# Patient Record
Sex: Male | Born: 1940 | Race: White | Hispanic: No | Marital: Married | Smoking: Former smoker
Health system: Southern US, Community
[De-identification: ages and names within clinical notes are randomized; demographics above are authoritative.]

## PROBLEM LIST (undated history)

## (undated) DIAGNOSIS — F329 Major depressive disorder, single episode, unspecified: Secondary | ICD-10-CM

## (undated) DIAGNOSIS — I6529 Occlusion and stenosis of unspecified carotid artery: Secondary | ICD-10-CM

## (undated) DIAGNOSIS — Z9289 Personal history of other medical treatment: Secondary | ICD-10-CM

## (undated) DIAGNOSIS — R011 Cardiac murmur, unspecified: Secondary | ICD-10-CM

## (undated) DIAGNOSIS — N200 Calculus of kidney: Secondary | ICD-10-CM

## (undated) DIAGNOSIS — S060XAA Concussion with loss of consciousness status unknown, initial encounter: Secondary | ICD-10-CM

## (undated) DIAGNOSIS — K439 Ventral hernia without obstruction or gangrene: Secondary | ICD-10-CM

## (undated) DIAGNOSIS — I480 Paroxysmal atrial fibrillation: Secondary | ICD-10-CM

## (undated) DIAGNOSIS — H9190 Unspecified hearing loss, unspecified ear: Secondary | ICD-10-CM

## (undated) DIAGNOSIS — I10 Essential (primary) hypertension: Secondary | ICD-10-CM

## (undated) DIAGNOSIS — D649 Anemia, unspecified: Secondary | ICD-10-CM

## (undated) DIAGNOSIS — E785 Hyperlipidemia, unspecified: Secondary | ICD-10-CM

## (undated) DIAGNOSIS — M1990 Unspecified osteoarthritis, unspecified site: Secondary | ICD-10-CM

## (undated) DIAGNOSIS — Z973 Presence of spectacles and contact lenses: Secondary | ICD-10-CM

## (undated) DIAGNOSIS — F32A Depression, unspecified: Secondary | ICD-10-CM

## (undated) DIAGNOSIS — K219 Gastro-esophageal reflux disease without esophagitis: Secondary | ICD-10-CM

## (undated) DIAGNOSIS — S060X9A Concussion with loss of consciousness of unspecified duration, initial encounter: Secondary | ICD-10-CM

## (undated) DIAGNOSIS — K649 Unspecified hemorrhoids: Secondary | ICD-10-CM

## (undated) DIAGNOSIS — Z972 Presence of dental prosthetic device (complete) (partial): Secondary | ICD-10-CM

## (undated) DIAGNOSIS — I219 Acute myocardial infarction, unspecified: Secondary | ICD-10-CM

## (undated) DIAGNOSIS — F0390 Unspecified dementia without behavioral disturbance: Secondary | ICD-10-CM

## (undated) DIAGNOSIS — I5032 Chronic diastolic (congestive) heart failure: Secondary | ICD-10-CM

## (undated) DIAGNOSIS — I639 Cerebral infarction, unspecified: Secondary | ICD-10-CM

## (undated) DIAGNOSIS — E039 Hypothyroidism, unspecified: Secondary | ICD-10-CM

## (undated) DIAGNOSIS — I2510 Atherosclerotic heart disease of native coronary artery without angina pectoris: Secondary | ICD-10-CM

## (undated) HISTORY — PX: CATARACT EXTRACTION W/ INTRAOCULAR LENS  IMPLANT, BILATERAL: SHX1307

## (undated) HISTORY — PX: EYE SURGERY: SHX253

## (undated) HISTORY — DX: Paroxysmal atrial fibrillation: I48.0

## (undated) HISTORY — PX: CYST EXCISION: SHX5701

## (undated) HISTORY — PX: COLONOSCOPY: SHX174

## (undated) HISTORY — PX: CYSTECTOMY: SUR359

## (undated) HISTORY — DX: Hyperlipidemia, unspecified: E78.5

## (undated) HISTORY — PX: OTHER SURGICAL HISTORY: SHX169

## (undated) HISTORY — DX: Essential (primary) hypertension: I10

## (undated) HISTORY — PX: VASECTOMY: SHX75

## (undated) HISTORY — PX: POLYPECTOMY: SHX149

## (undated) HISTORY — DX: Chronic diastolic (congestive) heart failure: I50.32

## (undated) HISTORY — DX: Occlusion and stenosis of unspecified carotid artery: I65.29

## (undated) HISTORY — DX: Cerebral infarction, unspecified: I63.9

## (undated) HISTORY — DX: Unspecified osteoarthritis, unspecified site: M19.90

---

## 2000-05-22 ENCOUNTER — Other Ambulatory Visit: Admission: RE | Admit: 2000-05-22 | Discharge: 2000-05-22 | Payer: Self-pay | Admitting: Urology

## 2005-02-06 ENCOUNTER — Emergency Department (HOSPITAL_COMMUNITY): Admission: EM | Admit: 2005-02-06 | Discharge: 2005-02-06 | Payer: Self-pay | Admitting: Emergency Medicine

## 2005-02-20 ENCOUNTER — Emergency Department (HOSPITAL_COMMUNITY): Admission: EM | Admit: 2005-02-20 | Discharge: 2005-02-20 | Payer: Self-pay | Admitting: Emergency Medicine

## 2005-03-08 ENCOUNTER — Ambulatory Visit: Payer: Self-pay | Admitting: Internal Medicine

## 2005-03-26 ENCOUNTER — Ambulatory Visit: Payer: Self-pay | Admitting: Internal Medicine

## 2005-05-03 ENCOUNTER — Ambulatory Visit (HOSPITAL_COMMUNITY): Admission: RE | Admit: 2005-05-03 | Discharge: 2005-05-03 | Payer: Self-pay | Admitting: Urology

## 2005-05-03 ENCOUNTER — Ambulatory Visit (HOSPITAL_BASED_OUTPATIENT_CLINIC_OR_DEPARTMENT_OTHER): Admission: RE | Admit: 2005-05-03 | Discharge: 2005-05-03 | Payer: Self-pay | Admitting: Urology

## 2005-05-03 ENCOUNTER — Encounter (INDEPENDENT_AMBULATORY_CARE_PROVIDER_SITE_OTHER): Payer: Self-pay | Admitting: *Deleted

## 2006-06-17 ENCOUNTER — Encounter: Admission: RE | Admit: 2006-06-17 | Discharge: 2006-06-17 | Payer: Self-pay | Admitting: Nephrology

## 2006-06-17 IMAGING — US US RENAL
1 series · 14 of 25 positions shown · non-contrast
Comparison: None.

CLINICAL DATA: Chronic renal disease.
 RENAL/URINARY TRACT ULTRASOUND:
TECHNIQUE: Complete ultrasound of the urinary tract was performed including evaluation of the kidney, renal collecting systems, and urinary bladder.

[Series 1: unknown · 0.24mm/px · 14 of 33 slices shown]
[im 1/33]
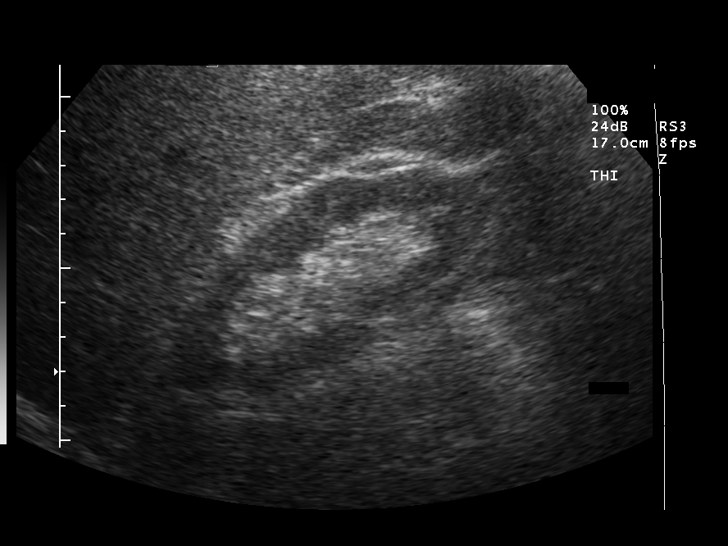
[im 3/33]
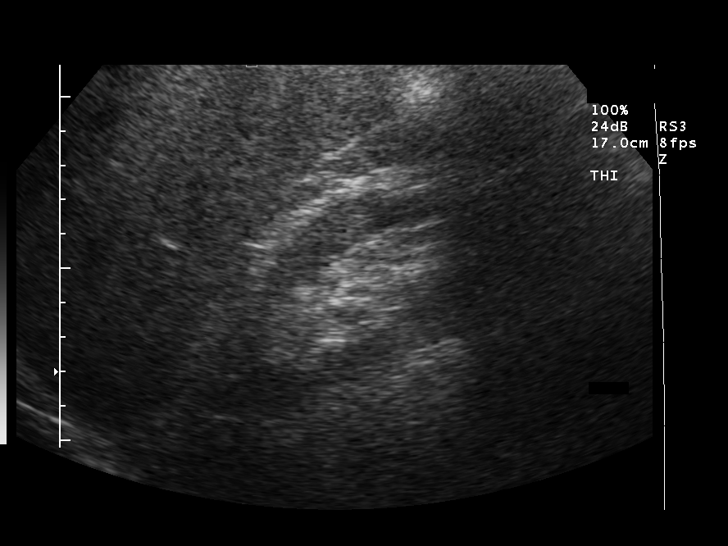
[im 6/33]
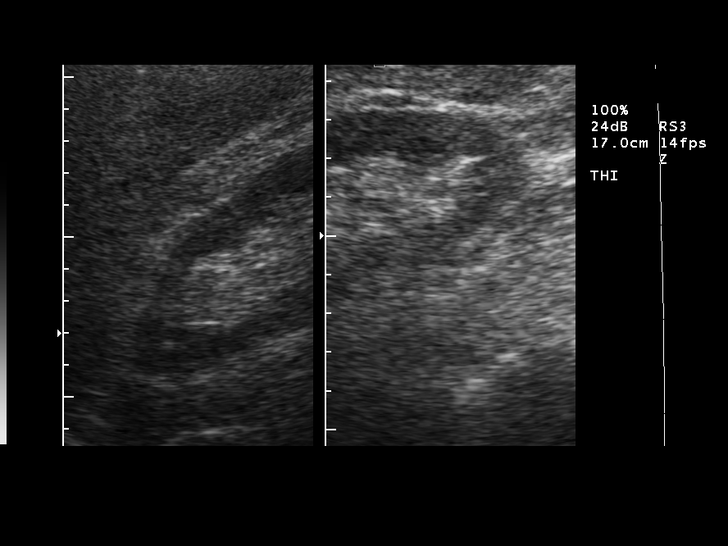
[im 9/33]
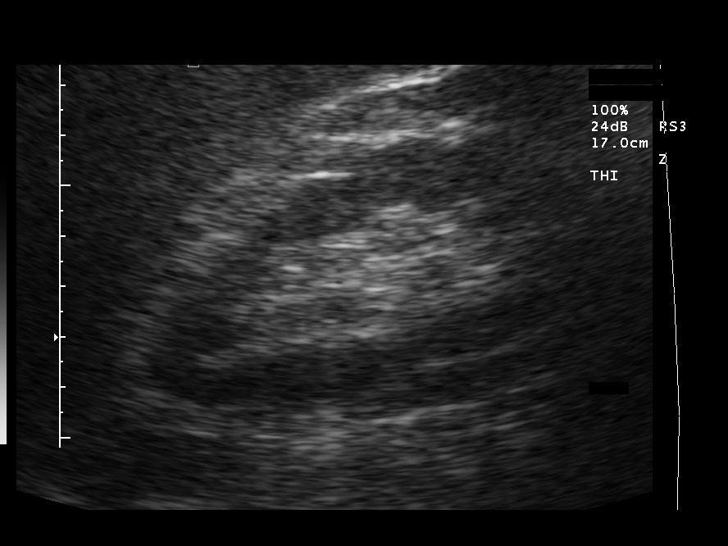
[im 11/33]
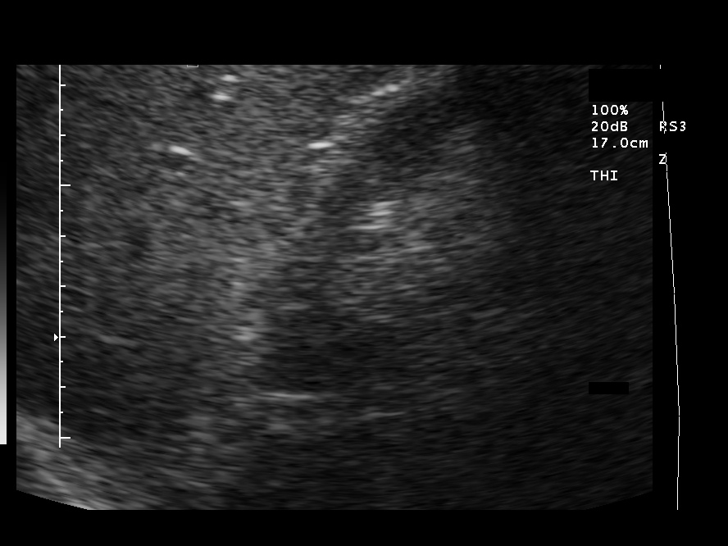
[im 13/33]
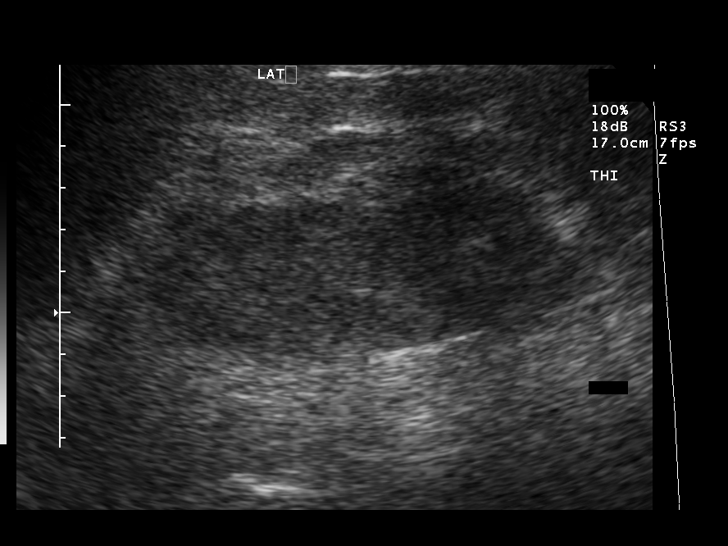
[im 15/33]
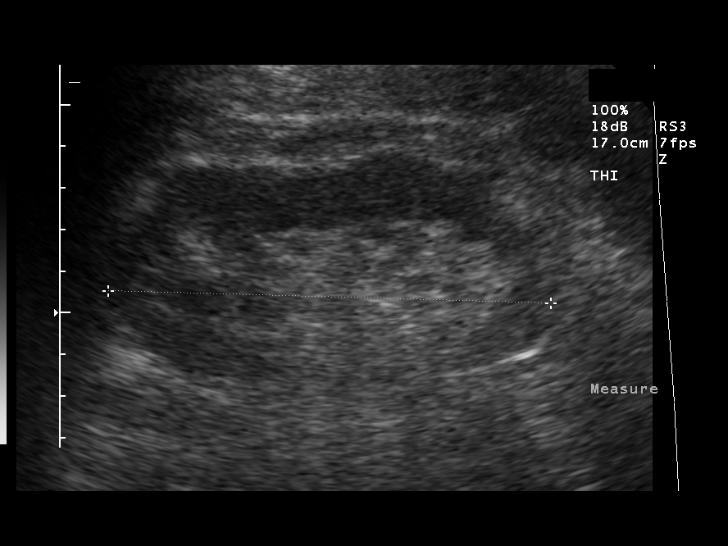
[im 18/33]
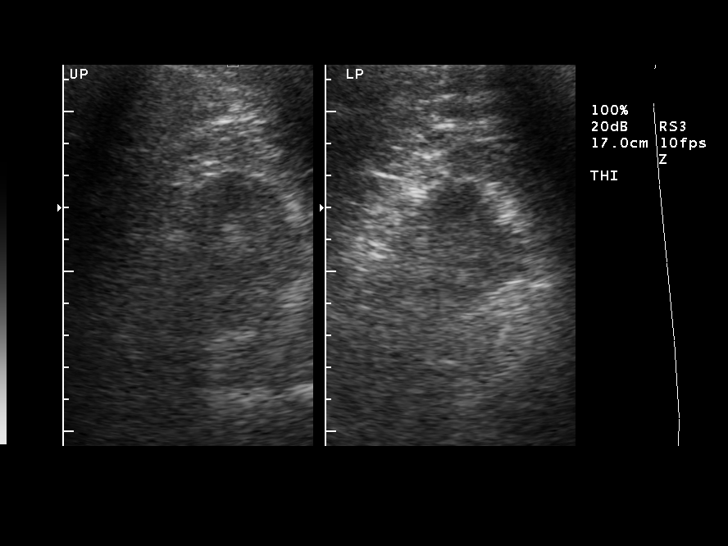
[im 21/33]
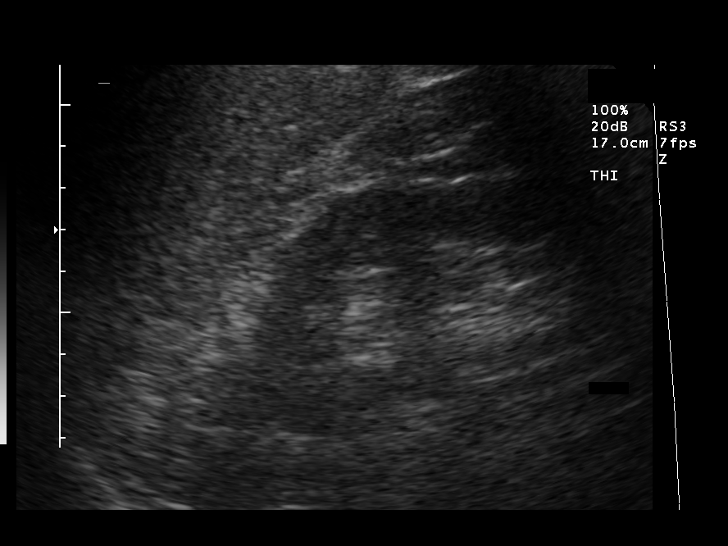
[im 22/33]
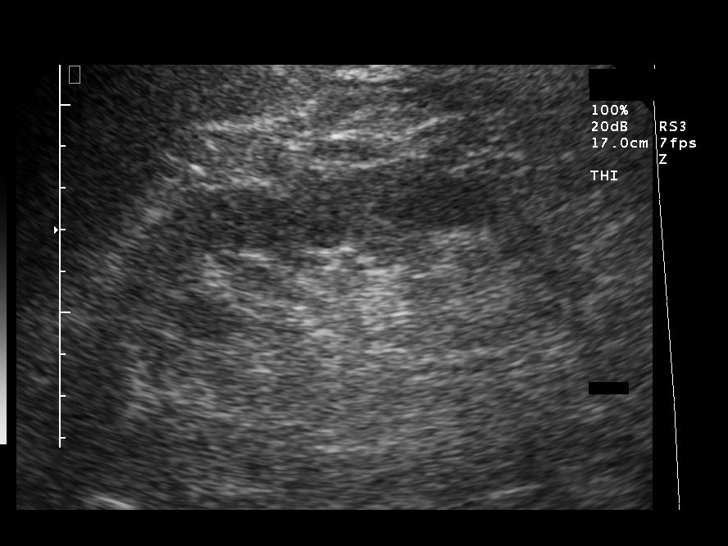
[im 25/33]
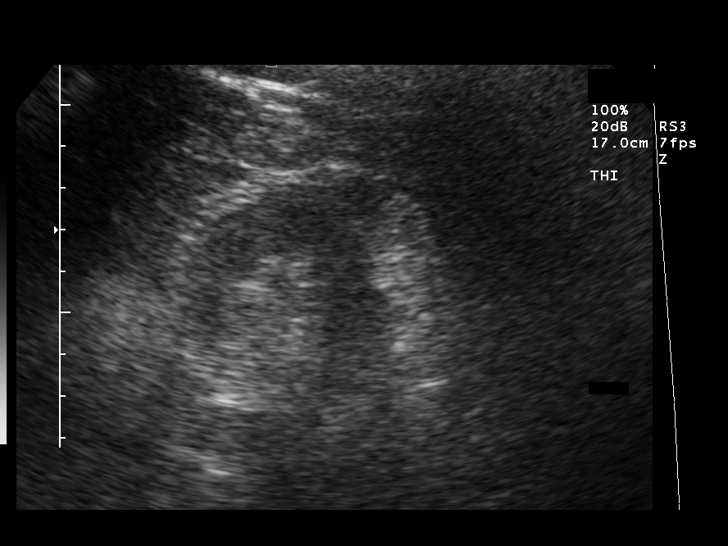
[im 27/33]
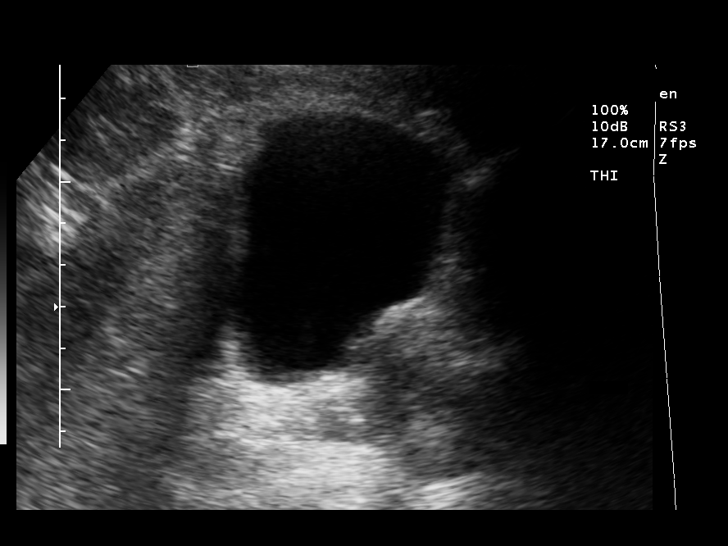
[im 30/33]
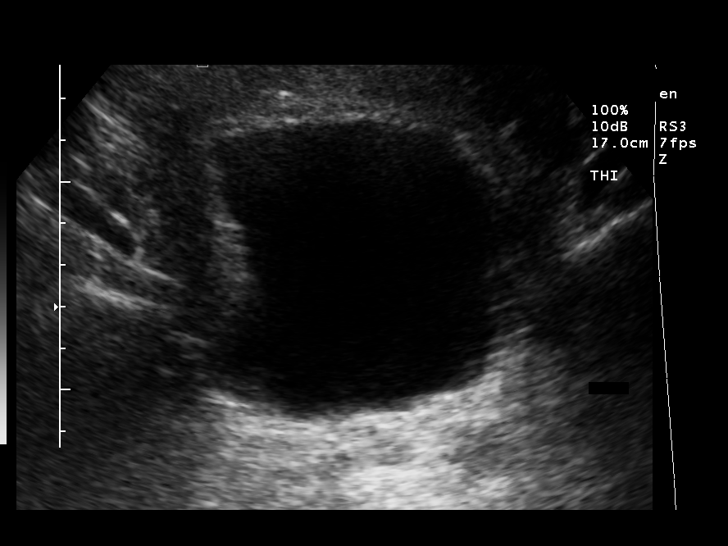
[im 33/33]
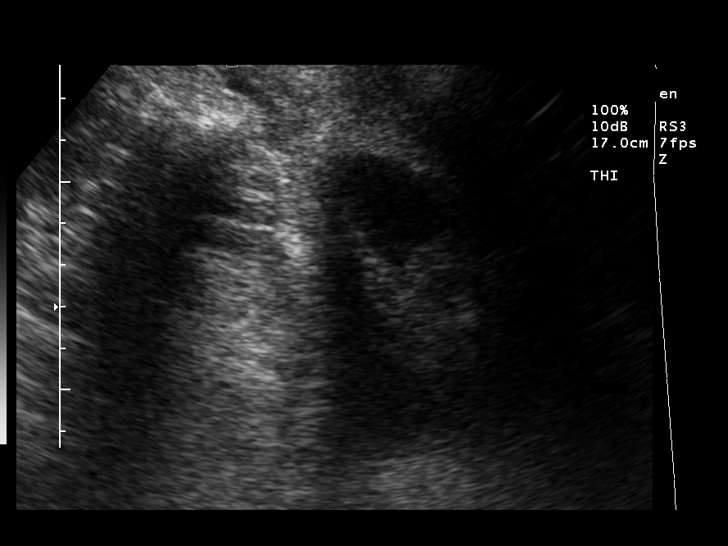

[14 of 25 positions shown; findings below may reference images not displayed]

The right kidney is 10.9 and the left kidney is 10.6 cm.  Normal cortical thickness and echogenicity.  No hydronephrosis.  
 The urinary bladder is normal.
IMPRESSION: Normal renal ultrasound for age.

## 2006-07-19 ENCOUNTER — Encounter: Admission: RE | Admit: 2006-07-19 | Discharge: 2006-07-19 | Payer: Self-pay | Admitting: Nephrology

## 2006-07-19 IMAGING — CR DG BONE SURVEY MET
8 of 10 series · 8 of 10 positions shown · non-contrast
Comparison: none

CLINICAL DATA: Back pain with monoclonal gammopathy.  
BONE SURVEY:

[view not recorded (1 of 8)]
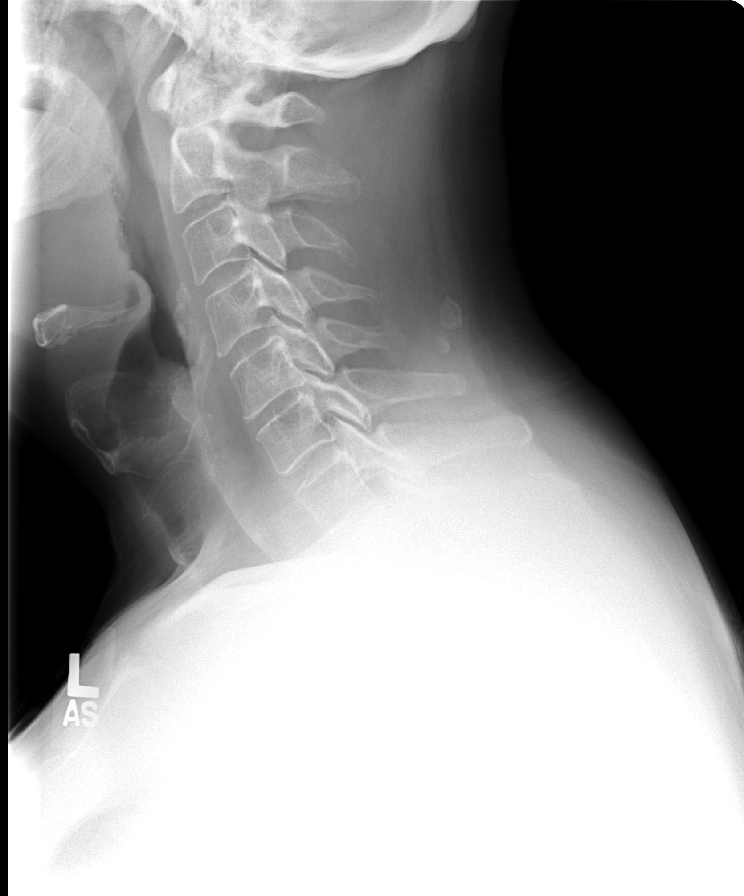

[view not recorded (2 of 8)]
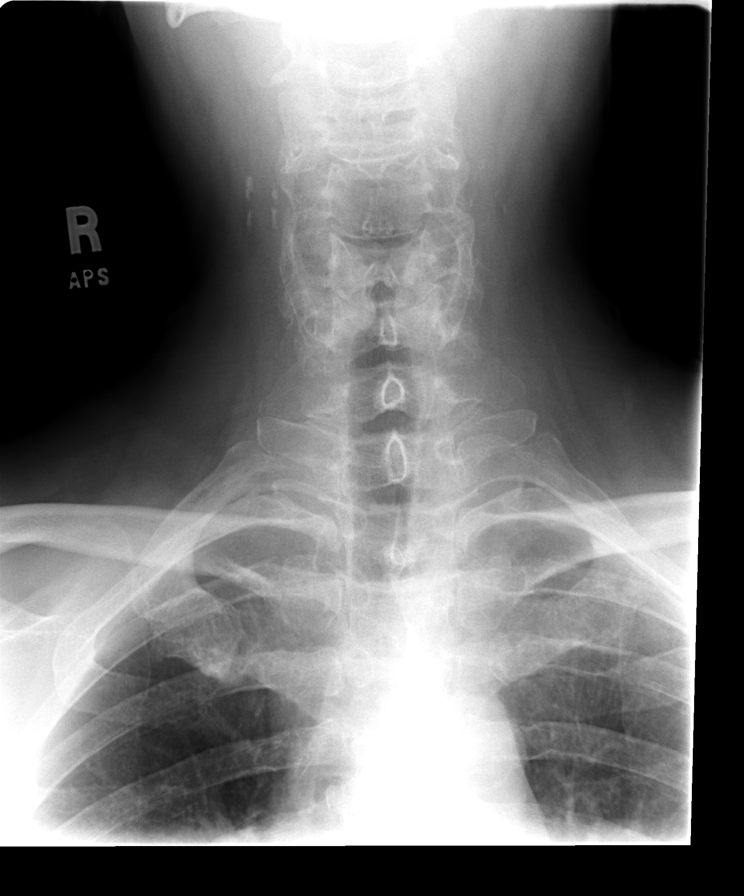

[view not recorded (3 of 8)]
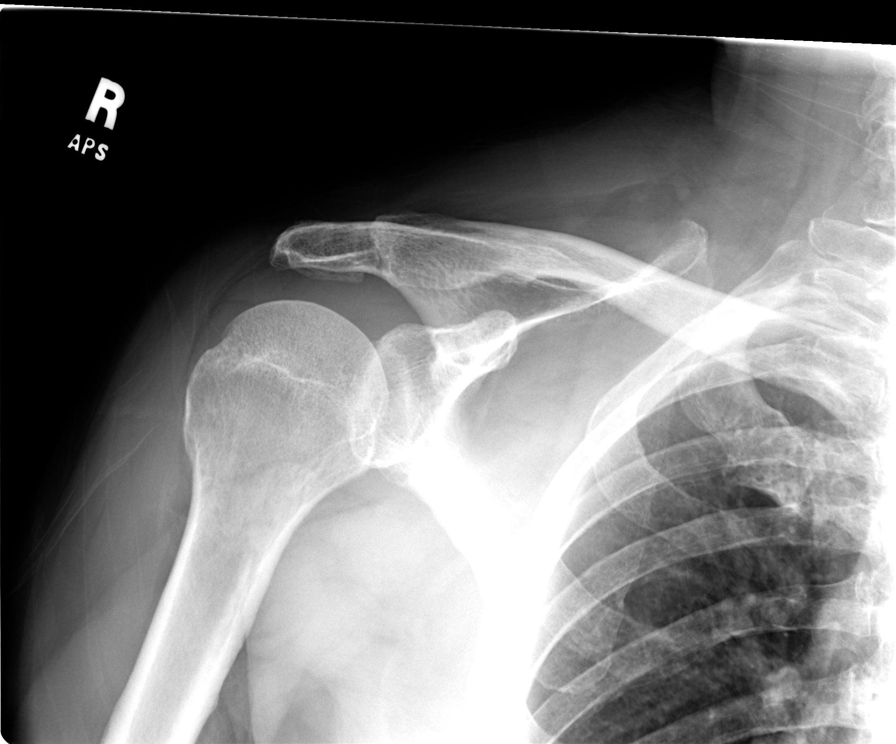

[view not recorded (4 of 8)]
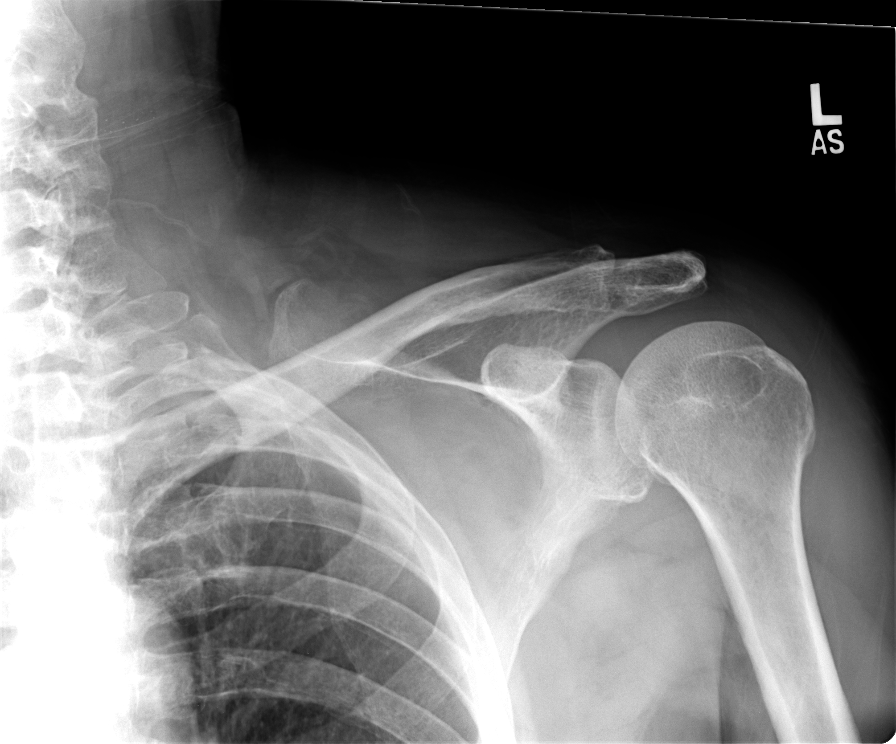

[view not recorded (5 of 8)]
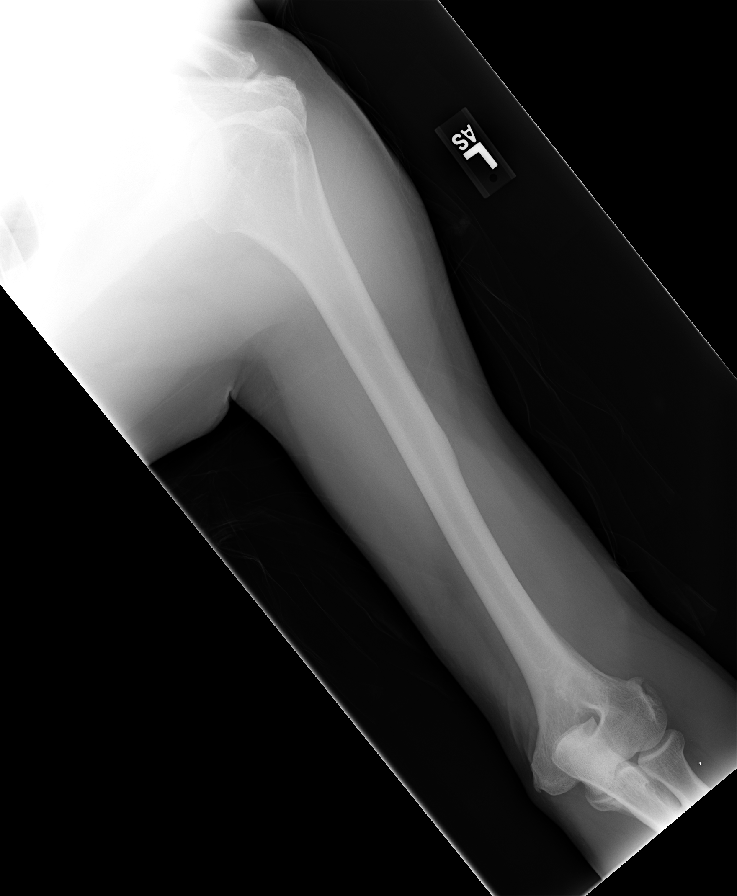

[view not recorded (6 of 8)]
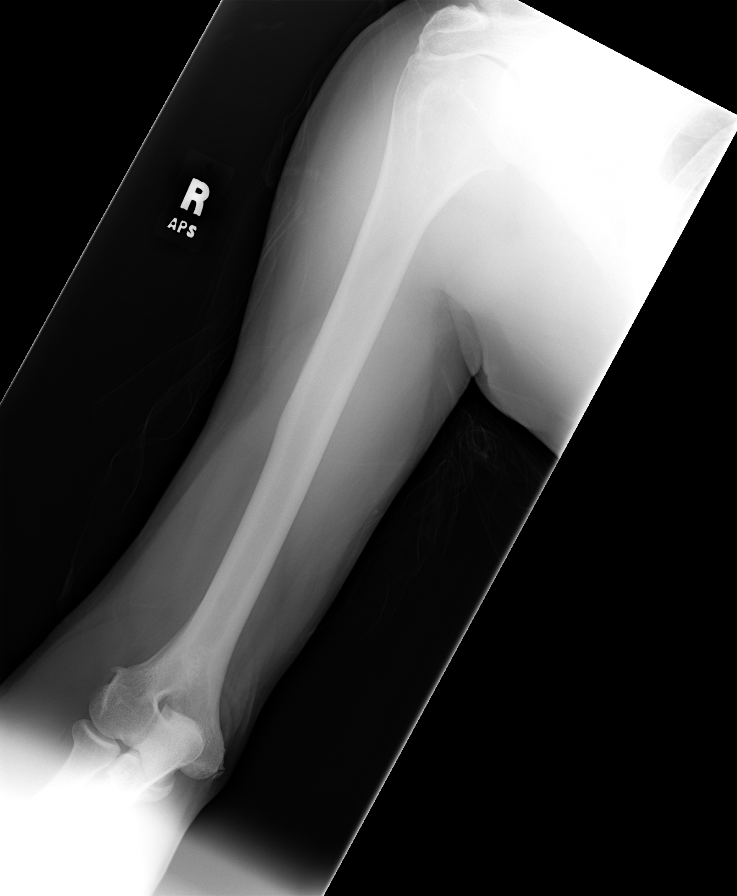

[view not recorded (7 of 8)]
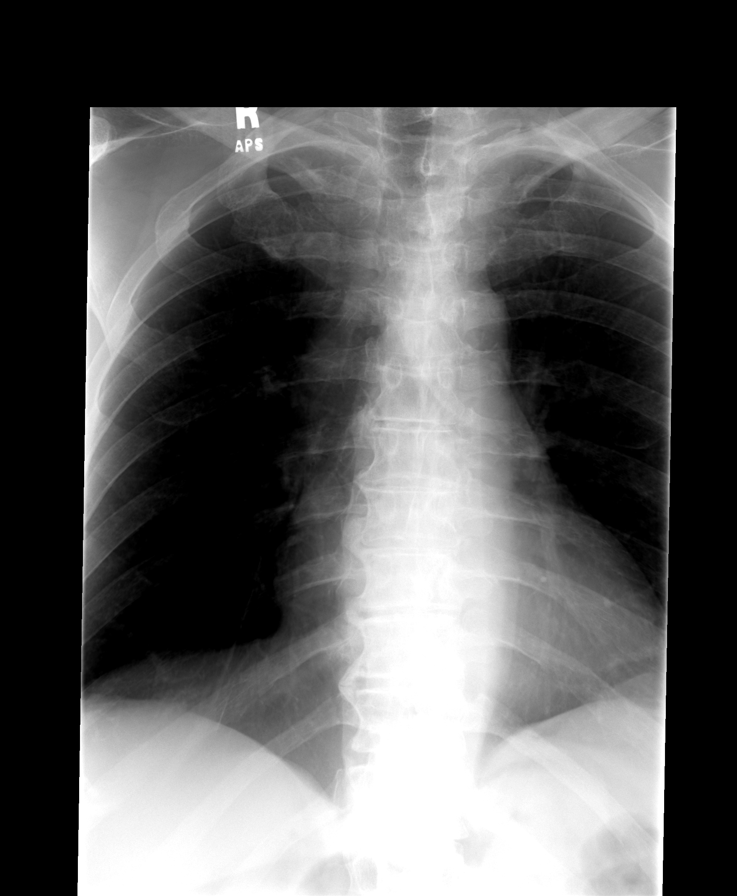

[view not recorded (8 of 8)]
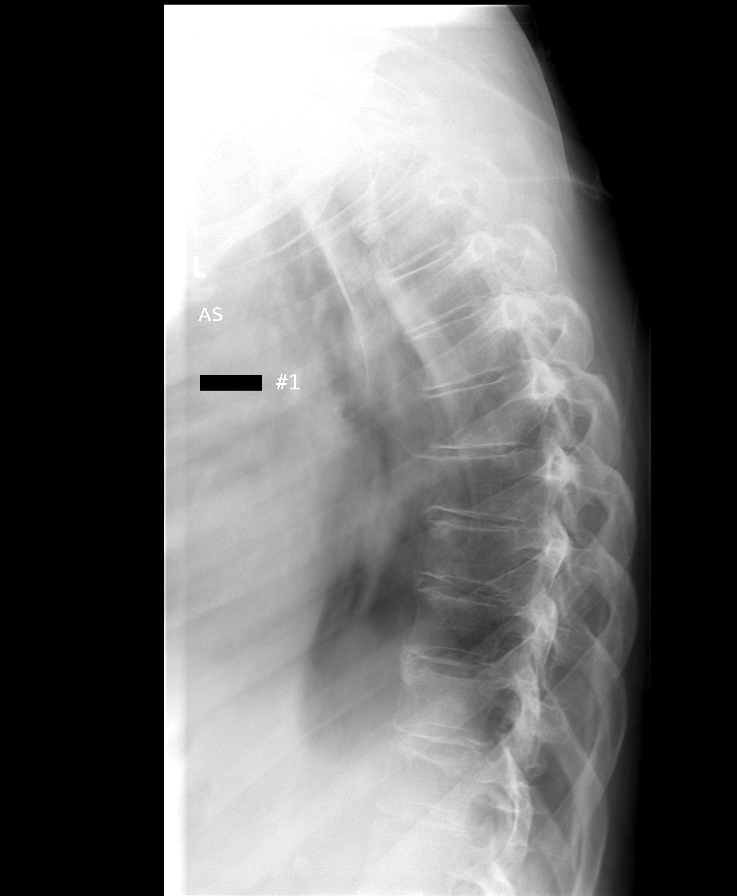

[8 of 10 positions shown; findings below may reference images not displayed]

FINDINGS: The patient is edentulous.  No gross skull abnormalities.  Alignment of the cervical spine is normal with small ossifications in the posterior spinal tissues at C-4 and C-5.  Mild degenerative end plate changes in the thoracic spine with normal alignment.  Marked disc space loss and degenerative end plate changes at L5-S1 along with facet arthropathy.  The bony pelvis is intact.  Mild degenerative changes in the hips.  Normal appearance of the femurs.  Normal appearance of the shoulders and humeri.
IMPRESSION: 1.  No suspicious bone lesions. 
2.   Mild degenerative changes throughout the spine.  No acute bone abnormalities.

## 2009-07-09 HISTORY — PX: PERCUTANEOUS PLACEMENT INTRAVASCULAR STENT CERVICAL CAROTID ARTERY: SUR1019

## 2010-02-06 DIAGNOSIS — I639 Cerebral infarction, unspecified: Secondary | ICD-10-CM

## 2010-02-06 HISTORY — DX: Cerebral infarction, unspecified: I63.9

## 2010-02-09 ENCOUNTER — Ambulatory Visit: Payer: Self-pay | Admitting: Vascular Surgery

## 2010-02-13 ENCOUNTER — Encounter (INDEPENDENT_AMBULATORY_CARE_PROVIDER_SITE_OTHER): Payer: Self-pay | Admitting: *Deleted

## 2010-02-15 IMAGING — CR DG CHEST 2V
2 series · 2 of 2 positions shown · non-contrast
Comparison: None.

CLINICAL DATA: Pre admit for carotid stenosis

CHEST - 2 VIEW

[view not recorded (1 of 2)]
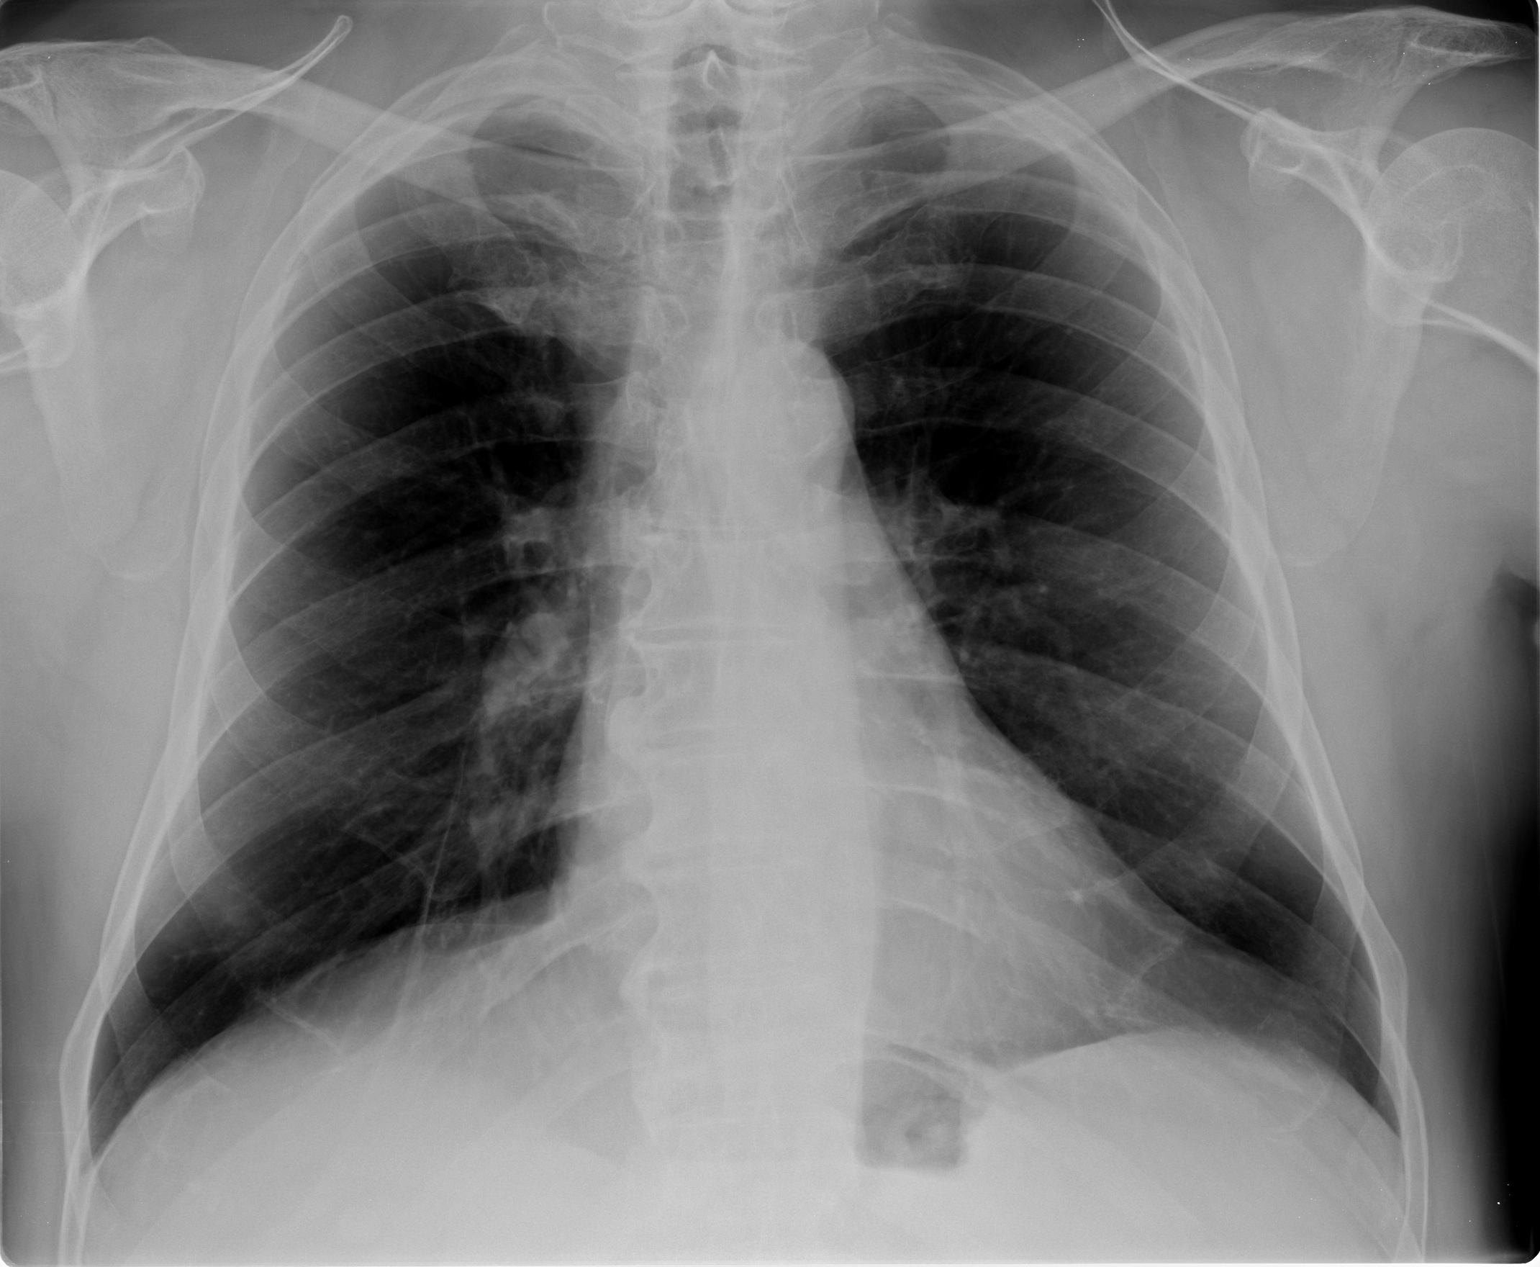

[view not recorded (2 of 2)]
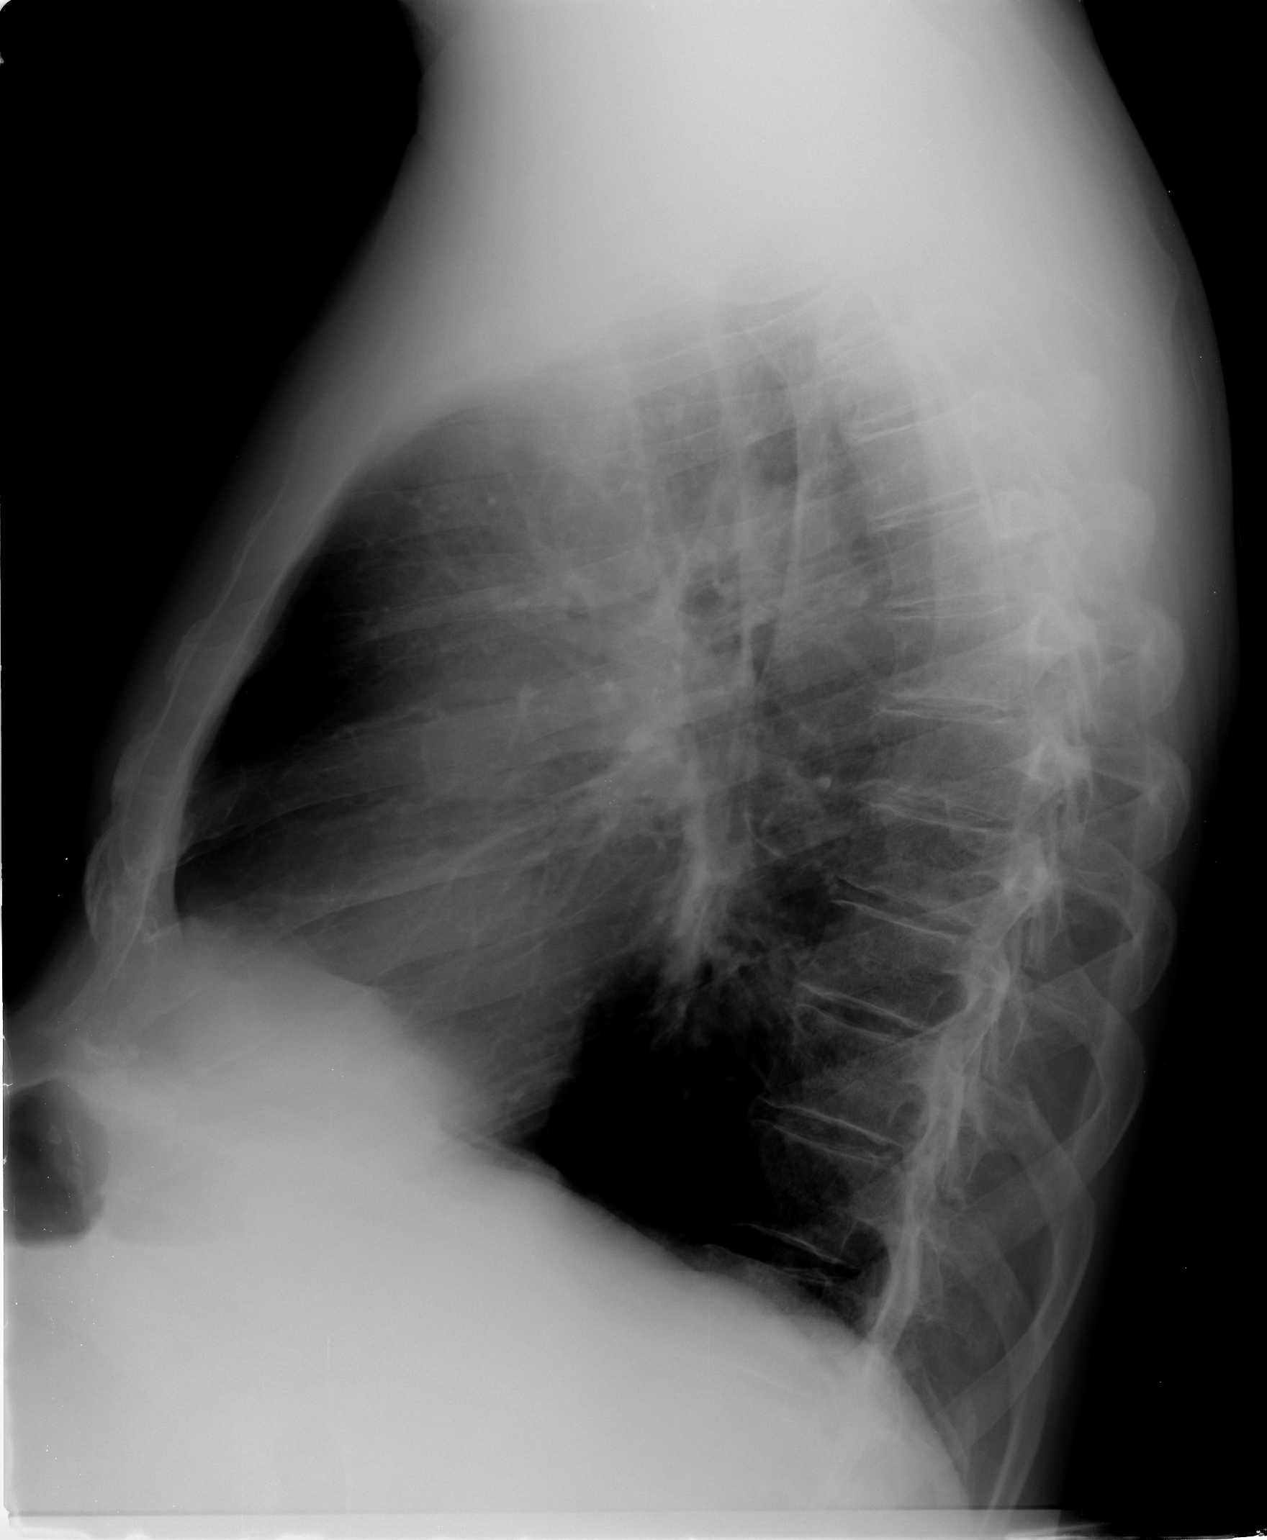

[2 of 2 positions shown; findings below may reference images not displayed]

FINDINGS: Heart and mediastinal contours normal.  Lungs
hyperaerated consistent with COPD.  There is also slight pleural
thickening.  Vertical scarring also noted at the right base.  No
acute findings.  No pleural fluid or osseous lesions.
IMPRESSION: COPD/chronic changes as above.  No active disease.

## 2010-02-17 ENCOUNTER — Inpatient Hospital Stay (HOSPITAL_COMMUNITY): Admission: RE | Admit: 2010-02-17 | Discharge: 2010-02-18 | Payer: Self-pay | Admitting: Vascular Surgery

## 2010-02-17 ENCOUNTER — Encounter: Payer: Self-pay | Admitting: Vascular Surgery

## 2010-02-17 ENCOUNTER — Ambulatory Visit: Payer: Self-pay | Admitting: Vascular Surgery

## 2010-03-08 ENCOUNTER — Encounter: Payer: Self-pay | Admitting: Internal Medicine

## 2010-03-08 ENCOUNTER — Ambulatory Visit: Payer: Self-pay | Admitting: Vascular Surgery

## 2010-08-10 NOTE — Letter (Signed)
Summary: Colonoscopy Letter  [facility]  [place], [place] [address]   Phone: [phone]  Fax: [phone]      February 13, 2010 MRN: [id number]   [place] [name] [address] [place], [place]  [address]   Dear [place] [name],   According to your medical record, it is time for you to schedule a Colonoscopy. The American Cancer Society recommends this procedure as a method to detect early colon cancer. Patients with a family history of colon cancer, or a personal history of colon polyps or inflammatory bowel disease are at increased risk.  This letter has been generated based on the recommendations made at the time of your procedure. If you feel that in your particular situation this may no longer apply, please contact our office.  Please call our office at [phone] to schedule this appointment or to update your records at your earliest convenience.  Thank you for cooperating with [place] to provide you with the very best care possible.   Sincerely,  Lowella Bandy. Olevia Perches, M.D.  [facility] Gastroenterology Division [phone]

## 2010-08-10 NOTE — Letter (Signed)
Summary: [facility]  [facility]   Imported By: Phillis Knack 03/20/2010 13:40:51  _____________________________________________________________________  External Attachment:    Type:   Image     Comment:   External Document

## 2010-09-06 ENCOUNTER — Encounter: Payer: Self-pay | Admitting: Internal Medicine

## 2010-09-06 ENCOUNTER — Other Ambulatory Visit (INDEPENDENT_AMBULATORY_CARE_PROVIDER_SITE_OTHER): Payer: Medicare Other

## 2010-09-06 ENCOUNTER — Ambulatory Visit (INDEPENDENT_AMBULATORY_CARE_PROVIDER_SITE_OTHER): Payer: Medicare Other | Admitting: Vascular Surgery

## 2010-09-06 DIAGNOSIS — I6529 Occlusion and stenosis of unspecified carotid artery: Secondary | ICD-10-CM

## 2010-09-06 DIAGNOSIS — Z48812 Encounter for surgical aftercare following surgery on the circulatory system: Secondary | ICD-10-CM

## 2010-09-07 NOTE — Assessment & Plan Note (Signed)
OFFICE VISIT  CASIMIR, GRISSETT DOB:  Jul 29, 1940                                       09/06/2010 [id number]  I saw the patient in the office today for continued follow-up of his carotid disease.  This is a pleasant 70 year old gentleman who had a left hemispheric TIA and was found have a tight left carotid stenosis. He underwent left carotid endarterectomy with Dacron patch angioplasty in August 2011.  He returns for a 65-month follow-up visit.  Since I saw him last, he has had no new neurologic symptoms.  Specifically he denies any history of stroke, TIAs, expressive or receptive aphasia or amaurosis fugax.  PAST MEDICAL HISTORY:  Significant for hypertension, hypercholesterolemia, both of which are stable on his current medications.  SOCIAL HISTORY:  He is married.  He has 3 children.  He quit tobacco in 2007.  REVIEW OF SYSTEMS:  CARDIOVASCULAR:  He has had no chest pain, chest pressure, palpitations or arrhythmias. PULMONARY:  He has had no productive cough, bronchitis, asthma or wheezing.  PHYSICAL EXAMINATION THIS:  This is a pleasant 70 year old gentleman who appears his stated age.  Blood pressure is 136/64, heart rate is 54, respiratory rate is 12.  Lungs:  Clear bilaterally to auscultation. Cardiovascular:  I do not detect any carotid bruits.  He has a regular rate and rhythm.  He has warm and well-perfused feet.  Abdomen:  Soft and nontender with normal pitched bowel sounds.  Neurologic:  He has no focal weakness or paresthesias.  I did independently interpret his carotid duplex scan today which shows no evidence of recurrent carotid stenosis on the left where he has had surgery.  He has no significant right carotid stenosis.  Both vertebral arteries are patent with normally directed flow.  Overall I am pleased with his progress.  I have ordered follow-up carotid duplex scan in 1 year, and I will see him back at that time.  He knows  to call sooner if he has problems.  In the meantime he knows to continue taking his aspirin.    Judeth Cornfield. Scot Dock, M.D. Electronically Signed  CSD/MEDQ  D:  09/06/2010  T:  09/07/2010  Job:  [id number]  cc:   Modena Jansky. Marisue Humble, M.D. Lowella Bandy. Olevia Perches, MD

## 2010-09-13 NOTE — Procedures (Unsigned)
CAROTID DUPLEX EXAM  INDICATION:  Follow up carotid artery disease.  HISTORY: Diabetes:  No. Cardiac:  Atrial fibrillation. Hypertension:  Yes. Smoking:  Previous. Previous Surgery:  Left carotid endarterectomy with Dacron patch, 02/17/2010. CV History:  Patient is currently asymptomatic. Amaurosis Fugax No, Paresthesias No, Hemiparesis No.                                      RIGHT             LEFT Brachial systolic pressure:         148               146 Brachial Doppler waveforms:         WNL               WNL Vertebral direction of flow:        Antegrade         Antegrade DUPLEX VELOCITIES (cm/sec) CCA peak systolic                   71                84 ECA peak systolic                   109               [id number] ICA peak systolic                   80                69 ICA end diastolic                   23                16 PLAQUE MORPHOLOGY:                  Heterogenous PLAQUE AMOUNT:                      Mild-to-moderate PLAQUE LOCATION:                    ICA  IMPRESSION: 1. Bilaterally no hemodynamically significant stenosis. 2. Patent left carotid endarterectomy.     ___________________________________________ Judeth Cornfield. Scot Dock, M.D.  OD/MEDQ  D:  09/06/2010  T:  09/06/2010  Job:  [id number]

## 2010-09-19 NOTE — Letter (Signed)
Summary: Angelia Mould MD/V V Salley Slaughter MD/V V S   Imported By: Bubba Hales 09/14/2010 08:19:42  _____________________________________________________________________  External Attachment:    Type:   Image     Comment:   External Document

## 2010-09-22 LAB — CBC
HCT: 34.6 % — ABNORMAL LOW (ref 39.0–52.0)
Hemoglobin: 11.6 g/dL — ABNORMAL LOW (ref 13.0–17.0)
Hemoglobin: 9 g/dL — ABNORMAL LOW (ref 13.0–17.0)
MCH: 30.5 pg (ref 26.0–34.0)
MCHC: 33.5 g/dL (ref 30.0–36.0)
MCV: 94.6 fL (ref 78.0–100.0)
RBC: 2.95 MIL/uL — ABNORMAL LOW (ref 4.22–5.81)
WBC: 6 10*3/uL (ref 4.0–10.5)

## 2010-09-22 LAB — COMPREHENSIVE METABOLIC PANEL
AST: 27 U/L (ref 0–37)
BUN: 50 mg/dL — ABNORMAL HIGH (ref 6–23)
CO2: 23 mEq/L (ref 19–32)
Chloride: 104 mEq/L (ref 96–112)
Creatinine, Ser: 3.14 mg/dL — ABNORMAL HIGH (ref 0.4–1.5)

## 2010-09-22 LAB — URINALYSIS, ROUTINE W REFLEX MICROSCOPIC
Protein, ur: NEGATIVE mg/dL
Urobilinogen, UA: 0.2 mg/dL (ref 0.0–1.0)
pH: 5 (ref 5.0–8.0)

## 2010-09-22 LAB — TYPE AND SCREEN
ABO/RH(D): B POS
Antibody Screen: NEGATIVE

## 2010-09-22 LAB — PROTIME-INR
INR: 1.09 (ref 0.00–1.49)
Prothrombin Time: 14.3 seconds (ref 11.6–15.2)

## 2010-09-22 LAB — SURGICAL PCR SCREEN: Staphylococcus aureus: NEGATIVE

## 2010-09-22 LAB — BASIC METABOLIC PANEL
Calcium: 8.4 mg/dL (ref 8.4–10.5)
Sodium: 138 mEq/L (ref 135–145)

## 2010-09-22 LAB — ABO/RH: ABO/RH(D): B POS

## 2010-10-30 ENCOUNTER — Other Ambulatory Visit: Payer: Self-pay | Admitting: Internal Medicine

## 2010-10-30 ENCOUNTER — Telehealth: Payer: Self-pay | Admitting: *Deleted

## 2010-10-30 ENCOUNTER — Other Ambulatory Visit: Payer: Self-pay | Admitting: *Deleted

## 2010-10-30 ENCOUNTER — Encounter: Payer: Self-pay | Admitting: *Deleted

## 2010-10-30 ENCOUNTER — Encounter: Payer: Self-pay | Admitting: Internal Medicine

## 2010-10-30 DIAGNOSIS — Z86010 Personal history of colonic polyps: Secondary | ICD-10-CM

## 2010-10-30 NOTE — Telephone Encounter (Signed)
Per Nicoletta Ba, PA patient needs to schedule colonoscopy direct with Dr. Olevia Perches. Called and left a message for patient to call me.

## 2010-10-30 NOTE — Telephone Encounter (Signed)
Patient returned my called. Scheduled direct colonoscopy on 12/20/10 at 9:00 AM with 8:00 AM arrival. Pre visit on 12/16/10 at 9:00 AM. Letter sent to patient.

## 2010-11-21 NOTE — Consult Note (Signed)
NEW PATIENT CONSULTATION   [place] [name], [place] [name]  DOB:  [birth year]                                       02/09/2010  [id number]   I saw the patient in the office today in consultation concerning a left  carotid stenosis.  This is a pleasant 70 year old right-handed gentleman  who last week developed the sudden onset of weakness in the right upper  extremity and right lower extremity which lasted for several minutes and  then resolved completely.  Of note, he is followed by Dr. Marisue Humble and  Dr. Marisue Humble had actually picked up a loud left carotid bruit which  prompted a carotid duplex scan which showed a greater than 80% left  carotid stenosis.  He was sent for evaluation for possible left carotid  endarterectomy.  The patient is right-handed.  He had no previous  history of stroke or TIAs.  He has had no history of expressive or  receptive aphasia.  He did have a visual field cut on the right a year  ago which has been persistent.  He has had no further episodes since his  episode a week ago with the transient right-sided weakness.   His past medical history is significant for hypertension and  hypercholesterolemia, both of which are followed closely by Dr. Marisue Humble.  He denies any history of diabetes, history of previous myocardial  infarction, history of congestive heart failure or history of COPD.   SOCIAL HISTORY:  He is married.  He has two children.  He is retired.  He quit tobacco 3-4 years ago.   FAMILY HISTORY:  His father died at age 41 with a myocardial infarction.  He is unaware of any history of premature cardiovascular disease.   ALLERGIES:  Tetanus, penicillin and sulfa.   MEDICATIONS:  1. Labetalol 100 mg 2 tablets p.o. b.i.d.  2. Amlodipine 10 mg p.o. daily.  3. Fenofibrate 160 mg p.o. daily.  4. Pravastatin 40 mg p.o. daily.  5. Sertraline 50 mg p.o. daily.  6. Lisinopril 40 mg p.o. daily.  7. Lasix 40 mg p.o. b.i.d.  8. Aspirin 325 mg  p.o. daily.  9. Cinnamon 1000 mg p.o. daily.   REVIEW OF SYSTEMS:  GENERAL:  He has had no recent weight loss, weight  gain or problems with his appetite.  CARDIOVASCULAR:  He has had no chest pain, chest pressure, palpitations  or arrhythmias.  He does admit to dyspnea on exertion.  He has had no  history of claudication, rest pain or nonhealing ulcers.  He has had no  history of DVT or phlebitis.  GI:  He does have a history of hiatal hernia and occasional diarrhea.  NEUROLOGIC:  He has had occasional dizziness.  URINARY:  He has had some urinary frequency.  ENT:  He has had some change in his eyesight recently.  MUSCULOSKELETAL:  He does have a history of arthritis and muscle pain.  PSYCHIATRIC:  He has had a history of depression and anxiety.  Pulmonary, hematologic and integumentary review of systems is  unremarkable and is documented on the medical history form in his chart.   PHYSICAL EXAMINATION:  General:  This is a pleasant 70 year old  gentleman who appears his stated age.  Vital signs:  His blood pressure  is 158/71, heart rate is 64, respiratory rate is 16.  HEENT:  Unremarkable.  Lungs:  Are clear bilaterally without rales, rhonchi or  wheezing.  Cardiovascular:  He has a left carotid bruit and a softer  right carotid bruit.  He has a regular rate and rhythm.  He has palpable  femoral pulses and palpable pedal pulses bilaterally.  He has no  significant lower extremity swelling.  Abdomen:  Soft and nontender with  normal pitched bowel sounds.  Musculoskeletal:  There are no major  deformities or cyanosis.  Neurological:  He has no focal weakness or  paresthesias.  Skin:  There are no ulcers or rashes.   I have ordered and independently interpreted his carotid duplex scan  which shows a critical left carotid stenosis.  Peak systolic velocity  Q000111Q cm/sec with an end diastolic velocity of 123XX123 cm/sec.  He has a 40%  to 59% right carotid stenosis.  Both vertebral arteries  are patent with  normally directed flow.   I have also reviewed his labs which show a total cholesterol 224,  triglycerides 594.   His creatinine is 3.16.   Given the severity of his left carotid stenosis with a recent left  hemispheric TIA I have recommended left carotid endarterectomy in order  to lower his risk of future stroke.  We have discussed the indications  for surgery and the potential complications including but not limited to  bleeding, wound healing problems, stroke (perioperative risk 1%-2%),  nerve injury, MI or other unpredictable medical problems.  All of his  questions were answered and he is agreeable to proceed.  His surgery has  been scheduled for 02/17/2010.  He does know to continue his aspirin  right up through surgery.     Judeth Cornfield. Scot Dock, M.D.  Electronically Signed   CSD/MEDQ  D:  02/05/2010  T:  02/10/2010  Job:  [id number]   cc:   Modena Jansky. Marisue Humble, M.D.

## 2010-11-21 NOTE — Procedures (Signed)
CAROTID DUPLEX EXAM   INDICATION:  Followup known left carotid artery disease.   HISTORY:  Diabetes:  No.  Cardiac:  Atrial fibrillation.  Hypertension:  Yes.  Smoking:  Previous.  Previous Surgery:  No.  CV History:  CVA.  Amaurosis Fugax Yes, Paresthesias No, Hemiparesis No                                       RIGHT             LEFT  Brachial systolic pressure:         150               152  Brachial Doppler waveforms:         WNL               WNL  Vertebral direction of flow:        Antegrade         Antegrade  DUPLEX VELOCITIES (cm/sec)  CCA peak systolic                   111               [id number]  ECA peak systolic                   164               123XX123  ICA peak systolic                   125               Q000111Q  ICA end diastolic                   37                237  PLAQUE MORPHOLOGY:                  Heterogeneous     Heterogeneous  PLAQUE AMOUNT:                      ICA               Severe  PLAQUE LOCATION:                    ICA               ICA, ECA   IMPRESSION:  1. Right internal carotid artery suggests 40% to 59% stenosis.  2. Left internal carotid artery suggests 80% to 99% stenosis.  3. Bilateral external carotid artery stenosis.  4. Antegrade flow in bilateral vertebrals.   ___________________________________________  Judeth Cornfield. Scot Dock, M.D.   CB/MEDQ  D:  02/09/2010  T:  02/10/2010  Job:  [id number]

## 2010-11-21 NOTE — Assessment & Plan Note (Signed)
OFFICE VISIT   CODA, CHOTO  DOB:  [birth year]                                       03/08/2010  [id number]   I saw the patient in the office today for follow-up after his recent  left carotid endarterectomy.  This is a pleasant 70 year old gentleman  who had a transient episode of right upper extremity weakness and right  lower extremity weakness.  Duplex scan showed a critical left carotid  stenosis.  He underwent left carotid endarterectomy with Dacron patch  angioplasty on February 17, 2010.  He did well postoperatively and was  discharged on postoperative day #1.  He comes in for a routine follow-up  visit.  Overall has been doing quite well and has no specific  complaints.  He has had no focal weakness or paresthesias.  He continues  to take his aspirin.   PHYSICAL EXAMINATION:  Blood pressure is 158/79, heart rate is 56,  temperature is 97.8.  Neck incision is healing nicely.  He has no focal  weakness.   Overall I am pleased with his progress.  Preoperative carotid duplex  scan showed a moderate 40%-59% right carotid stenosis.  I have  recommended follow-up duplex scan in 6 months.  If the stenosis remains  stable then we can follow him in 1 year intervals.  He does know to  continue taking his aspirin.  In addition he apparently needs to have a  colonoscopy and I feel it would be safe to proceed in January of the  coming year.  He did have a Dacron patch placed so I prefer to delay the  colonoscopy if possible for several months.  He will need prophylactic  antibiotics prior to the procedure.   I plan on seeing him back in 6 months.  He knows to call sooner if he  has problems.     Judeth Cornfield. Scot Dock, M.D.  Electronically Signed   CSD/MEDQ  D:  03/08/2010  T:  03/09/2010  Job:  [id number]   cc:   Modena Jansky. Marisue Humble, M.D.  Lowella Bandy. Olevia Perches, MD

## 2010-11-24 NOTE — Op Note (Signed)
NAMEZEEK, HIDROGO                 ACCOUNT NO.:  [id number]   MEDICAL RECORD NO.:  [id number]          PATIENT TYPE:  AMB   LOCATION:  [facility]                         FACILITY:  [facility]   PHYSICIAN:  Marshall Cork. Jeffie Pollock, M.D.    DATE OF BIRTH:  Mar 16, 1941   DATE OF PROCEDURE:  05/03/2005  DATE OF DISCHARGE:                                 OPERATIVE REPORT   PREOPERATIVE DIAGNOSIS:  Right epididymal mass.   POSTOPERATIVE DIAGNOSIS:  Right epididymal mass.   PROCEDURE:  Removal of right epididymal tail with intraoperative frozen  section.   SURGEON:  Dr. Irine Seal   ASSISTANT:  Dr. Guillermina City   ANESTHESIA:  General endotracheal.   SPECIMEN:  Right epididymal detail including epididymal mass.   PROCEDURE:  The patient was identified by his wrist bracelet and brought to  room 4, where he received preprocedure antibiotics as well as general  anesthesia.  He was prepped and draped in usual sterile fashion, taking care  to minimize peripheral neuropathy and a compartment syndrome.  Next we made  a 3 cm transverse incision over his right hemiscrotum.  We dissected down  through the dartos fiber sharply, delivered the right testis and cord into  the operative field.  Next we picked up on the tunica vaginalis and divided  it sharply in between forceps, exposing the anterior surface of the  testicle.  Next the tunica vaginalis was opened over the anterior testicle  in a vertical fashion with Bovie cautery.  Next we everted the tunica  vaginalis posteriorly to the testis, exposing the testis.  Over the inferior  pole of the epididymis, we appreciated a 1 cm spherical mass.  The tunica  vaginalis over this area of the testis was adherent to the tail of the  epididymis and the lower pole of the testis.  Next using Bovie cautery and  sharp dissection, we dissected the tunica vaginalis off of the lower pole of  the testis and epididymal tail.  Next we identified the vas deferens coming  off of  the tail epididymis and isolated at approximately 2 cm away from the  distal end of the tail.  We ligated it with 2-0 Vicryl ties and divided it  sharply.  Next, we dissected the space we dissected the epididymis out  approximately 1 cm superior to the mass and passed a right-angle clamp  between the epididymis and the testis.  We then ligated the epididymis with  2-0 Vicryl ties and divided it sharply.  Next coming in from the ligated  epididymis ligated vas, we sharply dissected the remaining epididymal tail  and mass off of the lower pole of the testis.  This was done with Bovie  cautery and Metzenbaum scissors.  This was done its entirety without opening  to the tunica albuginea of the testis.  Next the specimen was passed off the  field for frozen analysis, returned a diagnosis of inflammatory change  around a nidus of calcified cyst.  There is no evidence of neoplasm.  Next  we ensured our operative bed was hemostatic.  Finally,  we oversewed the  inferior pole of the testis with several figure-of-eight stitches consisting  of 3-0 chromic.  Next we delivered the testis back in the field using the  lateral sulcus as a landmark, ensuring there was no torsion of the cord or  testis.  Next we closed the scrotum in two layers.  We closed the dartos with a running 3-0 chromic and then closed the skin  with interrupted 3-0 chromic vertical mattress sutures.  Dressings were  applied.  The patient was reversed from his anesthesia which he tolerated  without complication.  Please note Dr. Irine Seal was present and  participated in all aspects of case.     ______________________________  Guillermina City, MD      Marshall Cork. Jeffie Pollock, M.D.  Electronically Signed    MT/MEDQ  D:  05/03/2005  T:  05/03/2005  Job:  [id number]

## 2010-11-24 NOTE — Op Note (Signed)
NAMECOLLIS, FLEMING                 ACCOUNT NO.:  [id number]   MEDICAL RECORD NO.:  [id number]          PATIENT TYPE:  AMB   LOCATION:  [facility]                         FACILITY:  [facility]   PHYSICIAN:  Marshall Cork. Jeffie Pollock, M.D.    DATE OF BIRTH:  [birth year]   DATE OF PROCEDURE:  05/03/2005  DATE OF DISCHARGE:  05/03/2005                                 OPERATIVE REPORT   PROCEDURE:  Right scrotal exploration with resection of epididymal  mass/partial epididymectomy.   PREOPERATIVE DIAGNOSIS:  Solid right epididymal mass.   POSTOPERATIVE DIAGNOSIS:  Solid right epididymal mass.   SURGEON:  Dr. Irine Seal.   ASSISTANT:  Dr. Guillermina City.   ANESTHESIA:  General.   SPECIMENS:  Epididymal mass. Frozen section consistent with inflammatory  changes.   COMPLICATIONS:  None.   INDICATIONS:  Mr. [place] [name] is a 70 year old white male who was sent in  consultation by Dr. Redmond Pulling for a mass in the right scrotum which on  examination was felt to be intrinsic to the lower pole of the epididymis and  on ultrasound was found to be solid. It was felt that resection of the mass  was indicated to rule out neoplastic versus inflammatory lesion.   FINDINGS AND PROCEDURE:  The patient was taken to the operating room where a  general anesthetic was induced. He was placed supine, his scrotum was  clipped on the right. He was then prepped with Betadine solution and draped  in the usual sterile fashion. An oblique right scrotal incision was made  along the skin lines. The testicle within the tunica vaginalis was delivered  from the wound. The tunica vaginalis was opened. There were some  inflammatory changes with adherence of the tunica vaginalis to the mass of  the lower pole of the epididymis. The mass was dissected out, the vas was  divided and the epididymis was transected at mid point. Both of these  structures were tied with #0 Vicryl ties. The mass was then dissected off  the lower pole of the testicle using  a needle point Bovie. The vascular  pedicle of the epididymis was identified and once again tied off using #0  Vicryl ties. The mass was then removed and sent for frozen section. The bed  of the epididymis over the lower pole of the testicle was then oversewn with  a running 3-0 chromic.   The frozen section returned consistent with an inflammatory lesion, likely  related to an epididymal blowout associated with the patient's prior  vasectomy.   The testicle was then delivered back into the right hemiscrotum, the dartos  was closed using a running 3-0 chromic stitch, the skin was closed with  interrupted vertical mattress 3-0 chromic sutures. A 0.25% Marcaine was  injected in the incision for postoperative pain control, a dressing was  applied followed by scrotal support. The patient's anesthetic was reversed.  He was removed to the recovery room in stable condition and there were no  complications.      Marshall Cork. Jeffie Pollock, M.D.  Electronically Signed     JJW/MEDQ  D:  05/08/2005  T:  05/08/2005  Job:  [id number]   cc:   Luna Kitchens. Redmond Pulling, M.D.  Fax: [phone]

## 2010-12-13 ENCOUNTER — Ambulatory Visit (AMBULATORY_SURGERY_CENTER): Payer: Medicare Other | Admitting: *Deleted

## 2010-12-13 VITALS — Ht 68.0 in | Wt 213.0 lb

## 2010-12-13 DIAGNOSIS — Z86010 Personal history of colonic polyps: Secondary | ICD-10-CM

## 2010-12-13 MED ORDER — PEG-KCL-NACL-NASULF-NA ASC-C 100 G PO SOLR: ORAL | Status: DC

## 2010-12-18 ENCOUNTER — Telehealth: Payer: Self-pay | Admitting: *Deleted

## 2010-12-18 NOTE — Telephone Encounter (Signed)
[facility] notified of need for prophylactic antibiotics.[place] [name]

## 2010-12-18 NOTE — Telephone Encounter (Signed)
Message copied by Ivar Bury ANN on Mon Dec 18, 2010  7:45 AM ------      Message from: Lafayette Dragon      Created: Wed Dec 13, 2010  6:15 PM      Regarding: RE: Prophylactic antibiotics       Please give Unasyn 1.5 gm IV 1 hr prior to the colonoscopy x 1 dose, D.Olevia Perches      ----- Message -----         From: Emerson Monte, RN         Sent: 12/13/2010   9:41 AM           To: Lafayette Dragon, MD      Subject: Prophylactic antibiotics                                 [facility] needs an order for [place] [name] prophylactic antibiotics prior to procedure.  Thank you, Emerson Monte

## 2010-12-20 ENCOUNTER — Ambulatory Visit (AMBULATORY_SURGERY_CENTER): Payer: Medicare Other | Admitting: Internal Medicine

## 2010-12-20 ENCOUNTER — Encounter: Payer: Self-pay | Admitting: Internal Medicine

## 2010-12-20 VITALS — BP 159/70 | HR 56 | Temp 97.0°F | Resp 18 | Ht 68.0 in | Wt 209.0 lb

## 2010-12-20 DIAGNOSIS — Z86010 Personal history of colonic polyps: Secondary | ICD-10-CM

## 2010-12-20 DIAGNOSIS — K648 Other hemorrhoids: Secondary | ICD-10-CM

## 2010-12-20 DIAGNOSIS — D369 Benign neoplasm, unspecified site: Secondary | ICD-10-CM

## 2010-12-20 DIAGNOSIS — Z1211 Encounter for screening for malignant neoplasm of colon: Secondary | ICD-10-CM

## 2010-12-20 MED ORDER — SODIUM CHLORIDE 0.9 % IV SOLN: 500.0000 mL | INTRAVENOUS | Status: DC

## 2010-12-20 NOTE — Patient Instructions (Signed)
SEE BLUE & GREEN DISCHARGE INSTRUCTION SHEETS GIVEN  INFORMATION ON HEMORRHOIDS & HIGH FIBER DIET GIVEN.

## 2010-12-21 ENCOUNTER — Telehealth: Payer: Self-pay

## 2010-12-21 NOTE — Telephone Encounter (Signed)
Follow up Call- Patient questions:  Do you have a fever, pain , or abdominal swelling? no Pain Score  0 *  Have you tolerated food without any problems? yes  Have you been able to return to your normal activities? yes  Do you have any questions about your discharge instructions: Diet   no Medications  no Follow up visit  no  Do you have questions or concerns about your Care? no  Actions: * If pain score is 4 or above: No action needed, pain <4. "I'm just fine", per pt.  MAW

## 2011-02-18 HISTORY — PX: CAROTID ENDARTERECTOMY: SUR193

## 2011-09-11 ENCOUNTER — Encounter: Payer: Self-pay | Admitting: Vascular Surgery

## 2011-09-12 ENCOUNTER — Other Ambulatory Visit (INDEPENDENT_AMBULATORY_CARE_PROVIDER_SITE_OTHER): Payer: Medicare Other | Admitting: *Deleted

## 2011-09-12 ENCOUNTER — Encounter: Payer: Self-pay | Admitting: Vascular Surgery

## 2011-09-12 ENCOUNTER — Ambulatory Visit (INDEPENDENT_AMBULATORY_CARE_PROVIDER_SITE_OTHER): Payer: Medicare Other | Admitting: Vascular Surgery

## 2011-09-12 VITALS — BP 113/50 | HR 54 | Resp 16 | Ht 68.0 in | Wt 200.0 lb

## 2011-09-12 DIAGNOSIS — Z48812 Encounter for surgical aftercare following surgery on the circulatory system: Secondary | ICD-10-CM

## 2011-09-12 DIAGNOSIS — I6529 Occlusion and stenosis of unspecified carotid artery: Secondary | ICD-10-CM

## 2011-09-12 NOTE — Progress Notes (Signed)
[facility]  Patient name: Troy [name] MRN: [id number] DOB: 09-19-1940 Sex: male  REASON FOR VISIT: follow up of carotid disease  HPI: Troy [name] is a 71 y.o. male who underwent a left carotid endarterectomy on 02/17/2010 for asymptomatic left carotid stenosis. He returns for a yearly follow up visit. Since I saw him last she's had no history of stroke, TIAs, expressive or receptive aphasia, or amaurosis fugax.  He has lost some weight intentionally and has been on a diet. He has a history of some mild renal insufficiency. There has been no other significant change in his medical history.  Past Medical History  Diagnosis Date  . Arthritis   . Cataract   . Hyperlipidemia   . Hypertension   . Kidney calculi   . Stroke     right eye vision poor    Family History  Problem Relation Age of Onset  . Colon polyps Maternal Uncle     SOCIAL HISTORY: History  Substance Use Topics  . Smoking status: Former Research scientist (life sciences)  . Smokeless tobacco: Never Used  . Alcohol Use: No    Allergies  Allergen Reactions  . Tetanus Toxoids Anaphylaxis  . Penicillins Other (See Comments)    Passed out  . Sulfa Antibiotics     Current Outpatient Prescriptions  Medication Sig Dispense Refill  . amLODipine (NORVASC) 10 MG tablet Take 1 tablet by mouth Daily.      Marland Kitchen aspirin 325 MG EC tablet Take 325 mg by mouth daily.        Marland Kitchen atorvastatin (LIPITOR) 40 MG tablet Take 1 tablet by mouth Daily.      Marland Kitchen CINNAMON PO Take 1 capsule by mouth daily.        . fenofibrate 160 MG tablet Take 1 tablet by mouth Daily.      . furosemide (LASIX) 40 MG tablet Take 1 tablet by mouth Twice daily.      Marland Kitchen labetalol (NORMODYNE) 100 MG tablet Take 200 mg by mouth Daily.      Marland Kitchen lisinopril (PRINIVIL,ZESTRIL) 40 MG tablet Take 1 tablet by mouth Daily.      . sertraline (ZOLOFT) 50 MG tablet Take 1 tablet by mouth Daily.      . peg 3350 powder (MOVIPREP) 100 G SOLR MOVI PREP take as directed  1  kit  0  . polyethylene glycol (MIRALAX / GLYCOLAX) packet Take 17 g by mouth daily.         Current Facility-Administered Medications  Medication Dose Route Frequency Provider Last Rate Last Dose  . 0.9 %  sodium chloride infusion  500 mL Intravenous Continuous Lafayette Dragon, MD        REVIEW OF SYSTEMS: Valu.Nieves ] denotes positive finding; [  ] denotes negative finding  CARDIOVASCULAR:  [ ]  chest pain   [ ]  chest pressure   [ ]  palpitations   [ ]  orthopnea   Valu.Nieves ] dyspnea on exertion   [ ]  claudication   [ ]  rest pain   [ ]  DVT   [ ]  phlebitis PULMONARY:   [ ]  productive cough   [ ]  asthma   [ ]  wheezing NEUROLOGIC:   [ ]  weakness  [ ]  paresthesias  [ ]  aphasia  [ ]  amaurosis  Valu.Nieves ] dizziness HEMATOLOGIC:   [ ]  bleeding problems   [ ]  clotting disorders MUSCULOSKELETAL:  [ ]  joint pain   [ ]  joint swelling Valu.Nieves ] leg swelling GASTROINTESTINAL: [ ]   blood in stool  [ ]   hematemesis GENITOURINARY:  [ ]   dysuria  [ ]   hematuria PSYCHIATRIC:  [ ]  history of major depression INTEGUMENTARY:  [ ]  rashes  [ ]  ulcers CONSTITUTIONAL:  [ ]  fever   [ ]  chills  PHYSICAL EXAM: Filed Vitals:   09/12/11 0954 09/12/11 0955  BP: 131/56 113/50  Pulse: 53 54  Resp: 16   Height: 5\' 8"  (1.727 m)   Weight: 200 lb (90.719 kg)   SpO2: 98% 98%   Body mass index is 30.41 kg/(m^2). GENERAL: The patient is a well-nourished male, in no acute distress. The vital signs are documented above. CARDIOVASCULAR: There is a regular rate and rhythm without significant murmur appreciated. I do not detect carotid bruits. PULMONARY: There is good air exchange bilaterally without wheezing or rales. ABDOMEN: Soft and non-tender with normal pitched bowel sounds.  MUSCULOSKELETAL: There are no major deformities or cyanosis. NEUROLOGIC: No focal weakness or paresthesias are detected. SKIN: There are no ulcers or rashes noted. PSYCHIATRIC: The patient has a normal affect.  DATA:  I have independently interpreted his carotid duplex  scan which shows no evidence of recurrent carotid stenosis on the left. He has a 40-59% right carotid stenosis. Both vertebral arteries are patent with antegrade flow.  MEDICAL ISSUES:  Occlusion and stenosis of carotid artery without mention of cerebral infarction This patient has a 40-59% right carotid stenosis. He understands we would not consider right carotid endarterectomy unless the stenosis progressed to greater than 80%. I have ordered a follow carotid duplex scan in 1 year and I'll see him back at that time. He knows to call sooner if he has problems. In the meantime he knows to continue taking his aspirin.    [facility] [place] Beeper: [phone]

## 2011-09-12 NOTE — Assessment & Plan Note (Signed)
This patient has a 40-59% right carotid stenosis. He understands we would not consider right carotid endarterectomy unless the stenosis progressed to greater than 80%. I have ordered a follow carotid duplex scan in 1 year and I'll see him back at that time. He knows to call sooner if he has problems. In the meantime he knows to continue taking his aspirin.

## 2011-09-13 NOTE — Procedures (Unsigned)
CAROTID DUPLEX EXAM  INDICATION:  Carotid artery disease  HISTORY: Diabetes:  no Cardiac:  Atrial fibrillation Hypertension:  yes Smoking:  Previous. Previous Surgery:  Left carotid endarterectomy with Dacron patch 02/17/2010. CV History:  Vertigo Amaurosis Fugax No, Paresthesias No, Hemiparesis No                                      RIGHT             LEFT Brachial systolic pressure:         142               144 Brachial Doppler waveforms:         triphasic         triphasic Vertebral direction of flow:        Antegrade         Antegrade DUPLEX VELOCITIES (cm/sec) CCA peak systolic                   126               0000000 ECA peak systolic                   129               0000000 ICA peak systolic                   115 (mid)         89 ICA end diastolic                   39                31 PLAQUE MORPHOLOGY:                  Calcific          None PLAQUE AMOUNT:                      Moderate PLAQUE LOCATION:                    ICA  IMPRESSION:  40% to 59% right ICA stenosis, lower end of range.  Patent left carotid endarterectomy site with no evidence for restenosis.  Vertebral artery flow antegrade bilaterally.  ___________________________________________ Judeth Cornfield. Scot Dock, M.D.  SS/MEDQ  D:  09/12/2011  T:  09/12/2011  Job:  [id number]

## 2011-10-04 ENCOUNTER — Other Ambulatory Visit (HOSPITAL_COMMUNITY): Payer: Self-pay | Admitting: *Deleted

## 2011-10-05 ENCOUNTER — Encounter (HOSPITAL_COMMUNITY)
Admission: RE | Admit: 2011-10-05 | Discharge: 2011-10-05 | Disposition: A | Discharge status: Home / Self Care | Payer: Medicare Other | Source: Ambulatory Visit | Attending: Nephrology | Admitting: Nephrology

## 2011-10-05 DIAGNOSIS — D638 Anemia in other chronic diseases classified elsewhere: Secondary | ICD-10-CM | POA: Insufficient documentation

## 2011-10-05 DIAGNOSIS — N184 Chronic kidney disease, stage 4 (severe): Secondary | ICD-10-CM | POA: Insufficient documentation

## 2011-10-05 LAB — POCT HEMOGLOBIN-HEMACUE: Hemoglobin: 8.8 g/dL — ABNORMAL LOW (ref 13.0–17.0)

## 2011-10-05 MED ORDER — SODIUM CHLORIDE 0.9 % IV SOLN
INTRAVENOUS | Status: DC
  Administered 2011-10-05: 10:00:00 via INTRAVENOUS

## 2011-10-05 MED ORDER — FERUMOXYTOL INJECTION 510 MG/17 ML
510.0000 mg | INTRAVENOUS | Status: DC
  Administered 2011-10-05: 510 mg via INTRAVENOUS
  Filled 2011-10-05: qty 17

## 2011-10-05 MED ORDER — EPOETIN ALFA 20000 UNIT/ML IJ SOLN
20000.0000 [IU] | INTRAMUSCULAR | Status: DC
  Administered 2011-10-05: 20000 [IU] via SUBCUTANEOUS

## 2011-10-05 MED ORDER — EPOETIN ALFA 20000 UNIT/ML IJ SOLN
INTRAMUSCULAR | Status: AC
  Administered 2011-10-05: 20000 [IU] via SUBCUTANEOUS
  Filled 2011-10-05: qty 1

## 2011-10-13 ENCOUNTER — Emergency Department (HOSPITAL_COMMUNITY): Payer: Medicare Other

## 2011-10-13 ENCOUNTER — Inpatient Hospital Stay (HOSPITAL_COMMUNITY)
Admission: EM | Admit: 2011-10-13 | Discharge: 2011-10-18 | DRG: 200 | Disposition: A | Discharge status: Home / Self Care | Payer: Medicare Other | Attending: Internal Medicine | Admitting: Internal Medicine

## 2011-10-13 ENCOUNTER — Encounter (HOSPITAL_COMMUNITY): Payer: Self-pay | Admitting: Emergency Medicine

## 2011-10-13 DIAGNOSIS — R748 Abnormal levels of other serum enzymes: Secondary | ICD-10-CM

## 2011-10-13 DIAGNOSIS — S40029A Contusion of unspecified upper arm, initial encounter: Secondary | ICD-10-CM | POA: Diagnosis present

## 2011-10-13 DIAGNOSIS — S2249XA Multiple fractures of ribs, unspecified side, initial encounter for closed fracture: Secondary | ICD-10-CM

## 2011-10-13 DIAGNOSIS — N189 Chronic kidney disease, unspecified: Secondary | ICD-10-CM

## 2011-10-13 DIAGNOSIS — N289 Disorder of kidney and ureter, unspecified: Secondary | ICD-10-CM

## 2011-10-13 DIAGNOSIS — Y998 Other external cause status: Secondary | ICD-10-CM

## 2011-10-13 DIAGNOSIS — J9383 Other pneumothorax: Principal | ICD-10-CM | POA: Diagnosis present

## 2011-10-13 DIAGNOSIS — I129 Hypertensive chronic kidney disease with stage 1 through stage 4 chronic kidney disease, or unspecified chronic kidney disease: Secondary | ICD-10-CM | POA: Diagnosis present

## 2011-10-13 DIAGNOSIS — I6529 Occlusion and stenosis of unspecified carotid artery: Secondary | ICD-10-CM

## 2011-10-13 DIAGNOSIS — Z7982 Long term (current) use of aspirin: Secondary | ICD-10-CM

## 2011-10-13 DIAGNOSIS — T07XXXA Unspecified multiple injuries, initial encounter: Secondary | ICD-10-CM

## 2011-10-13 DIAGNOSIS — N185 Chronic kidney disease, stage 5: Secondary | ICD-10-CM | POA: Diagnosis present

## 2011-10-13 DIAGNOSIS — D62 Acute posthemorrhagic anemia: Secondary | ICD-10-CM | POA: Diagnosis present

## 2011-10-13 DIAGNOSIS — S0003XA Contusion of scalp, initial encounter: Secondary | ICD-10-CM | POA: Diagnosis present

## 2011-10-13 DIAGNOSIS — D631 Anemia in chronic kidney disease: Secondary | ICD-10-CM | POA: Diagnosis present

## 2011-10-13 DIAGNOSIS — S42123A Displaced fracture of acromial process, unspecified shoulder, initial encounter for closed fracture: Secondary | ICD-10-CM | POA: Diagnosis present

## 2011-10-13 DIAGNOSIS — Z8673 Personal history of transient ischemic attack (TIA), and cerebral infarction without residual deficits: Secondary | ICD-10-CM

## 2011-10-13 DIAGNOSIS — E785 Hyperlipidemia, unspecified: Secondary | ICD-10-CM | POA: Diagnosis present

## 2011-10-13 DIAGNOSIS — S2239XA Fracture of one rib, unspecified side, initial encounter for closed fracture: Secondary | ICD-10-CM

## 2011-10-13 DIAGNOSIS — S1093XA Contusion of unspecified part of neck, initial encounter: Secondary | ICD-10-CM | POA: Diagnosis present

## 2011-10-13 DIAGNOSIS — I10 Essential (primary) hypertension: Secondary | ICD-10-CM | POA: Diagnosis present

## 2011-10-13 HISTORY — DX: Other motorcycle driver injured in collision with pedal cycle in nontraffic accident, initial encounter: V21.09XA

## 2011-10-13 LAB — LACTIC ACID, PLASMA: Lactic Acid, Venous: 0.9 mmol/L (ref 0.5–2.2)

## 2011-10-13 LAB — DIFFERENTIAL
Eosinophils Absolute: 0.1 10*3/uL (ref 0.0–0.7)
Eosinophils Relative: 2 % (ref 0–5)
Lymphocytes Relative: 16 % (ref 12–46)
Lymphs Abs: 1 10*3/uL (ref 0.7–4.0)
Monocytes Absolute: 0.3 10*3/uL (ref 0.1–1.0)

## 2011-10-13 LAB — TYPE AND SCREEN: ABO/RH(D): B POS

## 2011-10-13 LAB — COMPREHENSIVE METABOLIC PANEL
AST: 207 U/L — ABNORMAL HIGH (ref 0–37)
Albumin: 3.8 g/dL (ref 3.5–5.2)
Alkaline Phosphatase: 29 U/L — ABNORMAL LOW (ref 39–117)
Chloride: 99 mEq/L (ref 96–112)
Potassium: 4.5 mEq/L (ref 3.5–5.1)
Sodium: 138 mEq/L (ref 135–145)
Total Bilirubin: 0.4 mg/dL (ref 0.3–1.2)
Total Protein: 7.1 g/dL (ref 6.0–8.3)

## 2011-10-13 LAB — CBC
HCT: 28.7 % — ABNORMAL LOW (ref 39.0–52.0)
MCH: 31 pg (ref 26.0–34.0)
MCV: 94.7 fL (ref 78.0–100.0)
Platelets: 251 10*3/uL (ref 150–400)
RBC: 3.03 MIL/uL — ABNORMAL LOW (ref 4.22–5.81)
RDW: 15.5 % (ref 11.5–15.5)
WBC: 6 10*3/uL (ref 4.0–10.5)
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
## 2011-10-13 MED ORDER — MORPHINE SULFATE 4 MG/ML IJ SOLN
4.0000 mg | Freq: Once | INTRAMUSCULAR | Status: AC
  Administered 2011-10-13: 4 mg via INTRAVENOUS
  Filled 2011-10-13: qty 1

## 2011-10-13 MED ORDER — SODIUM CHLORIDE 0.9 % IV SOLN: INTRAVENOUS | Status: DC

## 2011-10-13 MED ORDER — ONDANSETRON HCL 4 MG/2ML IJ SOLN
4.0000 mg | Freq: Once | INTRAMUSCULAR | Status: AC
  Administered 2011-10-13: 4 mg via INTRAVENOUS
  Filled 2011-10-13: qty 2

## 2011-10-13 MED ORDER — FENTANYL CITRATE 0.05 MG/ML IJ SOLN
50.0000 ug | Freq: Once | INTRAMUSCULAR | Status: AC
  Administered 2011-10-13: 50 ug via INTRAVENOUS
  Filled 2011-10-13: qty 2

## 2011-10-13 MED ORDER — FERUMOXYTOL INJECTION 510 MG/17 ML: 510.0000 mg | INTRAVENOUS | Status: DC

## 2011-10-13 MED ORDER — BACITRACIN ZINC 500 UNIT/GM EX OINT
TOPICAL_OINTMENT | Freq: Every day | CUTANEOUS | Status: DC
  Administered 2011-10-13 – 2011-10-16 (×4): via TOPICAL
  Administered 2011-10-17 – 2011-10-18 (×2): 1 via TOPICAL
  Filled 2011-10-13: qty 15
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
## 2011-10-13 NOTE — ED Notes (Signed)
Patient transported to CT 
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
## 2011-10-13 NOTE — ED Notes (Signed)
Introduced myself to family and pt. Pt assisted to use urinal. Pt remains alert and oriented. Family updated with pt status.
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
## 2011-10-13 NOTE — ED Notes (Signed)
Pt returns from radiology. Pt place on monitor, pulse ox, bp and 2lt 02 [place].

## 2011-10-14 ENCOUNTER — Inpatient Hospital Stay (HOSPITAL_COMMUNITY): Payer: Medicare Other

## 2011-10-14 ENCOUNTER — Encounter (HOSPITAL_COMMUNITY): Payer: Self-pay | Admitting: *Deleted

## 2011-10-14 LAB — CBC
Platelets: 209 10*3/uL (ref 150–400)
RBC: 2.54 MIL/uL — ABNORMAL LOW (ref 4.22–5.81)
WBC: 8.8 10*3/uL (ref 4.0–10.5)

## 2011-10-14 LAB — COMPREHENSIVE METABOLIC PANEL
BUN: 67 mg/dL — ABNORMAL HIGH (ref 6–23)
CO2: 24 mEq/L (ref 19–32)
Chloride: 100 mEq/L (ref 96–112)
Creatinine, Ser: 3.67 mg/dL — ABNORMAL HIGH (ref 0.50–1.35)
GFR calc Af Amer: 18 mL/min — ABNORMAL LOW (ref >90)
GFR calc non Af Amer: 15 mL/min — ABNORMAL LOW (ref >90)
Glucose, Bld: 101 mg/dL — ABNORMAL HIGH (ref 70–99)
Total Bilirubin: 0.5 mg/dL (ref 0.3–1.2)

## 2011-10-14 LAB — MRSA PCR SCREENING: MRSA by PCR: NEGATIVE

## 2011-10-14 LAB — PHOSPHORUS: Phosphorus: 5.6 mg/dL — ABNORMAL HIGH (ref 2.3–4.6)

## 2011-10-14 IMAGING — CR DG CHEST 2V
2 series · 2 of 2 positions shown · non-contrast
Comparison: [DATE]

CLINICAL DATA: Rule out pneumothorax

CHEST - 2 VIEW

[w chest lat]
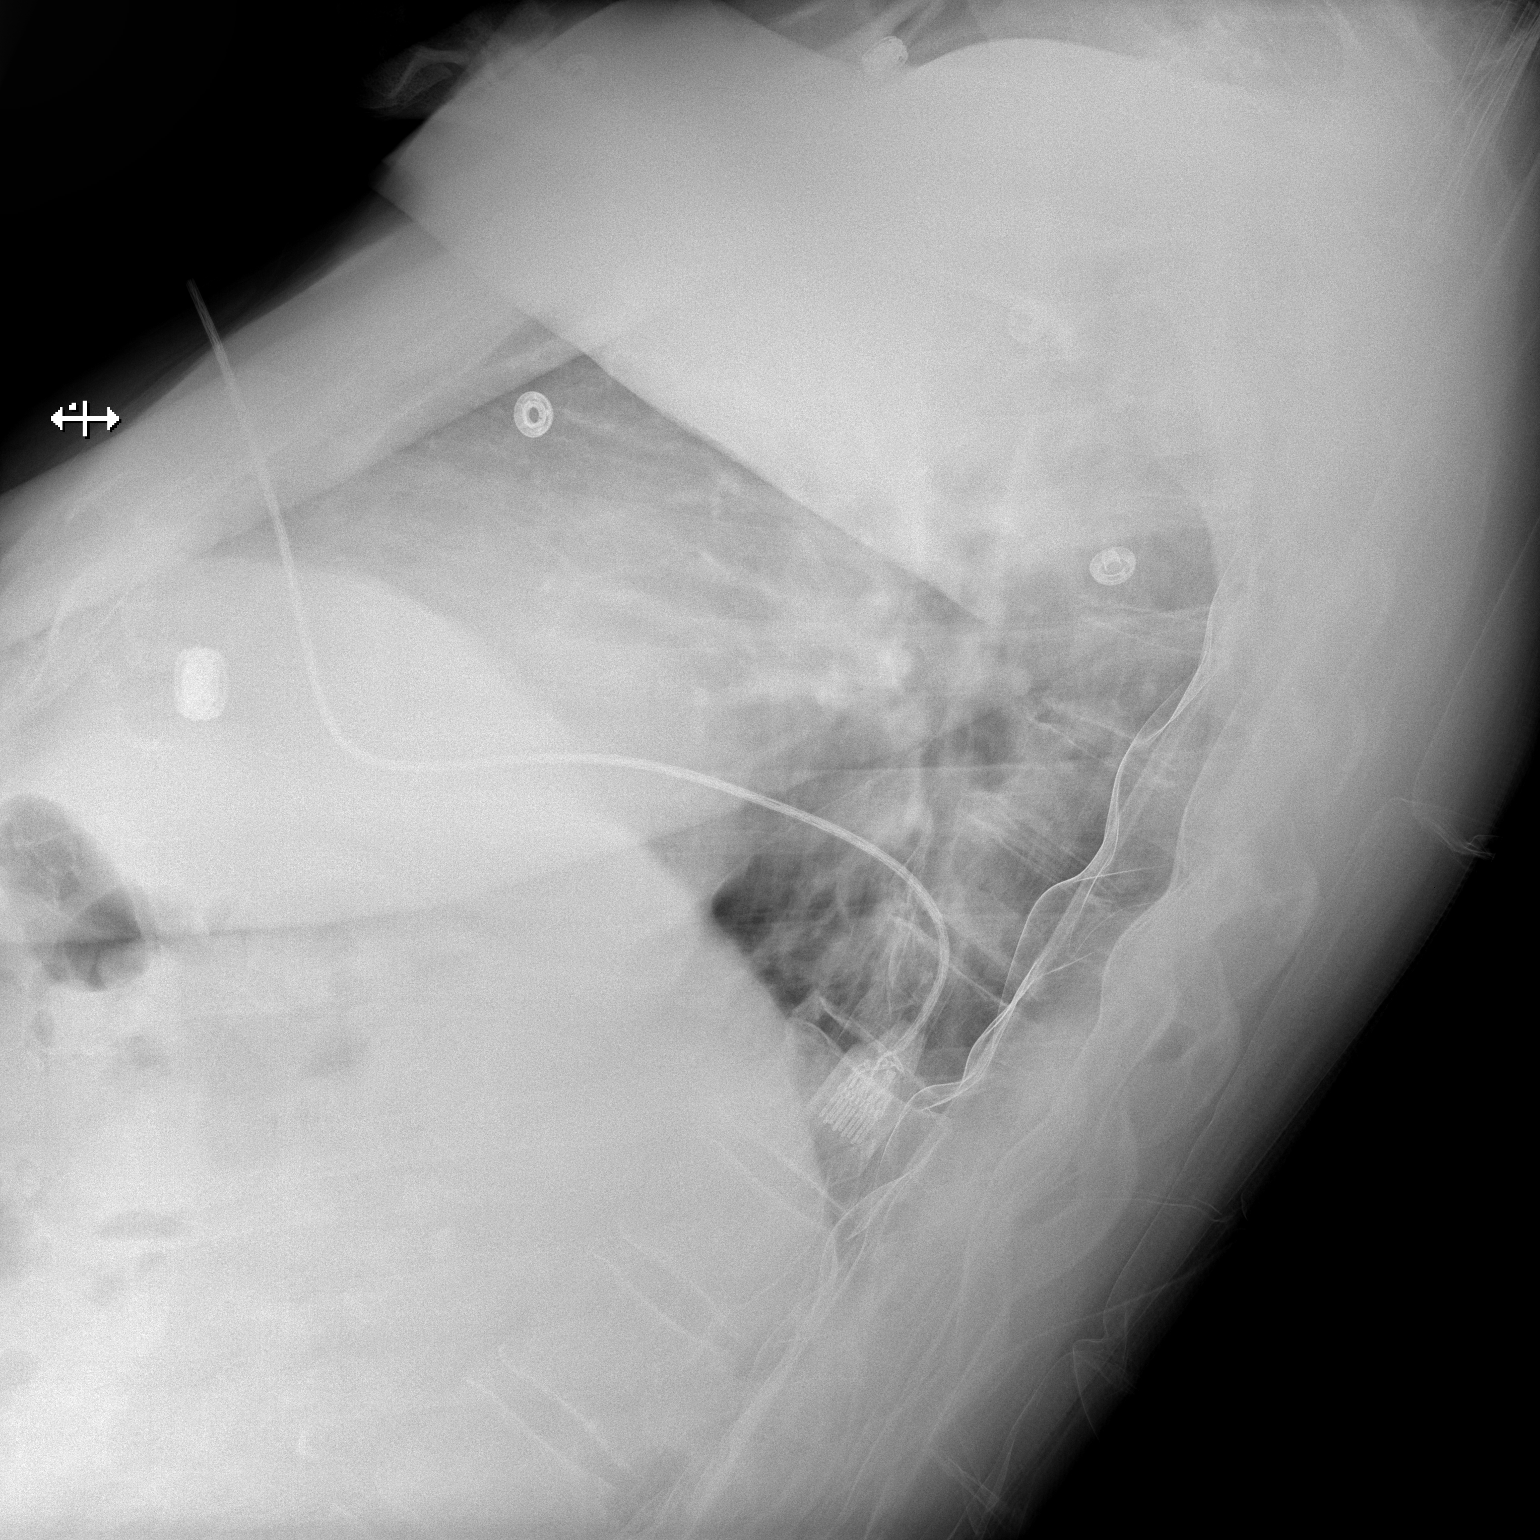

[x chest ap]
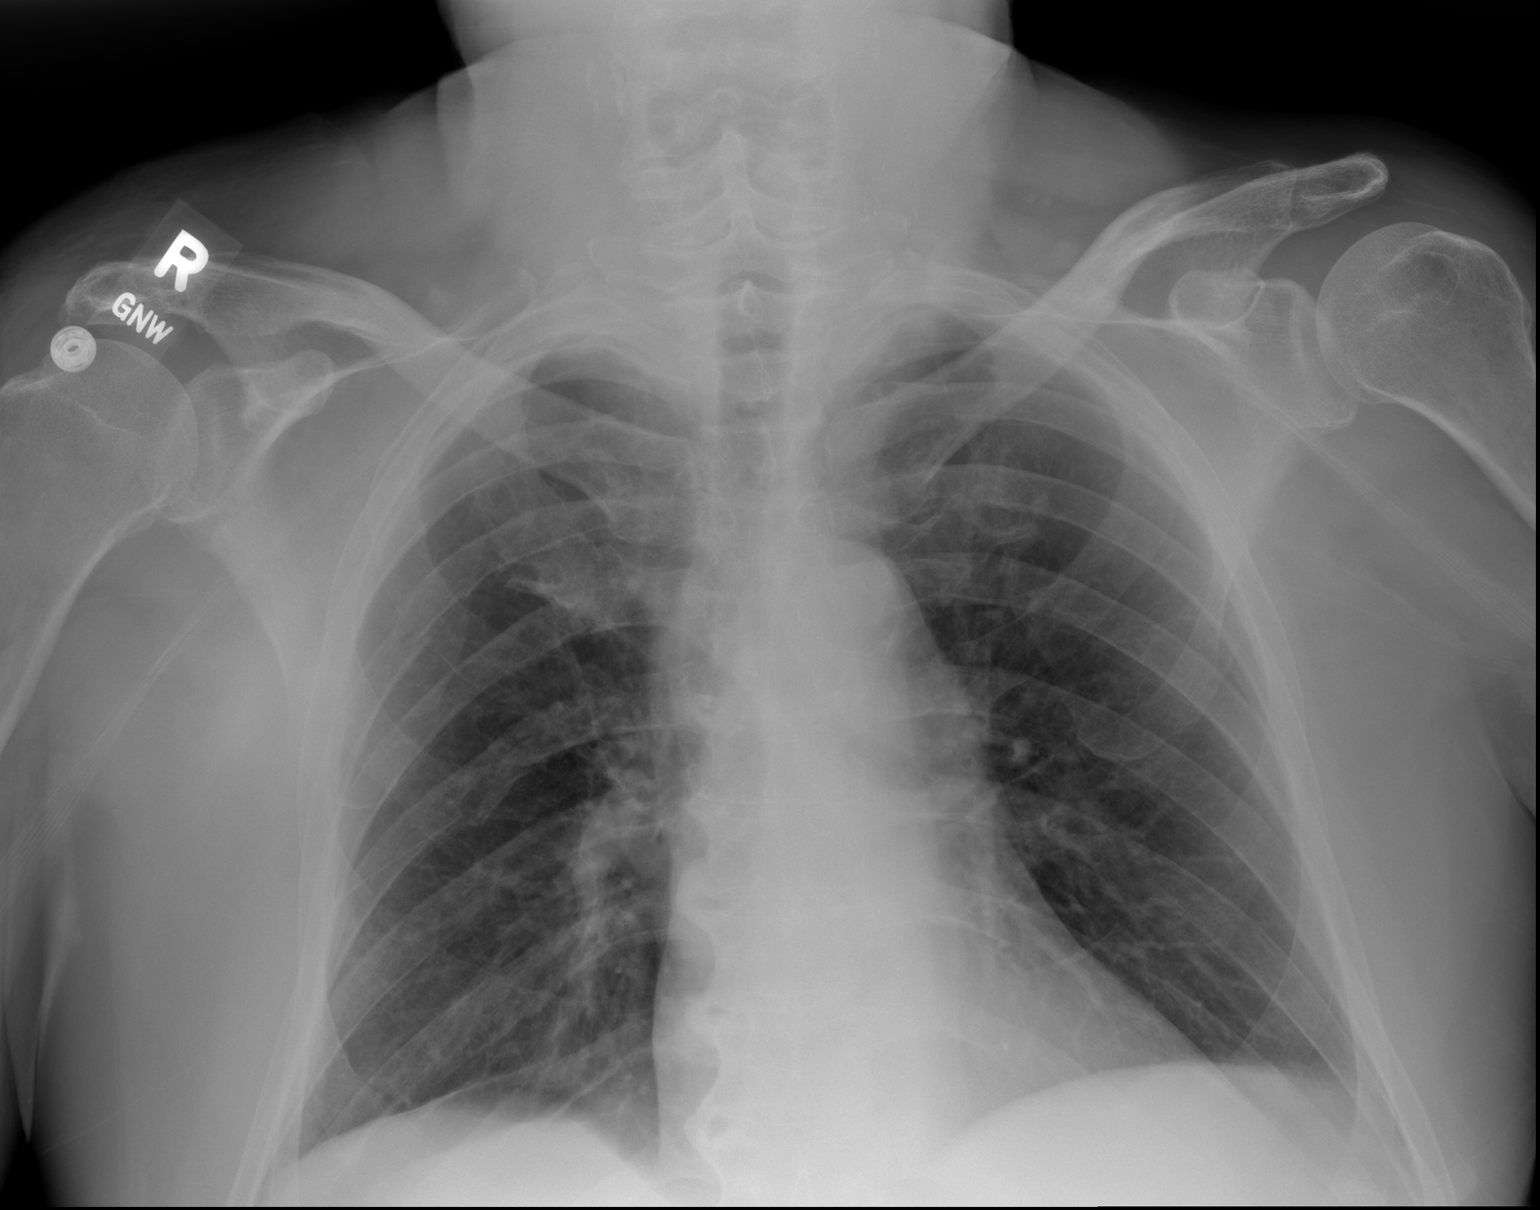

[2 of 2 positions shown; findings below may reference images not displayed]

FINDINGS: Heart size appears normal.

There is no pleural effusion or pulmonary edema.

No airspace consolidation identified.
IMPRESSION: 1.  No active cardiopulmonary abnormalities.

## 2011-10-14 MED ORDER — MORPHINE SULFATE 4 MG/ML IJ SOLN
4.0000 mg | INTRAMUSCULAR | Status: DC | PRN
  Administered 2011-10-14 – 2011-10-15 (×3): 4 mg via INTRAVENOUS
  Filled 2011-10-14 (×3): qty 1

## 2011-10-14 MED ORDER — SODIUM CHLORIDE 0.9 % IV SOLN: 250.0000 mL | INTRAVENOUS | Status: DC | PRN

## 2011-10-14 MED ORDER — ONDANSETRON HCL 4 MG PO TABS: 4.0000 mg | ORAL_TABLET | Freq: Four times a day (QID) | ORAL | Status: DC | PRN

## 2011-10-14 MED ORDER — GUAIFENESIN-DM 100-10 MG/5ML PO SYRP: 5.0000 mL | ORAL_SOLUTION | ORAL | Status: DC | PRN

## 2011-10-14 MED ORDER — AMLODIPINE BESYLATE 10 MG PO TABS
10.0000 mg | ORAL_TABLET | Freq: Every day | ORAL | Status: DC
  Administered 2011-10-14 – 2011-10-18 (×5): 10 mg via ORAL
  Filled 2011-10-14 (×5): qty 1

## 2011-10-14 MED ORDER — MORPHINE SULFATE 4 MG/ML IJ SOLN
4.0000 mg | INTRAMUSCULAR | Status: DC | PRN
  Administered 2011-10-14 (×2): 4 mg via INTRAVENOUS
  Filled 2011-10-14 (×2): qty 1

## 2011-10-14 MED ORDER — SODIUM CHLORIDE 0.9 % IJ SOLN: 3.0000 mL | INTRAMUSCULAR | Status: DC | PRN

## 2011-10-14 MED ORDER — ACETAMINOPHEN 650 MG RE SUPP: 650.0000 mg | Freq: Four times a day (QID) | RECTAL | Status: DC | PRN

## 2011-10-14 MED ORDER — FENOFIBRATE 160 MG PO TABS
160.0000 mg | ORAL_TABLET | Freq: Every day | ORAL | Status: DC
  Administered 2011-10-14 – 2011-10-18 (×5): 160 mg via ORAL
  Filled 2011-10-14 (×5): qty 1

## 2011-10-14 MED ORDER — ACETAMINOPHEN 325 MG PO TABS
650.0000 mg | ORAL_TABLET | Freq: Four times a day (QID) | ORAL | Status: DC | PRN
  Administered 2011-10-14: 650 mg via ORAL
  Filled 2011-10-14: qty 2

## 2011-10-14 MED ORDER — LABETALOL HCL 100 MG PO TABS
100.0000 mg | ORAL_TABLET | Freq: Two times a day (BID) | ORAL | Status: DC
  Administered 2011-10-14 – 2011-10-16 (×5): 100 mg via ORAL
  Filled 2011-10-14 (×7): qty 1

## 2011-10-14 MED ORDER — SODIUM CHLORIDE 0.9 % IJ SOLN
3.0000 mL | Freq: Two times a day (BID) | INTRAMUSCULAR | Status: DC
  Administered 2011-10-14 – 2011-10-18 (×9): 3 mL via INTRAVENOUS

## 2011-10-14 MED ORDER — SERTRALINE HCL 50 MG PO TABS
50.0000 mg | ORAL_TABLET | Freq: Every day | ORAL | Status: DC
  Administered 2011-10-14 – 2011-10-18 (×5): 50 mg via ORAL
  Filled 2011-10-14 (×5): qty 1

## 2011-10-14 MED ORDER — ONDANSETRON HCL 4 MG/2ML IJ SOLN: 4.0000 mg | Freq: Four times a day (QID) | INTRAMUSCULAR | Status: DC | PRN

## 2011-10-14 MED ORDER — ATORVASTATIN CALCIUM 40 MG PO TABS
40.0000 mg | ORAL_TABLET | Freq: Every day | ORAL | Status: DC
  Filled 2011-10-14: qty 1

## 2011-10-14 MED ORDER — HYDROCODONE-ACETAMINOPHEN 5-325 MG PO TABS
1.0000 | ORAL_TABLET | ORAL | Status: DC | PRN
  Administered 2011-10-14: 2 via ORAL
  Filled 2011-10-14: qty 2

## 2011-10-14 MED ORDER — OXYCODONE HCL 5 MG PO TABS
5.0000 mg | ORAL_TABLET | ORAL | Status: DC | PRN
  Administered 2011-10-16: 5 mg via ORAL
  Filled 2011-10-14: qty 1

## 2011-10-14 MED ORDER — FUROSEMIDE 40 MG PO TABS
40.0000 mg | ORAL_TABLET | Freq: Every day | ORAL | Status: DC
  Administered 2011-10-14: 40 mg via ORAL
  Filled 2011-10-14: qty 1

## 2011-10-14 MED ORDER — LABETALOL HCL 200 MG PO TABS
200.0000 mg | ORAL_TABLET | Freq: Once | ORAL | Status: DC
  Filled 2011-10-14: qty 1

## 2011-10-14 MED ORDER — ALUM & MAG HYDROXIDE-SIMETH 200-200-20 MG/5ML PO SUSP: 30.0000 mL | Freq: Four times a day (QID) | ORAL | Status: DC | PRN

## 2011-10-14 MED ORDER — DOCUSATE SODIUM 100 MG PO CAPS
100.0000 mg | ORAL_CAPSULE | Freq: Two times a day (BID) | ORAL | Status: DC
  Administered 2011-10-14 – 2011-10-18 (×9): 100 mg via ORAL
  Filled 2011-10-14 (×9): qty 1

## 2011-10-14 MED ORDER — SODIUM CHLORIDE 0.9 % IJ SOLN
3.0000 mL | Freq: Two times a day (BID) | INTRAMUSCULAR | Status: DC
  Administered 2011-10-14 – 2011-10-15 (×3): 3 mL via INTRAVENOUS

## 2011-10-14 MED ORDER — ALBUTEROL SULFATE (5 MG/ML) 0.5% IN NEBU: 2.5000 mg | INHALATION_SOLUTION | RESPIRATORY_TRACT | Status: DC | PRN
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
## 2011-10-15 ENCOUNTER — Inpatient Hospital Stay (HOSPITAL_COMMUNITY): Payer: Medicare Other

## 2011-10-15 DIAGNOSIS — D62 Acute posthemorrhagic anemia: Secondary | ICD-10-CM

## 2011-10-15 LAB — CBC
Platelets: 190 10*3/uL (ref 150–400)
RBC: 2.38 MIL/uL — ABNORMAL LOW (ref 4.22–5.81)
RDW: 15.5 % (ref 11.5–15.5)
WBC: 9.9 10*3/uL (ref 4.0–10.5)

## 2011-10-15 LAB — BASIC METABOLIC PANEL
CO2: 26 mEq/L (ref 19–32)
Calcium: 8.6 mg/dL (ref 8.4–10.5)
Chloride: 100 mEq/L (ref 96–112)
GFR calc Af Amer: 18 mL/min — ABNORMAL LOW (ref >90)
Sodium: 137 mEq/L (ref 135–145)
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
## 2011-10-15 MED ORDER — IOHEXOL 300 MG/ML  SOLN
20.0000 mL | INTRAMUSCULAR | Status: AC
  Administered 2011-10-15 (×2): 20 mL via ORAL

## 2011-10-15 MED ORDER — FUROSEMIDE 20 MG PO TABS
20.0000 mg | ORAL_TABLET | Freq: Two times a day (BID) | ORAL | Status: DC
  Administered 2011-10-15 – 2011-10-17 (×4): 20 mg via ORAL
  Filled 2011-10-15 (×6): qty 1
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
## 2011-10-15 NOTE — Consult Note (Signed)
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  physician, rehab nurse, PT and OT to address physical and functional deficits in the context of the above medical diagnosis(es)? No Addressing deficits in the following areas: balance, endurance, transferring, bowel/bladder control, bathing, dressing, grooming and toileting 5. Can the patient actively participate in an intensive therapy program of at least 3 hrs of therapy per day at least 5 days per week? Potentially 6. The potential for patient to make measurable gains while on inpatient rehab is fair 7. Anticipated functional outcomes upon discharge from inpatient rehab are not applicable 8. Estimated rehab length of stay to reach the above functional goals is: Not app 9. Does the patient have adequate social supports to accommodate these discharge functional goals? Potentially 10. Anticipated D/C setting: Home 11. Anticipated post D/C treatments: [place] therapy 12. Overall Rehab/Functional Prognosis: excellent  RECOMMENDATIONS: This patient's condition is appropriate for continued  rehabilitative care in the following setting: [facility] Patient has agreed to participate in recommended program. Yes Note that insurance prior authorization may be required for reimbursement for recommended care.  Comment: I spoke with the patient and his daughter at length. He is moving his legs quite well and his mobility shouldn't be a major issue. Her home is equipped. His daughter can assist at home. They both prefer to do  rehabilitation at the house. I think that is reasonable given this is only hospital day #4.   Meredith Staggers M.D.  10/16/2011

## 2011-10-15 NOTE — Progress Notes (Signed)
Utilization Review Completed.Donne Anon T4/02/2012
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
## 2011-10-15 NOTE — Consult Note (Signed)
Pt was out of the room for a tests. I will see first thing tomorrow morning.  Oval Linsey, MD
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
## 2011-10-15 NOTE — Progress Notes (Signed)
Orthopedic Tech Progress Note Patient Details:  Troy [name] 08-29-1940 [id number]  Other Ortho Devices Ortho Device Location: sling and swathe Ortho Device Interventions: Application   Cammer, Theodoro Parma 10/15/2011, 10:18 AM
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
## 2011-10-16 DIAGNOSIS — S42109A Fracture of unspecified part of scapula, unspecified shoulder, initial encounter for closed fracture: Secondary | ICD-10-CM

## 2011-10-16 LAB — CBC
HCT: 21.3 % — ABNORMAL LOW (ref 39.0–52.0)
Hemoglobin: 7.1 g/dL — ABNORMAL LOW (ref 13.0–17.0)
MCH: 31.4 pg (ref 26.0–34.0)
MCHC: 33.3 g/dL (ref 30.0–36.0)
MCV: 94.2 fL (ref 78.0–100.0)

## 2011-10-16 LAB — HEMOGLOBIN A1C
Hgb A1c MFr Bld: 5.8 % — ABNORMAL HIGH (ref <5.7)
Mean Plasma Glucose: 120 mg/dL — ABNORMAL HIGH (ref <117)

## 2011-10-16 LAB — COMPREHENSIVE METABOLIC PANEL
Alkaline Phosphatase: 27 U/L — ABNORMAL LOW (ref 39–117)
BUN: 73 mg/dL — ABNORMAL HIGH (ref 6–23)
Calcium: 8.9 mg/dL (ref 8.4–10.5)
Creatinine, Ser: 3.24 mg/dL — ABNORMAL HIGH (ref 0.50–1.35)
GFR calc Af Amer: 21 mL/min — ABNORMAL LOW (ref >90)
Glucose, Bld: 114 mg/dL — ABNORMAL HIGH (ref 70–99)
Total Protein: 6.2 g/dL (ref 6.0–8.3)

## 2011-10-16 LAB — IRON AND TIBC

## 2011-10-16 MED ORDER — ATORVASTATIN CALCIUM 40 MG PO TABS
40.0000 mg | ORAL_TABLET | Freq: Every day | ORAL | Status: DC
  Administered 2011-10-17: 40 mg via ORAL
  Filled 2011-10-16 (×2): qty 1

## 2011-10-16 MED ORDER — MORPHINE SULFATE 2 MG/ML IJ SOLN
2.0000 mg | INTRAMUSCULAR | Status: DC | PRN
  Administered 2011-10-16: 2 mg via INTRAVENOUS
  Filled 2011-10-16: qty 1

## 2011-10-16 MED ORDER — OXYCODONE HCL 5 MG PO TABS
5.0000 mg | ORAL_TABLET | ORAL | Status: DC | PRN
  Administered 2011-10-16 – 2011-10-17 (×3): 10 mg via ORAL
  Administered 2011-10-18: 15 mg via ORAL
  Filled 2011-10-16: qty 3
  Filled 2011-10-16 (×3): qty 2
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
## 2011-10-16 NOTE — Progress Notes (Signed)
Rehab admissions - Evaluated for possible admission.  Please see rehab MD consult recommending [place] follow-up.  Patient and daughter prefer to go home and have therapies in the home setting.  Call me for in questions.  Pager [phone]
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
## 2011-10-16 NOTE — Progress Notes (Signed)
Agree with MJ's note.

## 2011-10-17 LAB — TYPE AND SCREEN: ABO/RH(D): B POS

## 2011-10-17 LAB — CBC
HCT: 23.6 % — ABNORMAL LOW (ref 39.0–52.0)
MCV: 92.9 fL (ref 78.0–100.0)
RBC: 2.54 MIL/uL — ABNORMAL LOW (ref 4.22–5.81)
RDW: 15.8 % — ABNORMAL HIGH (ref 11.5–15.5)
WBC: 11.5 10*3/uL — ABNORMAL HIGH (ref 4.0–10.5)

## 2011-10-17 LAB — RENAL FUNCTION PANEL
BUN: 80 mg/dL — ABNORMAL HIGH (ref 6–23)
CO2: 26 mEq/L (ref 19–32)
Chloride: 98 mEq/L (ref 96–112)
Creatinine, Ser: 3.1 mg/dL — ABNORMAL HIGH (ref 0.50–1.35)

## 2011-10-17 MED ORDER — FUROSEMIDE 40 MG PO TABS
40.0000 mg | ORAL_TABLET | Freq: Every day | ORAL | Status: DC
  Administered 2011-10-18: 40 mg via ORAL
  Filled 2011-10-17: qty 1

## 2011-10-17 MED ORDER — DARBEPOETIN ALFA-POLYSORBATE 150 MCG/0.3ML IJ SOLN
150.0000 ug | INTRAMUSCULAR | Status: DC
  Administered 2011-10-17: 150 ug via SUBCUTANEOUS
  Filled 2011-10-17: qty 0.3

## 2011-10-17 MED ORDER — LABETALOL HCL 100 MG PO TABS
100.0000 mg | ORAL_TABLET | Freq: Two times a day (BID) | ORAL | Status: DC
  Administered 2011-10-17 – 2011-10-18 (×2): 100 mg via ORAL
  Filled 2011-10-17 (×3): qty 1
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
## 2011-10-18 LAB — CBC
HCT: 24.2 % — ABNORMAL LOW (ref 39.0–52.0)
Hemoglobin: 8 g/dL — ABNORMAL LOW (ref 13.0–17.0)
MCH: 30.4 pg (ref 26.0–34.0)
MCV: 92 fL (ref 78.0–100.0)
RBC: 2.63 MIL/uL — ABNORMAL LOW (ref 4.22–5.81)

## 2011-10-18 MED ORDER — ACETAMINOPHEN 325 MG PO TABS: 650.0000 mg | ORAL_TABLET | Freq: Four times a day (QID) | ORAL | Status: DC | PRN

## 2011-10-18 NOTE — Progress Notes (Signed)
Utilization Review Completed.Donne Anon T4/05/2012

## 2011-10-18 NOTE — Progress Notes (Signed)
Physical Therapy Treatment Patient Details Name: Troy [name] MRN: [id number] DOB: 20-Sep-1940 Today's Date: 10/18/2011  PT Assessment/Plan  PT - Assessment/Plan Comments on Treatment Session: Ambulating well. Daughter present throughout session and very interested in keeping her father safe. I answered all questions that were within my scope, they appear still concerned about his RUE and when he will be able to use it. Again, pt would benefit more from CIR but family aware that he needs a solid minA during all ambulation and OOB tasks. Likely d/c today.  PT Plan: Discharge plan remains appropriate;Frequency remains appropriate Follow Up Recommendations: Inpatient Rehab;Supervision/Assistance - 24 hour;Home health PT (if pt refuses CIR he will need HHPT with 24 hour assist) Equipment Recommended: Cane PT Goals  Acute Rehab PT Goals PT Goal: Sit to Stand - Progress: Progressing toward goal PT Goal: Stand to Sit - Progress: Progressing toward goal PT Transfer Goal: Bed to Chair/Chair to Bed - Progress: Progressing toward goal PT Goal: Stand - Progress: Progressing toward goal PT Goal: Ambulate - Progress: Progressing toward goal  PT Treatment Precautions/Restrictions  Precautions Precautions: Fall Required Braces or Orthoses DO NOT USE: No Restrictions Weight Bearing Restrictions: Yes RUE Weight Bearing: Non weight bearing Other Position/Activity Restrictions: sling Mobility (including Balance) Bed Mobility Bed Mobility: No Transfers Sit to Stand: 4: Min assist;From chair/3-in-1;With upper extremity assist;With armrests (with LUE to assist) Sit to Stand Details (indicate cue type and reason): minA for technique and stability especially initially upon standing Stand to Sit: 5: Supervision;To chair/3-in-1;With upper extremity assist;With armrests Stand to Sit Details: cues for safe technique Ambulation/Gait Ambulation/Gait Assistance: 4: Min assist Ambulation/Gait Assistance Details  (indicate cue type and reason): amb approx 200 ft with LUE HHA (SPC not available); cued pt to push through LUE to steady self as he would with cane; still swaying laterally during gait needing minA around trunk for stability (daughter present throughout ambulation to observe safe gaurding technique, daughter aware of need for assist everytime patient is up)  Ambulation Distance (Feet): 200 Feet  Static Standing Balance Static Standing - Balance Support: No upper extremity supported Static Standing - Level of Assistance: 5: Stand by assistance Exercise    End of Session PT - End of Session Equipment Utilized During Treatment: Gait belt Activity Tolerance: Patient tolerated treatment well (some sob with gait, cueing for slow controlled breathing) Patient left: in chair;with call bell in reach;with family/visitor present Nurse Communication: Mobility status for transfers;Mobility status for ambulation General Behavior During Session: [facility] for tasks performed Cognition: [facility] for tasks performed  [place] 10/18/2011, 4:34 PM

## 2011-10-18 NOTE — Progress Notes (Signed)
Discharge instructions give to pt and daughter.  Both verbalized understanding will all questions answered.  Home Health OT/PT have been set up.  Pt discharged home with daughter.    Vista Lawman, RN
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
## 2011-10-18 NOTE — Progress Notes (Signed)
Clinical social worker discussed patient disposition with medical team. Pt plans to dc home with home health as patient is refusing CIR. CSW informed Rn Case manager to follow up with pt regarding home health services. Pt plans to dc home later today as per discussion with pt MD.   .No further Clinical Social Work needs, signing off.   Dorathy Kinsman, [place] .10/18/2011 1130am

## 2011-10-19 ENCOUNTER — Encounter (HOSPITAL_COMMUNITY): Payer: Medicare Other

## 2011-10-24 NOTE — ED Provider Notes (Signed)
I saw and evaluated the patient, reviewed the resident's note and I agree with the findings and plan.   Delora Fuel, MD [id number]

## 2011-12-11 ENCOUNTER — Encounter (HOSPITAL_COMMUNITY)
Admission: RE | Admit: 2011-12-11 | Discharge: 2011-12-11 | Disposition: A | Discharge status: Home / Self Care | Payer: Medicare Other | Source: Ambulatory Visit | Attending: Nephrology | Admitting: Nephrology

## 2011-12-11 DIAGNOSIS — D638 Anemia in other chronic diseases classified elsewhere: Secondary | ICD-10-CM | POA: Insufficient documentation

## 2011-12-11 DIAGNOSIS — N184 Chronic kidney disease, stage 4 (severe): Secondary | ICD-10-CM | POA: Insufficient documentation

## 2011-12-11 MED ORDER — EPOETIN ALFA 20000 UNIT/ML IJ SOLN
20000.0000 [IU] | INTRAMUSCULAR | Status: DC
  Administered 2011-12-11: 20000 [IU] via SUBCUTANEOUS

## 2011-12-11 MED ORDER — EPOETIN ALFA 20000 UNIT/ML IJ SOLN
INTRAMUSCULAR | Status: AC
  Filled 2011-12-11: qty 1

## 2011-12-25 ENCOUNTER — Encounter (HOSPITAL_COMMUNITY)
Admission: RE | Admit: 2011-12-25 | Discharge: 2011-12-25 | Disposition: A | Discharge status: Home / Self Care | Payer: Medicare Other | Source: Ambulatory Visit | Attending: Nephrology | Admitting: Nephrology

## 2011-12-25 MED ORDER — EPOETIN ALFA 20000 UNIT/ML IJ SOLN
20000.0000 [IU] | INTRAMUSCULAR | Status: DC
  Administered 2011-12-25: 20000 [IU] via SUBCUTANEOUS
  Filled 2011-12-25: qty 1

## 2012-01-08 ENCOUNTER — Encounter (HOSPITAL_COMMUNITY)
Admission: RE | Admit: 2012-01-08 | Discharge: 2012-01-08 | Disposition: A | Discharge status: Home / Self Care | Payer: Medicare Other | Source: Ambulatory Visit | Attending: Nephrology | Admitting: Nephrology

## 2012-01-08 DIAGNOSIS — D638 Anemia in other chronic diseases classified elsewhere: Secondary | ICD-10-CM | POA: Insufficient documentation

## 2012-01-08 DIAGNOSIS — N184 Chronic kidney disease, stage 4 (severe): Secondary | ICD-10-CM | POA: Insufficient documentation

## 2012-01-08 LAB — POCT HEMOGLOBIN-HEMACUE: Hemoglobin: 10.6 g/dL — ABNORMAL LOW (ref 13.0–17.0)

## 2012-01-08 MED ORDER — EPOETIN ALFA 20000 UNIT/ML IJ SOLN
20000.0000 [IU] | INTRAMUSCULAR | Status: DC
Start: 2012-01-08 — End: 2012-01-09
  Administered 2012-01-08: 20000 [IU] via SUBCUTANEOUS
  Filled 2012-01-08 (×2): qty 1

## 2012-01-09 LAB — IRON AND TIBC
Saturation Ratios: 18 % — ABNORMAL LOW (ref 20–55)
TIBC: 391 ug/dL (ref 215–435)
UIBC: 320 ug/dL (ref 125–400)

## 2012-01-09 LAB — FERRITIN: Ferritin: 242 ng/mL (ref 22–322)

## 2012-01-21 ENCOUNTER — Other Ambulatory Visit (HOSPITAL_COMMUNITY): Payer: Self-pay | Admitting: *Deleted

## 2012-01-22 ENCOUNTER — Encounter (HOSPITAL_COMMUNITY)
Admission: RE | Admit: 2012-01-22 | Discharge: 2012-01-22 | Disposition: A | Discharge status: Home / Self Care | Payer: Medicare Other | Source: Ambulatory Visit | Attending: Nephrology | Admitting: Nephrology

## 2012-01-22 MED ORDER — SODIUM CHLORIDE 0.9 % IV SOLN
INTRAVENOUS | Status: DC
  Administered 2012-01-22: 11:00:00 via INTRAVENOUS

## 2012-01-22 MED ORDER — EPOETIN ALFA 20000 UNIT/ML IJ SOLN
20000.0000 [IU] | INTRAMUSCULAR | Status: DC
  Administered 2012-01-22: 20000 [IU] via SUBCUTANEOUS
  Filled 2012-01-22: qty 1

## 2012-01-22 MED ORDER — FERUMOXYTOL INJECTION 510 MG/17 ML
510.0000 mg | INTRAVENOUS | Status: DC
  Administered 2012-01-22: 510 mg via INTRAVENOUS
  Filled 2012-01-22: qty 17

## 2012-01-25 ENCOUNTER — Other Ambulatory Visit: Payer: Self-pay

## 2012-01-25 DIAGNOSIS — Z01810 Encounter for preprocedural cardiovascular examination: Secondary | ICD-10-CM

## 2012-01-25 DIAGNOSIS — N184 Chronic kidney disease, stage 4 (severe): Secondary | ICD-10-CM

## 2012-02-05 ENCOUNTER — Encounter (HOSPITAL_COMMUNITY)
Admission: RE | Admit: 2012-02-05 | Discharge: 2012-02-05 | Disposition: A | Discharge status: Home / Self Care | Payer: Medicare Other | Source: Ambulatory Visit | Attending: Nephrology | Admitting: Nephrology

## 2012-02-05 MED ORDER — SODIUM CHLORIDE 0.9 % IV SOLN
INTRAVENOUS | Status: AC
  Administered 2012-02-05: 09:00:00 via INTRAVENOUS

## 2012-02-05 MED ORDER — EPOETIN ALFA 20000 UNIT/ML IJ SOLN: 20000.0000 [IU] | INTRAMUSCULAR | Status: DC

## 2012-02-05 MED ORDER — FERUMOXYTOL INJECTION 510 MG/17 ML
510.0000 mg | INTRAVENOUS | Status: AC
  Administered 2012-02-05: 510 mg via INTRAVENOUS
  Filled 2012-02-05: qty 17

## 2012-02-12 ENCOUNTER — Encounter: Payer: Self-pay | Admitting: Vascular Surgery

## 2012-02-13 ENCOUNTER — Ambulatory Visit (INDEPENDENT_AMBULATORY_CARE_PROVIDER_SITE_OTHER): Payer: Medicare Other | Admitting: Vascular Surgery

## 2012-02-13 ENCOUNTER — Encounter: Payer: Self-pay | Admitting: Vascular Surgery

## 2012-02-13 VITALS — BP 166/69 | HR 94 | Resp 20 | Ht 68.0 in | Wt 198.0 lb

## 2012-02-13 DIAGNOSIS — N184 Chronic kidney disease, stage 4 (severe): Secondary | ICD-10-CM

## 2012-02-13 DIAGNOSIS — N186 End stage renal disease: Secondary | ICD-10-CM | POA: Insufficient documentation

## 2012-02-13 DIAGNOSIS — Z01810 Encounter for preprocedural cardiovascular examination: Secondary | ICD-10-CM

## 2012-02-13 NOTE — Progress Notes (Signed)
Bilateral upper extremity vein mapping performed @ [facility] 02/13/2012

## 2012-02-13 NOTE — Progress Notes (Signed)
Vascular and Vein Specialist of [facility]  Patient name: Troy [name] MRN: [id number] DOB: Dec 01, 1940 Sex: male  REASON FOR CONSULT: Evaluate for hemodialysis access. Referred by Mercy Moore   [id number] Troy [name] is a 71 y.o. male who is not yet on dialysis. 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 He is right-handed. He has had no recent uremic symptoms. Specifically he denies nausea, vomiting, fatigue, palpitations, or anorexia. He was referred for evaluation for access.  Past Medical History  Diagnosis Date  . Arthritis   . Cataract   . Hyperlipidemia   . Hypertension   . Kidney calculi   . Stroke     right eye vision poor    Family History  Problem Relation Age of Onset  . Colon polyps Maternal Uncle   . Hypertension Mother   . Heart disease Mother   . Heart disease Father     SOCIAL HISTORY: History  Substance Use Topics  . Smoking status: Former Smoker -- 3 years    Types: Cigars    Quit date: 02/13/2008  . Smokeless tobacco: Never Used  . Alcohol Use: No    Allergies  Allergen Reactions  . Tetanus Toxoids Anaphylaxis  . Penicillins Other (See Comments)    Passed out  . Sulfa Antibiotics     Current Outpatient Prescriptions  Medication Sig Dispense Refill  . amLODipine (NORVASC) 10 MG tablet Take 1 tablet by mouth Daily.      Marland Kitchen aspirin 325 MG tablet Take 325 mg by mouth daily.      Marland Kitchen atorvastatin (LIPITOR) 40 MG tablet Take 1 tablet by mouth Daily.      Marland Kitchen b complex vitamins tablet Take 1 tablet by mouth daily.      . fenofibrate 160 MG tablet Take 1 tablet by mouth Daily.      . furosemide (LASIX) 40 MG tablet Take 1 tablet by mouth 3 (three) times daily.       Marland Kitchen labetalol (NORMODYNE) 100 MG tablet Take 200 mg by mouth Daily.      . polyethylene glycol (MIRALAX / GLYCOLAX) packet Take 17 g by mouth as needed.      . sertraline (ZOLOFT) 50 MG tablet Take 1 tablet by mouth Daily.      Marland Kitchen acetaminophen (TYLENOL) 325  MG tablet Take 2 tablets (650 mg total) by mouth every 6 (six) hours as needed for pain or fever (or Fever >/= 101).      . CINNAMON PO Take 1 capsule by mouth daily.          REVIEW OF SYSTEMS: Valu.Nieves ] denotes positive finding; [  ] denotes negative finding  CARDIOVASCULAR:  [ ]  chest pain   [ ]  chest pressure   [ ]  palpitations   [ ]  orthopnea   [ ]  dyspnea on exertion   [ ]  claudication   [ ]  rest pain   [ ]  DVT   [ ]  phlebitis PULMONARY:   [ ]  productive cough   [ ]  asthma   [ ]  wheezing NEUROLOGIC:   [ ]  weakness  [ ]  paresthesias  [ ]  aphasia  [ ]  amaurosis  [ ]  dizziness HEMATOLOGIC:   [ ]  bleeding problems   [ ]  clotting disorders MUSCULOSKELETAL:  [ ]  joint pain   [ ]  joint swelling [ ]  leg swelling GASTROINTESTINAL: [ ]   blood in stool  [ ]   hematemesis GENITOURINARY:  [ ]   dysuria  [ ]   hematuria PSYCHIATRIC:  [ ]  history of major depression INTEGUMENTARY:  [ ]  rashes  [ ]  ulcers CONSTITUTIONAL:  [ ]  fever   [ ]  chills  PHYSICAL EXAM: Filed Vitals:   02/13/12 1529  BP: 166/69  Pulse: 94  Resp: 20  Height: 5\' 8"  (1.727 m)  Weight: 198 lb (89.812 kg)   Body mass index is 30.11 kg/(m^2). GENERAL: The patient is a well-nourished male, in no acute distress. The vital signs are documented above. CARDIOVASCULAR: There is a regular rate and rhythm without significant murmur appreciated. I do not detect carotid bruits. He has palpable brachial and radial pulses bilaterally. PULMONARY: There is good air exchange bilaterally without wheezing or rales. ABDOMEN: Soft and non-tender with normal pitched bowel sounds.  MUSCULOSKELETAL: There are no major deformities or cyanosis. NEUROLOGIC: No focal weakness or paresthesias are detected. SKIN: There are no ulcers or rashes noted. PSYCHIATRIC: The patient has a normal affect.  DATA:  I have independently interpreted his vein mapping which shows a reasonable forearm cephalic vein on the left and a good sized basilic vein on the left. The  upper arm cephalic vein narrows down to 0.15 cm in the distal brachium.  I have reviewed his records from Dr. Etheleen Nicks office. 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  MEDICAL ISSUES: I have recommended that we explore his forearm cephalic vein on the left. If this is not adequate he could potentially have a brachiocephalic fistula or a basilic vein transposition on the left.I have explained the indications for placement of an AV fistula or AV graft. I've explained that if at all possible we will place an AV fistula.  I have reviewed the risks of placement of an AV fistula including but not limited to: failure of the fistula to mature, need for subsequent interventions, and thrombosis. In addition I have reviewed the potential complications of placement of an AV graft. These risks include, but are not limited to, graft thrombosis, graft infection, wound healing problems, bleeding, arm swelling, and steal syndrome. All the patient's questions were answered and they are agreeable to proceed with surgery. The surgery is scheduled for 02/28/12.   [place] [facility] [place] Beeper: [phone]

## 2012-02-14 ENCOUNTER — Encounter: Payer: Self-pay | Admitting: Vascular Surgery

## 2012-02-19 ENCOUNTER — Encounter (HOSPITAL_COMMUNITY): Payer: Self-pay | Admitting: Pharmacy Technician

## 2012-02-19 ENCOUNTER — Other Ambulatory Visit: Payer: Self-pay | Admitting: *Deleted

## 2012-02-19 ENCOUNTER — Encounter: Payer: Self-pay | Admitting: *Deleted

## 2012-02-20 ENCOUNTER — Encounter (HOSPITAL_COMMUNITY)
Admission: RE | Admit: 2012-02-20 | Discharge: 2012-02-20 | Disposition: A | Discharge status: Home / Self Care | Payer: Medicare Other | Source: Ambulatory Visit | Attending: Nephrology | Admitting: Nephrology

## 2012-02-20 DIAGNOSIS — D638 Anemia in other chronic diseases classified elsewhere: Secondary | ICD-10-CM | POA: Insufficient documentation

## 2012-02-20 DIAGNOSIS — N184 Chronic kidney disease, stage 4 (severe): Secondary | ICD-10-CM | POA: Insufficient documentation

## 2012-02-20 MED ORDER — EPOETIN ALFA 20000 UNIT/ML IJ SOLN: 20000.0000 [IU] | INTRAMUSCULAR | Status: DC

## 2012-02-25 ENCOUNTER — Encounter (HOSPITAL_COMMUNITY): Payer: Self-pay

## 2012-02-25 ENCOUNTER — Encounter (HOSPITAL_COMMUNITY)
Admission: RE | Admit: 2012-02-25 | Discharge: 2012-02-25 | Disposition: A | Discharge status: Home / Self Care | Payer: Medicare Other | Source: Ambulatory Visit | Attending: Vascular Surgery | Admitting: Vascular Surgery

## 2012-02-25 HISTORY — DX: Anemia, unspecified: D64.9

## 2012-02-25 LAB — SURGICAL PCR SCREEN
MRSA, PCR: NEGATIVE
Staphylococcus aureus: POSITIVE — AB

## 2012-02-25 NOTE — Pre-Procedure Instructions (Signed)
[place]   02/25/2012   Your procedure is scheduled on:  August 22nd, Thursday   Report to [place] at 5:30 AM.  Call this number if you have problems the morning of surgery: [phone]   Remember:   Do not eat food or drink any liquids:After Midnight Wednesday .  Take these medicines the morning of surgery with A SIP OF WATER: Labetalol   Do not wear jewelry, make-up or nail polish.  Do not wear lotions, powders, or perfumes. You may NOT wear deodorant.   Do not shave 48 hours prior to surgery. Men may shave face and neck.   Do not bring valuables to the hospital.   Contacts, dentures or bridgework may not be worn into surgery.  Leave suitcase in the car. After surgery it may be brought to your room.  For patients admitted to the hospital, checkout time is 11:00 AM the day of discharge.   Patients discharged the day of surgery will not be allowed to drive home.  Name and phone number of your driver:   Special Instructions: CHG Shower Use Special Wash: 1/2 bottle night before surgery and 1/2 bottle morning of surgery.   Please read over the following fact sheets that you were given: Pain Booklet, Coughing and Deep Breathing, MRSA Information and Surgical Site Infection Prevention

## 2012-02-27 MED ORDER — VANCOMYCIN HCL IN DEXTROSE 1-5 GM/200ML-% IV SOLN
1000.0000 mg | INTRAVENOUS | Status: AC
  Administered 2012-02-28: 1000 mg via INTRAVENOUS
  Filled 2012-02-27: qty 200

## 2012-02-28 ENCOUNTER — Encounter (HOSPITAL_COMMUNITY)
Admission: RE | Disposition: A | Discharge status: Home / Self Care | Payer: Self-pay | Source: Ambulatory Visit | Attending: Vascular Surgery

## 2012-02-28 ENCOUNTER — Telehealth: Payer: Self-pay | Admitting: Vascular Surgery

## 2012-02-28 ENCOUNTER — Encounter (HOSPITAL_COMMUNITY): Payer: Self-pay | Admitting: *Deleted

## 2012-02-28 ENCOUNTER — Encounter (HOSPITAL_COMMUNITY): Payer: Self-pay | Admitting: Anesthesiology

## 2012-02-28 ENCOUNTER — Ambulatory Visit (HOSPITAL_COMMUNITY): Payer: Medicare Other | Admitting: Anesthesiology

## 2012-02-28 ENCOUNTER — Ambulatory Visit (HOSPITAL_COMMUNITY)
Admission: RE | Admit: 2012-02-28 | Discharge: 2012-02-28 | Disposition: A | Discharge status: Home / Self Care | Payer: Medicare Other | Source: Ambulatory Visit | Attending: Vascular Surgery | Admitting: Vascular Surgery

## 2012-02-28 DIAGNOSIS — N184 Chronic kidney disease, stage 4 (severe): Secondary | ICD-10-CM | POA: Insufficient documentation

## 2012-02-28 DIAGNOSIS — Z01812 Encounter for preprocedural laboratory examination: Secondary | ICD-10-CM | POA: Insufficient documentation

## 2012-02-28 DIAGNOSIS — I129 Hypertensive chronic kidney disease with stage 1 through stage 4 chronic kidney disease, or unspecified chronic kidney disease: Secondary | ICD-10-CM | POA: Insufficient documentation

## 2012-02-28 DIAGNOSIS — I69998 Other sequelae following unspecified cerebrovascular disease: Secondary | ICD-10-CM | POA: Insufficient documentation

## 2012-02-28 DIAGNOSIS — N186 End stage renal disease: Secondary | ICD-10-CM

## 2012-02-28 DIAGNOSIS — H547 Unspecified visual loss: Secondary | ICD-10-CM | POA: Insufficient documentation

## 2012-02-28 DIAGNOSIS — N189 Chronic kidney disease, unspecified: Secondary | ICD-10-CM

## 2012-02-28 HISTORY — PX: AV FISTULA PLACEMENT: SHX1204

## 2012-02-28 LAB — POCT I-STAT 4, (NA,K, GLUC, HGB,HCT)
Hemoglobin: 10.9 g/dL — ABNORMAL LOW (ref 13.0–17.0)
Potassium: 4.2 mEq/L (ref 3.5–5.1)

## 2012-02-28 SURGERY — ARTERIOVENOUS (AV) FISTULA CREATION
Anesthesia: General | Site: Arm Lower | Laterality: Left | Wound class: Clean

## 2012-02-28 MED ORDER — HYDROMORPHONE HCL PF 1 MG/ML IJ SOLN
INTRAMUSCULAR | Status: AC
  Filled 2012-02-28: qty 1

## 2012-02-28 MED ORDER — FENTANYL CITRATE 0.05 MG/ML IJ SOLN
INTRAMUSCULAR | Status: DC | PRN
  Administered 2012-02-28: 100 ug via INTRAVENOUS

## 2012-02-28 MED ORDER — MEPERIDINE HCL 25 MG/ML IJ SOLN: 6.2500 mg | INTRAMUSCULAR | Status: DC | PRN

## 2012-02-28 MED ORDER — LIDOCAINE HCL (CARDIAC) 20 MG/ML IV SOLN
INTRAVENOUS | Status: DC | PRN
  Administered 2012-02-28: 30 mg via INTRAVENOUS

## 2012-02-28 MED ORDER — SODIUM CHLORIDE 0.9 % IR SOLN
Status: DC | PRN
  Administered 2012-02-28: 10:00:00

## 2012-02-28 MED ORDER — HYDROMORPHONE HCL PF 1 MG/ML IJ SOLN
0.2500 mg | INTRAMUSCULAR | Status: DC | PRN
  Administered 2012-02-28: 0.5 mg via INTRAVENOUS

## 2012-02-28 MED ORDER — LIDOCAINE HCL (PF) 1 % IJ SOLN
INTRAMUSCULAR | Status: DC | PRN
  Administered 2012-02-28: 7 mL

## 2012-02-28 MED ORDER — SODIUM CHLORIDE 0.9 % IV SOLN: INTRAVENOUS | Status: DC

## 2012-02-28 MED ORDER — ONDANSETRON HCL 4 MG/2ML IJ SOLN
INTRAMUSCULAR | Status: DC | PRN
  Administered 2012-02-28: 4 mg via INTRAVENOUS

## 2012-02-28 MED ORDER — HEPARIN SODIUM (PORCINE) 1000 UNIT/ML IJ SOLN
INTRAMUSCULAR | Status: DC | PRN
  Administered 2012-02-28: 7 mL via INTRAVENOUS

## 2012-02-28 MED ORDER — LIDOCAINE HCL (PF) 1 % IJ SOLN
INTRAMUSCULAR | Status: AC
  Filled 2012-02-28: qty 30

## 2012-02-28 MED ORDER — MIDAZOLAM HCL 2 MG/2ML IJ SOLN: 0.5000 mg | Freq: Once | INTRAMUSCULAR | Status: DC | PRN

## 2012-02-28 MED ORDER — SODIUM CHLORIDE 0.9 % IV SOLN
INTRAVENOUS | Status: DC | PRN
  Administered 2012-02-28 (×2): via INTRAVENOUS

## 2012-02-28 MED ORDER — MIDAZOLAM HCL 5 MG/5ML IJ SOLN
INTRAMUSCULAR | Status: DC | PRN
  Administered 2012-02-28: 2 mg via INTRAVENOUS

## 2012-02-28 MED ORDER — PROTAMINE SULFATE 10 MG/ML IV SOLN
INTRAVENOUS | Status: DC | PRN
  Administered 2012-02-28: 40 mg via INTRAVENOUS

## 2012-02-28 MED ORDER — OXYCODONE-ACETAMINOPHEN 5-325 MG PO TABS: 1.0000 | ORAL_TABLET | ORAL | Status: AC | PRN

## 2012-02-28 MED ORDER — PROPOFOL 10 MG/ML IV EMUL
INTRAVENOUS | Status: DC | PRN
  Administered 2012-02-28: 150 mg via INTRAVENOUS

## 2012-02-28 MED ORDER — PROMETHAZINE HCL 25 MG/ML IJ SOLN: 6.2500 mg | INTRAMUSCULAR | Status: DC | PRN

## 2012-02-28 MED ORDER — 0.9 % SODIUM CHLORIDE (POUR BTL) OPTIME
TOPICAL | Status: DC | PRN
  Administered 2012-02-28: 1000 mL

## 2012-02-28 SURGICAL SUPPLY — 38 items
ADH SKN CLS APL DERMABOND .7 (GAUZE/BANDAGES/DRESSINGS) ×1
CANISTER SUCTION 2500CC (MISCELLANEOUS) ×2 IMPLANT
CLIP TI MEDIUM 6 (CLIP) ×2 IMPLANT
CLIP TI WIDE RED SMALL 6 (CLIP) ×2 IMPLANT
CLOTH BEACON ORANGE TIMEOUT ST (SAFETY) ×2 IMPLANT
COVER PROBE W GEL 5X96 (DRAPES) ×1 IMPLANT
COVER SURGICAL LIGHT HANDLE (MISCELLANEOUS) ×2 IMPLANT
DECANTER SPIKE VIAL GLASS SM (MISCELLANEOUS) ×2 IMPLANT
DERMABOND ADVANCED (GAUZE/BANDAGES/DRESSINGS) ×1
DERMABOND ADVANCED .7 DNX12 (GAUZE/BANDAGES/DRESSINGS) ×1 IMPLANT
DRAIN PENROSE 1/2X12 LTX STRL (WOUND CARE) IMPLANT
ELECT REM PT RETURN 9FT ADLT (ELECTROSURGICAL) ×2
ELECTRODE REM PT RTRN 9FT ADLT (ELECTROSURGICAL) ×1 IMPLANT
GLOVE BIO SURGEON STRL SZ7.5 (GLOVE) ×2 IMPLANT
GLOVE BIOGEL PI IND STRL 6.5 (GLOVE) IMPLANT
GLOVE BIOGEL PI IND STRL 7.5 (GLOVE) ×1 IMPLANT
GLOVE BIOGEL PI INDICATOR 6.5 (GLOVE) ×2
GLOVE BIOGEL PI INDICATOR 7.5 (GLOVE) ×3
GLOVE SS BIOGEL STRL SZ 7 (GLOVE) IMPLANT
GLOVE SUPERSENSE BIOGEL SZ 7 (GLOVE) ×1
GLOVE SURG SS PI 7.5 STRL IVOR (GLOVE) ×2 IMPLANT
GOWN PREVENTION PLUS XLARGE (GOWN DISPOSABLE) ×1 IMPLANT
GOWN STRL NON-REIN LRG LVL3 (GOWN DISPOSABLE) ×4 IMPLANT
KIT BASIN OR (CUSTOM PROCEDURE TRAY) ×2 IMPLANT
KIT ROOM TURNOVER OR (KITS) ×2 IMPLANT
NS IRRIG 1000ML POUR BTL (IV SOLUTION) ×2 IMPLANT
PACK CV ACCESS (CUSTOM PROCEDURE TRAY) ×2 IMPLANT
PAD ARMBOARD 7.5X6 YLW CONV (MISCELLANEOUS) ×4 IMPLANT
SPONGE GAUZE 4X4 12PLY (GAUZE/BANDAGES/DRESSINGS) ×2 IMPLANT
SPONGE SURGIFOAM ABS GEL 100 (HEMOSTASIS) IMPLANT
SUT PROLENE 6 0 BV (SUTURE) ×2 IMPLANT
SUT VIC AB 3-0 SH 27 (SUTURE) ×2
SUT VIC AB 3-0 SH 27X BRD (SUTURE) ×1 IMPLANT
SUT VICRYL 4-0 PS2 18IN ABS (SUTURE) ×2 IMPLANT
TOWEL OR 17X24 6PK STRL BLUE (TOWEL DISPOSABLE) ×2 IMPLANT
TOWEL OR 17X26 10 PK STRL BLUE (TOWEL DISPOSABLE) ×2 IMPLANT
UNDERPAD 30X30 INCONTINENT (UNDERPADS AND DIAPERS) ×2 IMPLANT
WATER STERILE IRR 1000ML POUR (IV SOLUTION) ×2 IMPLANT

## 2012-02-28 NOTE — Anesthesia Postprocedure Evaluation (Signed)
  Anesthesia Post-op Note  Patient: Troy [name]  Procedure(s) Performed: Procedure(s) (LRB): ARTERIOVENOUS (AV) FISTULA CREATION (Left)  Patient Location: PACU  Anesthesia Type: General  Level of Consciousness: awake, alert  and oriented  Airway and Oxygen Therapy: Patient Spontanous Breathing  Post-op Pain: none  Post-op Assessment: Post-op Vital signs reviewed, Patient's Cardiovascular Status Stable, Respiratory Function Stable, Patent Airway, No signs of Nausea or vomiting and Pain level controlled  Post-op Vital Signs: Reviewed and stable  Complications: No apparent anesthesia complications

## 2012-02-28 NOTE — Anesthesia Procedure Notes (Signed)
Procedure Name: LMA Insertion Date/Time: 02/28/2012 10:14 AM Performed by: Carter Kitten Pre-anesthesia Checklist: Patient identified, Timeout performed, Emergency Drugs available, Suction available and Patient being monitored Patient Re-evaluated:Patient Re-evaluated prior to inductionOxygen Delivery Method: Circle system utilized Preoxygenation: Pre-oxygenation with 100% oxygen Intubation Type: IV induction LMA: LMA inserted LMA Size: 5.0 Number of attempts: 1 Placement Confirmation: positive ETCO2 and breath sounds checked- equal and bilateral Tube secured with: Tape Dental Injury: Teeth and Oropharynx as per pre-operative assessment

## 2012-02-28 NOTE — Transfer of Care (Signed)
Immediate Anesthesia Transfer of Care Note  Patient: Troy [name]  Procedure(s) Performed: Procedure(s) (LRB): ARTERIOVENOUS (AV) FISTULA CREATION (Left)  Patient Location: PACU  Anesthesia Type: General  Level of Consciousness: awake and alert   Airway & Oxygen Therapy: Patient Spontanous Breathing and Patient connected to nasal cannula oxygen  Post-op Assessment: Report given to PACU RN and Post -op Vital signs reviewed and stable  Post vital signs: Reviewed and stable  Complications: No apparent anesthesia complications

## 2012-02-28 NOTE — Telephone Encounter (Signed)
Message copied by Gena Fray on Thu Feb 28, 2012  2:35 PM ------      Message from: Alfonso Patten      Created: Thu Feb 28, 2012 12:48 PM      Regarding: FW: charge and F/U                   ----- Message -----         From: Angelia Mould, MD         Sent: 02/28/2012  11:22 AM           To: Patrici Ranks, Alfonso Patten, RN      Subject: charge and F/U                                                       PROCEDURE: Left radial cephalic AV fistula                  SURGEON: Judeth Cornfield. Scot Dock, MD, FACS                  ASSIST: Gerri Lins PA                  He needs a follow up visit in 6 weeks to check on the maturation of this fistula. He should have a duplex when he comes. Thank you. CSD

## 2012-02-28 NOTE — Op Note (Signed)
NAME: [name]   MRN: [id number] DOB: May 05, 1941    DATE OF OPERATION: 02/28/2012  PREOP DIAGNOSIS: Chronic kidney disease  POSTOP DIAGNOSIS: Same  PROCEDURE: Left radial cephalic AV fistula  SURGEON: Judeth Cornfield. Scot Dock, MD, FACS  ASSIST: Gerri Lins PA  ANESTHESIA: Gen.   EBL: minimal  INDICATIONS: Troy [name] is a 71 y.o. male who is not yet on dialysis. He presents for elective access.  FINDINGS: 4 mm cephalic vein.  TECHNIQUE: The patient was brought to the operating room and received a general anesthetic. The left upper extremity was prepped and draped in the usual sterile fashion. An oblique incision was made in the left wrist and through this incision the cephalic vein was dissected free. It was ligated distally and then irrigated up nicely with heparinized saline. It was an approximately 4 mm vein. Next the radial artery was dissected free beneath the fascia. The patient was heparinized. The radial artery was clamped proximally and distally and a longitudinal arteriotomy was made. The vein was spatulated and sewn end-to-side to the artery using continuous 6-0 Prolene suture. A 2-1/2 mm dilator was passed through the anastomosis and both ends. The anastomosis was completed. There was an excellent thrill in the fistula. The heparin was partially reversed with protamine. The wounds closed the deep layer of 3-0 Vicryl and the skin closed with 4-0 Vicryl. Dermabond was applied. Patient tolerated the procedure well and was transferred to the recovery room in stable condition. All needle and sponge counts were correct.  [name], MD, FACS Vascular and Vein Specialists of [facility]  DATE OF DICTATION:   02/28/2012

## 2012-02-28 NOTE — Anesthesia Preprocedure Evaluation (Addendum)
Anesthesia Evaluation  Patient identified by MRN, date of birth, ID band Patient awake    Reviewed: Allergy & Precautions, H&P , NPO status , Patient's Chart, lab work & pertinent test results, reviewed documented beta blocker date and time   History of Anesthesia Complications Negative for: history of anesthetic complications  Airway Mallampati: I TM Distance: >3 FB Neck ROM: Full    Dental  (+) Edentulous Upper, Edentulous Lower and Dental Advisory Given   Pulmonary former smoker (quit 4 years),  breath sounds clear to auscultation  Pulmonary exam normal       Cardiovascular hypertension, Pt. on medications and Pt. 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)     Musculoskeletal   Abdominal   Peds  Hematology   Anesthesia Other Findings   Reproductive/Obstetrics                          Anesthesia Physical Anesthesia Plan  ASA: III  Anesthesia Plan: General   Post-op Pain Management:    Induction: Intravenous  Airway Management Planned: LMA  Additional Equipment:   Intra-op Plan:   Post-operative Plan:   Informed Consent: I have reviewed the patients History and Physical, chart, labs and discussed the procedure including the risks, benefits and alternatives for the proposed anesthesia with the patient or authorized representative who has indicated his/her understanding and acceptance.   Dental advisory given  Plan Discussed with: CRNA and Surgeon  Anesthesia Plan Comments: (Plan routine monitors, GA- LMA OK)       Anesthesia Quick Evaluation

## 2012-02-28 NOTE — Telephone Encounter (Signed)
Spoke with pt to notify.

## 2012-02-28 NOTE — Interval H&P Note (Signed)
History and Physical Interval Note:  02/28/2012 9:51 AM  Troy [name]  has presented today for surgery, with the diagnosis of ESRD  The various methods of treatment have been discussed with the patient and family. After consideration of risks, benefits and other options for treatment, the patient has consented to: ARTERIOVENOUS (AV) FISTULA CREATION (Left) as a surgical intervention .  The patient's history has been reviewed, patient examined, no change in status, stable for surgery.  I have reviewed the patient's chart and labs.  Questions were answered to the patient's satisfaction.     DICKSON,CHRISTOPHER S

## 2012-02-28 NOTE — Preoperative (Signed)
Beta Blockers   Reason not to administer Beta Blockers:Not Applicable. Labetalol @ 0430 02/28/12

## 2012-02-28 NOTE — H&P (View-Only) (Signed)
Vascular and Vein Specialist of [facility]  Patient name: Troy [name] MRN: [id number] DOB: [birth year] Sex: male  REASON FOR CONSULT: Evaluate for hemodialysis access. Referred by Mercy Moore   [id number] Troy [name] is a 71 y.o. male who is not yet on dialysis. 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 He is right-handed. He has had no recent uremic symptoms. Specifically he denies nausea, vomiting, fatigue, palpitations, or anorexia. He was referred for evaluation for access.  Past Medical History  Diagnosis Date  . Arthritis   . Cataract   . Hyperlipidemia   . Hypertension   . Kidney calculi   . Stroke     right eye vision poor    Family History  Problem Relation Age of Onset  . Colon polyps Maternal Uncle   . Hypertension Mother   . Heart disease Mother   . Heart disease Father     SOCIAL HISTORY: History  Substance Use Topics  . Smoking status: Former Smoker -- 3 years    Types: Cigars    Quit date: 02/13/2008  . Smokeless tobacco: Never Used  . Alcohol Use: No    Allergies  Allergen Reactions  . Tetanus Toxoids Anaphylaxis  . Penicillins Other (See Comments)    Passed out  . Sulfa Antibiotics     Current Outpatient Prescriptions  Medication Sig Dispense Refill  . amLODipine (NORVASC) 10 MG tablet Take 1 tablet by mouth Daily.      Marland Kitchen aspirin 325 MG tablet Take 325 mg by mouth daily.      Marland Kitchen atorvastatin (LIPITOR) 40 MG tablet Take 1 tablet by mouth Daily.      Marland Kitchen b complex vitamins tablet Take 1 tablet by mouth daily.      . fenofibrate 160 MG tablet Take 1 tablet by mouth Daily.      . furosemide (LASIX) 40 MG tablet Take 1 tablet by mouth 3 (three) times daily.       Marland Kitchen labetalol (NORMODYNE) 100 MG tablet Take 200 mg by mouth Daily.      . polyethylene glycol (MIRALAX / GLYCOLAX) packet Take 17 g by mouth as needed.      . sertraline (ZOLOFT) 50 MG tablet Take 1 tablet by mouth Daily.      Marland Kitchen acetaminophen (TYLENOL) 325  MG tablet Take 2 tablets (650 mg total) by mouth every 6 (six) hours as needed for pain or fever (or Fever >/= 101).      . CINNAMON PO Take 1 capsule by mouth daily.          REVIEW OF SYSTEMS: Valu.Nieves ] denotes positive finding; [  ] denotes negative finding  CARDIOVASCULAR:  [ ]  chest pain   [ ]  chest pressure   [ ]  palpitations   [ ]  orthopnea   [ ]  dyspnea on exertion   [ ]  claudication   [ ]  rest pain   [ ]  DVT   [ ]  phlebitis PULMONARY:   [ ]  productive cough   [ ]  asthma   [ ]  wheezing NEUROLOGIC:   [ ]  weakness  [ ]  paresthesias  [ ]  aphasia  [ ]  amaurosis  [ ]  dizziness HEMATOLOGIC:   [ ]  bleeding problems   [ ]  clotting disorders MUSCULOSKELETAL:  [ ]  joint pain   [ ]  joint swelling [ ]  leg swelling GASTROINTESTINAL: [ ]   blood in stool  [ ]   hematemesis GENITOURINARY:  [ ]   dysuria  [ ]   hematuria PSYCHIATRIC:  [ ]  history of major depression INTEGUMENTARY:  [ ]  rashes  [ ]  ulcers CONSTITUTIONAL:  [ ]  fever   [ ]  chills  PHYSICAL EXAM: Filed Vitals:   02/13/12 1529  BP: 166/69  Pulse: 94  Resp: 20  Height: 5\' 8"  (1.727 m)  Weight: 198 lb (89.812 kg)   Body mass index is 30.11 kg/(m^2). GENERAL: The patient is a well-nourished male, in no acute distress. The vital signs are documented above. CARDIOVASCULAR: There is a regular rate and rhythm without significant murmur appreciated. I do not detect carotid bruits. He has palpable brachial and radial pulses bilaterally. PULMONARY: There is good air exchange bilaterally without wheezing or rales. ABDOMEN: Soft and non-tender with normal pitched bowel sounds.  MUSCULOSKELETAL: There are no major deformities or cyanosis. NEUROLOGIC: No focal weakness or paresthesias are detected. SKIN: There are no ulcers or rashes noted. PSYCHIATRIC: The patient has a normal affect.  DATA:  I have independently interpreted his vein mapping which shows a reasonable forearm cephalic vein on the left and a good sized basilic vein on the left. The  upper arm cephalic vein narrows down to 0.15 cm in the distal brachium.  I have reviewed his records from Dr. Etheleen Nicks office. 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  MEDICAL ISSUES: I have recommended that we explore his forearm cephalic vein on the left. If this is not adequate he could potentially have a brachiocephalic fistula or a basilic vein transposition on the left.I have explained the indications for placement of an AV fistula or AV graft. I've explained that if at all possible we will place an AV fistula.  I have reviewed the risks of placement of an AV fistula including but not limited to: failure of the fistula to mature, need for subsequent interventions, and thrombosis. In addition I have reviewed the potential complications of placement of an AV graft. These risks include, but are not limited to, graft thrombosis, graft infection, wound healing problems, bleeding, arm swelling, and steal syndrome. All the patient's questions were answered and they are agreeable to proceed with surgery. The surgery is scheduled for 02/28/12.   [place] [facility] of [place] Beeper: [phone]

## 2012-02-29 ENCOUNTER — Encounter (HOSPITAL_COMMUNITY): Payer: Self-pay | Admitting: Vascular Surgery

## 2012-02-29 ENCOUNTER — Other Ambulatory Visit: Payer: Self-pay | Admitting: *Deleted

## 2012-02-29 DIAGNOSIS — N186 End stage renal disease: Secondary | ICD-10-CM

## 2012-02-29 DIAGNOSIS — Z48812 Encounter for surgical aftercare following surgery on the circulatory system: Secondary | ICD-10-CM

## 2012-03-04 ENCOUNTER — Other Ambulatory Visit (HOSPITAL_COMMUNITY): Payer: Self-pay | Admitting: *Deleted

## 2012-03-05 ENCOUNTER — Encounter (HOSPITAL_COMMUNITY)
Admission: RE | Admit: 2012-03-05 | Discharge: 2012-03-05 | Disposition: A | Discharge status: Home / Self Care | Payer: Medicare Other | Source: Ambulatory Visit | Attending: Nephrology | Admitting: Nephrology

## 2012-03-05 LAB — POCT HEMOGLOBIN-HEMACUE: Hemoglobin: 12.8 g/dL — ABNORMAL LOW (ref 13.0–17.0)

## 2012-03-05 MED ORDER — EPOETIN ALFA 20000 UNIT/ML IJ SOLN: 20000.0000 [IU] | INTRAMUSCULAR | Status: DC

## 2012-03-19 ENCOUNTER — Encounter (HOSPITAL_COMMUNITY)
Admission: RE | Admit: 2012-03-19 | Discharge: 2012-03-19 | Disposition: A | Discharge status: Home / Self Care | Payer: Medicare Other | Source: Ambulatory Visit | Attending: Nephrology | Admitting: Nephrology

## 2012-03-19 DIAGNOSIS — D638 Anemia in other chronic diseases classified elsewhere: Secondary | ICD-10-CM | POA: Insufficient documentation

## 2012-03-19 DIAGNOSIS — N184 Chronic kidney disease, stage 4 (severe): Secondary | ICD-10-CM | POA: Insufficient documentation

## 2012-03-19 MED ORDER — EPOETIN ALFA 20000 UNIT/ML IJ SOLN
20000.0000 [IU] | INTRAMUSCULAR | Status: DC
  Administered 2012-03-19: 20000 [IU] via SUBCUTANEOUS
  Filled 2012-03-19: qty 1

## 2012-04-02 ENCOUNTER — Encounter (HOSPITAL_COMMUNITY): Payer: Medicare Other

## 2012-04-08 ENCOUNTER — Other Ambulatory Visit (HOSPITAL_COMMUNITY): Payer: Self-pay | Admitting: *Deleted

## 2012-04-09 ENCOUNTER — Encounter (HOSPITAL_COMMUNITY)
Admission: RE | Admit: 2012-04-09 | Discharge: 2012-04-09 | Disposition: A | Discharge status: Home / Self Care | Payer: Medicare Other | Source: Ambulatory Visit | Attending: Nephrology | Admitting: Nephrology

## 2012-04-09 DIAGNOSIS — D638 Anemia in other chronic diseases classified elsewhere: Secondary | ICD-10-CM | POA: Insufficient documentation

## 2012-04-09 DIAGNOSIS — N184 Chronic kidney disease, stage 4 (severe): Secondary | ICD-10-CM | POA: Insufficient documentation

## 2012-04-09 LAB — IRON AND TIBC
Iron: 145 ug/dL — ABNORMAL HIGH (ref 42–135)
TIBC: 417 ug/dL (ref 215–435)

## 2012-04-09 MED ORDER — EPOETIN ALFA 20000 UNIT/ML IJ SOLN: 20000.0000 [IU] | INTRAMUSCULAR | Status: DC

## 2012-04-15 ENCOUNTER — Encounter: Payer: Self-pay | Admitting: Vascular Surgery

## 2012-04-16 ENCOUNTER — Encounter (INDEPENDENT_AMBULATORY_CARE_PROVIDER_SITE_OTHER): Payer: Medicare Other | Admitting: *Deleted

## 2012-04-16 ENCOUNTER — Encounter: Payer: Self-pay | Admitting: Vascular Surgery

## 2012-04-16 ENCOUNTER — Ambulatory Visit (INDEPENDENT_AMBULATORY_CARE_PROVIDER_SITE_OTHER): Payer: Medicare Other | Admitting: Vascular Surgery

## 2012-04-16 VITALS — BP 170/70 | HR 57 | Resp 18 | Ht 68.0 in | Wt 203.1 lb

## 2012-04-16 DIAGNOSIS — T82598A Other mechanical complication of other cardiac and vascular devices and implants, initial encounter: Secondary | ICD-10-CM

## 2012-04-16 DIAGNOSIS — N186 End stage renal disease: Secondary | ICD-10-CM

## 2012-04-16 DIAGNOSIS — Z48812 Encounter for surgical aftercare following surgery on the circulatory system: Secondary | ICD-10-CM

## 2012-04-16 DIAGNOSIS — Z4931 Encounter for adequacy testing for hemodialysis: Secondary | ICD-10-CM

## 2012-04-16 NOTE — Progress Notes (Signed)
Vascular and Vein Specialist of [facility]  Patient name: Troy [name] MRN: [id number] DOB: [birth year] Sex: male  REASON FOR VISIT: follow up after left radiocephalic AV fistula.  HPI: Troy [name] is a 71 y.o. male had a left radiocephalic AV fistula placed on 02/28/12. It was noted to be a 4 mm vein. He is not yet on dialysis. He has had no pain or paresthesias associated with his fistula in the left arm.   REVIEW OF SYSTEMS: Valu.Nieves ] denotes positive finding; [  ] denotes negative finding  CARDIOVASCULAR:  [ ]  chest pain   [ ]  dyspnea on exertion    CONSTITUTIONAL:  [ ]  fever   [ ]  chills  PHYSICAL EXAM: Filed Vitals:   04/16/12 1407  BP: 170/70  Pulse: 57  Resp: 18  Height: 5\' 8"  (1.727 m)  Weight: 203 lb 1.6 oz (92.126 kg)   Body mass index is 30.88 kg/(m^2). GENERAL: The patient is a well-nourished male, in no acute distress. The vital signs are documented above. CARDIOVASCULAR: There is a regular rate and rhythm  PULMONARY: There is good air exchange bilaterally without wheezing or rales. His left radiocephalic fistula has an excellent thrill. It appears to be gradually increasing in size  I have independently interpreted his duplex scan. This shows a diameters range from 0.4 6.65 cm in the left forearm. Depths ranged from 0.26 2.43 cm. There is an area of increased velocity in the proximal fistula however this is the site of the valve.  MEDICAL ISSUES:  End stage renal disease His left radiocephalic fistula appears to be maturing nicely. It has increased in size. Diameters range from 0.46-0.65 cm. Depths are very reasonable. Duplex they show an area of increased callosity the proximal fistula however this appears to be a valve site. There is no pulsatility proximal to this to suggest that this is a significant stenosis. I will see him back in one month for follow duplex scan. He is not yet on dialysis.   [place] [facility] of  [place] Beeper: [phone]

## 2012-04-16 NOTE — Assessment & Plan Note (Signed)
His left radiocephalic fistula appears to be maturing nicely. It has increased in size. Diameters range from 0.46-0.65 cm. Depths are very reasonable. Duplex they show an area of increased callosity the proximal fistula however this appears to be a valve site. There is no pulsatility proximal to this to suggest that this is a significant stenosis. I will see him back in one month for follow duplex scan. He is not yet on dialysis.

## 2012-04-17 NOTE — Addendum Note (Signed)
Addended by: Mena Goes on: 04/17/2012 08:25 AM   Modules accepted: Orders

## 2012-04-23 ENCOUNTER — Encounter (HOSPITAL_COMMUNITY)
Admission: RE | Admit: 2012-04-23 | Discharge: 2012-04-23 | Disposition: A | Discharge status: Home / Self Care | Payer: Medicare Other | Source: Ambulatory Visit | Attending: Nephrology | Admitting: Nephrology

## 2012-04-23 LAB — POCT HEMOGLOBIN-HEMACUE: Hemoglobin: 11.9 g/dL — ABNORMAL LOW (ref 13.0–17.0)

## 2012-04-23 MED ORDER — EPOETIN ALFA 20000 UNIT/ML IJ SOLN
20000.0000 [IU] | INTRAMUSCULAR | Status: DC
  Administered 2012-04-23: 20000 [IU] via SUBCUTANEOUS

## 2012-04-23 MED ORDER — EPOETIN ALFA 20000 UNIT/ML IJ SOLN
INTRAMUSCULAR | Status: AC
  Filled 2012-04-23: qty 1

## 2012-05-13 ENCOUNTER — Encounter (HOSPITAL_COMMUNITY)
Admission: RE | Admit: 2012-05-13 | Discharge: 2012-05-13 | Disposition: A | Discharge status: Home / Self Care | Payer: Medicare Other | Source: Ambulatory Visit | Attending: Nephrology | Admitting: Nephrology

## 2012-05-13 ENCOUNTER — Encounter: Payer: Self-pay | Admitting: Vascular Surgery

## 2012-05-13 DIAGNOSIS — D638 Anemia in other chronic diseases classified elsewhere: Secondary | ICD-10-CM | POA: Insufficient documentation

## 2012-05-13 DIAGNOSIS — N184 Chronic kidney disease, stage 4 (severe): Secondary | ICD-10-CM | POA: Insufficient documentation

## 2012-05-13 LAB — POCT HEMOGLOBIN-HEMACUE: Hemoglobin: 12 g/dL — ABNORMAL LOW (ref 13.0–17.0)

## 2012-05-13 MED ORDER — EPOETIN ALFA 20000 UNIT/ML IJ SOLN: 20000.0000 [IU] | INTRAMUSCULAR | Status: DC

## 2012-05-14 ENCOUNTER — Ambulatory Visit (INDEPENDENT_AMBULATORY_CARE_PROVIDER_SITE_OTHER): Payer: Medicare Other | Admitting: Vascular Surgery

## 2012-05-14 ENCOUNTER — Other Ambulatory Visit: Payer: Self-pay

## 2012-05-14 ENCOUNTER — Encounter: Payer: Self-pay | Admitting: Vascular Surgery

## 2012-05-14 ENCOUNTER — Encounter (INDEPENDENT_AMBULATORY_CARE_PROVIDER_SITE_OTHER): Payer: Medicare Other | Admitting: *Deleted

## 2012-05-14 VITALS — BP 164/74 | HR 57 | Ht 68.0 in | Wt 202.0 lb

## 2012-05-14 DIAGNOSIS — N186 End stage renal disease: Secondary | ICD-10-CM

## 2012-05-14 DIAGNOSIS — Z4931 Encounter for adequacy testing for hemodialysis: Secondary | ICD-10-CM

## 2012-05-14 NOTE — Progress Notes (Signed)
Vascular and Vein Specialist of [facility]  Patient name: Troy [name] MRN: [id number] DOB: June 29, 1941 Sex: male  REASON FOR VISIT: follow up of left radiocephalic AV fistula  HPI: Troy [name] is a 71 y.o. male who had a left radiocephalic fistula placed on 02/28/12. He is not yet on dialysis. When I last saw him on 04/16/2012, diameters of the fistula range from 0.46-0.65. There was an area of increased callosity within the proximal fistula at the site of the valve but no significant stenosis was identified. I brought him back today for a one-month follow up visit and duplex scan.  Duplex scan today shows that the fistula diameters range from 0.45 to 0.58 cm. Thus the fistula has not enlarged significantly. There are elevated velocities in the proximal fistula which actually increased compared this study on his previous visit.   REVIEW OF SYSTEMS: Valu.Nieves ] denotes positive finding; [  ] denotes negative finding  CARDIOVASCULAR:  [ ]  chest pain   [ ]  dyspnea on exertion    CONSTITUTIONAL:  [ ]  fever   [ ]  chills  PHYSICAL EXAM: Filed Vitals:   05/14/12 1214  BP: 164/74  Pulse: 57  Height: 5\' 8"  (1.727 m)  Weight: 202 lb (91.627 kg)  SpO2: 100%   Body mass index is 30.71 kg/(m^2). GENERAL: The patient is a well-nourished male, in no acute distress. The vital signs are documented above. CARDIOVASCULAR: There is a regular rate and rhythm  PULMONARY: There is good air exchange bilaterally without wheezing or rales. The fistula is slightly pulsatile proximally. There is a palpable thrill throughout the forearm.  MEDICAL ISSUES: Given that the vein does not appear to be maturing adequately and he may have a proximal stenosis, I have recommended that we proceed with a fistulogram with limited contrast in order to evaluate for a proximal stenosis which could potentially be addressed with venoplasty. This has been scheduled for 06/02/2012.  [place] [facility] of  [place] Beeper: [phone]

## 2012-05-21 ENCOUNTER — Encounter (HOSPITAL_COMMUNITY): Payer: Self-pay | Admitting: Pharmacy Technician

## 2012-05-27 ENCOUNTER — Encounter (HOSPITAL_COMMUNITY)
Admission: RE | Admit: 2012-05-27 | Discharge: 2012-05-27 | Disposition: A | Discharge status: Home / Self Care | Payer: Medicare Other | Source: Ambulatory Visit | Attending: Nephrology | Admitting: Nephrology

## 2012-05-27 MED ORDER — EPOETIN ALFA 20000 UNIT/ML IJ SOLN: 20000.0000 [IU] | INTRAMUSCULAR | Status: DC

## 2012-06-02 ENCOUNTER — Ambulatory Visit (HOSPITAL_COMMUNITY)
Admission: RE | Admit: 2012-06-02 | Discharge: 2012-06-02 | Disposition: A | Discharge status: Home / Self Care | Payer: Medicare Other | Source: Ambulatory Visit | Attending: Vascular Surgery | Admitting: Vascular Surgery

## 2012-06-02 ENCOUNTER — Other Ambulatory Visit: Payer: Self-pay | Admitting: *Deleted

## 2012-06-02 ENCOUNTER — Encounter: Payer: Self-pay | Admitting: *Deleted

## 2012-06-02 ENCOUNTER — Encounter (HOSPITAL_COMMUNITY)
Admission: RE | Disposition: A | Discharge status: Home / Self Care | Payer: Self-pay | Source: Ambulatory Visit | Attending: Vascular Surgery

## 2012-06-02 DIAGNOSIS — Y832 Surgical operation with anastomosis, bypass or graft as the cause of abnormal reaction of the patient, or of later complication, without mention of misadventure at the time of the procedure: Secondary | ICD-10-CM | POA: Insufficient documentation

## 2012-06-02 DIAGNOSIS — N186 End stage renal disease: Secondary | ICD-10-CM | POA: Insufficient documentation

## 2012-06-02 DIAGNOSIS — T82898A Other specified complication of vascular prosthetic devices, implants and grafts, initial encounter: Secondary | ICD-10-CM

## 2012-06-02 DIAGNOSIS — T82598A Other mechanical complication of other cardiac and vascular devices and implants, initial encounter: Secondary | ICD-10-CM | POA: Insufficient documentation

## 2012-06-02 HISTORY — PX: FISTULOGRAM: SHX5832

## 2012-06-02 LAB — POCT I-STAT, CHEM 8
BUN: 55 mg/dL — ABNORMAL HIGH (ref 6–23)
Creatinine, Ser: 3.1 mg/dL — ABNORMAL HIGH (ref 0.50–1.35)
Glucose, Bld: 108 mg/dL — ABNORMAL HIGH (ref 70–99)
Hemoglobin: 11.2 g/dL — ABNORMAL LOW (ref 13.0–17.0)
Potassium: 4.1 mEq/L (ref 3.5–5.1)

## 2012-06-02 SURGERY — FISTULOGRAM
Anesthesia: LOCAL

## 2012-06-02 MED ORDER — HEPARIN (PORCINE) IN NACL 2-0.9 UNIT/ML-% IJ SOLN
INTRAMUSCULAR | Status: AC
  Filled 2012-06-02: qty 500

## 2012-06-02 MED ORDER — LIDOCAINE HCL (PF) 1 % IJ SOLN
INTRAMUSCULAR | Status: AC
  Filled 2012-06-02: qty 30

## 2012-06-02 MED ORDER — SODIUM CHLORIDE 0.9 % IJ SOLN: 3.0000 mL | INTRAMUSCULAR | Status: DC | PRN

## 2012-06-02 NOTE — Op Note (Signed)
PATIENT: Troy [name]   MRN: [id number] DOB: 14-Sep-1940    DATE OF PROCEDURE: 06/02/2012  INDICATIONS: [name] is a 71 y.o. male who had a left radial cephalic fistula placed on 02/28/12. Follow up duplex suggested that the fistula had a proximal stenosis. He is brought in for a fistulogram.  PROCEDURE:  1. Ultrasound-guided access to the left radiocephalic AV fistula 2. fistulogram left radiocephalic AV fistula  SURGEON: Judeth Cornfield. Scot Dock, MD, FACS  ANESTHESIA: local   EBL: minimal  TECHNIQUE: Patient was brought to the peripheral vascular lab. The left arm was prepped and draped in the usual sterile fashion after time out was performed. After the skin was anesthetized with 1% lidocaine, and under ultrasound guidance, the mid fistula was cannulated and a micropuncture sheath introduced over a wire. Fistula was compressed to allow reflux to demonstrate the arterial anastomosis. 2 additional films were obtained to demonstrate the fistula to the level of the chest to  FINDINGS:  1. I was unable to reflux contrast into the very proximal fistula because of valve which obstructed flow. 2. The cephalic vein empties into the basilic system. As one competing branches the antecubital level.  Clinical note: Given that the patient is not a candidate for venoplasty of the proximal fistula he will be scheduled for surgical revision of the proximal fistula potentially using the branch of the antecubital level for an interposition segment.  [name], MD, FACS [facility]  DATE OF DICTATION:   06/02/2012

## 2012-06-02 NOTE — Interval H&P Note (Signed)
History and Physical Interval Note:  06/02/2012 7:20 AM  Troy [name]  has presented today for surgery, with the diagnosis of instage renal  The various methods of treatment have been discussed with the patient and family. After consideration of risks, benefits and other options for treatment, the patient has consented to  Procedure(s) (LRB) with comments: FISTULOGRAM (N/A) possible venoplasty as a surgical intervention .  The patient's history has been reviewed, patient examined, no change in status, stable for surgery.  I have reviewed the patient's chart and labs.  Questions were answered to the patient's satisfaction.     DICKSON,CHRISTOPHER S

## 2012-06-02 NOTE — H&P (View-Only) (Signed)
[facility]in [facility]ist of [facility]  Patient name: Troy [name] MRN: [id number] DOB: [birth year] Sex: male  REASON FOR VISIT: follow up of left radiocephalic AV fistula  HPI: Troy [name] is a 72 y.o. male who had a left radiocephalic fistula placed on 02/28/12. He is not yet on dialysis. When I last saw him on 04/16/2012, diameters of the fistula range from 0.46-0.65. There was an area of increased callosity within the proximal fistula at the site of the valve but no significant stenosis was identified. I brought him back today for a one-month follow up visit and duplex scan.  Duplex scan today shows that the fistula diameters range from 0.45 to 0.58 cm. Thus the fistula has not enlarged significantly. There are elevated velocities in the proximal fistula which actually increased compared this study on his previous visit.   REVIEW OF SYSTEMS: Valu.Nieves ] denotes positive finding; [  ] denotes negative finding  CARDIOVASCULAR:  [ ]  chest pain   [ ]  dyspnea on exertion    CONSTITUTIONAL:  [ ]  fever   [ ]  chills  PHYSICAL EXAM: Filed Vitals:   05/14/12 1214  BP: 164/74  Pulse: 57  Height: 5\' 8"  (1.727 m)  Weight: 202 lb (91.627 kg)  SpO2: 100%   Body mass index is 30.71 kg/(m^2). GENERAL: The patient is a well-nourished male, in no acute distress. The vital signs are documented above. CARDIOVASCULAR: There is a regular rate and rhythm  PULMONARY: There is good air exchange bilaterally without wheezing or rales. The fistula is slightly pulsatile proximally. There is a palpable thrill throughout the forearm.  MEDICAL ISSUES: Given that the vein does not appear to be maturing adequately and he may have a proximal stenosis, I have recommended that we proceed with a fistulogram with limited contrast in order to evaluate for a proximal stenosis which could potentially be addressed with venoplasty. This has been scheduled for 06/02/2012.  [place] [facility]  [place] [facility]: [phone]

## 2012-06-09 ENCOUNTER — Encounter (HOSPITAL_COMMUNITY): Payer: Self-pay | Admitting: *Deleted

## 2012-06-09 MED ORDER — VANCOMYCIN HCL 1000 MG IV SOLR
1500.0000 mg | INTRAVENOUS | Status: AC
  Administered 2012-06-10: 1500 mg via INTRAVENOUS
  Filled 2012-06-09: qty 1500

## 2012-06-10 ENCOUNTER — Ambulatory Visit (HOSPITAL_COMMUNITY): Payer: Medicare Other | Admitting: Anesthesiology

## 2012-06-10 ENCOUNTER — Encounter (HOSPITAL_COMMUNITY)
Admission: RE | Admit: 2012-06-10 | Discharge: 2012-06-10 | Disposition: A | Discharge status: Home / Self Care | Payer: Medicare Other | Source: Ambulatory Visit | Attending: Nephrology | Admitting: Nephrology

## 2012-06-10 ENCOUNTER — Encounter (HOSPITAL_COMMUNITY)
Admission: RE | Disposition: A | Discharge status: Home / Self Care | Payer: Self-pay | Source: Ambulatory Visit | Attending: Vascular Surgery

## 2012-06-10 ENCOUNTER — Encounter (HOSPITAL_COMMUNITY): Payer: Self-pay | Admitting: Anesthesiology

## 2012-06-10 ENCOUNTER — Ambulatory Visit (HOSPITAL_COMMUNITY)
Admission: RE | Admit: 2012-06-10 | Discharge: 2012-06-10 | Disposition: A | Discharge status: Home / Self Care | Payer: Medicare Other | Source: Ambulatory Visit | Attending: Vascular Surgery | Admitting: Vascular Surgery

## 2012-06-10 DIAGNOSIS — T82598A Other mechanical complication of other cardiac and vascular devices and implants, initial encounter: Secondary | ICD-10-CM | POA: Insufficient documentation

## 2012-06-10 DIAGNOSIS — Y832 Surgical operation with anastomosis, bypass or graft as the cause of abnormal reaction of the patient, or of later complication, without mention of misadventure at the time of the procedure: Secondary | ICD-10-CM | POA: Insufficient documentation

## 2012-06-10 DIAGNOSIS — D638 Anemia in other chronic diseases classified elsewhere: Secondary | ICD-10-CM | POA: Insufficient documentation

## 2012-06-10 DIAGNOSIS — N189 Chronic kidney disease, unspecified: Secondary | ICD-10-CM

## 2012-06-10 DIAGNOSIS — Z992 Dependence on renal dialysis: Secondary | ICD-10-CM | POA: Insufficient documentation

## 2012-06-10 DIAGNOSIS — I120 Hypertensive chronic kidney disease with stage 5 chronic kidney disease or end stage renal disease: Secondary | ICD-10-CM | POA: Insufficient documentation

## 2012-06-10 DIAGNOSIS — N184 Chronic kidney disease, stage 4 (severe): Secondary | ICD-10-CM | POA: Insufficient documentation

## 2012-06-10 DIAGNOSIS — T82898A Other specified complication of vascular prosthetic devices, implants and grafts, initial encounter: Secondary | ICD-10-CM

## 2012-06-10 DIAGNOSIS — I69998 Other sequelae following unspecified cerebrovascular disease: Secondary | ICD-10-CM | POA: Insufficient documentation

## 2012-06-10 DIAGNOSIS — N186 End stage renal disease: Secondary | ICD-10-CM | POA: Insufficient documentation

## 2012-06-10 HISTORY — DX: Calculus of kidney: N20.0

## 2012-06-10 HISTORY — PX: REVISON OF ARTERIOVENOUS FISTULA: SHX6074

## 2012-06-10 HISTORY — DX: Personal history of other medical treatment: Z92.89

## 2012-06-10 HISTORY — PX: PATCH ANGIOPLASTY: SHX6230

## 2012-06-10 HISTORY — DX: Concussion with loss of consciousness of unspecified duration, initial encounter: S06.0X9A

## 2012-06-10 HISTORY — DX: Unspecified hemorrhoids: K64.9

## 2012-06-10 HISTORY — DX: Concussion with loss of consciousness status unknown, initial encounter: S06.0XAA

## 2012-06-10 HISTORY — DX: Ventral hernia without obstruction or gangrene: K43.9

## 2012-06-10 LAB — POCT I-STAT, CHEM 8
Calcium, Ion: 1.22 mmol/L (ref 1.13–1.30)
Chloride: 110 mEq/L (ref 96–112)
Creatinine, Ser: 2.9 mg/dL — ABNORMAL HIGH (ref 0.50–1.35)
Glucose, Bld: 110 mg/dL — ABNORMAL HIGH (ref 70–99)
HCT: 32 % — ABNORMAL LOW (ref 39.0–52.0)

## 2012-06-10 SURGERY — REVISON OF ARTERIOVENOUS FISTULA
Anesthesia: General | Site: Arm Lower | Laterality: Left | Wound class: Clean

## 2012-06-10 MED ORDER — MIDAZOLAM HCL 5 MG/5ML IJ SOLN
INTRAMUSCULAR | Status: DC | PRN
  Administered 2012-06-10: 1 mg via INTRAVENOUS

## 2012-06-10 MED ORDER — OXYCODONE HCL 5 MG PO TABS: 5.0000 mg | ORAL_TABLET | ORAL | Status: DC | PRN

## 2012-06-10 MED ORDER — SODIUM CHLORIDE 0.9 % IR SOLN
Status: DC | PRN
  Administered 2012-06-10: 1000 mL

## 2012-06-10 MED ORDER — HEPARIN SODIUM (PORCINE) 1000 UNIT/ML IJ SOLN
INTRAMUSCULAR | Status: DC | PRN
  Administered 2012-06-10: 7 mL via INTRAVENOUS

## 2012-06-10 MED ORDER — PROPOFOL 10 MG/ML IV BOLUS
INTRAVENOUS | Status: DC | PRN
  Administered 2012-06-10: 110 mg via INTRAVENOUS

## 2012-06-10 MED ORDER — LIDOCAINE HCL (PF) 1 % IJ SOLN
INTRAMUSCULAR | Status: DC | PRN
  Administered 2012-06-10: 6 mL

## 2012-06-10 MED ORDER — ONDANSETRON HCL 4 MG/2ML IJ SOLN
INTRAMUSCULAR | Status: DC | PRN
  Administered 2012-06-10: 4 mg via INTRAVENOUS

## 2012-06-10 MED ORDER — ONDANSETRON HCL 4 MG/2ML IJ SOLN: 4.0000 mg | Freq: Once | INTRAMUSCULAR | Status: DC | PRN

## 2012-06-10 MED ORDER — SODIUM CHLORIDE 0.9 % IR SOLN
Status: DC | PRN
  Administered 2012-06-10: 15:00:00

## 2012-06-10 MED ORDER — MUPIROCIN 2 % EX OINT
TOPICAL_OINTMENT | Freq: Two times a day (BID) | CUTANEOUS | Status: DC
  Administered 2012-06-10: 1 via NASAL
  Filled 2012-06-10: qty 22

## 2012-06-10 MED ORDER — LIDOCAINE HCL (PF) 1 % IJ SOLN
INTRAMUSCULAR | Status: AC
  Filled 2012-06-10: qty 30

## 2012-06-10 MED ORDER — LIDOCAINE HCL (CARDIAC) 10 MG/ML IV SOLN
INTRAVENOUS | Status: DC | PRN
  Administered 2012-06-10: 70 mg via INTRAVENOUS

## 2012-06-10 MED ORDER — MUPIROCIN 2 % EX OINT
TOPICAL_OINTMENT | CUTANEOUS | Status: AC
  Administered 2012-06-10: 1 via NASAL
  Filled 2012-06-10: qty 22

## 2012-06-10 MED ORDER — EPOETIN ALFA 20000 UNIT/ML IJ SOLN
20000.0000 [IU] | INTRAMUSCULAR | Status: DC
  Administered 2012-06-10: 20000 [IU] via SUBCUTANEOUS

## 2012-06-10 MED ORDER — DEXTROSE 5 % IV SOLN
10.0000 mg | INTRAVENOUS | Status: DC | PRN
  Administered 2012-06-10: 20 ug/min via INTRAVENOUS

## 2012-06-10 MED ORDER — PROTAMINE SULFATE 10 MG/ML IV SOLN
INTRAVENOUS | Status: DC | PRN
  Administered 2012-06-10: 30 mg via INTRAVENOUS
  Administered 2012-06-10: 10 mg via INTRAVENOUS

## 2012-06-10 MED ORDER — EPOETIN ALFA 20000 UNIT/ML IJ SOLN
INTRAMUSCULAR | Status: AC
  Filled 2012-06-10: qty 1

## 2012-06-10 MED ORDER — HYDROMORPHONE HCL PF 1 MG/ML IJ SOLN: 0.2500 mg | INTRAMUSCULAR | Status: DC | PRN

## 2012-06-10 MED ORDER — ACETAMINOPHEN 10 MG/ML IV SOLN: 1000.0000 mg | Freq: Once | INTRAVENOUS | Status: DC | PRN

## 2012-06-10 MED ORDER — SODIUM CHLORIDE 0.9 % IV SOLN
INTRAVENOUS | Status: DC
  Administered 2012-06-10 (×3): via INTRAVENOUS

## 2012-06-10 MED ORDER — FENTANYL CITRATE 0.05 MG/ML IJ SOLN
INTRAMUSCULAR | Status: DC | PRN
  Administered 2012-06-10: 100 ug via INTRAVENOUS
  Administered 2012-06-10: 50 ug via INTRAVENOUS

## 2012-06-10 SURGICAL SUPPLY — 42 items
ADH SKN CLS APL DERMABOND .7 (GAUZE/BANDAGES/DRESSINGS) ×2
CANISTER SUCTION 2500CC (MISCELLANEOUS) ×2 IMPLANT
CLIP TI MEDIUM 6 (CLIP) ×2 IMPLANT
CLIP TI WIDE RED SMALL 6 (CLIP) ×3 IMPLANT
CLOTH BEACON ORANGE TIMEOUT ST (SAFETY) ×2 IMPLANT
COVER PROBE W GEL 5X96 (DRAPES) ×2 IMPLANT
COVER SURGICAL LIGHT HANDLE (MISCELLANEOUS) ×2 IMPLANT
DECANTER SPIKE VIAL GLASS SM (MISCELLANEOUS) ×2 IMPLANT
DERMABOND ADVANCED (GAUZE/BANDAGES/DRESSINGS) ×2
DERMABOND ADVANCED .7 DNX12 (GAUZE/BANDAGES/DRESSINGS) ×1 IMPLANT
DRAIN PENROSE 1/2X12 LTX STRL (WOUND CARE) IMPLANT
ELECT REM PT RETURN 9FT ADLT (ELECTROSURGICAL) ×2
ELECTRODE REM PT RTRN 9FT ADLT (ELECTROSURGICAL) ×1 IMPLANT
GLOVE BIO SURGEON STRL SZ7.5 (GLOVE) ×2 IMPLANT
GLOVE BIOGEL PI IND STRL 6.5 (GLOVE) IMPLANT
GLOVE BIOGEL PI IND STRL 7.0 (GLOVE) IMPLANT
GLOVE BIOGEL PI IND STRL 7.5 (GLOVE) IMPLANT
GLOVE BIOGEL PI IND STRL 8 (GLOVE) ×1 IMPLANT
GLOVE BIOGEL PI INDICATOR 6.5 (GLOVE) ×2
GLOVE BIOGEL PI INDICATOR 7.0 (GLOVE) ×1
GLOVE BIOGEL PI INDICATOR 7.5 (GLOVE) ×2
GLOVE BIOGEL PI INDICATOR 8 (GLOVE) ×1
GLOVE ECLIPSE 6.5 STRL STRAW (GLOVE) ×1 IMPLANT
GLOVE SS BIOGEL STRL SZ 7 (GLOVE) IMPLANT
GLOVE SUPERSENSE BIOGEL SZ 7 (GLOVE) ×1
GLOVE SURG SS PI 7.5 STRL IVOR (GLOVE) ×1 IMPLANT
GOWN STRL NON-REIN LRG LVL3 (GOWN DISPOSABLE) ×6 IMPLANT
KIT BASIN OR (CUSTOM PROCEDURE TRAY) ×2 IMPLANT
KIT ROOM TURNOVER OR (KITS) ×2 IMPLANT
NS IRRIG 1000ML POUR BTL (IV SOLUTION) ×2 IMPLANT
PACK CV ACCESS (CUSTOM PROCEDURE TRAY) ×2 IMPLANT
PAD ARMBOARD 7.5X6 YLW CONV (MISCELLANEOUS) ×4 IMPLANT
SPONGE GAUZE 4X4 12PLY (GAUZE/BANDAGES/DRESSINGS) ×2 IMPLANT
SPONGE SURGIFOAM ABS GEL 100 (HEMOSTASIS) IMPLANT
SUT PROLENE 6 0 BV (SUTURE) ×4 IMPLANT
SUT VIC AB 3-0 SH 27 (SUTURE) ×2
SUT VIC AB 3-0 SH 27X BRD (SUTURE) ×1 IMPLANT
SUT VICRYL 4-0 PS2 18IN ABS (SUTURE) ×3 IMPLANT
TOWEL OR 17X24 6PK STRL BLUE (TOWEL DISPOSABLE) ×2 IMPLANT
TOWEL OR 17X26 10 PK STRL BLUE (TOWEL DISPOSABLE) ×2 IMPLANT
UNDERPAD 30X30 INCONTINENT (UNDERPADS AND DIAPERS) ×2 IMPLANT
WATER STERILE IRR 1000ML POUR (IV SOLUTION) ×2 IMPLANT

## 2012-06-10 NOTE — Anesthesia Preprocedure Evaluation (Addendum)
Anesthesia Evaluation  Patient identified by MRN, date of birth, ID band Patient awake    Reviewed: Allergy & Precautions, H&P , NPO status , Patient's Chart, lab work & pertinent test results  Airway Mallampati: II TM Distance: >3 FB Neck ROM: Full    Dental  (+) Edentulous Upper and Edentulous Lower   Pulmonary former smoker,  breath sounds clear to auscultation        Cardiovascular hypertension, Pt. 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 symptoms from stroke CVA, Residual Symptoms    GI/Hepatic   Endo/Other    Renal/GU ESRFRenal disease     Musculoskeletal   Abdominal   Peds  Hematology   Anesthesia Other Findings   Reproductive/Obstetrics                         Anesthesia Physical Anesthesia Plan  ASA: III  Anesthesia Plan: General   Post-op Pain Management:    Induction:   Airway Management Planned: LMA  Additional Equipment:   Intra-op Plan:   Post-operative Plan:   Informed Consent: I have reviewed the patients History and Physical, chart, labs and discussed the procedure including the risks, benefits and alternatives for the proposed anesthesia with the patient or authorized representative who has indicated his/her understanding and acceptance.     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

## 2012-06-10 NOTE — Op Note (Signed)
NAME: [name]   MRN: [id number] DOB: 1941-01-15    DATE OF OPERATION: 06/10/2012  PREOP DIAGNOSIS: poorly maturing left radiocephalic AV fistula  POSTOP DIAGNOSIS: same  PROCEDURE: revision of left radial cephalic AV fistula. Vein patch angioplasty of proximal stenosis. Ligation of competing branch.  SURGEON: Judeth Cornfield. Scot Dock, MD, FACS  ASSIST: Gerri Lins PA  ANESTHESIA: Gen.   EBL: minimal  INDICATIONS: Troy [name] is a 71 y.o. male had a left radiocephalic fistula which has been slow to mature. Duplex scan shows a stenosis in the proximal fistula and he is brought in for revision.  FINDINGS: there was intimal hyperplasia at a valve site which was patched with a vein patch.  TECHNIQUE: Patient was brought to the operating room and received a general anesthetic. The left upper extremity was prepped and draped in usual sterile fashion. A longitudinal incision was made over the proximal fistula which was dissected free. The radial artery proximal and distal to the anastomosis was also dissected free. There was a large branch further up the forearm which was identified with ultrasound. Transverse incision was made at this level and the branch was dissected free. It was clipped distally and then ligated proximally. This was taken to be used as a vein patch. It was opened longitudinally. The patient was heparinized. The radial artery was clamped proximally and distally to the anastomosis. A longitudinal venotomy was made over the area of stenosis. There was marked intimal hyperplasia at this level. The vein patch was sewn using 2 continuous 6-0 Prolene sutures. I was able to pass a 5 dilator distally beyond the anastomosis. The radial artery was able to take a 3 mm dilator. At the completion was an excellent thrill in the fistula. Hemostasis was obtained in the wounds and the heparin was partially reversed with protamine. The wounds were closed with a deep layer of 3-0 Vicryl and the  skin closed with 4-0 Vicryl. Dermabond was applied. The patient tolerated the procedure well and was transferred to the recovery room in stable condition. All needle and sponge counts were correct.  [name], MD, FACS [facility] of [facility]  DATE OF DICTATION:   06/10/2012

## 2012-06-10 NOTE — Interval H&P Note (Signed)
History and Physical Interval Note:  06/10/2012 2:05 PM  Troy [name]  has presented today for surgery, with the diagnosis of ESRD  The various methods of treatment have been discussed with the patient and family. After consideration of risks, benefits and other options for treatment, the patient has consented to  Procedure(s) (LRB) with comments: REVISON OF ARTERIOVENOUS FISTULA (Left) as a surgical intervention .  The patient's history has been reviewed, patient examined, no change in status, stable for surgery.  I have reviewed the patient's chart and labs.  Questions were answered to the patient's satisfaction.     DICKSON,CHRISTOPHER S

## 2012-06-10 NOTE — H&P (View-Only) (Signed)
Vascular and Vein Specialist of [facility]  Patient name: Troy [name] MRN: [id number] DOB: March 02, 1941 Sex: male  REASON FOR VISIT: follow up of left radiocephalic AV fistula  HPI: Troy [name] is a 71 y.o. male who had a left radiocephalic fistula placed on 02/28/12. He is not yet on dialysis. When I last saw him on 04/16/2012, diameters of the fistula range from 0.46-0.65. There was an area of increased callosity within the proximal fistula at the site of the valve but no significant stenosis was identified. I brought him back today for a one-month follow up visit and duplex scan.  Duplex scan today shows that the fistula diameters range from 0.45 to 0.58 cm. Thus the fistula has not enlarged significantly. There are elevated velocities in the proximal fistula which actually increased compared this study on his previous visit.   REVIEW OF SYSTEMS: Valu.Nieves ] denotes positive finding; [  ] denotes negative finding  CARDIOVASCULAR:  [ ]  chest pain   [ ]  dyspnea on exertion    CONSTITUTIONAL:  [ ]  fever   [ ]  chills  PHYSICAL EXAM: Filed Vitals:   05/14/12 1214  BP: 164/74  Pulse: 57  Height: 5\' 8"  (1.727 m)  Weight: 202 lb (91.627 kg)  SpO2: 100%   Body mass index is 30.71 kg/(m^2). GENERAL: The patient is a well-nourished male, in no acute distress. The vital signs are documented above. CARDIOVASCULAR: There is a regular rate and rhythm  PULMONARY: There is good air exchange bilaterally without wheezing or rales. The fistula is slightly pulsatile proximally. There is a palpable thrill throughout the forearm.  MEDICAL ISSUES: Given that the vein does not appear to be maturing adequately and he may have a proximal stenosis, I have recommended that we proceed with a fistulogram with limited contrast in order to evaluate for a proximal stenosis which could potentially be addressed with venoplasty. This has been scheduled for 06/02/2012.  [place] [facility]  [place] [facility]: [phone]

## 2012-06-10 NOTE — Transfer of Care (Signed)
Immediate Anesthesia Transfer of Care Note  Patient: Troy [name]  Procedure(s) Performed: Procedure(s) (LRB) with comments: REVISON OF ARTERIOVENOUS FISTULA (Left) PATCH ANGIOPLASTY (Left) - Vein patch angioplasty  Patient Location: PACU  Anesthesia Type:General  Level of Consciousness: awake, alert , oriented and patient cooperative  Airway & Oxygen Therapy: Patient Spontanous Breathing  Post-op Assessment: Report given to PACU RN, Post -op Vital signs reviewed and stable and Patient moving all extremities  Post vital signs: Reviewed and stable  Complications: No apparent anesthesia complications

## 2012-06-11 ENCOUNTER — Telehealth: Payer: Self-pay | Admitting: Vascular Surgery

## 2012-06-11 ENCOUNTER — Encounter (HOSPITAL_COMMUNITY): Payer: Self-pay | Admitting: Vascular Surgery

## 2012-06-11 NOTE — Telephone Encounter (Signed)
Patient notified by phone, he voiced understanding, dpm

## 2012-06-11 NOTE — Telephone Encounter (Signed)
Message copied by Gena Fray on Wed Jun 11, 2012 12:01 PM ------      Message from: Alfonso Patten      Created: Wed Jun 11, 2012  9:45 AM      Regarding: FW: charge and f/u                   ----- Message -----         From: Angelia Mould, MD         Sent: 06/10/2012   4:33 PM           To: Patrici Ranks, Alfonso Patten, RN      Subject: charge and f/u                                           PROCEDURE: revision of left radial cephalic AV fistula. Vein patch angioplasty of proximal stenosis. Ligation of competing branch.                  SURGEON: Judeth Cornfield. Scot Dock, MD, FACS                  ASSIST: Gerri Lins PA            He needs a follow up visit in 3-4 weeks. Thank you. CSD

## 2012-06-15 NOTE — Anesthesia Postprocedure Evaluation (Signed)
Anesthesia Post Note  Patient: Troy [name]  Procedure(s) Performed: Procedure(s) (LRB): REVISON OF ARTERIOVENOUS FISTULA (Left) PATCH ANGIOPLASTY (Left)  Anesthesia type: GA  Patient location: PACU  Post pain: Pain level controlled  Post assessment: Post-op Vital signs reviewed  Last Vitals:  Filed Vitals:   06/10/12 1707  BP: 140/59  Pulse: 62  Temp: 36.2 C  Resp: 16    Post vital signs: Reviewed  Level of consciousness: sedated  Complications: No apparent anesthesia complications

## 2012-06-30 ENCOUNTER — Encounter (HOSPITAL_COMMUNITY)
Admission: RE | Admit: 2012-06-30 | Discharge: 2012-06-30 | Disposition: A | Discharge status: Home / Self Care | Payer: Medicare Other | Source: Ambulatory Visit | Attending: Nephrology | Admitting: Nephrology

## 2012-06-30 LAB — POCT HEMOGLOBIN-HEMACUE: Hemoglobin: 12.5 g/dL — ABNORMAL LOW (ref 13.0–17.0)

## 2012-06-30 MED ORDER — EPOETIN ALFA 20000 UNIT/ML IJ SOLN: 20000.0000 [IU] | INTRAMUSCULAR | Status: DC

## 2012-07-14 ENCOUNTER — Encounter (HOSPITAL_COMMUNITY)
Admission: RE | Admit: 2012-07-14 | Discharge: 2012-07-14 | Disposition: A | Discharge status: Home / Self Care | Payer: Medicare Other | Source: Ambulatory Visit | Attending: Nephrology | Admitting: Nephrology

## 2012-07-14 DIAGNOSIS — N184 Chronic kidney disease, stage 4 (severe): Secondary | ICD-10-CM | POA: Insufficient documentation

## 2012-07-14 DIAGNOSIS — D638 Anemia in other chronic diseases classified elsewhere: Secondary | ICD-10-CM | POA: Insufficient documentation

## 2012-07-14 LAB — IRON AND TIBC
Iron: 113 ug/dL (ref 42–135)
Saturation Ratios: 31 % (ref 20–55)
UIBC: 251 ug/dL (ref 125–400)

## 2012-07-14 MED ORDER — CLONIDINE HCL 0.1 MG PO TABS
ORAL_TABLET | ORAL | Status: AC
  Administered 2012-07-14: 0.1 mg
  Filled 2012-07-14: qty 1

## 2012-07-14 MED ORDER — EPOETIN ALFA 20000 UNIT/ML IJ SOLN: 20000.0000 [IU] | INTRAMUSCULAR | Status: DC

## 2012-07-15 ENCOUNTER — Encounter: Payer: Self-pay | Admitting: Vascular Surgery

## 2012-07-16 ENCOUNTER — Encounter: Payer: Self-pay | Admitting: Vascular Surgery

## 2012-07-16 ENCOUNTER — Ambulatory Visit (INDEPENDENT_AMBULATORY_CARE_PROVIDER_SITE_OTHER): Payer: Medicare Other | Admitting: Vascular Surgery

## 2012-07-16 VITALS — BP 146/63 | HR 56 | Ht 68.0 in | Wt 203.7 lb

## 2012-07-16 DIAGNOSIS — N186 End stage renal disease: Secondary | ICD-10-CM

## 2012-07-16 NOTE — Progress Notes (Signed)
Vascular and Vein Specialist of [facility]  Patient name: Troy [name] MRN: [id number] DOB: 1940-12-30 Sex: male  REASON FOR VISIT: follow up after revision of left radiocephalic AV fistula.  HPI: Troy [name] is a 72 y.o. male who had a left radiocephalic AV fistula placed on 02/28/12. He developed some stenosis in the proximal fistula and underwent revision on 06/10/2012. He had vein patch angioplasty of the proximal stenosis and ligation of a competing branches. He comes in for a routine postoperative visit.   REVIEW OF SYSTEMS: Valu.Nieves ] denotes positive finding; [  ] denotes negative finding  CARDIOVASCULAR:  [ ]  chest pain   [ ]  dyspnea on exertion    CONSTITUTIONAL:  [ ]  fever   [ ]  chills  PHYSICAL EXAM: Filed Vitals:   07/16/12 1138  BP: 146/63  Pulse: 56  Height: 5\' 8"  (1.727 m)  Weight: 203 lb 11.2 oz (92.398 kg)  SpO2: 99%   Body mass index is 30.97 kg/(m^2). GENERAL: The patient is a well-nourished male, in no acute distress. The vital signs are documented above. CARDIOVASCULAR: There is a regular rate and rhythm  PULMONARY: There is good air exchange bilaterally without wheezing or rales. The fistula has an excellent thrill and appears to be maturing nicely. It is easily palpable on exam.  MEDICAL ISSUES: The fistula was maturing nicely medication provide adequate access if he does require hemodialysis. We will see him back as needed.  [place] [facility] of [place] [facility]: [phone]

## 2012-07-28 ENCOUNTER — Encounter (HOSPITAL_COMMUNITY): Payer: Medicare Other

## 2012-08-08 ENCOUNTER — Other Ambulatory Visit (HOSPITAL_COMMUNITY): Payer: Self-pay | Admitting: *Deleted

## 2012-08-11 ENCOUNTER — Encounter (HOSPITAL_COMMUNITY)
Admission: RE | Admit: 2012-08-11 | Discharge: 2012-08-11 | Disposition: A | Discharge status: Home / Self Care | Payer: Medicare Other | Source: Ambulatory Visit | Attending: Nephrology | Admitting: Nephrology

## 2012-08-11 DIAGNOSIS — D638 Anemia in other chronic diseases classified elsewhere: Secondary | ICD-10-CM | POA: Insufficient documentation

## 2012-08-11 DIAGNOSIS — N184 Chronic kidney disease, stage 4 (severe): Secondary | ICD-10-CM | POA: Insufficient documentation

## 2012-08-11 MED ORDER — EPOETIN ALFA 20000 UNIT/ML IJ SOLN
INTRAMUSCULAR | Status: AC
  Filled 2012-08-11: qty 1

## 2012-08-11 MED ORDER — EPOETIN ALFA 20000 UNIT/ML IJ SOLN
20000.0000 [IU] | INTRAMUSCULAR | Status: DC
  Administered 2012-08-11: 20000 [IU] via SUBCUTANEOUS

## 2012-08-12 LAB — POCT HEMOGLOBIN-HEMACUE: Hemoglobin: 11.1 g/dL — ABNORMAL LOW (ref 13.0–17.0)

## 2012-08-26 ENCOUNTER — Other Ambulatory Visit: Payer: Self-pay | Admitting: *Deleted

## 2012-08-26 DIAGNOSIS — I6529 Occlusion and stenosis of unspecified carotid artery: Secondary | ICD-10-CM

## 2012-09-08 ENCOUNTER — Encounter (HOSPITAL_COMMUNITY): Payer: Medicare Other

## 2012-09-09 ENCOUNTER — Encounter: Payer: Self-pay | Admitting: Neurosurgery

## 2012-09-09 ENCOUNTER — Encounter (HOSPITAL_COMMUNITY)
Admission: RE | Admit: 2012-09-09 | Discharge: 2012-09-09 | Disposition: A | Discharge status: Home / Self Care | Payer: Medicare Other | Source: Ambulatory Visit | Attending: Nephrology | Admitting: Nephrology

## 2012-09-09 DIAGNOSIS — D638 Anemia in other chronic diseases classified elsewhere: Secondary | ICD-10-CM | POA: Insufficient documentation

## 2012-09-09 DIAGNOSIS — N184 Chronic kidney disease, stage 4 (severe): Secondary | ICD-10-CM | POA: Insufficient documentation

## 2012-09-09 MED ORDER — EPOETIN ALFA 20000 UNIT/ML IJ SOLN
INTRAMUSCULAR | Status: AC
  Filled 2012-09-09: qty 1

## 2012-09-09 MED ORDER — EPOETIN ALFA 20000 UNIT/ML IJ SOLN
20000.0000 [IU] | INTRAMUSCULAR | Status: DC
  Administered 2012-09-09: 20000 [IU] via SUBCUTANEOUS

## 2012-09-10 ENCOUNTER — Other Ambulatory Visit (INDEPENDENT_AMBULATORY_CARE_PROVIDER_SITE_OTHER): Payer: Medicare Other | Admitting: *Deleted

## 2012-09-10 ENCOUNTER — Ambulatory Visit (INDEPENDENT_AMBULATORY_CARE_PROVIDER_SITE_OTHER): Payer: Medicare Other | Admitting: Neurosurgery

## 2012-09-10 ENCOUNTER — Encounter: Payer: Self-pay | Admitting: Neurosurgery

## 2012-09-10 DIAGNOSIS — Z48812 Encounter for surgical aftercare following surgery on the circulatory system: Secondary | ICD-10-CM

## 2012-09-10 DIAGNOSIS — I6529 Occlusion and stenosis of unspecified carotid artery: Secondary | ICD-10-CM
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
## 2012-09-10 NOTE — Addendum Note (Signed)
Addended by: Mena Goes on: 09/10/2012 02:45 PM   Modules accepted: Orders

## 2012-09-17 ENCOUNTER — Other Ambulatory Visit: Payer: Medicare Other

## 2012-09-17 ENCOUNTER — Ambulatory Visit: Payer: Medicare Other | Admitting: Vascular Surgery

## 2012-10-07 ENCOUNTER — Encounter (HOSPITAL_COMMUNITY)
Admission: RE | Admit: 2012-10-07 | Discharge: 2012-10-07 | Disposition: A | Discharge status: Home / Self Care | Payer: Medicare Other | Source: Ambulatory Visit | Attending: Nephrology | Admitting: Nephrology

## 2012-10-07 DIAGNOSIS — D638 Anemia in other chronic diseases classified elsewhere: Secondary | ICD-10-CM | POA: Insufficient documentation

## 2012-10-07 DIAGNOSIS — N184 Chronic kidney disease, stage 4 (severe): Secondary | ICD-10-CM | POA: Insufficient documentation

## 2012-10-07 LAB — POCT HEMOGLOBIN-HEMACUE: Hemoglobin: 11.6 g/dL — ABNORMAL LOW (ref 13.0–17.0)

## 2012-10-07 MED ORDER — EPOETIN ALFA 20000 UNIT/ML IJ SOLN
INTRAMUSCULAR | Status: AC
  Administered 2012-10-07: 20000 [IU] via SUBCUTANEOUS
  Filled 2012-10-07: qty 1

## 2012-10-07 MED ORDER — EPOETIN ALFA 20000 UNIT/ML IJ SOLN
20000.0000 [IU] | INTRAMUSCULAR | Status: DC
  Administered 2012-10-07: 20000 [IU] via SUBCUTANEOUS

## 2012-11-03 ENCOUNTER — Other Ambulatory Visit (HOSPITAL_COMMUNITY): Payer: Self-pay | Admitting: *Deleted

## 2012-11-04 ENCOUNTER — Encounter (HOSPITAL_COMMUNITY)
Admission: RE | Admit: 2012-11-04 | Discharge: 2012-11-04 | Disposition: A | Discharge status: Home / Self Care | Payer: Medicare Other | Source: Ambulatory Visit | Attending: Nephrology | Admitting: Nephrology

## 2012-11-04 LAB — POCT HEMOGLOBIN-HEMACUE: Hemoglobin: 11.7 g/dL — ABNORMAL LOW (ref 13.0–17.0)

## 2012-11-04 MED ORDER — SODIUM CHLORIDE 0.9 % IV SOLN
1020.0000 mg | Freq: Once | INTRAVENOUS | Status: AC
  Administered 2012-11-04: 1020 mg via INTRAVENOUS
  Filled 2012-11-04: qty 34

## 2012-11-04 MED ORDER — EPOETIN ALFA 20000 UNIT/ML IJ SOLN
INTRAMUSCULAR | Status: AC
  Filled 2012-11-04: qty 1

## 2012-11-04 MED ORDER — EPOETIN ALFA 20000 UNIT/ML IJ SOLN
20000.0000 [IU] | INTRAMUSCULAR | Status: DC
  Administered 2012-11-04: 20000 [IU] via SUBCUTANEOUS

## 2012-11-28 ENCOUNTER — Other Ambulatory Visit (HOSPITAL_COMMUNITY): Payer: Self-pay | Admitting: *Deleted

## 2012-12-02 ENCOUNTER — Encounter (HOSPITAL_COMMUNITY)
Admission: RE | Admit: 2012-12-02 | Discharge: 2012-12-02 | Disposition: A | Discharge status: Home / Self Care | Payer: Medicare Other | Source: Ambulatory Visit | Attending: Nephrology | Admitting: Nephrology

## 2012-12-02 DIAGNOSIS — D638 Anemia in other chronic diseases classified elsewhere: Secondary | ICD-10-CM | POA: Insufficient documentation

## 2012-12-02 DIAGNOSIS — N184 Chronic kidney disease, stage 4 (severe): Secondary | ICD-10-CM | POA: Insufficient documentation

## 2012-12-02 LAB — POCT HEMOGLOBIN-HEMACUE: Hemoglobin: 12.7 g/dL — ABNORMAL LOW (ref 13.0–17.0)

## 2012-12-02 MED ORDER — EPOETIN ALFA 20000 UNIT/ML IJ SOLN: 20000.0000 [IU] | INTRAMUSCULAR | Status: DC

## 2012-12-16 ENCOUNTER — Encounter (HOSPITAL_COMMUNITY)
Admission: RE | Admit: 2012-12-16 | Discharge: 2012-12-16 | Disposition: A | Discharge status: Home / Self Care | Payer: Medicare Other | Source: Ambulatory Visit | Attending: Nephrology | Admitting: Nephrology

## 2012-12-16 DIAGNOSIS — D638 Anemia in other chronic diseases classified elsewhere: Secondary | ICD-10-CM | POA: Insufficient documentation

## 2012-12-16 DIAGNOSIS — N184 Chronic kidney disease, stage 4 (severe): Secondary | ICD-10-CM | POA: Insufficient documentation

## 2012-12-16 LAB — POCT HEMOGLOBIN-HEMACUE: Hemoglobin: 12.5 g/dL — ABNORMAL LOW (ref 13.0–17.0)

## 2012-12-16 MED ORDER — EPOETIN ALFA 20000 UNIT/ML IJ SOLN: 20000.0000 [IU] | INTRAMUSCULAR | Status: DC

## 2012-12-30 ENCOUNTER — Encounter (HOSPITAL_COMMUNITY)
Admission: RE | Admit: 2012-12-30 | Discharge: 2012-12-30 | Disposition: A | Discharge status: Home / Self Care | Payer: Medicare Other | Source: Ambulatory Visit | Attending: Nephrology | Admitting: Nephrology

## 2012-12-30 MED ORDER — EPOETIN ALFA 20000 UNIT/ML IJ SOLN
INTRAMUSCULAR | Status: AC
  Filled 2012-12-30: qty 1

## 2012-12-30 MED ORDER — EPOETIN ALFA 20000 UNIT/ML IJ SOLN
20000.0000 [IU] | INTRAMUSCULAR | Status: DC
  Administered 2012-12-30: 20000 [IU] via SUBCUTANEOUS

## 2013-01-26 ENCOUNTER — Other Ambulatory Visit (HOSPITAL_COMMUNITY): Payer: Self-pay | Admitting: *Deleted

## 2013-01-27 ENCOUNTER — Encounter (HOSPITAL_COMMUNITY): Payer: Medicare Other

## 2013-02-03 ENCOUNTER — Encounter (HOSPITAL_COMMUNITY)
Admission: RE | Admit: 2013-02-03 | Discharge: 2013-02-03 | Disposition: A | Discharge status: Home / Self Care | Payer: Medicare Other | Source: Ambulatory Visit | Attending: Nephrology | Admitting: Nephrology

## 2013-02-03 DIAGNOSIS — D638 Anemia in other chronic diseases classified elsewhere: Secondary | ICD-10-CM | POA: Insufficient documentation

## 2013-02-03 DIAGNOSIS — N184 Chronic kidney disease, stage 4 (severe): Secondary | ICD-10-CM | POA: Insufficient documentation

## 2013-02-03 LAB — FERRITIN: Ferritin: 1041 ng/mL — ABNORMAL HIGH (ref 22–322)

## 2013-02-03 LAB — IRON AND TIBC: TIBC: 373 ug/dL (ref 215–435)

## 2013-02-03 MED ORDER — EPOETIN ALFA 20000 UNIT/ML IJ SOLN: 20000.0000 [IU] | INTRAMUSCULAR | Status: DC

## 2013-02-03 MED ORDER — EPOETIN ALFA 20000 UNIT/ML IJ SOLN
INTRAMUSCULAR | Status: AC
  Administered 2013-02-03: 20000 [IU] via SUBCUTANEOUS
  Filled 2013-02-03: qty 1

## 2013-03-03 ENCOUNTER — Encounter (HOSPITAL_COMMUNITY)
Admission: RE | Admit: 2013-03-03 | Discharge: 2013-03-03 | Disposition: A | Discharge status: Home / Self Care | Payer: Medicare Other | Source: Ambulatory Visit | Attending: Nephrology | Admitting: Nephrology

## 2013-03-03 DIAGNOSIS — D638 Anemia in other chronic diseases classified elsewhere: Secondary | ICD-10-CM | POA: Insufficient documentation

## 2013-03-03 DIAGNOSIS — N184 Chronic kidney disease, stage 4 (severe): Secondary | ICD-10-CM | POA: Insufficient documentation

## 2013-03-03 MED ORDER — EPOETIN ALFA 20000 UNIT/ML IJ SOLN
INTRAMUSCULAR | Status: AC
  Administered 2013-03-03: 20000 [IU] via SUBCUTANEOUS
  Filled 2013-03-03: qty 1

## 2013-03-03 MED ORDER — EPOETIN ALFA 20000 UNIT/ML IJ SOLN: 20000.0000 [IU] | INTRAMUSCULAR | Status: DC

## 2013-03-31 ENCOUNTER — Encounter (HOSPITAL_COMMUNITY)
Admission: RE | Admit: 2013-03-31 | Discharge: 2013-03-31 | Disposition: A | Discharge status: Home / Self Care | Payer: Medicare Other | Source: Ambulatory Visit | Attending: Nephrology | Admitting: Nephrology

## 2013-03-31 DIAGNOSIS — D638 Anemia in other chronic diseases classified elsewhere: Secondary | ICD-10-CM | POA: Insufficient documentation

## 2013-03-31 DIAGNOSIS — N184 Chronic kidney disease, stage 4 (severe): Secondary | ICD-10-CM | POA: Insufficient documentation

## 2013-03-31 LAB — POCT HEMOGLOBIN-HEMACUE: Hemoglobin: 11.6 g/dL — ABNORMAL LOW (ref 13.0–17.0)

## 2013-03-31 MED ORDER — EPOETIN ALFA 20000 UNIT/ML IJ SOLN
20000.0000 [IU] | INTRAMUSCULAR | Status: DC
  Administered 2013-03-31: 20000 [IU] via SUBCUTANEOUS

## 2013-03-31 MED ORDER — EPOETIN ALFA 20000 UNIT/ML IJ SOLN
INTRAMUSCULAR | Status: AC
  Filled 2013-03-31: qty 1

## 2013-04-27 ENCOUNTER — Other Ambulatory Visit (HOSPITAL_COMMUNITY): Payer: Self-pay | Admitting: *Deleted

## 2013-04-28 ENCOUNTER — Encounter (HOSPITAL_COMMUNITY)
Admission: RE | Admit: 2013-04-28 | Discharge: 2013-04-28 | Disposition: A | Discharge status: Home / Self Care | Payer: Medicare Other | Source: Ambulatory Visit | Attending: Nephrology | Admitting: Nephrology

## 2013-04-28 DIAGNOSIS — N184 Chronic kidney disease, stage 4 (severe): Secondary | ICD-10-CM | POA: Insufficient documentation

## 2013-04-28 DIAGNOSIS — D638 Anemia in other chronic diseases classified elsewhere: Secondary | ICD-10-CM | POA: Insufficient documentation

## 2013-04-28 LAB — IRON AND TIBC
Iron: 132 ug/dL (ref 42–135)
Saturation Ratios: 37 % (ref 20–55)
TIBC: 359 ug/dL (ref 215–435)
UIBC: 227 ug/dL (ref 125–400)

## 2013-04-28 LAB — FERRITIN: Ferritin: 1065 ng/mL — ABNORMAL HIGH (ref 22–322)

## 2013-04-28 MED ORDER — EPOETIN ALFA 20000 UNIT/ML IJ SOLN: 20000.0000 [IU] | INTRAMUSCULAR | Status: DC

## 2013-05-12 ENCOUNTER — Encounter (HOSPITAL_COMMUNITY)
Admission: RE | Admit: 2013-05-12 | Discharge: 2013-05-12 | Disposition: A | Discharge status: Home / Self Care | Payer: Medicare Other | Source: Ambulatory Visit | Attending: Nephrology | Admitting: Nephrology

## 2013-05-12 DIAGNOSIS — D638 Anemia in other chronic diseases classified elsewhere: Secondary | ICD-10-CM | POA: Insufficient documentation

## 2013-05-12 DIAGNOSIS — N184 Chronic kidney disease, stage 4 (severe): Secondary | ICD-10-CM | POA: Insufficient documentation

## 2013-05-12 MED ORDER — EPOETIN ALFA 20000 UNIT/ML IJ SOLN
20000.0000 [IU] | INTRAMUSCULAR | Status: DC
  Administered 2013-05-12: 20000 [IU] via SUBCUTANEOUS

## 2013-05-12 MED ORDER — EPOETIN ALFA 20000 UNIT/ML IJ SOLN
INTRAMUSCULAR | Status: AC
  Filled 2013-05-12: qty 1

## 2013-06-09 ENCOUNTER — Encounter (HOSPITAL_COMMUNITY)
Admission: RE | Admit: 2013-06-09 | Discharge: 2013-06-09 | Disposition: A | Discharge status: Home / Self Care | Payer: Medicare Other | Source: Ambulatory Visit | Attending: Nephrology | Admitting: Nephrology

## 2013-06-09 DIAGNOSIS — D638 Anemia in other chronic diseases classified elsewhere: Secondary | ICD-10-CM | POA: Insufficient documentation

## 2013-06-09 DIAGNOSIS — N184 Chronic kidney disease, stage 4 (severe): Secondary | ICD-10-CM | POA: Insufficient documentation

## 2013-06-09 LAB — POCT HEMOGLOBIN-HEMACUE: Hemoglobin: 11.4 g/dL — ABNORMAL LOW (ref 13.0–17.0)

## 2013-06-09 MED ORDER — EPOETIN ALFA 20000 UNIT/ML IJ SOLN
INTRAMUSCULAR | Status: AC
  Filled 2013-06-09: qty 1

## 2013-06-09 MED ORDER — EPOETIN ALFA 20000 UNIT/ML IJ SOLN
20000.0000 [IU] | INTRAMUSCULAR | Status: DC
  Administered 2013-06-09: 10:00:00 20000 [IU] via SUBCUTANEOUS

## 2013-07-07 ENCOUNTER — Encounter (HOSPITAL_COMMUNITY)
Admission: RE | Admit: 2013-07-07 | Discharge: 2013-07-07 | Disposition: A | Discharge status: Home / Self Care | Payer: Medicare Other | Source: Ambulatory Visit | Attending: Nephrology | Admitting: Nephrology

## 2013-07-07 LAB — POCT HEMOGLOBIN-HEMACUE: Hemoglobin: 11.6 g/dL — ABNORMAL LOW (ref 13.0–17.0)

## 2013-07-07 MED ORDER — EPOETIN ALFA 20000 UNIT/ML IJ SOLN
INTRAMUSCULAR | Status: AC
  Filled 2013-07-07: qty 1

## 2013-07-07 MED ORDER — EPOETIN ALFA 20000 UNIT/ML IJ SOLN
20000.0000 [IU] | INTRAMUSCULAR | Status: DC
  Administered 2013-07-07: 20000 [IU] via SUBCUTANEOUS

## 2013-08-03 ENCOUNTER — Other Ambulatory Visit (HOSPITAL_COMMUNITY): Payer: Self-pay

## 2013-08-04 ENCOUNTER — Encounter (HOSPITAL_COMMUNITY)
Admission: RE | Admit: 2013-08-04 | Discharge: 2013-08-04 | Disposition: A | Discharge status: Home / Self Care | Payer: Medicare Other | Source: Ambulatory Visit | Attending: Nephrology | Admitting: Nephrology

## 2013-08-04 DIAGNOSIS — N184 Chronic kidney disease, stage 4 (severe): Secondary | ICD-10-CM | POA: Insufficient documentation

## 2013-08-04 DIAGNOSIS — D638 Anemia in other chronic diseases classified elsewhere: Secondary | ICD-10-CM | POA: Insufficient documentation

## 2013-08-04 LAB — IRON AND TIBC
IRON: 105 ug/dL (ref 42–135)
Saturation Ratios: 28 % (ref 20–55)
TIBC: 373 ug/dL (ref 215–435)
UIBC: 268 ug/dL (ref 125–400)

## 2013-08-04 LAB — FERRITIN: Ferritin: 851 ng/mL — ABNORMAL HIGH (ref 22–322)

## 2013-08-04 LAB — POCT HEMOGLOBIN-HEMACUE: Hemoglobin: 11.5 g/dL — ABNORMAL LOW (ref 13.0–17.0)

## 2013-08-04 MED ORDER — EPOETIN ALFA 20000 UNIT/ML IJ SOLN
INTRAMUSCULAR | Status: AC
  Administered 2013-08-04: 20000 [IU] via SUBCUTANEOUS
  Filled 2013-08-04: qty 1

## 2013-08-04 MED ORDER — EPOETIN ALFA 20000 UNIT/ML IJ SOLN: 20000.0000 [IU] | INTRAMUSCULAR | Status: DC

## 2013-08-13 ENCOUNTER — Other Ambulatory Visit: Payer: Self-pay | Admitting: Neurosurgery

## 2013-08-13 DIAGNOSIS — Z48812 Encounter for surgical aftercare following surgery on the circulatory system: Secondary | ICD-10-CM

## 2013-08-13 DIAGNOSIS — I6529 Occlusion and stenosis of unspecified carotid artery: Secondary | ICD-10-CM

## 2013-09-01 ENCOUNTER — Encounter (HOSPITAL_COMMUNITY)
Admission: RE | Admit: 2013-09-01 | Discharge: 2013-09-01 | Disposition: A | Discharge status: Home / Self Care | Payer: Medicare Other | Source: Ambulatory Visit | Attending: Nephrology | Admitting: Nephrology

## 2013-09-01 DIAGNOSIS — D638 Anemia in other chronic diseases classified elsewhere: Secondary | ICD-10-CM | POA: Insufficient documentation

## 2013-09-01 DIAGNOSIS — N184 Chronic kidney disease, stage 4 (severe): Secondary | ICD-10-CM | POA: Insufficient documentation

## 2013-09-01 LAB — POCT HEMOGLOBIN-HEMACUE: Hemoglobin: 11.8 g/dL — ABNORMAL LOW (ref 13.0–17.0)

## 2013-09-01 MED ORDER — EPOETIN ALFA 20000 UNIT/ML IJ SOLN
INTRAMUSCULAR | Status: AC
  Filled 2013-09-01: qty 1

## 2013-09-01 MED ORDER — EPOETIN ALFA 20000 UNIT/ML IJ SOLN
20000.0000 [IU] | INTRAMUSCULAR | Status: DC
  Administered 2013-09-01: 20000 [IU] via SUBCUTANEOUS

## 2013-09-16 ENCOUNTER — Other Ambulatory Visit (HOSPITAL_COMMUNITY): Payer: Medicare Other

## 2013-09-16 ENCOUNTER — Ambulatory Visit: Payer: Medicare Other | Admitting: Family

## 2013-09-22 ENCOUNTER — Encounter: Payer: Self-pay | Admitting: Family

## 2013-09-23 ENCOUNTER — Encounter: Payer: Self-pay | Admitting: Family

## 2013-09-23 ENCOUNTER — Ambulatory Visit (HOSPITAL_COMMUNITY)
Admission: RE | Admit: 2013-09-23 | Discharge: 2013-09-23 | Disposition: A | Discharge status: Home / Self Care | Payer: Medicare Other | Source: Ambulatory Visit | Attending: Family | Admitting: Family

## 2013-09-23 ENCOUNTER — Ambulatory Visit (INDEPENDENT_AMBULATORY_CARE_PROVIDER_SITE_OTHER): Payer: Medicare Other | Admitting: Family

## 2013-09-23 VITALS — BP 130/66 | HR 45 | Resp 16 | Ht 68.0 in | Wt 217.0 lb

## 2013-09-23 DIAGNOSIS — Z48812 Encounter for surgical aftercare following surgery on the circulatory system: Secondary | ICD-10-CM

## 2013-09-23 DIAGNOSIS — I6529 Occlusion and stenosis of unspecified carotid artery: Secondary | ICD-10-CM
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
## 2013-09-23 NOTE — Patient Instructions (Signed)

## 2013-09-29 ENCOUNTER — Encounter (HOSPITAL_COMMUNITY)
Admission: RE | Admit: 2013-09-29 | Discharge: 2013-09-29 | Disposition: A | Discharge status: Home / Self Care | Payer: Medicare Other | Source: Ambulatory Visit | Attending: Nephrology | Admitting: Nephrology

## 2013-09-29 DIAGNOSIS — D638 Anemia in other chronic diseases classified elsewhere: Secondary | ICD-10-CM | POA: Insufficient documentation

## 2013-09-29 DIAGNOSIS — N184 Chronic kidney disease, stage 4 (severe): Secondary | ICD-10-CM | POA: Insufficient documentation

## 2013-09-29 LAB — POCT HEMOGLOBIN-HEMACUE: HEMOGLOBIN: 11.4 g/dL — AB (ref 13.0–17.0)

## 2013-09-29 MED ORDER — EPOETIN ALFA 20000 UNIT/ML IJ SOLN
20000.0000 [IU] | INTRAMUSCULAR | Status: DC
  Administered 2013-09-29: 20000 [IU] via SUBCUTANEOUS

## 2013-09-29 MED ORDER — EPOETIN ALFA 20000 UNIT/ML IJ SOLN
INTRAMUSCULAR | Status: AC
  Filled 2013-09-29: qty 1

## 2013-10-27 ENCOUNTER — Encounter (HOSPITAL_COMMUNITY)
Admission: RE | Admit: 2013-10-27 | Discharge: 2013-10-27 | Disposition: A | Discharge status: Home / Self Care | Payer: Medicare Other | Source: Ambulatory Visit | Attending: Nephrology | Admitting: Nephrology

## 2013-10-27 DIAGNOSIS — D638 Anemia in other chronic diseases classified elsewhere: Secondary | ICD-10-CM | POA: Insufficient documentation

## 2013-10-27 DIAGNOSIS — N184 Chronic kidney disease, stage 4 (severe): Secondary | ICD-10-CM | POA: Insufficient documentation

## 2013-10-27 LAB — IRON AND TIBC
IRON: 117 ug/dL (ref 42–135)
SATURATION RATIOS: 30 % (ref 20–55)
TIBC: 384 ug/dL (ref 215–435)
UIBC: 267 ug/dL (ref 125–400)

## 2013-10-27 LAB — POCT HEMOGLOBIN-HEMACUE: Hemoglobin: 11.5 g/dL — ABNORMAL LOW (ref 13.0–17.0)

## 2013-10-27 LAB — FERRITIN: Ferritin: 882 ng/mL — ABNORMAL HIGH (ref 22–322)

## 2013-10-27 MED ORDER — EPOETIN ALFA 20000 UNIT/ML IJ SOLN
INTRAMUSCULAR | Status: AC
  Administered 2013-10-27: 20000 [IU] via SUBCUTANEOUS
  Filled 2013-10-27: qty 1

## 2013-10-27 MED ORDER — EPOETIN ALFA 20000 UNIT/ML IJ SOLN
20000.0000 [IU] | INTRAMUSCULAR | Status: DC
  Administered 2013-10-27: 20000 [IU] via SUBCUTANEOUS

## 2013-11-24 ENCOUNTER — Encounter (HOSPITAL_COMMUNITY)
Admission: RE | Admit: 2013-11-24 | Discharge: 2013-11-24 | Disposition: A | Discharge status: Home / Self Care | Payer: Medicare Other | Source: Ambulatory Visit | Attending: Nephrology | Admitting: Nephrology

## 2013-11-24 DIAGNOSIS — N184 Chronic kidney disease, stage 4 (severe): Secondary | ICD-10-CM | POA: Insufficient documentation

## 2013-11-24 DIAGNOSIS — D638 Anemia in other chronic diseases classified elsewhere: Secondary | ICD-10-CM | POA: Insufficient documentation

## 2013-11-24 LAB — POCT HEMOGLOBIN-HEMACUE: HEMOGLOBIN: 11.2 g/dL — AB (ref 13.0–17.0)

## 2013-11-24 MED ORDER — EPOETIN ALFA 20000 UNIT/ML IJ SOLN
20000.0000 [IU] | INTRAMUSCULAR | Status: DC
  Administered 2013-11-24: 20000 [IU] via SUBCUTANEOUS

## 2013-11-24 MED ORDER — EPOETIN ALFA 20000 UNIT/ML IJ SOLN
INTRAMUSCULAR | Status: AC
  Filled 2013-11-24: qty 1

## 2013-12-22 ENCOUNTER — Encounter (HOSPITAL_COMMUNITY)
Admission: RE | Admit: 2013-12-22 | Discharge: 2013-12-22 | Disposition: A | Discharge status: Home / Self Care | Payer: Medicare Other | Source: Ambulatory Visit | Attending: Nephrology | Admitting: Nephrology

## 2013-12-22 DIAGNOSIS — N184 Chronic kidney disease, stage 4 (severe): Secondary | ICD-10-CM | POA: Insufficient documentation

## 2013-12-22 DIAGNOSIS — D638 Anemia in other chronic diseases classified elsewhere: Secondary | ICD-10-CM | POA: Insufficient documentation

## 2013-12-22 MED ORDER — EPOETIN ALFA 20000 UNIT/ML IJ SOLN
INTRAMUSCULAR | Status: AC
  Filled 2013-12-22: qty 1

## 2013-12-22 MED ORDER — EPOETIN ALFA 20000 UNIT/ML IJ SOLN
20000.0000 [IU] | INTRAMUSCULAR | Status: DC
  Administered 2013-12-22: 20000 [IU] via SUBCUTANEOUS

## 2013-12-23 LAB — POCT HEMOGLOBIN-HEMACUE: Hemoglobin: 11.5 g/dL — ABNORMAL LOW (ref 13.0–17.0)

## 2014-01-19 ENCOUNTER — Encounter (HOSPITAL_COMMUNITY)
Admission: RE | Admit: 2014-01-19 | Discharge: 2014-01-19 | Disposition: A | Discharge status: Home / Self Care | Payer: Medicare Other | Source: Ambulatory Visit | Attending: Nephrology | Admitting: Nephrology

## 2014-01-19 DIAGNOSIS — D638 Anemia in other chronic diseases classified elsewhere: Secondary | ICD-10-CM | POA: Insufficient documentation

## 2014-01-19 DIAGNOSIS — N184 Chronic kidney disease, stage 4 (severe): Secondary | ICD-10-CM | POA: Insufficient documentation

## 2014-01-19 LAB — FERRITIN: Ferritin: 714 ng/mL — ABNORMAL HIGH (ref 22–322)

## 2014-01-19 LAB — IRON AND TIBC
Iron: 115 ug/dL (ref 42–135)
Saturation Ratios: 26 % (ref 20–55)
TIBC: 436 ug/dL — AB (ref 215–435)
UIBC: 321 ug/dL (ref 125–400)

## 2014-01-19 LAB — POCT HEMOGLOBIN-HEMACUE: Hemoglobin: 11.8 g/dL — ABNORMAL LOW (ref 13.0–17.0)

## 2014-01-19 MED ORDER — EPOETIN ALFA 20000 UNIT/ML IJ SOLN
INTRAMUSCULAR | Status: AC
Start: 2014-01-19 — End: 2014-01-19
  Administered 2014-01-19: 20000 [IU] via SUBCUTANEOUS
  Filled 2014-01-19: qty 1

## 2014-01-19 MED ORDER — EPOETIN ALFA 20000 UNIT/ML IJ SOLN
20000.0000 [IU] | INTRAMUSCULAR | Status: DC
  Administered 2014-01-19: 20000 [IU] via SUBCUTANEOUS

## 2014-02-06 ENCOUNTER — Encounter: Payer: Self-pay | Admitting: *Deleted

## 2014-02-19 ENCOUNTER — Encounter (HOSPITAL_COMMUNITY)
Admission: RE | Admit: 2014-02-19 | Discharge: 2014-02-19 | Disposition: A | Discharge status: Home / Self Care | Payer: Medicare Other | Source: Ambulatory Visit | Attending: Nephrology | Admitting: Nephrology

## 2014-02-19 DIAGNOSIS — N184 Chronic kidney disease, stage 4 (severe): Secondary | ICD-10-CM | POA: Diagnosis not present

## 2014-02-19 DIAGNOSIS — D638 Anemia in other chronic diseases classified elsewhere: Secondary | ICD-10-CM | POA: Diagnosis present

## 2014-02-19 LAB — POCT HEMOGLOBIN-HEMACUE: Hemoglobin: 11.6 g/dL — ABNORMAL LOW (ref 13.0–17.0)

## 2014-02-19 MED ORDER — EPOETIN ALFA 20000 UNIT/ML IJ SOLN
20000.0000 [IU] | INTRAMUSCULAR | Status: DC
  Administered 2014-02-19: 20000 [IU] via SUBCUTANEOUS

## 2014-02-19 MED ORDER — EPOETIN ALFA 20000 UNIT/ML IJ SOLN
INTRAMUSCULAR | Status: AC
  Filled 2014-02-19: qty 1

## 2014-03-19 ENCOUNTER — Encounter (HOSPITAL_COMMUNITY)
Admission: RE | Admit: 2014-03-19 | Discharge: 2014-03-19 | Disposition: A | Discharge status: Home / Self Care | Payer: Medicare Other | Source: Ambulatory Visit | Attending: Nephrology | Admitting: Nephrology

## 2014-03-19 DIAGNOSIS — D638 Anemia in other chronic diseases classified elsewhere: Secondary | ICD-10-CM | POA: Insufficient documentation

## 2014-03-19 DIAGNOSIS — N184 Chronic kidney disease, stage 4 (severe): Secondary | ICD-10-CM | POA: Insufficient documentation

## 2014-03-19 LAB — POCT HEMOGLOBIN-HEMACUE: Hemoglobin: 11.2 g/dL — ABNORMAL LOW (ref 13.0–17.0)

## 2014-03-19 MED ORDER — EPOETIN ALFA 20000 UNIT/ML IJ SOLN
20000.0000 [IU] | INTRAMUSCULAR | Status: DC
  Administered 2014-03-19: 20000 [IU] via SUBCUTANEOUS

## 2014-03-19 MED ORDER — EPOETIN ALFA 20000 UNIT/ML IJ SOLN
INTRAMUSCULAR | Status: AC
  Administered 2014-03-19: 20000 [IU] via SUBCUTANEOUS
  Filled 2014-03-19: qty 1

## 2014-04-15 ENCOUNTER — Other Ambulatory Visit (HOSPITAL_COMMUNITY): Payer: Self-pay | Admitting: *Deleted

## 2014-04-16 ENCOUNTER — Encounter (HOSPITAL_COMMUNITY)
Admission: RE | Admit: 2014-04-16 | Discharge: 2014-04-16 | Disposition: A | Discharge status: Home / Self Care | Payer: Medicare Other | Source: Ambulatory Visit | Attending: Nephrology | Admitting: Nephrology

## 2014-04-16 DIAGNOSIS — N184 Chronic kidney disease, stage 4 (severe): Secondary | ICD-10-CM | POA: Diagnosis not present

## 2014-04-16 DIAGNOSIS — D631 Anemia in chronic kidney disease: Secondary | ICD-10-CM | POA: Insufficient documentation

## 2014-04-16 LAB — FERRITIN: Ferritin: 722 ng/mL — ABNORMAL HIGH (ref 22–322)

## 2014-04-16 LAB — POCT HEMOGLOBIN-HEMACUE: HEMOGLOBIN: 10.9 g/dL — AB (ref 13.0–17.0)

## 2014-04-16 LAB — IRON AND TIBC
IRON: 107 ug/dL (ref 42–135)
SATURATION RATIOS: 27 % (ref 20–55)
TIBC: 402 ug/dL (ref 215–435)
UIBC: 295 ug/dL (ref 125–400)

## 2014-04-16 MED ORDER — EPOETIN ALFA 20000 UNIT/ML IJ SOLN
INTRAMUSCULAR | Status: AC
  Filled 2014-04-16: qty 1

## 2014-04-16 MED ORDER — EPOETIN ALFA 20000 UNIT/ML IJ SOLN
20000.0000 [IU] | INTRAMUSCULAR | Status: DC
  Administered 2014-04-16: 20000 [IU] via SUBCUTANEOUS

## 2014-05-14 ENCOUNTER — Encounter (HOSPITAL_COMMUNITY)
Admission: RE | Admit: 2014-05-14 | Discharge: 2014-05-14 | Disposition: A | Discharge status: Home / Self Care | Payer: Medicare Other | Source: Ambulatory Visit | Attending: Nephrology | Admitting: Nephrology

## 2014-05-14 DIAGNOSIS — N184 Chronic kidney disease, stage 4 (severe): Secondary | ICD-10-CM | POA: Diagnosis not present

## 2014-05-14 DIAGNOSIS — D631 Anemia in chronic kidney disease: Secondary | ICD-10-CM | POA: Insufficient documentation

## 2014-05-14 LAB — POCT HEMOGLOBIN-HEMACUE: Hemoglobin: 11.3 g/dL — ABNORMAL LOW (ref 13.0–17.0)

## 2014-05-14 MED ORDER — EPOETIN ALFA 20000 UNIT/ML IJ SOLN: 20000.0000 [IU] | INTRAMUSCULAR | Status: DC

## 2014-05-14 MED ORDER — EPOETIN ALFA 20000 UNIT/ML IJ SOLN
INTRAMUSCULAR | Status: AC
  Administered 2014-05-14: 20000 [IU] via SUBCUTANEOUS
  Filled 2014-05-14: qty 1

## 2014-06-11 ENCOUNTER — Encounter (HOSPITAL_COMMUNITY)
Admission: RE | Admit: 2014-06-11 | Discharge: 2014-06-11 | Disposition: A | Discharge status: Home / Self Care | Payer: Medicare Other | Source: Ambulatory Visit | Attending: Nephrology | Admitting: Nephrology

## 2014-06-11 DIAGNOSIS — D631 Anemia in chronic kidney disease: Secondary | ICD-10-CM | POA: Diagnosis present

## 2014-06-11 DIAGNOSIS — N184 Chronic kidney disease, stage 4 (severe): Secondary | ICD-10-CM | POA: Insufficient documentation

## 2014-06-11 LAB — POCT HEMOGLOBIN-HEMACUE: Hemoglobin: 11.6 g/dL — ABNORMAL LOW (ref 13.0–17.0)

## 2014-06-11 MED ORDER — EPOETIN ALFA 20000 UNIT/ML IJ SOLN
20000.0000 [IU] | INTRAMUSCULAR | Status: DC
  Administered 2014-06-11: 20000 [IU] via SUBCUTANEOUS

## 2014-06-17 ENCOUNTER — Encounter (HOSPITAL_COMMUNITY): Payer: Self-pay | Admitting: Vascular Surgery

## 2014-07-12 ENCOUNTER — Encounter (HOSPITAL_COMMUNITY)
Admission: RE | Admit: 2014-07-12 | Discharge: 2014-07-12 | Disposition: A | Discharge status: Home / Self Care | Payer: Medicare Other | Source: Ambulatory Visit | Attending: Nephrology | Admitting: Nephrology

## 2014-07-12 DIAGNOSIS — D631 Anemia in chronic kidney disease: Secondary | ICD-10-CM | POA: Diagnosis present

## 2014-07-12 DIAGNOSIS — N184 Chronic kidney disease, stage 4 (severe): Secondary | ICD-10-CM | POA: Insufficient documentation

## 2014-07-12 LAB — IRON AND TIBC
Iron: 108 ug/dL (ref 42–165)
Saturation Ratios: 26 % (ref 20–55)
TIBC: 411 ug/dL (ref 215–435)
UIBC: 303 ug/dL (ref 125–400)

## 2014-07-12 LAB — FERRITIN: Ferritin: 524 ng/mL — ABNORMAL HIGH (ref 22–322)

## 2014-07-12 LAB — POCT HEMOGLOBIN-HEMACUE: Hemoglobin: 11.2 g/dL — ABNORMAL LOW (ref 13.0–17.0)

## 2014-07-12 MED ORDER — EPOETIN ALFA 20000 UNIT/ML IJ SOLN
INTRAMUSCULAR | Status: AC
  Filled 2014-07-12: qty 1

## 2014-07-12 MED ORDER — EPOETIN ALFA 20000 UNIT/ML IJ SOLN
20000.0000 [IU] | INTRAMUSCULAR | Status: DC
  Administered 2014-07-12: 20000 [IU] via SUBCUTANEOUS

## 2014-08-09 ENCOUNTER — Encounter (HOSPITAL_COMMUNITY)
Admission: RE | Admit: 2014-08-09 | Discharge: 2014-08-09 | Disposition: A | Discharge status: Home / Self Care | Payer: Medicare Other | Source: Ambulatory Visit | Attending: Nephrology | Admitting: Nephrology

## 2014-08-09 DIAGNOSIS — D631 Anemia in chronic kidney disease: Secondary | ICD-10-CM | POA: Insufficient documentation

## 2014-08-09 DIAGNOSIS — N184 Chronic kidney disease, stage 4 (severe): Secondary | ICD-10-CM | POA: Insufficient documentation

## 2014-08-09 LAB — POCT HEMOGLOBIN-HEMACUE: Hemoglobin: 11.4 g/dL — ABNORMAL LOW (ref 13.0–17.0)

## 2014-08-09 MED ORDER — EPOETIN ALFA 20000 UNIT/ML IJ SOLN
20000.0000 [IU] | INTRAMUSCULAR | Status: DC
  Administered 2014-08-09: 20000 [IU] via SUBCUTANEOUS

## 2014-08-09 MED ORDER — EPOETIN ALFA 20000 UNIT/ML IJ SOLN
INTRAMUSCULAR | Status: AC
  Filled 2014-08-09: qty 1

## 2014-09-13 ENCOUNTER — Encounter (HOSPITAL_COMMUNITY)
Admission: RE | Admit: 2014-09-13 | Discharge: 2014-09-13 | Disposition: A | Discharge status: Home / Self Care | Payer: Medicare Other | Source: Ambulatory Visit | Attending: Nephrology | Admitting: Nephrology

## 2014-09-13 DIAGNOSIS — D631 Anemia in chronic kidney disease: Secondary | ICD-10-CM | POA: Insufficient documentation

## 2014-09-13 DIAGNOSIS — N184 Chronic kidney disease, stage 4 (severe): Secondary | ICD-10-CM | POA: Diagnosis not present

## 2014-09-13 LAB — POCT HEMOGLOBIN-HEMACUE: HEMOGLOBIN: 10.9 g/dL — AB (ref 13.0–17.0)

## 2014-09-13 MED ORDER — EPOETIN ALFA 20000 UNIT/ML IJ SOLN
INTRAMUSCULAR | Status: AC
  Filled 2014-09-13: qty 1

## 2014-09-13 MED ORDER — EPOETIN ALFA 20000 UNIT/ML IJ SOLN
20000.0000 [IU] | INTRAMUSCULAR | Status: DC
  Administered 2014-09-13: 20000 [IU] via SUBCUTANEOUS

## 2014-09-28 ENCOUNTER — Encounter: Payer: Self-pay | Admitting: Family

## 2014-09-29 ENCOUNTER — Ambulatory Visit (INDEPENDENT_AMBULATORY_CARE_PROVIDER_SITE_OTHER): Payer: Medicare Other | Admitting: Family

## 2014-09-29 ENCOUNTER — Encounter: Payer: Self-pay | Admitting: Family

## 2014-09-29 ENCOUNTER — Ambulatory Visit (HOSPITAL_COMMUNITY)
Admission: RE | Admit: 2014-09-29 | Discharge: 2014-09-29 | Disposition: A | Discharge status: Home / Self Care | Payer: Medicare Other | Source: Ambulatory Visit | Attending: Family | Admitting: Family

## 2014-09-29 VITALS — BP 130/60 | HR 58 | Resp 16 | Ht 68.0 in | Wt 210.0 lb

## 2014-09-29 DIAGNOSIS — I6521 Occlusion and stenosis of right carotid artery: Secondary | ICD-10-CM | POA: Diagnosis not present

## 2014-09-29 DIAGNOSIS — Z9889 Other specified postprocedural states: Secondary | ICD-10-CM | POA: Insufficient documentation

## 2014-09-29 DIAGNOSIS — Z87891 Personal history of nicotine dependence: Secondary | ICD-10-CM | POA: Diagnosis not present

## 2014-09-29 DIAGNOSIS — Z48812 Encounter for surgical aftercare following surgery on the circulatory system: Secondary | ICD-10-CM

## 2014-09-29 DIAGNOSIS — I6523 Occlusion and stenosis of bilateral carotid arteries: Secondary | ICD-10-CM
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
## 2014-09-29 NOTE — Patient Instructions (Signed)
Stroke Prevention Some medical conditions and behaviors are associated with an increased chance of having a stroke. You may prevent a stroke by making healthy choices and managing medical conditions. HOW CAN I REDUCE MY RISK OF HAVING A STROKE?   Stay physically active. Get at least 30 minutes of activity on most or all days.  Do not smoke. It may also be helpful to avoid exposure to secondhand smoke.  Limit alcohol use. Moderate alcohol use is considered to be:  No more than 2 drinks per day for men.  No more than 1 drink per day for nonpregnant women.  Eat healthy foods. This involves:  Eating 5 or more servings of fruits and vegetables a day.  Making dietary changes that address high blood pressure (hypertension), high cholesterol, diabetes, or obesity.  Manage your cholesterol levels.  Making food choices that are high in fiber and low in saturated fat, trans fat, and cholesterol may control cholesterol levels.  Take any prescribed medicines to control cholesterol as directed by your health care provider.  Manage your diabetes.  Controlling your carbohydrate and sugar intake is recommended to manage diabetes.  Take any prescribed medicines to control diabetes as directed by your health care provider.  Control your hypertension.  Making food choices that are low in salt (sodium), saturated fat, trans fat, and cholesterol is recommended to manage hypertension.  Take any prescribed medicines to control hypertension as directed by your health care provider.  Maintain a healthy weight.  Reducing calorie intake and making food choices that are low in sodium, saturated fat, trans fat, and cholesterol are recommended to manage weight.  Stop drug abuse.  Avoid taking birth control pills.  Talk to your health care provider about the risks of taking birth control pills if you are over 35 years old, smoke, get migraines, or have ever had a blood clot.  Get evaluated for sleep  disorders (sleep apnea).  Talk to your health care provider about getting a sleep evaluation if you snore a lot or have excessive sleepiness.  Take medicines only as directed by your health care provider.  For some people, aspirin or blood thinners (anticoagulants) are helpful in reducing the risk of forming abnormal blood clots that can lead to stroke. If you have the irregular heart rhythm of atrial fibrillation, you should be on a blood thinner unless there is a good reason you cannot take them.  Understand all your medicine instructions.  Make sure that other conditions (such as anemia or atherosclerosis) are addressed. SEEK IMMEDIATE MEDICAL CARE IF:   You have sudden weakness or numbness of the face, arm, or leg, especially on one side of the body.  Your face or eyelid droops to one side.  You have sudden confusion.  You have trouble speaking (aphasia) or understanding.  You have sudden trouble seeing in one or both eyes.  You have sudden trouble walking.  You have dizziness.  You have a loss of balance or coordination.  You have a sudden, severe headache with no known cause.  You have new chest pain or an irregular heartbeat. Any of these symptoms may represent a serious problem that is an emergency. Do not wait to see if the symptoms will go away. Get medical help at once. Call your local emergency services (911 in [place] Do not drive yourself to the hospital. Document Released: 08/02/2004 Document Revised: 11/09/2013 Document Reviewed: 12/26/2012 [employer] Patient Information 2015 [employer]. This information is not intended to replace advice given   to you by your health care provider. Make sure you discuss any questions you have with your health care provider.  

## 2014-10-11 ENCOUNTER — Encounter (HOSPITAL_COMMUNITY)
Admission: RE | Admit: 2014-10-11 | Discharge: 2014-10-11 | Disposition: A | Discharge status: Home / Self Care | Payer: Medicare Other | Source: Ambulatory Visit | Attending: Nephrology | Admitting: Nephrology

## 2014-10-11 DIAGNOSIS — D631 Anemia in chronic kidney disease: Secondary | ICD-10-CM | POA: Insufficient documentation

## 2014-10-11 DIAGNOSIS — N184 Chronic kidney disease, stage 4 (severe): Secondary | ICD-10-CM | POA: Diagnosis not present

## 2014-10-11 LAB — IRON AND TIBC
Iron: 93 ug/dL (ref 42–165)
Saturation Ratios: 24 % (ref 20–55)
TIBC: 394 ug/dL (ref 215–435)
UIBC: 301 ug/dL (ref 125–400)

## 2014-10-11 LAB — POCT HEMOGLOBIN-HEMACUE: Hemoglobin: 10.5 g/dL — ABNORMAL LOW (ref 13.0–17.0)

## 2014-10-11 LAB — FERRITIN: Ferritin: 573 ng/mL — ABNORMAL HIGH (ref 22–322)

## 2014-10-11 MED ORDER — EPOETIN ALFA 20000 UNIT/ML IJ SOLN
INTRAMUSCULAR | Status: AC
  Filled 2014-10-11: qty 1

## 2014-10-11 MED ORDER — EPOETIN ALFA 20000 UNIT/ML IJ SOLN
20000.0000 [IU] | INTRAMUSCULAR | Status: DC
  Administered 2014-10-11: 20000 [IU] via SUBCUTANEOUS

## 2014-10-11 MED ORDER — EPOETIN ALFA 20000 UNIT/ML IJ SOLN
INTRAMUSCULAR | Status: AC
  Administered 2014-10-11: 20000 [IU] via SUBCUTANEOUS
  Filled 2014-10-11: qty 1

## 2014-11-05 ENCOUNTER — Other Ambulatory Visit (HOSPITAL_COMMUNITY): Payer: Self-pay | Admitting: *Deleted

## 2014-11-08 ENCOUNTER — Encounter (HOSPITAL_COMMUNITY)
Admission: RE | Admit: 2014-11-08 | Discharge: 2014-11-08 | Disposition: A | Discharge status: Home / Self Care | Payer: Medicare Other | Source: Ambulatory Visit | Attending: Nephrology | Admitting: Nephrology

## 2014-11-08 DIAGNOSIS — N184 Chronic kidney disease, stage 4 (severe): Secondary | ICD-10-CM | POA: Insufficient documentation

## 2014-11-08 DIAGNOSIS — D631 Anemia in chronic kidney disease: Secondary | ICD-10-CM | POA: Diagnosis not present

## 2014-11-08 LAB — POCT HEMOGLOBIN-HEMACUE: Hemoglobin: 10.9 g/dL — ABNORMAL LOW (ref 13.0–17.0)

## 2014-11-08 MED ORDER — EPOETIN ALFA 20000 UNIT/ML IJ SOLN
INTRAMUSCULAR | Status: AC
  Filled 2014-11-08: qty 1

## 2014-11-08 MED ORDER — EPOETIN ALFA 20000 UNIT/ML IJ SOLN
20000.0000 [IU] | INTRAMUSCULAR | Status: DC
  Administered 2014-11-08: 20000 [IU] via SUBCUTANEOUS

## 2014-12-07 ENCOUNTER — Encounter (HOSPITAL_COMMUNITY)
Admission: RE | Admit: 2014-12-07 | Discharge: 2014-12-07 | Disposition: A | Discharge status: Home / Self Care | Payer: Medicare Other | Source: Ambulatory Visit | Attending: Nephrology | Admitting: Nephrology

## 2014-12-07 DIAGNOSIS — D631 Anemia in chronic kidney disease: Secondary | ICD-10-CM | POA: Diagnosis not present

## 2014-12-07 LAB — POCT HEMOGLOBIN-HEMACUE: HEMOGLOBIN: 10.2 g/dL — AB (ref 13.0–17.0)

## 2014-12-07 MED ORDER — EPOETIN ALFA 20000 UNIT/ML IJ SOLN
INTRAMUSCULAR | Status: AC
  Filled 2014-12-07: qty 1

## 2014-12-07 MED ORDER — EPOETIN ALFA 20000 UNIT/ML IJ SOLN
20000.0000 [IU] | INTRAMUSCULAR | Status: DC
  Administered 2014-12-07: 20000 [IU] via SUBCUTANEOUS

## 2015-01-03 ENCOUNTER — Encounter (HOSPITAL_COMMUNITY)
Admission: RE | Admit: 2015-01-03 | Discharge: 2015-01-03 | Disposition: A | Discharge status: Home / Self Care | Payer: Medicare Other | Source: Ambulatory Visit | Attending: Nephrology | Admitting: Nephrology

## 2015-01-03 DIAGNOSIS — N184 Chronic kidney disease, stage 4 (severe): Secondary | ICD-10-CM | POA: Diagnosis not present

## 2015-01-03 DIAGNOSIS — D631 Anemia in chronic kidney disease: Secondary | ICD-10-CM | POA: Diagnosis present

## 2015-01-03 LAB — POCT HEMOGLOBIN-HEMACUE: HEMOGLOBIN: 11 g/dL — AB (ref 13.0–17.0)

## 2015-01-03 MED ORDER — EPOETIN ALFA 20000 UNIT/ML IJ SOLN
INTRAMUSCULAR | Status: AC
  Administered 2015-01-03: 20000 [IU] via SUBCUTANEOUS
  Filled 2015-01-03: qty 1

## 2015-01-03 MED ORDER — EPOETIN ALFA 20000 UNIT/ML IJ SOLN
20000.0000 [IU] | INTRAMUSCULAR | Status: DC
  Administered 2015-01-03: 20000 [IU] via SUBCUTANEOUS

## 2015-01-28 ENCOUNTER — Other Ambulatory Visit (HOSPITAL_COMMUNITY): Payer: Self-pay | Admitting: *Deleted

## 2015-01-31 ENCOUNTER — Encounter (HOSPITAL_COMMUNITY)
Admission: RE | Admit: 2015-01-31 | Discharge: 2015-01-31 | Disposition: A | Discharge status: Home / Self Care | Payer: Medicare Other | Source: Ambulatory Visit | Attending: Nephrology | Admitting: Nephrology

## 2015-01-31 DIAGNOSIS — N184 Chronic kidney disease, stage 4 (severe): Secondary | ICD-10-CM | POA: Diagnosis not present

## 2015-01-31 DIAGNOSIS — D631 Anemia in chronic kidney disease: Secondary | ICD-10-CM | POA: Insufficient documentation

## 2015-01-31 LAB — RENAL FUNCTION PANEL
ALBUMIN: 3.8 g/dL (ref 3.5–5.0)
Anion gap: 10 (ref 5–15)
BUN: 67 mg/dL — AB (ref 6–20)
CHLORIDE: 104 mmol/L (ref 101–111)
CO2: 27 mmol/L (ref 22–32)
Calcium: 9.4 mg/dL (ref 8.9–10.3)
Creatinine, Ser: 4.45 mg/dL — ABNORMAL HIGH (ref 0.61–1.24)
GFR calc Af Amer: 14 mL/min — ABNORMAL LOW (ref >60)
GFR calc non Af Amer: 12 mL/min — ABNORMAL LOW (ref >60)
Glucose, Bld: 108 mg/dL — ABNORMAL HIGH (ref 65–99)
POTASSIUM: 5.3 mmol/L — AB (ref 3.5–5.1)
Phosphorus: 5.3 mg/dL — ABNORMAL HIGH (ref 2.5–4.6)
Sodium: 141 mmol/L (ref 135–145)

## 2015-01-31 LAB — POCT HEMOGLOBIN-HEMACUE: HEMOGLOBIN: 11.2 g/dL — AB (ref 13.0–17.0)

## 2015-01-31 LAB — IRON AND TIBC
Iron: 82 ug/dL (ref 45–182)
SATURATION RATIOS: 19 % (ref 17.9–39.5)
TIBC: 442 ug/dL (ref 250–450)
UIBC: 360 ug/dL

## 2015-01-31 LAB — FERRITIN: Ferritin: 494 ng/mL — ABNORMAL HIGH (ref 24–336)

## 2015-01-31 MED ORDER — EPOETIN ALFA 20000 UNIT/ML IJ SOLN
20000.0000 [IU] | INTRAMUSCULAR | Status: DC
  Administered 2015-01-31: 20000 [IU] via SUBCUTANEOUS

## 2015-01-31 MED ORDER — EPOETIN ALFA 20000 UNIT/ML IJ SOLN
INTRAMUSCULAR | Status: AC
  Filled 2015-01-31: qty 1

## 2015-02-28 ENCOUNTER — Encounter (HOSPITAL_COMMUNITY)
Admission: RE | Admit: 2015-02-28 | Discharge: 2015-02-28 | Disposition: A | Discharge status: Home / Self Care | Payer: Medicare Other | Source: Ambulatory Visit | Attending: Nephrology | Admitting: Nephrology

## 2015-02-28 DIAGNOSIS — Z5181 Encounter for therapeutic drug level monitoring: Secondary | ICD-10-CM | POA: Insufficient documentation

## 2015-02-28 DIAGNOSIS — D631 Anemia in chronic kidney disease: Secondary | ICD-10-CM | POA: Diagnosis not present

## 2015-02-28 DIAGNOSIS — N184 Chronic kidney disease, stage 4 (severe): Secondary | ICD-10-CM | POA: Insufficient documentation

## 2015-02-28 DIAGNOSIS — Z79899 Other long term (current) drug therapy: Secondary | ICD-10-CM | POA: Insufficient documentation

## 2015-02-28 LAB — POCT HEMOGLOBIN-HEMACUE: HEMOGLOBIN: 9.9 g/dL — AB (ref 13.0–17.0)

## 2015-02-28 MED ORDER — EPOETIN ALFA 20000 UNIT/ML IJ SOLN
20000.0000 [IU] | INTRAMUSCULAR | Status: DC
  Administered 2015-02-28: 20000 [IU] via SUBCUTANEOUS

## 2015-02-28 MED ORDER — EPOETIN ALFA 20000 UNIT/ML IJ SOLN
INTRAMUSCULAR | Status: AC
  Filled 2015-02-28: qty 1

## 2015-03-28 ENCOUNTER — Encounter (HOSPITAL_COMMUNITY)
Admission: RE | Admit: 2015-03-28 | Discharge: 2015-03-28 | Disposition: A | Discharge status: Home / Self Care | Payer: Medicare Other | Source: Ambulatory Visit | Attending: Nephrology | Admitting: Nephrology

## 2015-03-28 DIAGNOSIS — N184 Chronic kidney disease, stage 4 (severe): Secondary | ICD-10-CM | POA: Insufficient documentation

## 2015-03-28 DIAGNOSIS — D631 Anemia in chronic kidney disease: Secondary | ICD-10-CM | POA: Diagnosis present

## 2015-03-28 LAB — POCT HEMOGLOBIN-HEMACUE: HEMOGLOBIN: 11.3 g/dL — AB (ref 13.0–17.0)

## 2015-03-28 MED ORDER — EPOETIN ALFA 20000 UNIT/ML IJ SOLN
INTRAMUSCULAR | Status: AC
  Filled 2015-03-28: qty 1

## 2015-03-28 MED ORDER — EPOETIN ALFA 20000 UNIT/ML IJ SOLN
20000.0000 [IU] | INTRAMUSCULAR | Status: DC
  Administered 2015-03-28: 20000 [IU] via SUBCUTANEOUS

## 2015-04-22 ENCOUNTER — Other Ambulatory Visit (HOSPITAL_COMMUNITY): Payer: Self-pay | Admitting: *Deleted

## 2015-04-25 ENCOUNTER — Encounter (HOSPITAL_COMMUNITY)
Admission: RE | Admit: 2015-04-25 | Discharge: 2015-04-25 | Disposition: A | Discharge status: Home / Self Care | Payer: Medicare Other | Source: Ambulatory Visit | Attending: Nephrology | Admitting: Nephrology

## 2015-04-25 DIAGNOSIS — D631 Anemia in chronic kidney disease: Secondary | ICD-10-CM | POA: Insufficient documentation

## 2015-04-25 DIAGNOSIS — N184 Chronic kidney disease, stage 4 (severe): Secondary | ICD-10-CM | POA: Insufficient documentation

## 2015-04-25 LAB — IRON AND TIBC
Iron: 83 ug/dL (ref 45–182)
SATURATION RATIOS: 18 % (ref 17.9–39.5)
TIBC: 463 ug/dL — ABNORMAL HIGH (ref 250–450)
UIBC: 380 ug/dL

## 2015-04-25 LAB — FERRITIN: Ferritin: 497 ng/mL — ABNORMAL HIGH (ref 24–336)

## 2015-04-25 LAB — POCT HEMOGLOBIN-HEMACUE: Hemoglobin: 10.7 g/dL — ABNORMAL LOW (ref 13.0–17.0)

## 2015-04-25 MED ORDER — EPOETIN ALFA 20000 UNIT/ML IJ SOLN
INTRAMUSCULAR | Status: AC
  Administered 2015-04-25: 20000 [IU] via SUBCUTANEOUS
  Filled 2015-04-25: qty 1

## 2015-04-25 MED ORDER — EPOETIN ALFA 20000 UNIT/ML IJ SOLN: 20000.0000 [IU] | INTRAMUSCULAR | Status: DC

## 2015-04-25 MED ORDER — FERUMOXYTOL INJECTION 510 MG/17 ML
510.0000 mg | INTRAVENOUS | Status: DC
  Administered 2015-04-25: 510 mg via INTRAVENOUS
  Filled 2015-04-25: qty 17

## 2015-05-13 ENCOUNTER — Encounter (HOSPITAL_COMMUNITY): Payer: Medicare Other

## 2015-05-23 ENCOUNTER — Encounter (HOSPITAL_COMMUNITY)
Admission: RE | Admit: 2015-05-23 | Discharge: 2015-05-23 | Disposition: A | Discharge status: Home / Self Care | Payer: Medicare Other | Source: Ambulatory Visit | Attending: Nephrology | Admitting: Nephrology

## 2015-05-23 DIAGNOSIS — D631 Anemia in chronic kidney disease: Secondary | ICD-10-CM | POA: Insufficient documentation

## 2015-05-23 DIAGNOSIS — N184 Chronic kidney disease, stage 4 (severe): Secondary | ICD-10-CM | POA: Insufficient documentation

## 2015-05-23 LAB — POCT HEMOGLOBIN-HEMACUE: Hemoglobin: 11 g/dL — ABNORMAL LOW (ref 13.0–17.0)

## 2015-05-23 MED ORDER — EPOETIN ALFA 20000 UNIT/ML IJ SOLN
INTRAMUSCULAR | Status: AC
  Filled 2015-05-23: qty 1

## 2015-05-23 MED ORDER — SODIUM CHLORIDE 0.9 % IV SOLN
510.0000 mg | INTRAVENOUS | Status: AC
  Administered 2015-05-23: 510 mg via INTRAVENOUS
  Filled 2015-05-23: qty 17

## 2015-05-23 MED ORDER — EPOETIN ALFA 20000 UNIT/ML IJ SOLN
20000.0000 [IU] | INTRAMUSCULAR | Status: DC
  Administered 2015-05-23: 20000 [IU] via SUBCUTANEOUS

## 2015-06-20 ENCOUNTER — Encounter (HOSPITAL_COMMUNITY)
Admission: RE | Admit: 2015-06-20 | Discharge: 2015-06-20 | Disposition: A | Discharge status: Home / Self Care | Payer: Medicare Other | Source: Ambulatory Visit | Attending: Nephrology | Admitting: Nephrology

## 2015-06-20 DIAGNOSIS — D631 Anemia in chronic kidney disease: Secondary | ICD-10-CM | POA: Diagnosis not present

## 2015-06-20 DIAGNOSIS — Z79899 Other long term (current) drug therapy: Secondary | ICD-10-CM | POA: Insufficient documentation

## 2015-06-20 DIAGNOSIS — N184 Chronic kidney disease, stage 4 (severe): Secondary | ICD-10-CM | POA: Diagnosis not present

## 2015-06-20 DIAGNOSIS — Z5181 Encounter for therapeutic drug level monitoring: Secondary | ICD-10-CM | POA: Insufficient documentation

## 2015-06-20 LAB — POCT HEMOGLOBIN-HEMACUE: HEMOGLOBIN: 10.3 g/dL — AB (ref 13.0–17.0)

## 2015-06-20 MED ORDER — EPOETIN ALFA 20000 UNIT/ML IJ SOLN
INTRAMUSCULAR | Status: AC
Start: 2015-06-20 — End: 2015-06-20
  Filled 2015-06-20: qty 1

## 2015-06-20 MED ORDER — EPOETIN ALFA 20000 UNIT/ML IJ SOLN
20000.0000 [IU] | INTRAMUSCULAR | Status: DC
  Administered 2015-06-20: 20000 [IU] via SUBCUTANEOUS

## 2015-07-18 ENCOUNTER — Encounter (HOSPITAL_COMMUNITY)
Admission: RE | Admit: 2015-07-18 | Discharge: 2015-07-18 | Disposition: A | Discharge status: Home / Self Care | Payer: Medicare Other | Source: Ambulatory Visit | Attending: Nephrology | Admitting: Nephrology

## 2015-07-18 DIAGNOSIS — N184 Chronic kidney disease, stage 4 (severe): Secondary | ICD-10-CM | POA: Insufficient documentation

## 2015-07-18 DIAGNOSIS — D631 Anemia in chronic kidney disease: Secondary | ICD-10-CM | POA: Insufficient documentation

## 2015-07-18 LAB — FERRITIN: Ferritin: 823 ng/mL — ABNORMAL HIGH (ref 24–336)

## 2015-07-18 LAB — IRON AND TIBC
IRON: 108 ug/dL (ref 45–182)
Saturation Ratios: 27 % (ref 17.9–39.5)
TIBC: 406 ug/dL (ref 250–450)
UIBC: 298 ug/dL

## 2015-07-18 LAB — POCT HEMOGLOBIN-HEMACUE: Hemoglobin: 10.9 g/dL — ABNORMAL LOW (ref 13.0–17.0)

## 2015-07-18 MED ORDER — EPOETIN ALFA 20000 UNIT/ML IJ SOLN
INTRAMUSCULAR | Status: AC
Start: 2015-07-18 — End: 2015-07-18
  Filled 2015-07-18: qty 1

## 2015-07-18 MED ORDER — EPOETIN ALFA 20000 UNIT/ML IJ SOLN
20000.0000 [IU] | INTRAMUSCULAR | Status: DC
  Administered 2015-07-18: 20000 [IU] via SUBCUTANEOUS

## 2015-08-15 ENCOUNTER — Ambulatory Visit (HOSPITAL_COMMUNITY)
Admission: RE | Admit: 2015-08-15 | Discharge: 2015-08-15 | Disposition: A | Discharge status: Home / Self Care | Payer: Medicare Other | Source: Ambulatory Visit | Attending: Nephrology | Admitting: Nephrology

## 2015-08-15 DIAGNOSIS — N184 Chronic kidney disease, stage 4 (severe): Secondary | ICD-10-CM | POA: Insufficient documentation

## 2015-08-15 DIAGNOSIS — Z79899 Other long term (current) drug therapy: Secondary | ICD-10-CM | POA: Diagnosis not present

## 2015-08-15 DIAGNOSIS — D631 Anemia in chronic kidney disease: Secondary | ICD-10-CM | POA: Insufficient documentation

## 2015-08-15 DIAGNOSIS — Z5181 Encounter for therapeutic drug level monitoring: Secondary | ICD-10-CM | POA: Insufficient documentation

## 2015-08-15 LAB — POCT HEMOGLOBIN-HEMACUE: Hemoglobin: 11.1 g/dL — ABNORMAL LOW (ref 13.0–17.0)

## 2015-08-15 MED ORDER — EPOETIN ALFA 20000 UNIT/ML IJ SOLN
INTRAMUSCULAR | Status: AC
  Filled 2015-08-15: qty 1

## 2015-08-15 MED ORDER — EPOETIN ALFA 20000 UNIT/ML IJ SOLN
20000.0000 [IU] | INTRAMUSCULAR | Status: DC
  Administered 2015-08-15: 20000 [IU] via SUBCUTANEOUS

## 2015-09-12 ENCOUNTER — Encounter (HOSPITAL_COMMUNITY)
Admission: RE | Admit: 2015-09-12 | Discharge: 2015-09-12 | Disposition: A | Discharge status: Home / Self Care | Payer: Medicare Other | Source: Ambulatory Visit | Attending: Nephrology | Admitting: Nephrology

## 2015-09-12 DIAGNOSIS — N184 Chronic kidney disease, stage 4 (severe): Secondary | ICD-10-CM | POA: Insufficient documentation

## 2015-09-12 DIAGNOSIS — D631 Anemia in chronic kidney disease: Secondary | ICD-10-CM | POA: Diagnosis present

## 2015-09-12 LAB — POCT HEMOGLOBIN-HEMACUE: HEMOGLOBIN: 10.6 g/dL — AB (ref 13.0–17.0)

## 2015-09-12 MED ORDER — EPOETIN ALFA 20000 UNIT/ML IJ SOLN
20000.0000 [IU] | INTRAMUSCULAR | Status: DC
  Administered 2015-09-12: 20000 [IU] via SUBCUTANEOUS

## 2015-09-12 MED ORDER — EPOETIN ALFA 20000 UNIT/ML IJ SOLN
INTRAMUSCULAR | Status: AC
  Filled 2015-09-12: qty 1

## 2015-09-28 ENCOUNTER — Encounter: Payer: Self-pay | Admitting: Family

## 2015-10-05 ENCOUNTER — Ambulatory Visit: Payer: Medicare Other | Admitting: Family

## 2015-10-05 ENCOUNTER — Inpatient Hospital Stay (HOSPITAL_COMMUNITY): Admission: RE | Admit: 2015-10-05 | Payer: Medicare Other | Source: Ambulatory Visit

## 2015-10-10 ENCOUNTER — Encounter (HOSPITAL_COMMUNITY)
Admission: RE | Admit: 2015-10-10 | Discharge: 2015-10-10 | Disposition: A | Discharge status: Home / Self Care | Payer: Medicare Other | Source: Ambulatory Visit | Attending: Nephrology | Admitting: Nephrology

## 2015-10-10 DIAGNOSIS — D631 Anemia in chronic kidney disease: Secondary | ICD-10-CM | POA: Diagnosis present

## 2015-10-10 DIAGNOSIS — N184 Chronic kidney disease, stage 4 (severe): Secondary | ICD-10-CM | POA: Diagnosis not present

## 2015-10-10 LAB — IRON AND TIBC
IRON: 83 ug/dL (ref 45–182)
SATURATION RATIOS: 19 % (ref 17.9–39.5)
TIBC: 433 ug/dL (ref 250–450)
UIBC: 350 ug/dL

## 2015-10-10 LAB — POCT HEMOGLOBIN-HEMACUE: HEMOGLOBIN: 10.3 g/dL — AB (ref 13.0–17.0)

## 2015-10-10 LAB — FERRITIN: Ferritin: 816 ng/mL — ABNORMAL HIGH (ref 24–336)

## 2015-10-10 MED ORDER — EPOETIN ALFA 20000 UNIT/ML IJ SOLN: 20000.0000 [IU] | INTRAMUSCULAR | Status: DC

## 2015-10-10 MED ORDER — EPOETIN ALFA 20000 UNIT/ML IJ SOLN
INTRAMUSCULAR | Status: AC
  Administered 2015-10-10: 20000 [IU]
  Filled 2015-10-10: qty 1

## 2015-11-07 ENCOUNTER — Encounter (HOSPITAL_COMMUNITY)
Admission: RE | Admit: 2015-11-07 | Discharge: 2015-11-07 | Disposition: A | Discharge status: Home / Self Care | Payer: Medicare Other | Source: Ambulatory Visit | Attending: Nephrology | Admitting: Nephrology

## 2015-11-07 DIAGNOSIS — N184 Chronic kidney disease, stage 4 (severe): Secondary | ICD-10-CM | POA: Insufficient documentation

## 2015-11-07 DIAGNOSIS — D631 Anemia in chronic kidney disease: Secondary | ICD-10-CM | POA: Insufficient documentation

## 2015-11-07 LAB — POCT HEMOGLOBIN-HEMACUE: HEMOGLOBIN: 10.2 g/dL — AB (ref 13.0–17.0)

## 2015-11-07 MED ORDER — EPOETIN ALFA 20000 UNIT/ML IJ SOLN
INTRAMUSCULAR | Status: AC
  Filled 2015-11-07: qty 1

## 2015-11-07 MED ORDER — EPOETIN ALFA 20000 UNIT/ML IJ SOLN
20000.0000 [IU] | INTRAMUSCULAR | Status: DC
  Administered 2015-11-07: 20000 [IU] via SUBCUTANEOUS

## 2015-12-06 ENCOUNTER — Encounter (HOSPITAL_COMMUNITY)
Admission: RE | Admit: 2015-12-06 | Discharge: 2015-12-06 | Disposition: A | Discharge status: Home / Self Care | Payer: Medicare Other | Source: Ambulatory Visit | Attending: Nephrology | Admitting: Nephrology

## 2015-12-06 DIAGNOSIS — D631 Anemia in chronic kidney disease: Secondary | ICD-10-CM | POA: Diagnosis not present

## 2015-12-06 LAB — POCT HEMOGLOBIN-HEMACUE: HEMOGLOBIN: 10.4 g/dL — AB (ref 13.0–17.0)

## 2015-12-06 MED ORDER — EPOETIN ALFA 20000 UNIT/ML IJ SOLN
INTRAMUSCULAR | Status: AC
  Administered 2015-12-06: 20000 [IU] via SUBCUTANEOUS
  Filled 2015-12-06: qty 1

## 2015-12-06 MED ORDER — EPOETIN ALFA 20000 UNIT/ML IJ SOLN
20000.0000 [IU] | INTRAMUSCULAR | Status: DC
  Administered 2015-12-06: 20000 [IU] via SUBCUTANEOUS

## 2016-01-02 ENCOUNTER — Encounter (HOSPITAL_COMMUNITY)
Admission: RE | Admit: 2016-01-02 | Discharge: 2016-01-02 | Disposition: A | Discharge status: Home / Self Care | Payer: Medicare Other | Source: Ambulatory Visit | Attending: Nephrology | Admitting: Nephrology

## 2016-01-02 DIAGNOSIS — D631 Anemia in chronic kidney disease: Secondary | ICD-10-CM | POA: Diagnosis present

## 2016-01-02 DIAGNOSIS — N184 Chronic kidney disease, stage 4 (severe): Secondary | ICD-10-CM | POA: Diagnosis not present

## 2016-01-02 LAB — POCT HEMOGLOBIN-HEMACUE: HEMOGLOBIN: 10.3 g/dL — AB (ref 13.0–17.0)

## 2016-01-02 MED ORDER — EPOETIN ALFA 20000 UNIT/ML IJ SOLN
INTRAMUSCULAR | Status: AC
  Filled 2016-01-02: qty 1

## 2016-01-02 MED ORDER — EPOETIN ALFA 20000 UNIT/ML IJ SOLN
20000.0000 [IU] | INTRAMUSCULAR | Status: DC
  Administered 2016-01-02: 20000 [IU] via SUBCUTANEOUS

## 2016-01-30 ENCOUNTER — Encounter (HOSPITAL_COMMUNITY)
Admission: RE | Admit: 2016-01-30 | Discharge: 2016-01-30 | Disposition: A | Discharge status: Home / Self Care | Payer: Medicare Other | Source: Ambulatory Visit | Attending: Nephrology | Admitting: Nephrology

## 2016-01-30 DIAGNOSIS — N184 Chronic kidney disease, stage 4 (severe): Secondary | ICD-10-CM | POA: Diagnosis not present

## 2016-01-30 DIAGNOSIS — D631 Anemia in chronic kidney disease: Secondary | ICD-10-CM | POA: Insufficient documentation

## 2016-01-30 LAB — IRON AND TIBC
IRON: 67 ug/dL (ref 45–182)
Saturation Ratios: 17 % — ABNORMAL LOW (ref 17.9–39.5)
TIBC: 396 ug/dL (ref 250–450)
UIBC: 329 ug/dL

## 2016-01-30 LAB — FERRITIN: Ferritin: 791 ng/mL — ABNORMAL HIGH (ref 24–336)

## 2016-01-30 LAB — POCT HEMOGLOBIN-HEMACUE: Hemoglobin: 10.5 g/dL — ABNORMAL LOW (ref 13.0–17.0)

## 2016-01-30 MED ORDER — EPOETIN ALFA 20000 UNIT/ML IJ SOLN
20000.0000 [IU] | INTRAMUSCULAR | Status: DC
  Administered 2016-01-30: 20000 [IU] via SUBCUTANEOUS

## 2016-01-30 MED ORDER — EPOETIN ALFA 20000 UNIT/ML IJ SOLN
INTRAMUSCULAR | Status: AC
  Filled 2016-01-30: qty 1

## 2016-02-24 ENCOUNTER — Other Ambulatory Visit (HOSPITAL_COMMUNITY): Payer: Self-pay

## 2016-02-27 ENCOUNTER — Ambulatory Visit (HOSPITAL_COMMUNITY)
Admission: RE | Admit: 2016-02-27 | Discharge: 2016-02-27 | Disposition: A | Discharge status: Home / Self Care | Payer: Medicare Other | Source: Ambulatory Visit | Attending: Nephrology | Admitting: Nephrology

## 2016-02-27 DIAGNOSIS — D509 Iron deficiency anemia, unspecified: Secondary | ICD-10-CM | POA: Insufficient documentation

## 2016-02-27 DIAGNOSIS — D631 Anemia in chronic kidney disease: Secondary | ICD-10-CM | POA: Insufficient documentation

## 2016-02-27 DIAGNOSIS — Z79899 Other long term (current) drug therapy: Secondary | ICD-10-CM | POA: Diagnosis not present

## 2016-02-27 DIAGNOSIS — N184 Chronic kidney disease, stage 4 (severe): Secondary | ICD-10-CM | POA: Diagnosis not present

## 2016-02-27 DIAGNOSIS — Z5181 Encounter for therapeutic drug level monitoring: Secondary | ICD-10-CM | POA: Diagnosis not present

## 2016-02-27 LAB — POCT HEMOGLOBIN-HEMACUE: Hemoglobin: 10 g/dL — ABNORMAL LOW (ref 13.0–17.0)

## 2016-02-27 MED ORDER — EPOETIN ALFA 20000 UNIT/ML IJ SOLN
20000.0000 [IU] | INTRAMUSCULAR | Status: DC
  Administered 2016-02-27: 20000 [IU] via SUBCUTANEOUS

## 2016-02-27 MED ORDER — SODIUM CHLORIDE 0.9 % IV SOLN
510.0000 mg | Freq: Once | INTRAVENOUS | Status: AC
  Administered 2016-02-27: 510 mg via INTRAVENOUS
  Filled 2016-02-27: qty 17

## 2016-02-27 MED ORDER — EPOETIN ALFA 20000 UNIT/ML IJ SOLN
INTRAMUSCULAR | Status: AC
  Filled 2016-02-27: qty 1

## 2016-03-26 ENCOUNTER — Encounter (HOSPITAL_COMMUNITY)
Admission: RE | Admit: 2016-03-26 | Discharge: 2016-03-26 | Disposition: A | Discharge status: Home / Self Care | Payer: Medicare Other | Source: Ambulatory Visit | Attending: Nephrology | Admitting: Nephrology

## 2016-03-26 DIAGNOSIS — N184 Chronic kidney disease, stage 4 (severe): Secondary | ICD-10-CM

## 2016-03-26 DIAGNOSIS — D631 Anemia in chronic kidney disease: Secondary | ICD-10-CM | POA: Diagnosis not present

## 2016-03-26 MED ORDER — EPOETIN ALFA 20000 UNIT/ML IJ SOLN
20000.0000 [IU] | INTRAMUSCULAR | Status: DC
  Administered 2016-03-26: 20000 [IU] via SUBCUTANEOUS

## 2016-03-26 MED ORDER — EPOETIN ALFA 20000 UNIT/ML IJ SOLN
INTRAMUSCULAR | Status: AC
  Filled 2016-03-26: qty 1

## 2016-03-27 LAB — POCT HEMOGLOBIN-HEMACUE: Hemoglobin: 10.6 g/dL — ABNORMAL LOW (ref 13.0–17.0)

## 2016-04-23 ENCOUNTER — Encounter (HOSPITAL_COMMUNITY)
Admission: RE | Admit: 2016-04-23 | Discharge: 2016-04-23 | Disposition: A | Discharge status: Home / Self Care | Payer: Medicare Other | Source: Ambulatory Visit | Attending: Nephrology | Admitting: Nephrology

## 2016-04-23 DIAGNOSIS — D631 Anemia in chronic kidney disease: Secondary | ICD-10-CM | POA: Diagnosis not present

## 2016-04-23 DIAGNOSIS — N184 Chronic kidney disease, stage 4 (severe): Secondary | ICD-10-CM | POA: Insufficient documentation

## 2016-04-23 LAB — IRON AND TIBC
IRON: 72 ug/dL (ref 45–182)
SATURATION RATIOS: 18 % (ref 17.9–39.5)
TIBC: 395 ug/dL (ref 250–450)
UIBC: 323 ug/dL

## 2016-04-23 LAB — FERRITIN: FERRITIN: 1104 ng/mL — AB (ref 24–336)

## 2016-04-23 LAB — POCT HEMOGLOBIN-HEMACUE: Hemoglobin: 10.3 g/dL — ABNORMAL LOW (ref 13.0–17.0)

## 2016-04-23 MED ORDER — EPOETIN ALFA 20000 UNIT/ML IJ SOLN
20000.0000 [IU] | INTRAMUSCULAR | Status: DC
  Administered 2016-04-23: 20000 [IU] via SUBCUTANEOUS

## 2016-04-23 MED ORDER — EPOETIN ALFA 20000 UNIT/ML IJ SOLN
INTRAMUSCULAR | Status: AC
Start: 2016-04-23 — End: 2016-04-23
  Filled 2016-04-23: qty 1

## 2016-05-21 ENCOUNTER — Encounter (HOSPITAL_COMMUNITY)
Admission: RE | Admit: 2016-05-21 | Discharge: 2016-05-21 | Disposition: A | Discharge status: Home / Self Care | Payer: Medicare Other | Source: Ambulatory Visit | Attending: Nephrology | Admitting: Nephrology

## 2016-05-21 DIAGNOSIS — N184 Chronic kidney disease, stage 4 (severe): Secondary | ICD-10-CM | POA: Insufficient documentation

## 2016-05-21 DIAGNOSIS — D631 Anemia in chronic kidney disease: Secondary | ICD-10-CM | POA: Diagnosis not present

## 2016-05-21 LAB — POCT HEMOGLOBIN-HEMACUE: Hemoglobin: 10.4 g/dL — ABNORMAL LOW (ref 13.0–17.0)

## 2016-05-21 MED ORDER — EPOETIN ALFA 20000 UNIT/ML IJ SOLN
20000.0000 [IU] | INTRAMUSCULAR | Status: DC
  Administered 2016-05-21: 20000 [IU] via SUBCUTANEOUS

## 2016-05-21 MED ORDER — EPOETIN ALFA 20000 UNIT/ML IJ SOLN
INTRAMUSCULAR | Status: AC
  Filled 2016-05-21: qty 1

## 2016-06-04 ENCOUNTER — Encounter (HOSPITAL_COMMUNITY): Payer: Self-pay | Admitting: Emergency Medicine

## 2016-06-04 ENCOUNTER — Inpatient Hospital Stay (HOSPITAL_COMMUNITY)
Admission: EM | Admit: 2016-06-04 | Discharge: 2016-06-09 | DRG: 313 | Disposition: A | Discharge status: Home / Self Care | Payer: Medicare Other | Attending: Internal Medicine | Admitting: Internal Medicine

## 2016-06-04 DIAGNOSIS — E785 Hyperlipidemia, unspecified: Secondary | ICD-10-CM | POA: Diagnosis present

## 2016-06-04 DIAGNOSIS — I495 Sick sinus syndrome: Secondary | ICD-10-CM | POA: Diagnosis present

## 2016-06-04 DIAGNOSIS — I120 Hypertensive chronic kidney disease with stage 5 chronic kidney disease or end stage renal disease: Secondary | ICD-10-CM | POA: Diagnosis present

## 2016-06-04 DIAGNOSIS — Z887 Allergy status to serum and vaccine status: Secondary | ICD-10-CM

## 2016-06-04 DIAGNOSIS — F329 Major depressive disorder, single episode, unspecified: Secondary | ICD-10-CM | POA: Diagnosis present

## 2016-06-04 DIAGNOSIS — F418 Other specified anxiety disorders: Secondary | ICD-10-CM | POA: Diagnosis present

## 2016-06-04 DIAGNOSIS — Z7982 Long term (current) use of aspirin: Secondary | ICD-10-CM

## 2016-06-04 DIAGNOSIS — I10 Essential (primary) hypertension: Secondary | ICD-10-CM | POA: Diagnosis present

## 2016-06-04 DIAGNOSIS — Z8249 Family history of ischemic heart disease and other diseases of the circulatory system: Secondary | ICD-10-CM

## 2016-06-04 DIAGNOSIS — E876 Hypokalemia: Secondary | ICD-10-CM | POA: Diagnosis present

## 2016-06-04 DIAGNOSIS — Z8673 Personal history of transient ischemic attack (TIA), and cerebral infarction without residual deficits: Secondary | ICD-10-CM | POA: Diagnosis present

## 2016-06-04 DIAGNOSIS — Z79891 Long term (current) use of opiate analgesic: Secondary | ICD-10-CM

## 2016-06-04 DIAGNOSIS — Z8371 Family history of colonic polyps: Secondary | ICD-10-CM

## 2016-06-04 DIAGNOSIS — H269 Unspecified cataract: Secondary | ICD-10-CM | POA: Diagnosis present

## 2016-06-04 DIAGNOSIS — I480 Paroxysmal atrial fibrillation: Secondary | ICD-10-CM | POA: Diagnosis present

## 2016-06-04 DIAGNOSIS — Z882 Allergy status to sulfonamides status: Secondary | ICD-10-CM

## 2016-06-04 DIAGNOSIS — Z79899 Other long term (current) drug therapy: Secondary | ICD-10-CM

## 2016-06-04 DIAGNOSIS — R0789 Other chest pain: Secondary | ICD-10-CM | POA: Diagnosis not present

## 2016-06-04 DIAGNOSIS — Z87891 Personal history of nicotine dependence: Secondary | ICD-10-CM

## 2016-06-04 DIAGNOSIS — I440 Atrioventricular block, first degree: Secondary | ICD-10-CM | POA: Diagnosis present

## 2016-06-04 DIAGNOSIS — F419 Anxiety disorder, unspecified: Secondary | ICD-10-CM | POA: Diagnosis present

## 2016-06-04 DIAGNOSIS — D72829 Elevated white blood cell count, unspecified: Secondary | ICD-10-CM | POA: Diagnosis present

## 2016-06-04 DIAGNOSIS — M109 Gout, unspecified: Secondary | ICD-10-CM | POA: Diagnosis present

## 2016-06-04 DIAGNOSIS — Z880 Allergy status to penicillin: Secondary | ICD-10-CM

## 2016-06-04 DIAGNOSIS — N185 Chronic kidney disease, stage 5: Secondary | ICD-10-CM | POA: Diagnosis present

## 2016-06-04 DIAGNOSIS — I4892 Unspecified atrial flutter: Secondary | ICD-10-CM | POA: Diagnosis present

## 2016-06-04 DIAGNOSIS — I6529 Occlusion and stenosis of unspecified carotid artery: Secondary | ICD-10-CM | POA: Diagnosis present

## 2016-06-04 MED ORDER — MORPHINE SULFATE (PF) 4 MG/ML IV SOLN
4.0000 mg | Freq: Once | INTRAVENOUS | Status: AC
  Administered 2016-06-05: 4 mg via INTRAVENOUS
  Filled 2016-06-04: qty 1

## 2016-06-04 MED ORDER — NITROGLYCERIN 2 % TD OINT
1.0000 [in_us] | TOPICAL_OINTMENT | Freq: Once | TRANSDERMAL | Status: AC
  Administered 2016-06-05: 1 [in_us] via TOPICAL
  Filled 2016-06-04: qty 1

## 2016-06-04 NOTE — ED Triage Notes (Signed)
Pt BIB EMS from home. Reports unspecified substernal CP for 1 day. Poor historian per EMS. Possible carotid stent placement. Per EMS, felt hot to touch. Denies N/V/D, dizziness, etc. No recent falls or trauma.
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
## 2016-06-05 ENCOUNTER — Emergency Department (HOSPITAL_COMMUNITY): Payer: Medicare Other

## 2016-06-05 DIAGNOSIS — R079 Chest pain, unspecified: Secondary | ICD-10-CM | POA: Diagnosis not present

## 2016-06-05 DIAGNOSIS — I10 Essential (primary) hypertension: Secondary | ICD-10-CM | POA: Diagnosis not present

## 2016-06-05 DIAGNOSIS — E785 Hyperlipidemia, unspecified: Secondary | ICD-10-CM | POA: Diagnosis not present

## 2016-06-05 DIAGNOSIS — Z8673 Personal history of transient ischemic attack (TIA), and cerebral infarction without residual deficits: Secondary | ICD-10-CM | POA: Diagnosis present

## 2016-06-05 DIAGNOSIS — R072 Precordial pain: Secondary | ICD-10-CM

## 2016-06-05 DIAGNOSIS — I480 Paroxysmal atrial fibrillation: Secondary | ICD-10-CM | POA: Diagnosis not present

## 2016-06-05 DIAGNOSIS — R0789 Other chest pain: Secondary | ICD-10-CM | POA: Diagnosis present

## 2016-06-05 DIAGNOSIS — D72829 Elevated white blood cell count, unspecified: Secondary | ICD-10-CM | POA: Diagnosis present

## 2016-06-05 DIAGNOSIS — N185 Chronic kidney disease, stage 5: Secondary | ICD-10-CM | POA: Diagnosis not present

## 2016-06-05 DIAGNOSIS — F418 Other specified anxiety disorders: Secondary | ICD-10-CM | POA: Diagnosis present

## 2016-06-05 DIAGNOSIS — F329 Major depressive disorder, single episode, unspecified: Secondary | ICD-10-CM | POA: Diagnosis present

## 2016-06-05 LAB — BASIC METABOLIC PANEL
Anion gap: 13 (ref 5–15)
BUN: 69 mg/dL — ABNORMAL HIGH (ref 6–20)
CHLORIDE: 104 mmol/L (ref 101–111)
CO2: 22 mmol/L (ref 22–32)
CREATININE: 3.97 mg/dL — AB (ref 0.61–1.24)
Calcium: 8.6 mg/dL — ABNORMAL LOW (ref 8.9–10.3)
GFR calc non Af Amer: 14 mL/min — ABNORMAL LOW (ref >60)
GFR, EST AFRICAN AMERICAN: 16 mL/min — AB (ref >60)
Glucose, Bld: 130 mg/dL — ABNORMAL HIGH (ref 65–99)
Potassium: 4.1 mmol/L (ref 3.5–5.1)
Sodium: 139 mmol/L (ref 135–145)

## 2016-06-05 LAB — I-STAT TROPONIN, ED: Troponin i, poc: 0.02 ng/mL (ref 0.00–0.08)

## 2016-06-05 LAB — URINALYSIS, ROUTINE W REFLEX MICROSCOPIC
BILIRUBIN URINE: NEGATIVE
Glucose, UA: NEGATIVE mg/dL
KETONES UR: NEGATIVE mg/dL
Leukocytes, UA: NEGATIVE
Nitrite: NEGATIVE
PROTEIN: NEGATIVE mg/dL
Specific Gravity, Urine: 1.01 (ref 1.005–1.030)
pH: 5 (ref 5.0–8.0)

## 2016-06-05 LAB — RAPID URINE DRUG SCREEN, HOSP PERFORMED
Amphetamines: NOT DETECTED
BARBITURATES: NOT DETECTED
Benzodiazepines: NOT DETECTED
COCAINE: NOT DETECTED
Opiates: POSITIVE — AB
Tetrahydrocannabinol: NOT DETECTED

## 2016-06-05 LAB — LIPID PANEL
CHOL/HDL RATIO: 4.1 ratio
CHOLESTEROL: 150 mg/dL (ref 0–200)
HDL: 37 mg/dL — AB (ref >40)
LDL Cholesterol: 96 mg/dL (ref 0–99)
TRIGLYCERIDES: 83 mg/dL (ref <150)
VLDL: 17 mg/dL (ref 0–40)

## 2016-06-05 LAB — CBC
HCT: 31.2 % — ABNORMAL LOW (ref 39.0–52.0)
Hemoglobin: 10.2 g/dL — ABNORMAL LOW (ref 13.0–17.0)
MCH: 32.2 pg (ref 26.0–34.0)
MCHC: 32.7 g/dL (ref 30.0–36.0)
MCV: 98.4 fL (ref 78.0–100.0)
PLATELETS: 184 10*3/uL (ref 150–400)
RBC: 3.17 MIL/uL — AB (ref 4.22–5.81)
RDW: 15.9 % — ABNORMAL HIGH (ref 11.5–15.5)
WBC: 13.6 10*3/uL — ABNORMAL HIGH (ref 4.0–10.5)

## 2016-06-05 LAB — URINE MICROSCOPIC-ADD ON

## 2016-06-05 LAB — LACTIC ACID, PLASMA: LACTIC ACID, VENOUS: 0.6 mmol/L (ref 0.5–1.9)

## 2016-06-05 LAB — D-DIMER, QUANTITATIVE: D-Dimer, Quant: 0.38 ug/mL-FEU (ref 0.00–0.50)

## 2016-06-05 LAB — TROPONIN I
TROPONIN I: 0.04 ng/mL — AB (ref <0.03)
TROPONIN I: 0.05 ng/mL — AB (ref <0.03)

## 2016-06-05 IMAGING — CR DG CHEST 2V
2 series · 2 of 2 positions shown · non-contrast
Comparison: Chest radiograph [DATE]

CLINICAL DATA: Chest pain

EXAM:
CHEST  2 VIEW

[chest pa]
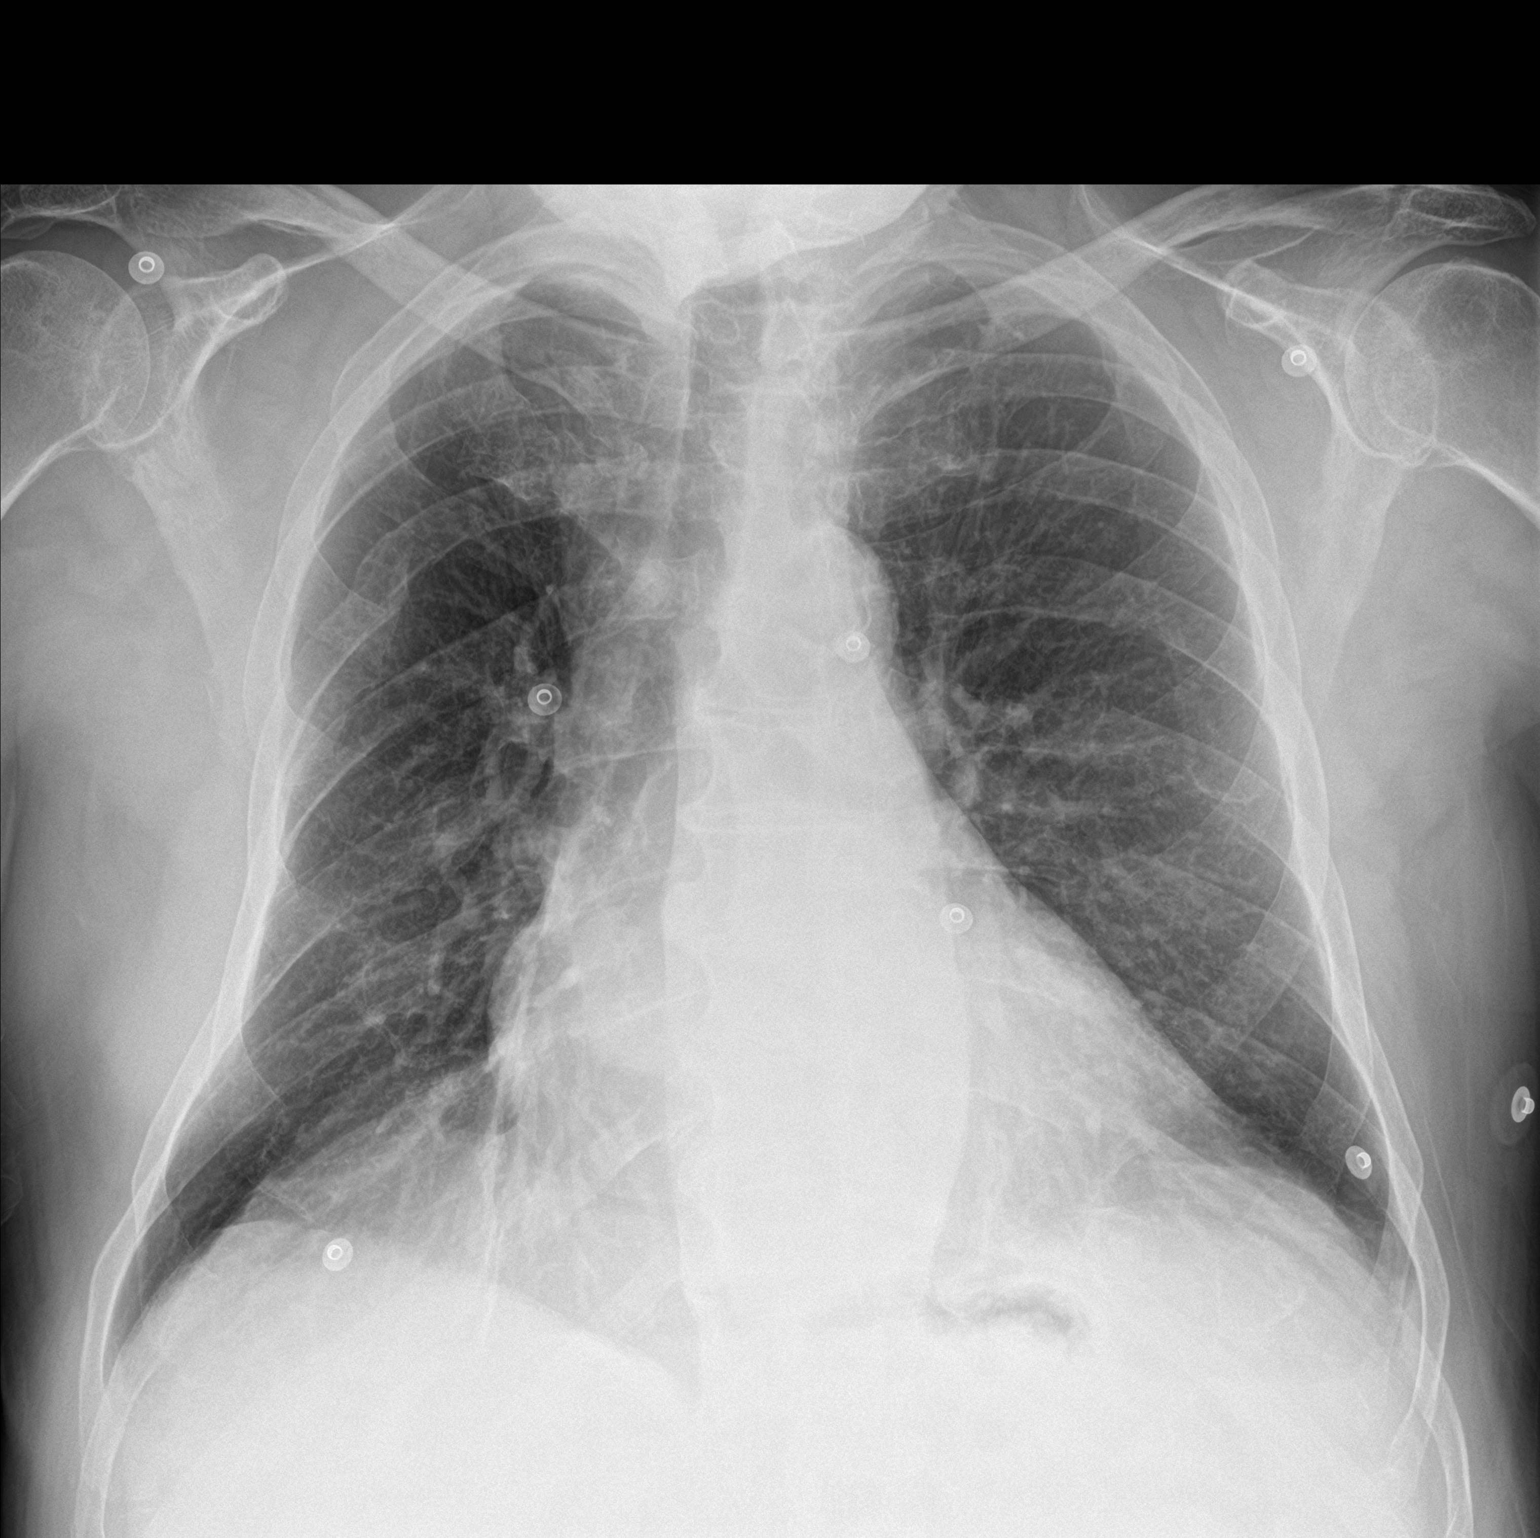

[chest lat]
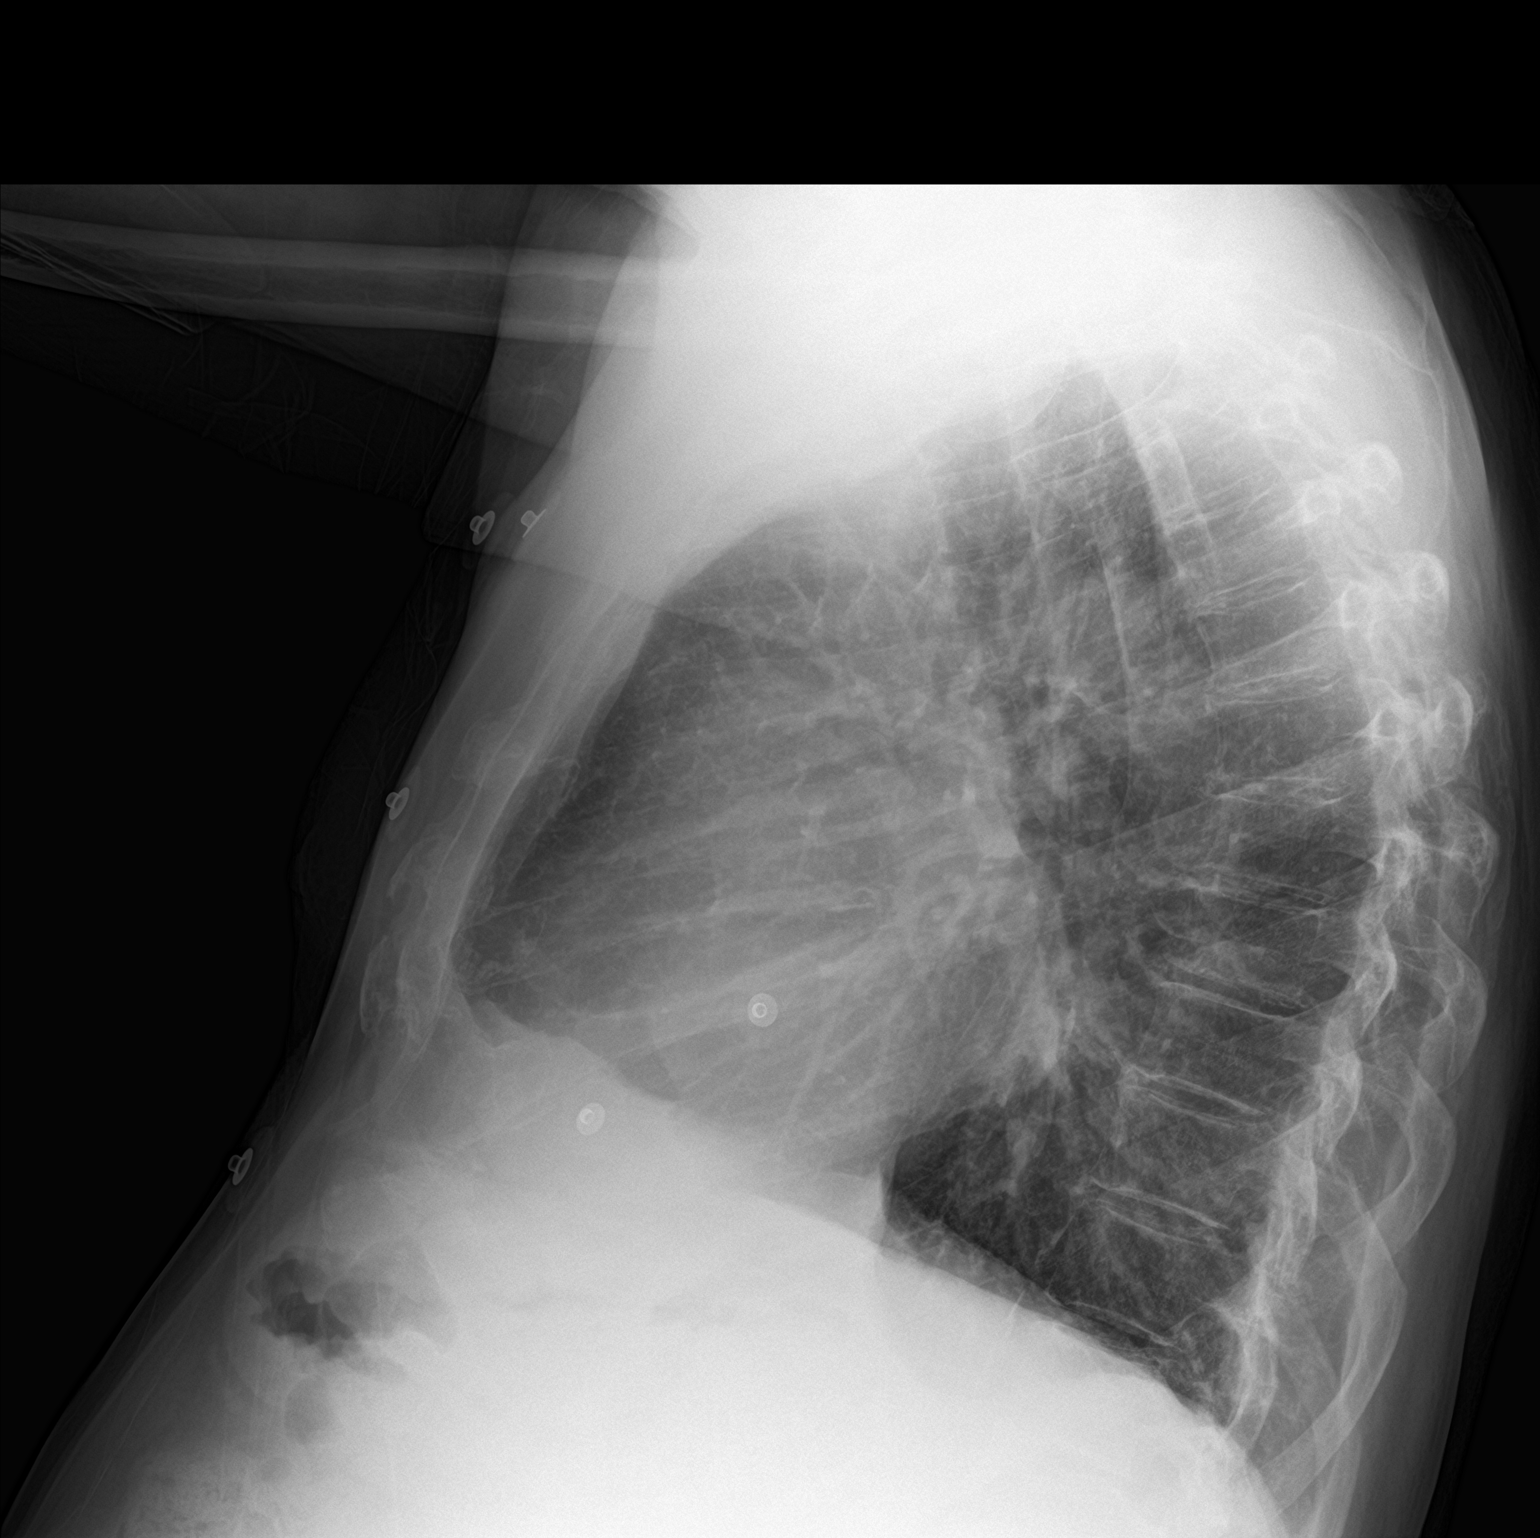

[2 of 2 positions shown; findings below may reference images not displayed]

FINDINGS: The cardiac silhouette is mildly enlarged. There is atherosclerotic
calcification in the aortic arch. No focal airspace consolidation or
pulmonary edema. No pneumothorax or pleural effusion.
IMPRESSION: 1. Mild cardiomegaly without overt pulmonary edema.
2. No focal airspace disease.

## 2016-06-05 MED ORDER — AMLODIPINE BESYLATE 10 MG PO TABS
10.0000 mg | ORAL_TABLET | Freq: Every day | ORAL | Status: DC
  Administered 2016-06-05 – 2016-06-08 (×4): 10 mg via ORAL
  Filled 2016-06-05 (×4): qty 1

## 2016-06-05 MED ORDER — ALPRAZOLAM 0.25 MG PO TABS: 0.2500 mg | ORAL_TABLET | Freq: Two times a day (BID) | ORAL | Status: DC | PRN

## 2016-06-05 MED ORDER — SERTRALINE HCL 100 MG PO TABS
100.0000 mg | ORAL_TABLET | Freq: Every day | ORAL | Status: DC
  Administered 2016-06-05 – 2016-06-09 (×5): 100 mg via ORAL
  Filled 2016-06-05 (×5): qty 1

## 2016-06-05 MED ORDER — CALCITRIOL 0.25 MCG PO CAPS
0.2500 ug | ORAL_CAPSULE | Freq: Every day | ORAL | Status: DC
  Administered 2016-06-05 – 2016-06-09 (×5): 0.25 ug via ORAL
  Filled 2016-06-05 (×5): qty 1

## 2016-06-05 MED ORDER — ZOLPIDEM TARTRATE 5 MG PO TABS: 5.0000 mg | ORAL_TABLET | Freq: Every evening | ORAL | Status: DC | PRN

## 2016-06-05 MED ORDER — FUROSEMIDE 80 MG PO TABS
80.0000 mg | ORAL_TABLET | Freq: Two times a day (BID) | ORAL | Status: DC
  Administered 2016-06-05 – 2016-06-09 (×9): 80 mg via ORAL
  Filled 2016-06-05 (×9): qty 1

## 2016-06-05 MED ORDER — ONDANSETRON HCL 4 MG/2ML IJ SOLN: 4.0000 mg | Freq: Four times a day (QID) | INTRAMUSCULAR | Status: DC | PRN

## 2016-06-05 MED ORDER — ALLOPURINOL 100 MG PO TABS
200.0000 mg | ORAL_TABLET | Freq: Every day | ORAL | Status: DC
  Administered 2016-06-05 – 2016-06-09 (×5): 200 mg via ORAL
  Filled 2016-06-05 (×5): qty 2

## 2016-06-05 MED ORDER — NITROGLYCERIN 0.4 MG SL SUBL: 0.4000 mg | SUBLINGUAL_TABLET | SUBLINGUAL | Status: DC | PRN

## 2016-06-05 MED ORDER — FENOFIBRATE 160 MG PO TABS
160.0000 mg | ORAL_TABLET | Freq: Every day | ORAL | Status: DC
  Administered 2016-06-05 – 2016-06-08 (×4): 160 mg via ORAL
  Filled 2016-06-05 (×4): qty 1

## 2016-06-05 MED ORDER — MORPHINE SULFATE (PF) 4 MG/ML IV SOLN: 2.0000 mg | INTRAVENOUS | Status: DC | PRN

## 2016-06-05 MED ORDER — ASPIRIN EC 325 MG PO TBEC
325.0000 mg | DELAYED_RELEASE_TABLET | Freq: Every day | ORAL | Status: DC
  Administered 2016-06-05 – 2016-06-06 (×2): 325 mg via ORAL
  Filled 2016-06-05 (×2): qty 1

## 2016-06-05 MED ORDER — LABETALOL HCL 200 MG PO TABS
300.0000 mg | ORAL_TABLET | Freq: Two times a day (BID) | ORAL | Status: DC
  Administered 2016-06-05 (×2): 300 mg via ORAL
  Filled 2016-06-05 (×3): qty 1

## 2016-06-05 MED ORDER — OXYCODONE HCL 5 MG PO TABS: 5.0000 mg | ORAL_TABLET | ORAL | Status: DC | PRN

## 2016-06-05 MED ORDER — ATORVASTATIN CALCIUM 40 MG PO TABS
40.0000 mg | ORAL_TABLET | Freq: Every day | ORAL | Status: DC
  Administered 2016-06-05 – 2016-06-08 (×4): 40 mg via ORAL
  Filled 2016-06-05 (×4): qty 1

## 2016-06-05 MED ORDER — HEPARIN SODIUM (PORCINE) 5000 UNIT/ML IJ SOLN
5000.0000 [IU] | Freq: Three times a day (TID) | INTRAMUSCULAR | Status: DC
  Administered 2016-06-05 – 2016-06-06 (×3): 5000 [IU] via SUBCUTANEOUS
  Filled 2016-06-05 (×3): qty 1

## 2016-06-05 MED ORDER — ACETAMINOPHEN 325 MG PO TABS
650.0000 mg | ORAL_TABLET | ORAL | Status: DC | PRN
Start: 2016-06-05 — End: 2016-06-09
  Administered 2016-06-07: 650 mg via ORAL

## 2016-06-05 MED ORDER — MORPHINE SULFATE (PF) 4 MG/ML IV SOLN: 4.0000 mg | Freq: Once | INTRAVENOUS | Status: DC

## 2016-06-05 MED ORDER — HYDRALAZINE HCL 20 MG/ML IJ SOLN: 5.0000 mg | INTRAMUSCULAR | Status: DC | PRN

## 2016-06-05 MED ORDER — FAMOTIDINE 20 MG PO TABS
20.0000 mg | ORAL_TABLET | Freq: Every day | ORAL | Status: DC
  Administered 2016-06-05 – 2016-06-09 (×5): 20 mg via ORAL
  Filled 2016-06-05 (×5): qty 1

## 2016-06-05 NOTE — Progress Notes (Signed)
Intermittent atrial tachycardia noted with sinus pause (2.4 and 2.6). Pt is asymptomatic. Reviewed telemetry with Chanetta Marshall. Will continue to monitor for now.

## 2016-06-05 NOTE — Progress Notes (Signed)
Pt resting in bed trop 0.4. PA on call paged. Will cont to monitor pt.

## 2016-06-05 NOTE — Care Management Obs Status (Signed)
[place] NOTIFICATION   Patient Details  Name: Troy [name] MRN: [id number] Date of Birth: Jan 24, 1941   Medicare Observation Status Notification Given:  Yes    Bethena Roys, RN 06/05/2016, 12:46 PM
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
## 2016-06-05 NOTE — Progress Notes (Signed)
Pt has had runs of AFIB and 2 pauses, 2.67 and 2.37. EKG obtained. Pt asymptomatic resting in bed. Cardiology PA paged. Will cont to monitor pt.

## 2016-06-05 NOTE — Progress Notes (Signed)
[place] OF CARE NOTE Patient: Troy [name] VYX:[id number]   PCP: Simona Huh, MD DOB: Jun 28, 1941   DOA: 06/04/2016   DOS: 06/05/2016    Patient was admitted by my colleague Dr. Blaine Hamper earlier on 06/05/2016. I have reviewed the H&P as well as assessment and plan and agree with the same. Important changes in the plan are listed below.  Plan of care: Principal Problem:   Chest pain Active Problems:   CKD (chronic kidney disease), stage V (HCC)   HTN (hypertension)   HLD (hyperlipidemia)   Stroke [facility])   Depression   Leukocytosis cardiology consulting, appreciate input, awaiting echocardiogram.  Pain appears atypical and pleuritic.    Author: Berle Mull, MD [facility] Pager: [phone] 06/05/2016 6:03 PM   If 7PM-7AM, please contact night-coverage at [email or web address], password [facility]
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
## 2016-06-06 ENCOUNTER — Observation Stay (HOSPITAL_BASED_OUTPATIENT_CLINIC_OR_DEPARTMENT_OTHER): Payer: Medicare Other

## 2016-06-06 DIAGNOSIS — I480 Paroxysmal atrial fibrillation: Secondary | ICD-10-CM | POA: Diagnosis not present

## 2016-06-06 DIAGNOSIS — D72829 Elevated white blood cell count, unspecified: Secondary | ICD-10-CM

## 2016-06-06 DIAGNOSIS — I10 Essential (primary) hypertension: Secondary | ICD-10-CM | POA: Diagnosis not present

## 2016-06-06 DIAGNOSIS — R079 Chest pain, unspecified: Secondary | ICD-10-CM

## 2016-06-06 DIAGNOSIS — N185 Chronic kidney disease, stage 5: Secondary | ICD-10-CM | POA: Diagnosis not present

## 2016-06-06 DIAGNOSIS — R609 Edema, unspecified: Secondary | ICD-10-CM | POA: Diagnosis not present

## 2016-06-06 DIAGNOSIS — R072 Precordial pain: Secondary | ICD-10-CM | POA: Diagnosis not present

## 2016-06-06 DIAGNOSIS — I639 Cerebral infarction, unspecified: Secondary | ICD-10-CM

## 2016-06-06 DIAGNOSIS — E785 Hyperlipidemia, unspecified: Secondary | ICD-10-CM | POA: Diagnosis not present

## 2016-06-06 LAB — COMPREHENSIVE METABOLIC PANEL
ALBUMIN: 3.1 g/dL — AB (ref 3.5–5.0)
ALT: 13 U/L — ABNORMAL LOW (ref 17–63)
AST: 17 U/L (ref 15–41)
Alkaline Phosphatase: 35 U/L — ABNORMAL LOW (ref 38–126)
Anion gap: 12 (ref 5–15)
BUN: 67 mg/dL — AB (ref 6–20)
CHLORIDE: 105 mmol/L (ref 101–111)
CO2: 24 mmol/L (ref 22–32)
Calcium: 8.7 mg/dL — ABNORMAL LOW (ref 8.9–10.3)
Creatinine, Ser: 3.91 mg/dL — ABNORMAL HIGH (ref 0.61–1.24)
GFR calc Af Amer: 16 mL/min — ABNORMAL LOW (ref >60)
GFR calc non Af Amer: 14 mL/min — ABNORMAL LOW (ref >60)
GLUCOSE: 105 mg/dL — AB (ref 65–99)
POTASSIUM: 3.6 mmol/L (ref 3.5–5.1)
SODIUM: 141 mmol/L (ref 135–145)
Total Bilirubin: 0.6 mg/dL (ref 0.3–1.2)
Total Protein: 6.3 g/dL — ABNORMAL LOW (ref 6.5–8.1)

## 2016-06-06 LAB — ECHOCARDIOGRAM COMPLETE
CHL CUP MV DEC (S): 250
EERAT: 21.49
EWDT: 250 ms
FS: 26 % — AB (ref 28–44)
Height: 68 in
IVS/LV PW RATIO, ED: 1.15
LA diam index: 1.9 cm/m2
LA vol index: 62.9 mL/m2
LASIZE: 39 mm
LAVOL: 129 mL
LAVOLA4C: 98.3 mL
LEFT ATRIUM END SYS DIAM: 39 mm
LV PW d: 12.4 mm — AB (ref 0.6–1.1)
LVEEAVG: 21.49
LVEEMED: 21.49
LVELAT: 8.05 cm/s
LVOT area: 4.91 cm2
LVOT diameter: 25 mm
MV Peak grad: 12 mmHg
MVPKAVEL: 84.7 m/s
MVPKEVEL: 173 m/s
Reg peak vel: 287 cm/s
TAPSE: 14.2 mm
TDI e' lateral: 8.05
TDI e' medial: 5.68
TRMAXVEL: 287 cm/s
Weight: 3033.6 oz

## 2016-06-06 LAB — URINE CULTURE: Culture: NO GROWTH

## 2016-06-06 LAB — CBC WITH DIFFERENTIAL/PLATELET
BASOS ABS: 0 10*3/uL (ref 0.0–0.1)
BASOS PCT: 0 %
Eosinophils Absolute: 0.2 10*3/uL (ref 0.0–0.7)
Eosinophils Relative: 2 %
HEMATOCRIT: 28.7 % — AB (ref 39.0–52.0)
Hemoglobin: 9.2 g/dL — ABNORMAL LOW (ref 13.0–17.0)
Lymphocytes Relative: 11 %
Lymphs Abs: 1.1 10*3/uL (ref 0.7–4.0)
MCH: 31.6 pg (ref 26.0–34.0)
MCHC: 32.1 g/dL (ref 30.0–36.0)
MCV: 98.6 fL (ref 78.0–100.0)
MONO ABS: 0.9 10*3/uL (ref 0.1–1.0)
Monocytes Relative: 9 %
NEUTROS ABS: 7.6 10*3/uL (ref 1.7–7.7)
Neutrophils Relative %: 78 %
PLATELETS: 167 10*3/uL (ref 150–400)
RBC: 2.91 MIL/uL — ABNORMAL LOW (ref 4.22–5.81)
RDW: 16.1 % — AB (ref 11.5–15.5)
WBC: 9.7 10*3/uL (ref 4.0–10.5)

## 2016-06-06 LAB — HEMOGLOBIN A1C
Hgb A1c MFr Bld: 5 % (ref 4.8–5.6)
Mean Plasma Glucose: 97 mg/dL

## 2016-06-06 MED ORDER — APIXABAN 5 MG PO TABS
5.0000 mg | ORAL_TABLET | Freq: Two times a day (BID) | ORAL | Status: DC
  Administered 2016-06-06 – 2016-06-09 (×6): 5 mg via ORAL
  Filled 2016-06-06 (×6): qty 1

## 2016-06-06 MED ORDER — AMIODARONE HCL 200 MG PO TABS
200.0000 mg | ORAL_TABLET | Freq: Two times a day (BID) | ORAL | Status: DC
  Administered 2016-06-06 – 2016-06-07 (×3): 200 mg via ORAL
  Filled 2016-06-06 (×3): qty 1

## 2016-06-06 MED ORDER — LABETALOL HCL 100 MG PO TABS
100.0000 mg | ORAL_TABLET | Freq: Two times a day (BID) | ORAL | Status: DC
  Administered 2016-06-06 – 2016-06-09 (×7): 100 mg via ORAL
  Filled 2016-06-06 (×6): qty 1

## 2016-06-06 MED ORDER — HYDRALAZINE HCL 25 MG PO TABS
25.0000 mg | ORAL_TABLET | Freq: Three times a day (TID) | ORAL | Status: DC
  Administered 2016-06-06 – 2016-06-07 (×3): 25 mg via ORAL
  Filled 2016-06-06 (×3): qty 1

## 2016-06-06 NOTE — Progress Notes (Signed)
PROGRESS NOTE    Troy [name]  ATF:[id number] DOB: [birth year] DOA: 06/04/2016 PCP: Simona Huh, MD   Chief Complaint  Patient presents with  . Chest Pain    Brief Narrative:  HPI on 06/05/2016 by Dr. Ivor Costa MCCABE Troy [name] is a 75 y.o. 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 Patient states that he started having chest pain at about 10 PM. It is located in the substernal area, 8 out of 10 in severity, dull, notradiating. It is aggravated by deep breath. No calf tenderness. Patient denies cough or shortness of breath. He does not have subjective fever or chills, but his temperature is 100.2 in ED.  Patient denies nausea, vomiting, diarrhea, abdominal pain. No symptoms of UTI. No unilateral weakness.   Assessment & Plan   Chest pain -D-dimer negative, less likely to have PE.  -Chest x-ray negative for pneumonia.  -Patient has significant risk factors including hypertension, hyperlipidemia, old age, stroke and CKD-V -Troponins cycled: peaked at 0.05 -EKG showed subtle ST elevations in inferior lateral leads -Cardiology consulted and appreciated, felt this to be due to pericarditis, recommended tylenol as patient cannot take NSAIDS due to CKD -Echocardiogram pending  Sinus arrhythmia  -Patient noted to have Afib and pauses  -?tachy-brady syndrome -CHADSVASC 5 -Eliquis started today by cardiology -Continue amiodarone -Pending echocardiogram/TSH  CKD (chronic kidney disease), stage V ([place])  -Creatinine 3.97, which was a 4.45 on 01/31/15, at baseline. S/p of AVF in left arm. No fluid overload. -Continue home dose Lasix, 80 mg twice a day -f/u renal fx by BMP  Hyperlipidemia -lipid panel: TC 150, TG 83, HDL 37, LDL 96 -Continue statin and fenofibrate   Essential hypertension -Continue labetalol (weaning),  hydralazine, lasix  Hx of Stroke (HCC) and carotid artery stenosis -continue statin -Aspirin discontinued, Eliquis started  Depression and anxiety -Continue zoloft, Xanax PRN  Leukocytosis -Resolved -Patient did have mild fever with temperature 100.2-->99.  -No source of infection identified. CXR and UA unremarkable   DVT Prophylaxis  Eliquis  Code Status: Full  Family Communication: None at bedside  Disposition Plan: Observation   Consultants Cardiology   Procedures  None  Antibiotics   Anti-infectives    None      Subjective:   [name] seen and examined today.  Patient feels better today. Denies chest pain, Shortness of breath, abdominal pain, nausea or vomiting, diarrhea or constipation. Denies feeling any palpitations.  Objective:   Vitals:   06/05/16 1358 06/05/16 1813 06/05/16 2018 06/06/16 0500  BP: (!) 133/52 (!) 144/57 (!) 152/61 (!) 144/64  Pulse: 62 64 68 82  Resp: 18 12 (!) 24 19  Temp: 98 F (36.7 C)  99.8 F (37.7 C) 99.7 F (37.6 C)  TempSrc: Oral  Oral Oral  SpO2: 96% 95% 96% 94%  Weight:    86 kg (189 lb 9.6 oz)  Height:        Intake/Output Summary (Last 24 hours) at 06/06/16 1237 Last data filed at 06/06/16 1024  Gross per 24 hour  Intake              922 ml  Output             1025 ml  Net             -103 ml   [employer]  06/05/16 0650 06/05/16 0723 06/06/16 0500  Weight: 86.7 kg (191 lb 1.6 oz) 86.7 kg (191 lb 1.6 oz) 86 kg (189 lb 9.6 oz)    Exam  General: Well developed, well nourished, NAD, appears stated age  18: NCAT,  mucous membranes moist.   Cardiovascular: S1 S2 auscultated, irregular  Respiratory: Clear to auscultation bilaterally with equal chest rise  Abdomen: Soft, nontender, nondistended, + bowel sounds  Extremities: warm dry without cyanosis clubbing or edema  Neuro: AAOx3, nonfocal  Psych: Normal affect and demeanor with intact judgement and insight   Data Reviewed: I have  personally reviewed following labs and imaging studies  CBC:  Recent Labs Lab 06/04/16 2354 06/06/16 0525  WBC 13.6* 9.7  NEUTROABS  --  7.6  HGB 10.2* 9.2*  HCT 31.2* 28.7*  MCV 98.4 98.6  PLT 184 381   Basic Metabolic Panel:  Recent Labs Lab 06/04/16 2354 06/06/16 0525  NA 139 141  K 4.1 3.6  CL 104 105  CO2 22 24  GLUCOSE 130* 105*  BUN 69* 67*  CREATININE 3.97* 3.91*  CALCIUM 8.6* 8.7*   GFR: Estimated Creatinine Clearance: 17.4 mL/min (by C-G formula based on SCr of 3.91 mg/dL (H)). Liver Function Tests:  Recent Labs Lab 06/06/16 0525  AST 17  ALT 13*  ALKPHOS 35*  BILITOT 0.6  PROT 6.3*  ALBUMIN 3.1*   No results for input(s): LIPASE, AMYLASE in the last 168 hours. No results for input(s): AMMONIA in the last 168 hours. Coagulation Profile: No results for input(s): INR, PROTIME in the last 168 hours. Cardiac Enzymes:  Recent Labs Lab 06/05/16 0337 06/05/16 0906 06/05/16 1458  TROPONINI <0.03 0.04* 0.05*   BNP (last 3 results) No results for input(s): PROBNP in the last 8760 hours. HbA1C:  Recent Labs  06/05/16 0337  HGBA1C 5.0   CBG: No results for input(s): GLUCAP in the last 168 hours. Lipid Profile:  Recent Labs  06/05/16 0337  CHOL 150  HDL 37*  LDLCALC 96  TRIG 83  CHOLHDL 4.1   Thyroid Function Tests: No results for input(s): TSH, T4TOTAL, FREET4, T3FREE, THYROIDAB in the last 72 hours. Anemia Panel: No results for input(s): VITAMINB12, FOLATE, FERRITIN, TIBC, IRON, RETICCTPCT in the last 72 hours. Urine analysis:    Component Value Date/Time   COLORURINE YELLOW 06/05/2016 0328   APPEARANCEUR CLEAR 06/05/2016 0328   LABSPEC 1.010 06/05/2016 0328   PHURINE 5.0 06/05/2016 0328   GLUCOSEU NEGATIVE 06/05/2016 0328   HGBUR TRACE (A) 06/05/2016 0328   BILIRUBINUR NEGATIVE 06/05/2016 0328   KETONESUR NEGATIVE 06/05/2016 0328   PROTEINUR NEGATIVE 06/05/2016 0328   UROBILINOGEN 0.2 02/15/2010 0845   NITRITE NEGATIVE  06/05/2016 0328   LEUKOCYTESUR NEGATIVE 06/05/2016 0328   Sepsis Labs: @LABRCNTIP (procalcitonin:4,lacticidven:4)  ) Recent Results (from the past 240 hour(s))  Urine culture     Status: None   Collection Time: 06/05/16  3:28 AM  Result Value Ref Range Status   Specimen Description URINE, RANDOM  Final   Special Requests NONE  Final   Culture NO GROWTH  Final   Report Status 06/06/2016 FINAL  Final      Radiology Studies: Dg Chest 2 View  Result Date: 06/05/2016 CLINICAL DATA:  Chest pain EXAM: CHEST  2 VIEW COMPARISON:  Chest radiograph 10/14/2011 FINDINGS: The cardiac silhouette is mildly enlarged. There is atherosclerotic calcification in the aortic arch. No focal airspace consolidation or pulmonary edema. No pneumothorax or pleural effusion. IMPRESSION: 1. Mild cardiomegaly without overt pulmonary  edema. 2. No focal airspace disease. Electronically Signed   By: Ulyses Jarred M.D.   On: 06/05/2016 00:50     Scheduled Meds: . allopurinol  200 mg Oral Daily  . amiodarone  200 mg Oral BID  . amLODipine  10 mg Oral QHS  . apixaban  5 mg Oral BID  . atorvastatin  40 mg Oral q1800  . calcitRIOL  0.25 mcg Oral Daily  . famotidine  20 mg Oral Daily  . fenofibrate  160 mg Oral QHS  . furosemide  80 mg Oral BID  . hydrALAZINE  25 mg Oral Q8H  . labetalol  100 mg Oral BID  . sertraline  100 mg Oral Daily   Continuous Infusions:   LOS: 0 days   Time Spent in minutes   30 minutes  Elver Stadler D.O. on 06/06/2016 at 12:37 PM  Between 7am to 7pm - Pager - [phone]  After 7pm go to [email or web address] - password [email or web address]  And look for the night coverage person covering for me after hours  [facility] Office  [phone]

## 2016-06-06 NOTE — Progress Notes (Signed)
*  PRELIMINARY RESULTS* Vascular Ultrasound BIlateral lower extremity venous duplex has been completed.  Preliminary findings: No evidence of deep vein thrombosis or baker's cysts bilaterally.    Everrett Coombe 06/06/2016, 11:15 AM

## 2016-06-06 NOTE — Progress Notes (Signed)
Pt had a 4.04 second pause at 0613. Pt asymptomatic. Paged on call cards and was told that they would take a look at strip and come see him on rounds. Received orders to make patient NPO until rounds. Will continue to monitor.   Troy [name] E

## 2016-06-06 NOTE — Progress Notes (Signed)
Patient Name: Troy [name] Date of Encounter: 06/06/2016  Primary Cardiologist: New to Dr. Talmadge Chad Problem List     Principal Problem:   Chest pain Active Problems:   CKD (chronic kidney disease), stage V (HCC)   HTN (hypertension)   HLD (hyperlipidemia)   Stroke (HCC)   Depression   Leukocytosis  Subjective   Feeling well. No chest pain, sob or palpitations.   Inpatient Medications    Scheduled Meds: . allopurinol  200 mg Oral Daily  . amLODipine  10 mg Oral QHS  . aspirin EC  325 mg Oral Daily  . atorvastatin  40 mg Oral q1800  . calcitRIOL  0.25 mcg Oral Daily  . famotidine  20 mg Oral Daily  . fenofibrate  160 mg Oral QHS  . furosemide  80 mg Oral BID  . heparin  5,000 Units Subcutaneous Q8H  . labetalol  300 mg Oral BID  . sertraline  100 mg Oral Daily   Continuous Infusions:  PRN Meds: acetaminophen, ALPRAZolam, hydrALAZINE, morphine injection, nitroGLYCERIN, ondansetron (ZOFRAN) IV, zolpidem   Vital Signs    Vitals:   06/05/16 1358 06/05/16 1813 06/05/16 2018 06/06/16 0500  BP: (!) 133/52 (!) 144/57 (!) 152/61 (!) 144/64  Pulse: 62 64 68 82  Resp: 18 12 (!) 24 19  Temp: 98 F (36.7 C)  99.8 F (37.7 C) 99.7 F (37.6 C)  TempSrc: Oral  Oral Oral  SpO2: 96% 95% 96% 94%  Weight:    189 lb 9.6 oz (86 kg)  Height:        Intake/Output Summary (Last 24 hours) at 06/06/16 0800 Last data filed at 06/05/16 2100  Gross per 24 hour  Intake              900 ml  Output             1250 ml  Net             -350 ml   Filed Weights   06/05/16 0650 06/05/16 0723 06/06/16 0500  Weight: 191 lb 1.6 oz (86.7 kg) 191 lb 1.6 oz (86.7 kg) 189 lb 9.6 oz (86 kg)    Physical Exam    GEN: Well nourished, well developed, in no acute distress.  HEENT: Grossly normal.  Neck: Supple, no JVD, carotid bruits, or masses. Cardiac: Ir IR , no murmurs, rubs, or gallops. No clubbing, cyanosis, edema.  Radials/DP/PT 2+ and equal bilaterally.  Respiratory:   Respirations regular and unlabored, clear to auscultation bilaterally. GI: Soft, nontender, nondistended, BS + x 4. MS: no deformity or atrophy. Skin: warm and dry, no rash. Neuro:  Strength and sensation are intact. Psych: AAOx3.  Normal affect.  Labs    CBC  Recent Labs  06/04/16 2354 06/06/16 0525  WBC 13.6* 9.7  NEUTROABS  --  7.6  HGB 10.2* 9.2*  HCT 31.2* 28.7*  MCV 98.4 98.6  PLT 184 428   Basic Metabolic Panel  Recent Labs  06/04/16 2354 06/06/16 0525  NA 139 141  K 4.1 3.6  CL 104 105  CO2 22 24  GLUCOSE 130* 105*  BUN 69* 67*  CREATININE 3.97* 3.91*  CALCIUM 8.6* 8.7*   Liver Function Tests  Recent Labs  06/06/16 0525  AST 17  ALT 13*  ALKPHOS 35*  BILITOT 0.6  PROT 6.3*  ALBUMIN 3.1*   No results for input(s): LIPASE, AMYLASE in the last 72 hours. Cardiac Enzymes  Recent Labs  06/05/16  4268 06/05/16 0906 06/05/16 1458  TROPONINI <0.03 0.04* 0.05*   BNP Invalid input(s): POCBNP D-Dimer  Recent Labs  06/05/16 0125  DDIMER 0.38   Hemoglobin A1C  Recent Labs  06/05/16 0337  HGBA1C 5.0   Fasting Lipid Panel  Recent Labs  06/05/16 0337  CHOL 150  HDL 37*  LDLCALC 96  TRIG 83  CHOLHDL 4.1   Thyroid Function Tests No results for input(s): TSH, T4TOTAL, T3FREE, THYROIDAB in the last 72 hours.  Invalid input(s): FREET3  Telemetry    afib with tachybrady and pauses, longest 4.44sec - Personally Reviewed  ECG    Sinus rhythm with 1st degree AV block - Personally Reviewed  Radiology    Dg Chest 2 View  Result Date: 06/05/2016 CLINICAL DATA:  Chest pain EXAM: CHEST  2 VIEW COMPARISON:  Chest radiograph 10/14/2011 FINDINGS: The cardiac silhouette is mildly enlarged. There is atherosclerotic calcification in the aortic arch. No focal airspace consolidation or pulmonary edema. No pneumothorax or pleural effusion. IMPRESSION: 1. Mild cardiomegaly without overt pulmonary edema. 2. No focal airspace disease. Electronically  Signed   By: Ulyses Jarred M.D.   On: 06/05/2016 00:50    Cardiac Studies   Pending echo, LE doppler  Patient Profile     Troy [name] is a 75 y.o. 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   Assessment & Plan    1.. Chest pain - Atypical. Now resolved. Troponin trend <0.03-->0.04-->0.05. EKG showed  SR, PVC and subtle ST elevation in the inferior lateral leads, first-degree AV block. Likely possible pericarditis. Unable to add NSAID due to renal insufficiency. Treated with ASA. He is chronically on allopurinol. Pending echo.   2. Sinus arrhythmia - Patient is in afib currently. Few strip also seems like atrial tachycardia vs aflutter with rate high in 120-130s. . Also has few pause > 3 seconds, longest 4.44sec @ 6.41am. He was laying in bed. Asymptomatic. Will hold BB. Concerning for tachy-brady. ? Need of pacemaker. Will review with MD. Keep NPO.   3. HLD - 06/05/2016: Cholesterol 150; HDL 37; LDL Cholesterol 96; Triglycerides 83; VLDL 17  - Continue statin  4. HTN - BP of 160/69 at presentation. Now stable. Continue norvasc. Hold labetalol 300mg  BID as above.   5. Carotid artery disease s/p L ECA 2011 - Last carotid doppler 09/2014 showed minimal stenosis of the right ICA and widely patent left carotid endarterectomy without evidence of restenosis or hyperplasia. He is due for study. F/u with Dr. Scot Dock.   6. CKD (chronic kidney disease), stage V (HCC) - s/p L arm AVF. Scr stable.   Signed, Leanor Kail, PA  06/06/2016, 8:00 AM   As above, patient seen and examined. Patient denies chest pain, dyspnea, palpitations or syncope. He is having intermittent atrial fibrillation on telemetry. No symptoms reported. He is also having posttermination pauses longest of 4.2 seconds. CHADSvasc 5. DC ASA. Add apixaban 5 BID. Check echocardiogram. Check TSH.  Difficult situation as he is showing evidence of tachybradycardia syndrome. I would like to avoid pacemaker if possible. We will begin to wean labetalol off (decreased 100 twice a day for 2 days and then discontinue). Add hydralazine 25 mg by mouth 3 times a day and increase as needed. Add amiodarone 200 mg twice a day to hopefully maintain sinus rhythm. Follow telemetry closely for any prolonged pauses.  Kirk Ruths, MD

## 2016-06-06 NOTE — Discharge Instructions (Signed)
Information on my medicine - ELIQUIS® (apixaban) ° °This medication education was reviewed with me or my healthcare representative as part of my discharge preparation.  The pharmacist that spoke with me during my hospital stay was:  Jawanda Passey Kay, RPH ° °Why was Eliquis® prescribed for you? °Eliquis® was prescribed for you to reduce the risk of forming blood clots that can cause a stroke if you have a medical condition called atrial fibrillation (a type of irregular heartbeat) OR to reduce the risk of a blood clots forming after orthopedic surgery. ° °What do You need to know about Eliquis® ? °Take your Eliquis® TWICE DAILY - one tablet in the morning and one tablet in the evening with or without food.  It would be best to take the doses about the same time each day. ° °If you have difficulty swallowing the tablet whole please discuss with your pharmacist how to take the medication safely. ° °Take Eliquis® exactly as prescribed by your doctor and DO NOT stop taking Eliquis® without talking to the doctor who prescribed the medication.  Stopping may increase your risk of developing a new clot or stroke.  Refill your prescription before you run out. ° °After discharge, you should have regular check-up appointments with your healthcare provider that is prescribing your Eliquis®.  In the future your dose may need to be changed if your kidney function or weight changes by a significant amount or as you get older. ° °What do you do if you miss a dose? °If you miss a dose, take it as soon as you remember on the same day and resume taking twice daily.  Do not take more than one dose of ELIQUIS at the same time. ° °Important Safety Information °A possible side effect of Eliquis® is bleeding. You should call your healthcare provider right away if you experience any of the following: °? Bleeding from an injury or your nose that does not stop. °? Unusual colored urine (red or dark brown) or unusual colored stools (red or  black). °? Unusual bruising for unknown reasons. °? A serious fall or if you hit your head (even if there is no bleeding). ° °Some medicines may interact with Eliquis® and might increase your risk of bleeding or clotting while on Eliquis®. To help avoid this, consult your healthcare provider or pharmacist prior to using any new prescription or non-prescription medications, including herbals, vitamins, non-steroidal anti-inflammatory drugs (NSAIDs) and supplements. ° °This website has more information on Eliquis® (apixaban): [email or web address].com. ° ° °

## 2016-06-07 DIAGNOSIS — I6529 Occlusion and stenosis of unspecified carotid artery: Secondary | ICD-10-CM | POA: Diagnosis present

## 2016-06-07 DIAGNOSIS — Z8371 Family history of colonic polyps: Secondary | ICD-10-CM | POA: Diagnosis not present

## 2016-06-07 DIAGNOSIS — I120 Hypertensive chronic kidney disease with stage 5 chronic kidney disease or end stage renal disease: Secondary | ICD-10-CM | POA: Diagnosis present

## 2016-06-07 DIAGNOSIS — I480 Paroxysmal atrial fibrillation: Secondary | ICD-10-CM | POA: Diagnosis present

## 2016-06-07 DIAGNOSIS — R0789 Other chest pain: Secondary | ICD-10-CM | POA: Diagnosis present

## 2016-06-07 DIAGNOSIS — E785 Hyperlipidemia, unspecified: Secondary | ICD-10-CM | POA: Diagnosis present

## 2016-06-07 DIAGNOSIS — E876 Hypokalemia: Secondary | ICD-10-CM | POA: Diagnosis present

## 2016-06-07 DIAGNOSIS — I495 Sick sinus syndrome: Secondary | ICD-10-CM | POA: Diagnosis present

## 2016-06-07 DIAGNOSIS — M109 Gout, unspecified: Secondary | ICD-10-CM | POA: Diagnosis present

## 2016-06-07 DIAGNOSIS — I440 Atrioventricular block, first degree: Secondary | ICD-10-CM | POA: Diagnosis present

## 2016-06-07 DIAGNOSIS — Z87891 Personal history of nicotine dependence: Secondary | ICD-10-CM | POA: Diagnosis not present

## 2016-06-07 DIAGNOSIS — N185 Chronic kidney disease, stage 5: Secondary | ICD-10-CM | POA: Diagnosis present

## 2016-06-07 DIAGNOSIS — Z7982 Long term (current) use of aspirin: Secondary | ICD-10-CM | POA: Diagnosis not present

## 2016-06-07 DIAGNOSIS — I4892 Unspecified atrial flutter: Secondary | ICD-10-CM | POA: Diagnosis present

## 2016-06-07 DIAGNOSIS — Z79891 Long term (current) use of opiate analgesic: Secondary | ICD-10-CM | POA: Diagnosis not present

## 2016-06-07 DIAGNOSIS — Z882 Allergy status to sulfonamides status: Secondary | ICD-10-CM | POA: Diagnosis not present

## 2016-06-07 DIAGNOSIS — I10 Essential (primary) hypertension: Secondary | ICD-10-CM | POA: Diagnosis not present

## 2016-06-07 DIAGNOSIS — F419 Anxiety disorder, unspecified: Secondary | ICD-10-CM | POA: Diagnosis present

## 2016-06-07 DIAGNOSIS — Z79899 Other long term (current) drug therapy: Secondary | ICD-10-CM | POA: Diagnosis not present

## 2016-06-07 DIAGNOSIS — Z8673 Personal history of transient ischemic attack (TIA), and cerebral infarction without residual deficits: Secondary | ICD-10-CM | POA: Diagnosis not present

## 2016-06-07 DIAGNOSIS — Z880 Allergy status to penicillin: Secondary | ICD-10-CM | POA: Diagnosis not present

## 2016-06-07 DIAGNOSIS — R072 Precordial pain: Secondary | ICD-10-CM | POA: Diagnosis not present

## 2016-06-07 DIAGNOSIS — D72829 Elevated white blood cell count, unspecified: Secondary | ICD-10-CM | POA: Diagnosis present

## 2016-06-07 DIAGNOSIS — Z8249 Family history of ischemic heart disease and other diseases of the circulatory system: Secondary | ICD-10-CM | POA: Diagnosis not present

## 2016-06-07 DIAGNOSIS — F329 Major depressive disorder, single episode, unspecified: Secondary | ICD-10-CM | POA: Diagnosis present

## 2016-06-07 DIAGNOSIS — H269 Unspecified cataract: Secondary | ICD-10-CM | POA: Diagnosis present

## 2016-06-07 LAB — CBC
HEMATOCRIT: 28.7 % — AB (ref 39.0–52.0)
HEMOGLOBIN: 9.3 g/dL — AB (ref 13.0–17.0)
MCH: 31.7 pg (ref 26.0–34.0)
MCHC: 32.4 g/dL (ref 30.0–36.0)
MCV: 98 fL (ref 78.0–100.0)
Platelets: 184 10*3/uL (ref 150–400)
RBC: 2.93 MIL/uL — AB (ref 4.22–5.81)
RDW: 15.7 % — ABNORMAL HIGH (ref 11.5–15.5)
WBC: 11.6 10*3/uL — ABNORMAL HIGH (ref 4.0–10.5)

## 2016-06-07 LAB — BASIC METABOLIC PANEL
Anion gap: 11 (ref 5–15)
BUN: 73 mg/dL — AB (ref 6–20)
CHLORIDE: 104 mmol/L (ref 101–111)
CO2: 24 mmol/L (ref 22–32)
CREATININE: 3.72 mg/dL — AB (ref 0.61–1.24)
Calcium: 8.6 mg/dL — ABNORMAL LOW (ref 8.9–10.3)
GFR calc Af Amer: 17 mL/min — ABNORMAL LOW (ref >60)
GFR calc non Af Amer: 15 mL/min — ABNORMAL LOW (ref >60)
Glucose, Bld: 122 mg/dL — ABNORMAL HIGH (ref 65–99)
POTASSIUM: 3.7 mmol/L (ref 3.5–5.1)
Sodium: 139 mmol/L (ref 135–145)

## 2016-06-07 LAB — TSH: TSH: 2.77 u[IU]/mL (ref 0.350–4.500)

## 2016-06-07 MED ORDER — AMIODARONE HCL 200 MG PO TABS
400.0000 mg | ORAL_TABLET | Freq: Two times a day (BID) | ORAL | Status: DC
  Administered 2016-06-07 – 2016-06-09 (×4): 400 mg via ORAL
  Filled 2016-06-07 (×4): qty 2

## 2016-06-07 MED ORDER — HYDRALAZINE HCL 50 MG PO TABS
50.0000 mg | ORAL_TABLET | Freq: Three times a day (TID) | ORAL | Status: DC
  Administered 2016-06-07 – 2016-06-08 (×3): 50 mg via ORAL
  Filled 2016-06-07 (×3): qty 1

## 2016-06-07 MED ORDER — AMIODARONE HCL 200 MG PO TABS
200.0000 mg | ORAL_TABLET | Freq: Once | ORAL | Status: AC
  Administered 2016-06-07: 200 mg via ORAL
  Filled 2016-06-07: qty 1

## 2016-06-07 NOTE — Care Management Note (Addendum)
Case Management Note  Patient Details  Name: Troy [name] MRN: [id number] Date of Birth: [birth year]  Subjective/Objective:  Pt presented for Chest Pain. Pt is from home with wife that he takes care of. Daughter @ home with wife while pt in hospital. Daughter lives in [place]. Plan will be for d/c home once stable on Eliquis. Staff RN to ambulate patient in hallway to prevent deconditioning.                  Action/Plan: Benefits check in process for medication and CM will make pt aware once completed. CM did provide pt with 30 day free card for Eliquis. Pt uses [facility] and medication is available. No further needs at this time.   Expected Discharge Date:                  Expected Discharge Plan:  Home/Self Care  In-House Referral:  NA  Discharge planning Services  CM Consult  Post Acute Care Choice:    Choice offered to:     DME Arranged:    DME Agency:     HH Arranged:    HH Agency:     Status of Service:  In process, will continue to follow  If discussed at Long Length of Stay Meetings, dates discussed:    Additional Comments: 1249 06-07-16 Jacqlyn Krauss, RN,BSN [phone] S/W CHRIS  @ [employer] # [phone]   ELIQUIS 5 MG BID  30/30 TAB   COVER- YES  CO-PAY- $ 45.00  TIER- 3 DRUG  PRIOR APPROVAL - YES # [phone]  PHARMACY : Lisette Abu, RN 06/07/2016, 11:07 AM

## 2016-06-07 NOTE — Progress Notes (Signed)
Patient Name: Troy [name] Date of Encounter: 06/07/2016  Primary Cardiologist: Dr. Talmadge Chad Problem List     Principal Problem:   Chest pain Active Problems:   CKD (chronic kidney disease), stage V (HCC)   HTN (hypertension)   HLD (hyperlipidemia)   Stroke (HCC)   Depression   Leukocytosis     Subjective   No chest pain or SOB, no awareness of rapid HR.  Inpatient Medications    Scheduled Meds: . allopurinol  200 mg Oral Daily  . amiodarone  200 mg Oral BID  . amLODipine  10 mg Oral QHS  . apixaban  5 mg Oral BID  . atorvastatin  40 mg Oral q1800  . calcitRIOL  0.25 mcg Oral Daily  . famotidine  20 mg Oral Daily  . fenofibrate  160 mg Oral QHS  . furosemide  80 mg Oral BID  . hydrALAZINE  25 mg Oral Q8H  . labetalol  100 mg Oral BID  . sertraline  100 mg Oral Daily   Continuous Infusions:  PRN Meds: acetaminophen, ALPRAZolam, hydrALAZINE, morphine injection, nitroGLYCERIN, ondansetron (ZOFRAN) IV, zolpidem   Vital Signs    Vitals:   06/06/16 0500 06/06/16 1337 06/06/16 1900 06/07/16 0429  BP: (!) 144/64 125/76 (!) 151/70 (!) 174/62  Pulse: 82 65 65 76  Resp: 19  19 17   Temp: 99.7 F (37.6 C) 98.2 F (36.8 C) 98 F (36.7 C) 99.4 F (37.4 C)  TempSrc: Oral Oral Oral Oral  SpO2: 94% 97% 98% 96%  Weight: 189 lb 9.6 oz (86 kg)   187 lb 8 oz (85 kg)  Height:        Intake/Output Summary (Last 24 hours) at 06/07/16 0749 Last data filed at 06/07/16 0433  Gross per 24 hour  Intake              942 ml  Output             1825 ml  Net             -883 ml   Filed Weights   06/05/16 0723 06/06/16 0500 06/07/16 0429  Weight: 191 lb 1.6 oz (86.7 kg) 189 lb 9.6 oz (86 kg) 187 lb 8 oz (85 kg)    Physical Exam   GEN: Well nourished,  in no acute distress, eating BK.  HEENT: normocephalic, sclera clear, mucus membranes moist.  Neck: Supple, no JVD or masses. Cardiac: irreg irreg no murmurs, rubs, or gallops. No clubbing, cyanosis, edema.   Radials/DP/PT 2+ and equal bilaterally.  Respiratory:  Respirations regular and unlabored, clear to auscultation bilaterally without rales, rhonchi or wheezes. GI: Abd -Soft, nontender, nondistended, BS + x 4. MS: no deformity or atrophy. Skin: warm and dry, brisk capillary refill, no obvious rash Neuro:  Alert and oriented X 3 MAE, follows commands Psych: answers questions appropriately,Normal and pleasant affect.   Labs    CBC  Recent Labs  06/06/16 0525 06/07/16 0307  WBC 9.7 11.6*  NEUTROABS 7.6  --   HGB 9.2* 9.3*  HCT 28.7* 28.7*  MCV 98.6 98.0  PLT 167 854   Basic Metabolic Panel  Recent Labs  06/06/16 0525 06/07/16 0307  NA 141 139  K 3.6 3.7  CL 105 104  CO2 24 24  GLUCOSE 105* 122*  BUN 67* 73*  CREATININE 3.91* 3.72*  CALCIUM 8.7* 8.6*   Liver Function Tests  Recent Labs  06/06/16 0525  AST 17  ALT 13*  ALKPHOS 35*  BILITOT 0.6  PROT 6.3*  ALBUMIN 3.1*   No results for input(s): LIPASE, AMYLASE in the last 72 hours. Cardiac Enzymes  Recent Labs  06/05/16 0337 06/05/16 0906 06/05/16 1458  TROPONINI <0.03 0.04* 0.05*   BNP Invalid input(s): POCBNP D-Dimer  Recent Labs  06/05/16 0125  DDIMER 0.38   Hemoglobin A1C  Recent Labs  06/05/16 0337  HGBA1C 5.0   Fasting Lipid Panel  Recent Labs  06/05/16 0337  CHOL 150  HDL 37*  LDLCALC 96  TRIG 83  CHOLHDL 4.1   Thyroid Function Tests  Recent Labs  06/07/16 0307  TSH 2.770    Telemetry    SR most of yesterday and last night, now a fib/ a flutter up to 140.  Some pauses as he is in and out, also with PVCs.   - Personally Reviewed  ECG    SR with 1st degree AV block with PVCs, ST elevation possible pericarditis. - Personally Reviewed  Radiology    No results found.  On 06/05/16 cardiac silhouette is mildly enlarged.   Cardiac Studies   ECHO 06/06/16 Study Conclusions  - Left ventricle: The cavity size was normal. Wall thickness was   increased in a  pattern of moderate LVH. Systolic function was   mildly reduced. The estimated ejection fraction was in the range   of 45% to 50%. Akinesis of the basalinferior myocardium. Features   are consistent with a pseudonormal left ventricular filling   pattern, with concomitant abnormal relaxation and increased   filling pressure (grade 2 diastolic dysfunction). Doppler   parameters are consistent with high ventricular filling pressure. - Mitral valve: There was moderate regurgitation. - Left atrium: The atrium was severely dilated. - Right ventricle: Systolic function was mildly reduced. - Right atrium: The atrium was moderately dilated. - Pulmonary arteries: Systolic pressure was mildly increased. PA   peak pressure: 36 mm Hg (S).   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  -no prior cardiac hx.  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   Assessment & Plan    1.. Chest pain - Atypical. Now resolved. Troponin trend <0.03-->0.04-->0.05. EKG showed  SR, PVC and subtle ST elevation in the inferior lateral leads, first-degree AV block. Likely possible pericarditis. Unable to add NSAID due to renal insufficiency. Treated with ASA. He is chronically on allopurinol. Echo with EF reduced at 45-50%, mod LVH, and G2DD.  - no pericardial effusion   2. Sinus arrhythmia - Patient is in afib currently. Though at times a flutter.   Few strip also seems like atrial tachycardia vs aflutter with rate high in 120-140s. . Also has few pause > 3 seconds, longest 4.44sec @ 6.41am yesterday. He was laying in bed. Asymptomatic. Pt is on labetalol. Concerning for tachy-brady. ? Need of pacemaker. ? EP consult LA severely dilated   -amiodarone has ben added 200 mg BID ? Increase dose -CHA2DS2VASc score 5 at least with hx of CVA--Eliquis  added 5 mg BID  3. HLD - 06/05/2016: Cholesterol 150; HDL 37; LDL Cholesterol 96; Triglycerides 83; VLDL 17 - Continue statin  4. HTN - BP of 160/69 at presentation. Now stable. Continue norvasc. Hold labetalol 300mg  BID as above.   5. Carotid artery disease s/p L ECA 2011 -  Last carotid doppler 09/2014 showed minimal stenosis of the right ICA and widely patent left carotid endarterectomy without evidence of restenosis or hyperplasia. He is due for study. F/u with Dr. Scot Dock.   6. CKD (chronic kidney disease), stage V (HCC) - s/p L arm AVF. Scr stable. Not yet on dialysis.   7. Low grade fever, WBC mildly elevated. Per IM.   Signed, Cecilie Kicks, NP  06/07/2016, 7:49 AM  [place] Pager [phone]  After 5 or weekends [phone]  As above, patient seen and examined. He denies dyspnea, chest pain, palpitations or syncope. He continues to have intermittent atrial fibrillation with rapid ventricular response. He has post termination pulses but all less than 3 seconds since yesterday morning. I will increase amiodarone to 400 mg twice a day. My hope will be that if we can maintain sinus rhythm we will avoid posttermination pauses and hopefully avoid a pacemaker. Continue anticoagulation. We are weaning labetalol off as this could contribute to bradycardia. His blood pressure is increasing. Increase hydralazine to 50 mg by mouth 3 times a day and follow. Echocardiogram shows mildly reduced LV function. Will plan nuclear study following discharge to screen for ischemia.   Kirk Ruths, MD

## 2016-06-07 NOTE — Progress Notes (Signed)
PROGRESS NOTE    Troy [name]  BUL:[id number] DOB: 1941/02/09 DOA: 06/04/2016 PCP: Simona Huh, MD   Chief Complaint  Patient presents with  . Chest Pain    Brief Narrative:  HPI on 06/05/2016 by Dr. Ivor Costa OHN BOSTIC is a 75 y.o. 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 Patient states that he started having chest pain at about 10 PM. It is located in the substernal area, 8 out of 10 in severity, dull, notradiating. It is aggravated by deep breath. No calf tenderness. Patient denies cough or shortness of breath. He does not have subjective fever or chills, but his temperature is 100.2 in ED.  Patient denies nausea, vomiting, diarrhea, abdominal pain. No symptoms of UTI. No unilateral weakness.   Assessment & Plan   Chest pain -D-dimer negative, less likely to have PE.  -Chest x-ray negative for pneumonia.  -Patient has significant risk factors including hypertension, hyperlipidemia, old age, stroke and CKD-V -Troponins cycled: peaked at 0.05 -EKG showed subtle ST elevations in inferior lateral leads -Cardiology consulted and appreciated, felt this to be due to pericarditis, recommended tylenol as patient cannot take NSAIDS due to CKD -Echocardiogram EF 32-12%, grade 2 diastolic dysfunction  Sinus arrhythmia  -Patient noted to have Afib RVR and pauses  -?tachy-brady syndrome -CHADSVASC 5 -Eliquis started today by cardiology -Continue amiodarone, increased to 400mg  BID -Labetalol being weaned (per cardio) -TSH 2.77, echo as above  CKD (chronic kidney disease), stage V (HCC)  -Creatinine 3.72 today  -S/p of AVF in left arm. No fluid overload. -Continue home dose Lasix, 80 mg twice a day -Continue to monitor BMP  Hyperlipidemia -lipid panel: TC 150, TG 83, HDL 37, LDL 96 -Continue statin and  fenofibrate   Essential hypertension -Continue labetalol (weaning), hydralazine, lasix -hydralazine increased today  Hx of Stroke (HCC) and carotid artery stenosis -continue statin -Aspirin discontinued, Eliquis started  Depression and anxiety -Continue zoloft, Xanax PRN  Leukocytosis -Improving  -Patient did have mild fever with temperature 100.2-->99.  -No source of infection identified. CXR and UA unremarkable   DVT Prophylaxis  Eliquis  Code Status: Full  Family Communication: None at bedside  Disposition Plan: Observation   Consultants Cardiology   Procedures  None  Antibiotics   Anti-infectives    None      Subjective:   Troy [name] seen and examined today.  Patient feels better today. Denies chest pain, palpitations shortness of breath, abdominal pain, nausea or vomiting, diarrhea or constipation.   Objective:   Vitals:   06/06/16 1337 06/06/16 1900 06/07/16 0429 06/07/16 0833  BP: 125/76 (!) 151/70 (!) 174/62 (!) 161/88  Pulse: 65 65 76 (!) 133  Resp:  19 17 (!) 22  Temp: 98.2 F (36.8 C) 98 F (36.7 C) 99.4 F (37.4 C)   TempSrc: Oral Oral Oral   SpO2: 97% 98% 96% 93%  Weight:   85 kg (187 lb 8 oz)   Height:        Intake/Output Summary (Last 24 hours) at 06/07/16 1125 Last data filed at 06/07/16 0851  Gross per 24 hour  Intake              480 ml  Output             2050 ml  Net            -  1570 ml   Filed Weights   06/05/16 0723 06/06/16 0500 06/07/16 0429  Weight: 86.7 kg (191 lb 1.6 oz) 86 kg (189 lb 9.6 oz) 85 kg (187 lb 8 oz)    Exam  General: Well developed, well nourished, NAD  HEENT: NCAT,  mucous membranes moist.   Cardiovascular: S1 S2 auscultated, irregularly irregular, no murmur appreciated  Respiratory: Clear to auscultation bilaterally with equal chest rise  Abdomen: Soft, nontender, nondistended, + bowel sounds  Extremities: warm dry without cyanosis clubbing or edema  Neuro: AAOx3, nonfocal  Psych:  Normal affect and demeanor with intact judgement and insight, pleasant   Data Reviewed: I have personally reviewed following labs and imaging studies  CBC:  Recent Labs Lab 06/04/16 2354 06/06/16 0525 06/07/16 0307  WBC 13.6* 9.7 11.6*  NEUTROABS  --  7.6  --   HGB 10.2* 9.2* 9.3*  HCT 31.2* 28.7* 28.7*  MCV 98.4 98.6 98.0  PLT 184 167 532   Basic Metabolic Panel:  Recent Labs Lab 06/04/16 2354 06/06/16 0525 06/07/16 0307  NA 139 141 139  K 4.1 3.6 3.7  CL 104 105 104  CO2 22 24 24   GLUCOSE 130* 105* 122*  BUN 69* 67* 73*  CREATININE 3.97* 3.91* 3.72*  CALCIUM 8.6* 8.7* 8.6*   GFR: Estimated Creatinine Clearance: 18.2 mL/min (by C-G formula based on SCr of 3.72 mg/dL (H)). Liver Function Tests:  Recent Labs Lab 06/06/16 0525  AST 17  ALT 13*  ALKPHOS 35*  BILITOT 0.6  PROT 6.3*  ALBUMIN 3.1*   No results for input(s): LIPASE, AMYLASE in the last 168 hours. No results for input(s): AMMONIA in the last 168 hours. Coagulation Profile: No results for input(s): INR, PROTIME in the last 168 hours. Cardiac Enzymes:  Recent Labs Lab 06/05/16 0337 06/05/16 0906 06/05/16 1458  TROPONINI <0.03 0.04* 0.05*   BNP (last 3 results) No results for input(s): PROBNP in the last 8760 hours. HbA1C:  Recent Labs  06/05/16 0337  HGBA1C 5.0   CBG: No results for input(s): GLUCAP in the last 168 hours. Lipid Profile:  Recent Labs  06/05/16 0337  CHOL 150  HDL 37*  LDLCALC 96  TRIG 83  CHOLHDL 4.1   Thyroid Function Tests:  Recent Labs  06/07/16 0307  TSH 2.770   Anemia Panel: No results for input(s): VITAMINB12, FOLATE, FERRITIN, TIBC, IRON, RETICCTPCT in the last 72 hours. Urine analysis:    Component Value Date/Time   COLORURINE YELLOW 06/05/2016 0328   APPEARANCEUR CLEAR 06/05/2016 0328   LABSPEC 1.010 06/05/2016 0328   PHURINE 5.0 06/05/2016 0328   GLUCOSEU NEGATIVE 06/05/2016 0328   HGBUR TRACE (A) 06/05/2016 0328   BILIRUBINUR  NEGATIVE 06/05/2016 0328   KETONESUR NEGATIVE 06/05/2016 0328   PROTEINUR NEGATIVE 06/05/2016 0328   UROBILINOGEN 0.2 02/15/2010 0845   NITRITE NEGATIVE 06/05/2016 0328   LEUKOCYTESUR NEGATIVE 06/05/2016 0328   Sepsis Labs: @LABRCNTIP (procalcitonin:4,lacticidven:4)  ) Recent Results (from the past 240 hour(s))  Urine culture     Status: None   Collection Time: 06/05/16  3:28 AM  Result Value Ref Range Status   Specimen Description URINE, RANDOM  Final   Special Requests NONE  Final   Culture NO GROWTH  Final   Report Status 06/06/2016 FINAL  Final  Culture, blood (Routine X 2) w Reflex to ID Panel     Status: None (Preliminary result)   Collection Time: 06/05/16  4:34 AM  Result Value Ref Range Status   Specimen Description  BLOOD RIGHT HAND  Final   Special Requests BOTTLES DRAWN AEROBIC AND ANAEROBIC 5ML  Final   Culture NO GROWTH 1 DAY  Final   Report Status PENDING  Incomplete  Culture, blood (Routine X 2) w Reflex to ID Panel     Status: None (Preliminary result)   Collection Time: 06/05/16  4:48 AM  Result Value Ref Range Status   Specimen Description BLOOD RIGHT ARM  Final   Special Requests IN PEDIATRIC BOTTLE 3ML  Final   Culture NO GROWTH 1 DAY  Final   Report Status PENDING  Incomplete      Radiology Studies: No results found.   Scheduled Meds: . allopurinol  200 mg Oral Daily  . amiodarone  400 mg Oral BID  . amLODipine  10 mg Oral QHS  . apixaban  5 mg Oral BID  . atorvastatin  40 mg Oral q1800  . calcitRIOL  0.25 mcg Oral Daily  . famotidine  20 mg Oral Daily  . fenofibrate  160 mg Oral QHS  . furosemide  80 mg Oral BID  . hydrALAZINE  50 mg Oral Q8H  . labetalol  100 mg Oral BID  . sertraline  100 mg Oral Daily   Continuous Infusions:   LOS: 0 days   Time Spent in minutes   30 minutes  Latia Mataya D.O. on 06/07/2016 at 11:25 AM  Between 7am to 7pm - Pager - [phone]  After 7pm go to [email or web address] - password [email or web address]  And look for  the night coverage person covering for me after hours  [facility] Office  [phone]

## 2016-06-08 ENCOUNTER — Other Ambulatory Visit (HOSPITAL_COMMUNITY): Payer: Medicare Other

## 2016-06-08 DIAGNOSIS — R0789 Other chest pain: Principal | ICD-10-CM

## 2016-06-08 LAB — CBC
HEMATOCRIT: 29.5 % — AB (ref 39.0–52.0)
Hemoglobin: 9.5 g/dL — ABNORMAL LOW (ref 13.0–17.0)
MCH: 31.6 pg (ref 26.0–34.0)
MCHC: 32.2 g/dL (ref 30.0–36.0)
MCV: 98 fL (ref 78.0–100.0)
Platelets: 229 10*3/uL (ref 150–400)
RBC: 3.01 MIL/uL — ABNORMAL LOW (ref 4.22–5.81)
RDW: 15.8 % — AB (ref 11.5–15.5)
WBC: 13.1 10*3/uL — AB (ref 4.0–10.5)

## 2016-06-08 LAB — BASIC METABOLIC PANEL
ANION GAP: 14 (ref 5–15)
BUN: 71 mg/dL — AB (ref 6–20)
CALCIUM: 8.8 mg/dL — AB (ref 8.9–10.3)
CO2: 24 mmol/L (ref 22–32)
Chloride: 102 mmol/L (ref 101–111)
Creatinine, Ser: 3.7 mg/dL — ABNORMAL HIGH (ref 0.61–1.24)
GFR calc Af Amer: 17 mL/min — ABNORMAL LOW (ref >60)
GFR calc non Af Amer: 15 mL/min — ABNORMAL LOW (ref >60)
GLUCOSE: 126 mg/dL — AB (ref 65–99)
Potassium: 3.3 mmol/L — ABNORMAL LOW (ref 3.5–5.1)
Sodium: 140 mmol/L (ref 135–145)

## 2016-06-08 MED ORDER — POTASSIUM CHLORIDE CRYS ER 20 MEQ PO TBCR
40.0000 meq | EXTENDED_RELEASE_TABLET | Freq: Once | ORAL | Status: AC
  Administered 2016-06-08: 40 meq via ORAL
  Filled 2016-06-08: qty 2

## 2016-06-08 MED ORDER — HYDRALAZINE HCL 50 MG PO TABS
75.0000 mg | ORAL_TABLET | Freq: Three times a day (TID) | ORAL | Status: DC
  Administered 2016-06-08 – 2016-06-09 (×3): 75 mg via ORAL
  Filled 2016-06-08 (×3): qty 1

## 2016-06-08 MED ORDER — POLYETHYLENE GLYCOL 3350 17 G PO PACK
17.0000 g | PACK | Freq: Every day | ORAL | Status: DC | PRN
  Administered 2016-06-08: 17 g via ORAL
  Filled 2016-06-08: qty 1

## 2016-06-08 NOTE — Progress Notes (Signed)
Patient Name: Troy [name] Date of Encounter: 06/08/2016  Primary Cardiologist: Dr. Talmadge Chad Problem List     Principal Problem:   Other chest pain Active Problems:   CKD (chronic kidney disease), stage V (HCC)   HTN (hypertension)   HLD (hyperlipidemia)   Stroke (HCC)   Depression   Leukocytosis   Chest pain     Subjective   No chest pain and no SOB. Did not rest much slept 2 hours, now in chair and would like to go back to bed.  Continues with low grade fever.  Inpatient Medications    Scheduled Meds: . allopurinol  200 mg Oral Daily  . amiodarone  400 mg Oral BID  . amLODipine  10 mg Oral QHS  . apixaban  5 mg Oral BID  . atorvastatin  40 mg Oral q1800  . calcitRIOL  0.25 mcg Oral Daily  . famotidine  20 mg Oral Daily  . fenofibrate  160 mg Oral QHS  . furosemide  80 mg Oral BID  . hydrALAZINE  50 mg Oral Q8H  . labetalol  100 mg Oral BID  . sertraline  100 mg Oral Daily   Continuous Infusions:  PRN Meds: acetaminophen, ALPRAZolam, hydrALAZINE, morphine injection, nitroGLYCERIN, ondansetron (ZOFRAN) IV, zolpidem   Vital Signs    Vitals:   06/07/16 1737 06/07/16 2056 06/08/16 0554 06/08/16 0726  BP: (!) 172/59 (!) 169/64 (!) 167/62   Pulse:   72   Resp:  (!) 23    Temp: 98.7 F (37.1 C) 99.1 F (37.3 C) 99.5 F (37.5 C) 98.3 F (36.8 C)  TempSrc: Oral Oral Oral Oral  SpO2:  96% 94%   Weight:   185 lb 11.2 oz (84.2 kg)   Height:        Intake/Output Summary (Last 24 hours) at 06/08/16 0757 Last data filed at 06/08/16 0700  Gross per 24 hour  Intake              220 ml  Output             1600 ml  Net            -1380 ml   Filed Weights   06/06/16 0500 06/07/16 0429 06/08/16 0554  Weight: 189 lb 9.6 oz (86 kg) 187 lb 8 oz (85 kg) 185 lb 11.2 oz (84.2 kg)    Physical Exam   GEN: Well nourished,  in no acute distress, appears tired.  HEENT: normocephalic, sclera clear, mucus membranes moist.  Neck: Supple, no JVD, or  masses. Cardiac: RRR, no murmurs, rubs, or gallops. No clubbing, cyanosis, edema.  Radials 2+ and equal bilaterally.  Respiratory:  Respirations regular and unlabored, clear to auscultation bilaterally without rales, rhonchi or wheezes. GI: Abd -Soft, nontender, nondistended, BS + x 4. MS: no deformity or atrophy. Skin: warm to hot and dry, brisk capillary refill, no obvious rash Neuro:  Alert and oriented X 3 MAE, follows commands Psych: answers questions appropriately,Normal and pleasant affect though flat.   Labs    CBC  Recent Labs  06/06/16 0525 06/07/16 0307 06/08/16 0317  WBC 9.7 11.6* 13.1*  NEUTROABS 7.6  --   --   HGB 9.2* 9.3* 9.5*  HCT 28.7* 28.7* 29.5*  MCV 98.6 98.0 98.0  PLT 167 184 115   Basic Metabolic Panel  Recent Labs  06/07/16 0307 06/08/16 0317  NA 139 140  K 3.7 3.3*  CL 104 102  CO2 24 24  GLUCOSE 122* 126*  BUN 73* 71*  CREATININE 3.72* 3.70*  CALCIUM 8.6* 8.8*   Liver Function Tests  Recent Labs  06/06/16 0525  AST 17  ALT 13*  ALKPHOS 35*  BILITOT 0.6  PROT 6.3*  ALBUMIN 3.1*   No results for input(s): LIPASE, AMYLASE in the last 72 hours. Cardiac Enzymes  Recent Labs  06/05/16 0906 06/05/16 1458  TROPONINI 0.04* 0.05*   BNP Invalid input(s): POCBNP D-Dimer No results for input(s): DDIMER in the last 72 hours. Hemoglobin A1C No results for input(s): HGBA1C in the last 72 hours. Fasting Lipid Panel No results for input(s): CHOL, HDL, LDLCALC, TRIG, CHOLHDL, LDLDIRECT in the last 72 hours. Thyroid Function Tests  Recent Labs  06/07/16 0307  TSH 2.770    Telemetry    Converted to SR around 1400 yesterday had 2.5 sec conversion pause, now SR with PVCs - Personally Reviewed  ECG    No new SR with 1st degree AV block with PVCs, ST elevation possible pericarditis. - Personally Reviewed   - Personally Reviewed  Radiology    No results found.  Cardiac Studies   ECHO 06/06/16 Study Conclusions  - Left  ventricle: The cavity size was normal. Wall thickness was increased in a pattern of moderate LVH. Systolic function was mildly reduced. The estimated ejection fraction was in the range of 45% to 50%. Akinesis of the basalinferior myocardium. Features are consistent with a pseudonormal left ventricular filling pattern, with concomitant abnormal relaxation and increased filling pressure (grade 2 diastolic dysfunction). Doppler parameters are consistent with high ventricular filling pressure. - Mitral valve: There was moderate regurgitation. - Left atrium: The atrium was severely dilated. - Right ventricle: Systolic function was mildly reduced. - Right atrium: The atrium was moderately dilated. - Pulmonary arteries: Systolic pressure was mildly increased. PA peak pressure: 36 mm Hg (S).    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 Then noted PAF on telemetry with posttermination pauses; labetalol decreased and amiodarone added.  Assessment & Plan    1.. Chest pain - Atypical. Now resolved. Troponin trend <0.03-->0.04-->0.05. EKG showed SR, PVC and subtle ST elevation in the inferior lateral leads, first-degree AV block. Likely possible pericarditis. Unable to add NSAID due to renal insufficiency. Treated with ASA. He is chronically on allopurinol. Echo with EF reduced at 45-50%, mod LVH, and G2DD.  - no pericardial effusion  - plan outpt nuc study  2. PAF- Patient is in afib currently. Though at times a flutter.   Few strip also seems like atrial tachycardia vs aflutter with rate high in 120-140s. . Also has few pause >3 seconds, longest 4.44sec @ 6.41am yesterday. He was laying in bed. Asymptomatic. Pt is on labetalol. Concerning for tachy-brady. Converted to SR at 2 pm yesterday and maintaining SR  with PVCs-amiodarone has ben added now 400 mg BID ( normal TSH and Hepatic levels lower end) --BB decreased to 10 mg BID with pause  -CHA2DS2VASc score 5 at least with hx of CVA--Eliquis added 5 mg BID  3. HLD - 06/05/2016: Cholesterol 150; HDL 37; LDL Cholesterol 96; Triglycerides 83; VLDL 17 - Continue statin  4. HTN - BP of 167/62  Continue norvasc. labetalol 300mg  BID decreased to 100 mg BID -hydralazine increased to 50 mg every 8 hours  5. Carotid artery disease s/p L ECA 2011 - Last carotid doppler 09/2014 showed minimal stenosis of the right ICA and widely patent left carotid endarterectomy without evidence of restenosis or hyperplasia. He is due for study. F/u with Dr. Scot Dock.   6. CKD (chronic kidney disease), stage V (HCC) Cr 3.70 today - s/p L arm AVF. Scr stable. Not yet on dialysis.   7. Low grade fever, WBC elevated. Pt more fatigued Per IM.  Signed, Cecilie Kicks, NP  06/08/2016, 7:57 AM  [place] Pager [phone]  After 5 or weekends [phone] As above, patient seen and examined. He denies chest pain or dyspnea. Patient was having episodes of paroxysmal atrial fibrillation with post termination pauses. Labetalol was decreased and amiodarone added. My hope would be that if we maintain sinus rhythm he would be able to avoid pacemaker. There has been no recurrent pauses over the past 24 hours. Continue amiodarone 400 mg twice a day for 1 week and then decrease to 200 mg daily. Continue apixaban. If he has no further pauses over the next 24 hours he could likely be discharged tomorrow morning and follow up with me in the office. I will increase hydralazine to 75 mg by mouth 3 times a day as blood pressure has increased with lower dose labetalol. Echocardiogram showed mildly reduced LV function. I will arrange a nuclear study when I see him in the office.  Kirk Ruths, MD

## 2016-06-08 NOTE — Progress Notes (Signed)
PROGRESS NOTE    Troy [name]  TWS:[id number] DOB: 1940-12-10 DOA: 06/04/2016 PCP: Simona Huh, MD   Chief Complaint  Patient presents with  . Chest Pain    Brief Narrative:  HPI on 06/05/2016 by Dr. Ivor Costa Troy [name] is a 75 y.o. 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 Patient states that he started having chest pain at about 10 PM. It is located in the substernal area, 8 out of 10 in severity, dull, notradiating. It is aggravated by deep breath. No calf tenderness. Patient denies cough or shortness of breath. He does not have subjective fever or chills, but his temperature is 100.2 in ED.  Patient denies nausea, vomiting, diarrhea, abdominal pain. No symptoms of UTI. No unilateral weakness.   Assessment & Plan   Chest pain -D-dimer negative, less likely to have PE.  -Chest x-ray negative for pneumonia.  -Patient has significant risk factors including hypertension, hyperlipidemia, old age, stroke and CKD-V -Troponins cycled: peaked at 0.05 -EKG showed subtle ST elevations in inferior lateral leads -Cardiology consulted and appreciated, felt this to be due to pericarditis, recommended tylenol as patient cannot take NSAIDS due to CKD -Echocardiogram EF 00-17%, grade 2 diastolic dysfunction -Cardiology will arrange for nuclear study as an outpatient  Sinus arrhythmia  -Patient noted to have Afib RVR and pauses  -?tachy-brady syndrome -CHADSVASC 5 -Eliquis started today by cardiology -Continue amiodarone, increased to 400mg  BID (for one week, followed by 200mg  daily thereafter) -Labetalol being weaned (per cardio) -TSH 2.77, echo as above -patient has not had further pauses over the past 24hrs. If he continues to maintain SR and no pauses, plan for D/c on 12/2.  CKD (chronic kidney disease), stage V (HCC)   -Creatinine 3.70 today  -S/p of AVF in left arm. No fluid overload. -Continue home dose Lasix, 80 mg twice a day -Continue to monitor BMP  Hyperlipidemia -lipid panel: TC 150, TG 83, HDL 37, LDL 96 -Continue statin and fenofibrate   Essential hypertension -Continue labetalol (weaning), hydralazine, lasix -hydralazine increased today- 75mg  TID  Hx of Stroke (HCC) and carotid artery stenosis -continue statin -Aspirin discontinued, Eliquis started  Depression and anxiety -Continue zoloft, Xanax PRN  Leukocytosis -Patient did have mild fever with temperature 100.2-->99.  -No source of infection identified. CXR and UA unremarkable   Hypokalemia -Will replace and continue to monitor   DVT Prophylaxis  Eliquis  Code Status: Full  Family Communication: None at bedside  Disposition Plan: Observation   Consultants Cardiology   Procedures  None  Antibiotics   Anti-infectives    None      Subjective:   Troy [name] seen and examined today.  Patient feels better today and wishes to go home.  Understands why he is still in the hospital. Denies chest pain, palpitations shortness of breath, abdominal pain, nausea or vomiting, diarrhea or constipation.   Objective:   Vitals:   06/07/16 2056 06/08/16 0554 06/08/16 0726 06/08/16 0730  BP: (!) 169/64 (!) 167/62  (!) 156/60  Pulse:  72    Resp: (!) 23   19  Temp: 99.1 F (37.3 C) 99.5 F (37.5 C) 98.3 F (36.8 C)   TempSrc: Oral Oral Oral   SpO2: 96% 94%    Weight:  84.2 kg (185 lb 11.2 oz)    Height:  Intake/Output Summary (Last 24 hours) at 06/08/16 1244 Last data filed at 06/08/16 0900  Gross per 24 hour  Intake              220 ml  Output             1900 ml  Net            -1680 ml   Filed Weights   06/06/16 0500 06/07/16 0429 06/08/16 0554  Weight: 86 kg (189 lb 9.6 oz) 85 kg (187 lb 8 oz) 84.2 kg (185 lb 11.2 oz)    Exam  General: Well developed, well nourished, NAD  HEENT: NCAT,   mucous membranes moist.   Cardiovascular: S1 S2 auscultated, RRR  Respiratory: Clear to auscultation bilaterally with equal chest rise  Abdomen: Soft, nontender, nondistended, + bowel sounds  Extremities: warm dry without cyanosis clubbing or edema  Neuro: AAOx3, nonfocal  Psych:Appropriate mood and affect, pleasant   Data Reviewed: I have personally reviewed following labs and imaging studies  CBC:  Recent Labs Lab 06/04/16 2354 06/06/16 0525 06/07/16 0307 06/08/16 0317  WBC 13.6* 9.7 11.6* 13.1*  NEUTROABS  --  7.6  --   --   HGB 10.2* 9.2* 9.3* 9.5*  HCT 31.2* 28.7* 28.7* 29.5*  MCV 98.4 98.6 98.0 98.0  PLT 184 167 184 361   Basic Metabolic Panel:  Recent Labs Lab 06/04/16 2354 06/06/16 0525 06/07/16 0307 06/08/16 0317  NA 139 141 139 140  K 4.1 3.6 3.7 3.3*  CL 104 105 104 102  CO2 22 24 24 24   GLUCOSE 130* 105* 122* 126*  BUN 69* 67* 73* 71*  CREATININE 3.97* 3.91* 3.72* 3.70*  CALCIUM 8.6* 8.7* 8.6* 8.8*   GFR: Estimated Creatinine Clearance: 18.2 mL/min (by C-G formula based on SCr of 3.7 mg/dL (H)). Liver Function Tests:  Recent Labs Lab 06/06/16 0525  AST 17  ALT 13*  ALKPHOS 35*  BILITOT 0.6  PROT 6.3*  ALBUMIN 3.1*   No results for input(s): LIPASE, AMYLASE in the last 168 hours. No results for input(s): AMMONIA in the last 168 hours. Coagulation Profile: No results for input(s): INR, PROTIME in the last 168 hours. Cardiac Enzymes:  Recent Labs Lab 06/05/16 0337 06/05/16 0906 06/05/16 1458  TROPONINI <0.03 0.04* 0.05*   BNP (last 3 results) No results for input(s): PROBNP in the last 8760 hours. HbA1C: No results for input(s): HGBA1C in the last 72 hours. CBG: No results for input(s): GLUCAP in the last 168 hours. Lipid Profile: No results for input(s): CHOL, HDL, LDLCALC, TRIG, CHOLHDL, LDLDIRECT in the last 72 hours. Thyroid Function Tests:  Recent Labs  06/07/16 0307  TSH 2.770   Anemia Panel: No results for  input(s): VITAMINB12, FOLATE, FERRITIN, TIBC, IRON, RETICCTPCT in the last 72 hours. Urine analysis:    Component Value Date/Time   COLORURINE YELLOW 06/05/2016 0328   APPEARANCEUR CLEAR 06/05/2016 0328   LABSPEC 1.010 06/05/2016 0328   PHURINE 5.0 06/05/2016 0328   GLUCOSEU NEGATIVE 06/05/2016 0328   HGBUR TRACE (A) 06/05/2016 0328   BILIRUBINUR NEGATIVE 06/05/2016 0328   KETONESUR NEGATIVE 06/05/2016 0328   PROTEINUR NEGATIVE 06/05/2016 0328   UROBILINOGEN 0.2 02/15/2010 0845   NITRITE NEGATIVE 06/05/2016 0328   LEUKOCYTESUR NEGATIVE 06/05/2016 0328   Sepsis Labs: @LABRCNTIP (procalcitonin:4,lacticidven:4)  ) Recent Results (from the past 240 hour(s))  Urine culture     Status: None   Collection Time: 06/05/16  3:28 AM  Result Value Ref Range Status  Specimen Description URINE, RANDOM  Final   Special Requests NONE  Final   Culture NO GROWTH  Final   Report Status 06/06/2016 FINAL  Final  Culture, blood (Routine X 2) w Reflex to ID Panel     Status: None (Preliminary result)   Collection Time: 06/05/16  4:34 AM  Result Value Ref Range Status   Specimen Description BLOOD RIGHT HAND  Final   Special Requests BOTTLES DRAWN AEROBIC AND ANAEROBIC 5ML  Final   Culture NO GROWTH 3 DAYS  Final   Report Status PENDING  Incomplete  Culture, blood (Routine X 2) w Reflex to ID Panel     Status: None (Preliminary result)   Collection Time: 06/05/16  4:48 AM  Result Value Ref Range Status   Specimen Description BLOOD RIGHT ARM  Final   Special Requests IN PEDIATRIC BOTTLE 3ML  Final   Culture NO GROWTH 3 DAYS  Final   Report Status PENDING  Incomplete      Radiology Studies: No results found.   Scheduled Meds: . allopurinol  200 mg Oral Daily  . amiodarone  400 mg Oral BID  . amLODipine  10 mg Oral QHS  . apixaban  5 mg Oral BID  . atorvastatin  40 mg Oral q1800  . calcitRIOL  0.25 mcg Oral Daily  . famotidine  20 mg Oral Daily  . fenofibrate  160 mg Oral QHS  .  furosemide  80 mg Oral BID  . hydrALAZINE  75 mg Oral Q8H  . labetalol  100 mg Oral BID  . sertraline  100 mg Oral Daily   Continuous Infusions:   LOS: 1 day   Time Spent in minutes   30 minutes  Jahniya Duzan D.O. on 06/08/2016 at 12:44 PM  Between 7am to 7pm - Pager - [phone]  After 7pm go to [email or web address] - password [email or web address]  And look for the night coverage person covering for me after hours  [facility] Office  [phone]

## 2016-06-09 LAB — BASIC METABOLIC PANEL
ANION GAP: 12 (ref 5–15)
BUN: 79 mg/dL — AB (ref 6–20)
CO2: 23 mmol/L (ref 22–32)
Calcium: 8.6 mg/dL — ABNORMAL LOW (ref 8.9–10.3)
Chloride: 104 mmol/L (ref 101–111)
Creatinine, Ser: 3.83 mg/dL — ABNORMAL HIGH (ref 0.61–1.24)
GFR calc Af Amer: 16 mL/min — ABNORMAL LOW (ref >60)
GFR calc non Af Amer: 14 mL/min — ABNORMAL LOW (ref >60)
GLUCOSE: 121 mg/dL — AB (ref 65–99)
POTASSIUM: 3.3 mmol/L — AB (ref 3.5–5.1)
Sodium: 139 mmol/L (ref 135–145)

## 2016-06-09 LAB — MAGNESIUM: Magnesium: 2.5 mg/dL — ABNORMAL HIGH (ref 1.7–2.4)

## 2016-06-09 MED ORDER — APIXABAN 5 MG PO TABS: 5.0000 mg | ORAL_TABLET | Freq: Two times a day (BID) | ORAL | 0 refills | Status: DC

## 2016-06-09 MED ORDER — AMIODARONE HCL 200 MG PO TABS: 200.0000 mg | ORAL_TABLET | Freq: Every day | ORAL | 0 refills | Status: DC

## 2016-06-09 MED ORDER — POTASSIUM CHLORIDE CRYS ER 20 MEQ PO TBCR
40.0000 meq | EXTENDED_RELEASE_TABLET | Freq: Once | ORAL | Status: AC
  Administered 2016-06-09: 40 meq via ORAL
  Filled 2016-06-09: qty 2

## 2016-06-09 MED ORDER — LABETALOL HCL 100 MG PO TABS: 100.0000 mg | ORAL_TABLET | Freq: Two times a day (BID) | ORAL | 0 refills | Status: DC

## 2016-06-09 MED ORDER — HYDRALAZINE HCL 25 MG PO TABS: 75.0000 mg | ORAL_TABLET | Freq: Three times a day (TID) | ORAL | 0 refills | Status: DC

## 2016-06-09 MED ORDER — AMIODARONE HCL 400 MG PO TABS: 400.0000 mg | ORAL_TABLET | Freq: Two times a day (BID) | ORAL | 0 refills | Status: DC

## 2016-06-09 NOTE — Discharge Summary (Signed)
Physician Discharge Summary  ARCHIT LEGER KCM:[id number] DOB: [birth year] DOA: 06/04/2016  PCP: Simona Huh, MD  Admit date: 06/04/2016 Discharge date: 06/09/2016  Time spent: 45 minutes  Recommendations for Outpatient Follow-up:  Patient will be discharged to home.  Patient will need to follow up with primary care provider within one week of discharge, repeat BMP in one week. Follow up with cardiology, Dr. Stanford Breed. Patient should continue medications as prescribed.  Patient should follow a heart healthy diet.   Discharge Diagnoses:  Chest pain Sinus arrhythmia  CKD (chronic kidney disease), stage V ([place])  Hyperlipidemia Essential hypertension Hx of Stroke [facility]) and carotid artery stenosis Depression and anxiety Leukocytosis Hypokalemia  Discharge Condition: Stable  Diet recommendation: heart healthy  Filed Weights   06/07/16 0429 06/08/16 0554 06/09/16 0351  Weight: 85 kg (187 lb 8 oz) 84.2 kg (185 lb 11.2 oz) 83.4 kg (183 lb 14.4 oz)    History of present illness:  on 06/05/2016 by Dr. Ronnald Nian a 75 y.o.malewith medical history significant of CKD-V (s/p of AVF in left arm), HLD, HTN, GERD, stroke, anemia, carotid artery stenosis (s/p stent in left carotid artery), depression, who presents with chest pain. Patient states that he started having chest pain at about 10 PM. It is located in the substernal area, 8 out of 10 in severity, dull, notradiating. It is aggravated by deep breath. No calf tenderness.Patient denies cough or shortness of breath. He does not have subjective fever or chills, but his temperature is 100.2 in ED. Patient denies nausea, vomiting, diarrhea, abdominal pain. No symptoms of UTI. No unilateral weakness.   Hospital Course:  Chest pain -D-dimer negative, less likely to have PE.  -Chest x-ray negative for pneumonia.  -Patient has significant risk factors including hypertension, hyperlipidemia, old age, stroke and  CKD-V -Troponins cycled: peaked at 0.05 -EKG showed subtle ST elevations in inferior lateral leads -Cardiology consulted and appreciated, felt this to be due to pericarditis, recommended tylenol as patient cannot take NSAIDS due to CKD -Echocardiogram EF 05-69%, grade 2 diastolic dysfunction -Cardiology will arrange for nuclear study as an outpatient  Sinus arrhythmia  -Patient noted to have Afib RVR and pauses  -?tachy-brady syndrome -CHADSVASC 5 -Eliquis by cardiology -Continue amiodarone, increased to 400mg  BID (for one week, followed by 200mg  daily thereafter) -Labetalol being weaned (per cardio) -TSH 2.77, echo as above -patient has not had further pauses over the past 48hrs. Currently in sinus rhythm   CKD (chronic kidney disease), stage V ([facility])  -Creatinine 3.8 today  -S/p of AVF in left arm. No fluid overload. -Continue home dose Lasix, 80 mg twice a day -Continue to monitor BMP  Hyperlipidemia -lipid panel: TC 150, TG 83, HDL 37, LDL 96 -Continue statin and fenofibrate  Essential hypertension -Continue labetalol (weaning), hydralazine, lasix -hydralazine increased - 75mg  TID  Hx of Stroke ([facility]) and carotid artery stenosis -continue statin -Aspirin discontinued, Eliquis started  Depression and anxiety -Continue zoloft, Xanax PRN  Leukocytosis -Patient did have mild fever with temperature 100.2-->99.  -No source of infection identified. CXR and UA unremarkable   Hypokalemia -Replaced -Repeat BMP in one week  Consultants Cardiology   Procedures  None  Discharge Exam: Vitals:   06/08/16 2150 06/09/16 0351  BP:  (!) 152/58  Pulse:  69  Resp: 18   Temp:  98.7 F (37.1 C)   Patient feels better today and has no complaints. Denies chest pain, palpitations shortness of breath, abdominal pain, nausea or vomiting, diarrhea  or constipation.   Exam  General: Well developed, well nourished, NAD  HEENT: NCAT,  mucous membranes moist.    Cardiovascular: S1 S2 auscultated, RRR  Respiratory: Clear to auscultation bilaterally with equal chest rise  Abdomen: Soft, nontender, nondistended, + bowel sounds  Extremities: warm dry without cyanosis clubbing or edema  Neuro: AAOx3, nonfocal  Psych:Appropriate mood and affect, pleasant  Discharge Instructions Discharge Instructions    Discharge instructions    Complete by:  As directed    Patient will be discharged to home.  Patient will need to follow up with primary care provider within one week of discharge, repeat BMP in one week. Follow up with cardiology, Dr. Stanford Breed. Patient should continue medications as prescribed.  Patient should follow a heart healthy diet.     Current Discharge Medication List    START taking these medications   Details  !! amiodarone (PACERONE) 200 MG tablet Take 1 tablet (200 mg total) by mouth daily. Start this dose on 06/15/2016 Qty: 30 tablet, Refills: 0    !! amiodarone (PACERONE) 400 MG tablet Take 1 tablet (400 mg total) by mouth 2 (two) times daily. Qty: 12 tablet, Refills: 0    apixaban (ELIQUIS) 5 MG TABS tablet Take 1 tablet (5 mg total) by mouth 2 (two) times daily. Qty: 60 tablet, Refills: 0    hydrALAZINE (APRESOLINE) 25 MG tablet Take 3 tablets (75 mg total) by mouth every 8 (eight) hours. Qty: 270 tablet, Refills: 0     !! - Potential duplicate medications found. Please discuss with provider.    CONTINUE these medications which have CHANGED   Details  labetalol (NORMODYNE) 100 MG tablet Take 1 tablet (100 mg total) by mouth 2 (two) times daily. Qty: 60 tablet, Refills: 0      CONTINUE these medications which have NOT CHANGED   Details  allopurinol (ZYLOPRIM) 100 MG tablet Take 200 mg by mouth daily. Gout-Right foot    amLODipine (NORVASC) 10 MG tablet Take 10 mg by mouth at bedtime.     atorvastatin (LIPITOR) 40 MG tablet Take 40 mg by mouth at bedtime.     calcitRIOL (ROCALTROL) 0.25 MCG capsule Take 0.25 mcg  by mouth daily.    famotidine (PEPCID) 20 MG tablet Take 20 mg by mouth daily.    fenofibrate 160 MG tablet Take 1 tablet by mouth at bedtime.     furosemide (LASIX) 80 MG tablet Take 80 mg by mouth 2 (two) times daily.  Refills: 5    oxyCODONE (ROXICODONE) 5 MG immediate release tablet Take 1 tablet (5 mg total) by mouth every 4 (four) hours as needed for pain. Qty: 30 tablet, Refills: 0    sertraline (ZOLOFT) 50 MG tablet Take 100 mg by mouth Daily.       STOP taking these medications     aspirin EC 325 MG tablet        Allergies  Allergen Reactions  . Penicillins Anaphylaxis and Other (See Comments)    Passed out  . Tetanus Toxoids Anaphylaxis  . Sulfa Antibiotics Other (See Comments)    Childhood reaction   Follow-up Information    Kirk Ruths, MD. Schedule an appointment as soon as possible for a visit in 1 week(s).   Specialty:  Cardiology Why:  Hospital follow up Contact information: [place] [address] [phone]        Simona Huh, MD. Schedule an appointment as soon as possible for a visit in 1 week(s).  Specialty:  Family Medicine Why:  Hospital follow up Contact information: [address] [place] [address] [phone]            The results of significant diagnostics from this hospitalization (including imaging, microbiology, ancillary and laboratory) are listed below for reference.    Significant Diagnostic Studies: Dg Chest 2 View  Result Date: 06/05/2016 CLINICAL DATA:  Chest pain EXAM: CHEST  2 VIEW COMPARISON:  Chest radiograph 10/14/2011 FINDINGS: The cardiac silhouette is mildly enlarged. There is atherosclerotic calcification in the aortic arch. No focal airspace consolidation or pulmonary edema. No pneumothorax or pleural effusion. IMPRESSION: 1. Mild cardiomegaly without overt pulmonary edema. 2. No focal airspace disease. Electronically Signed   By: Ulyses Jarred M.D.   On:  06/05/2016 00:50    Microbiology: Recent Results (from the past 240 hour(s))  Urine culture     Status: None   Collection Time: 06/05/16  3:28 AM  Result Value Ref Range Status   Specimen Description URINE, RANDOM  Final   Special Requests NONE  Final   Culture NO GROWTH  Final   Report Status 06/06/2016 FINAL  Final  Culture, blood (Routine X 2) w Reflex to ID Panel     Status: None (Preliminary result)   Collection Time: 06/05/16  4:34 AM  Result Value Ref Range Status   Specimen Description BLOOD RIGHT HAND  Final   Special Requests BOTTLES DRAWN AEROBIC AND ANAEROBIC 5ML  Final   Culture NO GROWTH 3 DAYS  Final   Report Status PENDING  Incomplete  Culture, blood (Routine X 2) w Reflex to ID Panel     Status: None (Preliminary result)   Collection Time: 06/05/16  4:48 AM  Result Value Ref Range Status   Specimen Description BLOOD RIGHT ARM  Final   Special Requests IN PEDIATRIC BOTTLE 3ML  Final   Culture NO GROWTH 3 DAYS  Final   Report Status PENDING  Incomplete     Labs: Basic Metabolic Panel:  Recent Labs Lab 06/04/16 2354 06/06/16 0525 06/07/16 0307 06/08/16 0317 06/09/16 0310 06/09/16 0817  NA 139 141 139 140 139  --   K 4.1 3.6 3.7 3.3* 3.3*  --   CL 104 105 104 102 104  --   CO2 22 24 24 24 23   --   GLUCOSE 130* 105* 122* 126* 121*  --   BUN 69* 67* 73* 71* 79*  --   CREATININE 3.97* 3.91* 3.72* 3.70* 3.83*  --   CALCIUM 8.6* 8.7* 8.6* 8.8* 8.6*  --   MG  --   --   --   --   --  2.5*   Liver Function Tests:  Recent Labs Lab 06/06/16 0525  AST 17  ALT 13*  ALKPHOS 35*  BILITOT 0.6  PROT 6.3*  ALBUMIN 3.1*   No results for input(s): LIPASE, AMYLASE in the last 168 hours. No results for input(s): AMMONIA in the last 168 hours. CBC:  Recent Labs Lab 06/04/16 2354 06/06/16 0525 06/07/16 0307 06/08/16 0317  WBC 13.6* 9.7 11.6* 13.1*  NEUTROABS  --  7.6  --   --   HGB 10.2* 9.2* 9.3* 9.5*  HCT 31.2* 28.7* 28.7* 29.5*  MCV 98.4 98.6 98.0  98.0  PLT 184 167 184 229   Cardiac Enzymes:  Recent Labs Lab 06/05/16 0337 06/05/16 0906 06/05/16 1458  TROPONINI <0.03 0.04* 0.05*   BNP: BNP (last 3 results) No results for input(s): BNP in the  last 8760 hours.  ProBNP (last 3 results) No results for input(s): PROBNP in the last 8760 hours.  CBG: No results for input(s): GLUCAP in the last 168 hours.     SignedCristal Ford  [facility] 06/09/2016, 10:31 AM

## 2016-06-10 LAB — CULTURE, BLOOD (ROUTINE X 2)
Culture: NO GROWTH
Culture: NO GROWTH

## 2016-06-11 ENCOUNTER — Telehealth: Payer: Self-pay | Admitting: Cardiology

## 2016-06-11 NOTE — Telephone Encounter (Signed)
Dr. Stanford Breed, advise if anything needed, as this patient has been w/o Eliquis ~5 days. I did instruct patient/family to use the free trial card they have on hand to fill this today.

## 2016-06-11 NOTE — Telephone Encounter (Signed)
MichelleLPN  informs me she spoke w pharmacy and confirmed this is for a Prior Authorization.  I called [facility] number given by caller. Spoke w Arma, who advised me to contact patient's other [facility] location [place] [phone]) to see if PA was initiated there.  No Eliquis samples on-hand in office.

## 2016-06-11 NOTE — Telephone Encounter (Signed)
Would begin apixaban as prescribed Kirk Ruths

## 2016-06-11 NOTE — Telephone Encounter (Signed)
[name] ( daughter) is calling because Troy [name] is needing a Pre-Auth for Eliquis . He has not had any since Saturday when he was discharged .  Pharmacy is [facility] on [address] . Ph# [phone]. Please call if you have any questions

## 2016-06-11 NOTE — Telephone Encounter (Signed)
Spoke to [place] at [employer]. He is faxing over sheet w info for PA enrollment. Also states they can provide 2-3 days worth of med if pt has no resources but to call and confirm they received a trial card for the med. I called pt who had me speak w wife, she confirmed they got a free trial card for Eliquis but have not gotten filled out. I have asked her to contact pharmacy so that they can run the card and get patient his medication. She states she will do this today. Gave her number for [facility] at [place].

## 2016-06-12 NOTE — Telephone Encounter (Signed)
PA submitted 06/12/16. Pending review. Key: [id number]

## 2016-06-14 ENCOUNTER — Encounter: Payer: Self-pay | Admitting: Student

## 2016-06-14 ENCOUNTER — Ambulatory Visit (INDEPENDENT_AMBULATORY_CARE_PROVIDER_SITE_OTHER): Payer: Medicare Other | Admitting: Student

## 2016-06-14 VITALS — BP 136/51 | HR 45 | Ht 67.0 in | Wt 185.8 lb

## 2016-06-14 DIAGNOSIS — N185 Chronic kidney disease, stage 5: Secondary | ICD-10-CM

## 2016-06-14 DIAGNOSIS — I480 Paroxysmal atrial fibrillation: Secondary | ICD-10-CM | POA: Diagnosis not present

## 2016-06-14 DIAGNOSIS — I5032 Chronic diastolic (congestive) heart failure: Secondary | ICD-10-CM

## 2016-06-14 DIAGNOSIS — R0789 Other chest pain: Secondary | ICD-10-CM

## 2016-06-14 DIAGNOSIS — D649 Anemia, unspecified: Secondary | ICD-10-CM

## 2016-06-14 DIAGNOSIS — R5381 Other malaise: Secondary | ICD-10-CM

## 2016-06-14 DIAGNOSIS — E782 Mixed hyperlipidemia: Secondary | ICD-10-CM

## 2016-06-14 LAB — BASIC METABOLIC PANEL
BUN: 98 mg/dL — AB (ref 7–25)
CALCIUM: 8.5 mg/dL — AB (ref 8.6–10.3)
CO2: 25 mmol/L (ref 20–31)
CREATININE: 4.32 mg/dL — AB (ref 0.70–1.18)
Chloride: 104 mmol/L (ref 98–110)
GLUCOSE: 106 mg/dL — AB (ref 65–99)
Potassium: 4.4 mmol/L (ref 3.5–5.3)
Sodium: 141 mmol/L (ref 135–146)

## 2016-06-14 LAB — CBC
HEMATOCRIT: 32.2 % — AB (ref 38.5–50.0)
HEMOGLOBIN: 10.5 g/dL — AB (ref 13.2–17.1)
MCH: 31.5 pg (ref 27.0–33.0)
MCHC: 32.6 g/dL (ref 32.0–36.0)
MCV: 96.7 fL (ref 80.0–100.0)
MPV: 9.7 fL (ref 7.5–12.5)
Platelets: 327 10*3/uL (ref 140–400)
RBC: 3.33 MIL/uL — ABNORMAL LOW (ref 4.20–5.80)
RDW: 16 % — ABNORMAL HIGH (ref 11.0–15.0)
WBC: 11.2 10*3/uL — ABNORMAL HIGH (ref 3.8–10.8)

## 2016-06-14 MED ORDER — LABETALOL HCL 100 MG PO TABS
50.0000 mg | ORAL_TABLET | Freq: Two times a day (BID) | ORAL | 2 refills | Status: DC
Start: 2016-06-14 — End: 2016-07-20

## 2016-06-14 MED ORDER — AMIODARONE HCL 200 MG PO TABS: 200.0000 mg | ORAL_TABLET | Freq: Every day | ORAL | 10 refills | Status: DC
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
## 2016-06-14 NOTE — Patient Instructions (Addendum)
Medication Instructions:  START Pacerone 200mg  Take 1 tablet by mouth once a day DECREASE Labetalol to 50mg  Take half (100mg ) tablet twice a day  Labwork: Your physician recommends that you return for lab work in: [facility], [facility]  Testing/Procedures: None   Follow-Up: Your physician recommends that you schedule a follow-up appointment in: [place].  Any Other Special Instructions Will Be Listed Below (If Applicable).  You have been referred to [facility]  If you need a refill on your cardiac medications before your next appointment, please call your pharmacy.

## 2016-06-14 NOTE — Telephone Encounter (Signed)
Paperwork for eliquis PA faxed to [employer]

## 2016-06-15 NOTE — Telephone Encounter (Signed)
eliquis approved through 07/08/17. [id number]

## 2016-06-17 DIAGNOSIS — M6281 Muscle weakness (generalized): Secondary | ICD-10-CM | POA: Diagnosis not present

## 2016-06-18 ENCOUNTER — Encounter (HOSPITAL_COMMUNITY)
Admission: RE | Admit: 2016-06-18 | Discharge: 2016-06-18 | Disposition: A | Discharge status: Home / Self Care | Payer: Medicare Other | Source: Ambulatory Visit | Attending: Nephrology | Admitting: Nephrology

## 2016-06-18 DIAGNOSIS — N184 Chronic kidney disease, stage 4 (severe): Secondary | ICD-10-CM | POA: Insufficient documentation

## 2016-06-18 DIAGNOSIS — D631 Anemia in chronic kidney disease: Secondary | ICD-10-CM | POA: Insufficient documentation

## 2016-06-18 DIAGNOSIS — N185 Chronic kidney disease, stage 5: Secondary | ICD-10-CM

## 2016-06-18 LAB — POCT HEMOGLOBIN-HEMACUE: Hemoglobin: 11.1 g/dL — ABNORMAL LOW (ref 13.0–17.0)

## 2016-06-18 MED ORDER — EPOETIN ALFA 20000 UNIT/ML IJ SOLN
20000.0000 [IU] | INTRAMUSCULAR | Status: DC
  Administered 2016-06-18: 20000 [IU] via SUBCUTANEOUS

## 2016-06-18 MED ORDER — EPOETIN ALFA 20000 UNIT/ML IJ SOLN
INTRAMUSCULAR | Status: AC
  Filled 2016-06-18: qty 1

## 2016-07-08 ENCOUNTER — Other Ambulatory Visit: Payer: Self-pay | Admitting: Cardiology

## 2016-07-13 ENCOUNTER — Other Ambulatory Visit (HOSPITAL_COMMUNITY): Payer: Self-pay | Admitting: *Deleted

## 2016-07-16 ENCOUNTER — Encounter (HOSPITAL_COMMUNITY): Payer: Medicare Other
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
## 2016-07-20 ENCOUNTER — Ambulatory Visit (INDEPENDENT_AMBULATORY_CARE_PROVIDER_SITE_OTHER): Payer: Medicare Other | Admitting: Cardiology

## 2016-07-20 ENCOUNTER — Encounter: Payer: Self-pay | Admitting: Cardiology

## 2016-07-20 VITALS — BP 122/64 | HR 56 | Ht 67.75 in | Wt 185.0 lb

## 2016-07-20 DIAGNOSIS — R072 Precordial pain: Secondary | ICD-10-CM | POA: Diagnosis not present

## 2016-07-20 DIAGNOSIS — I679 Cerebrovascular disease, unspecified: Secondary | ICD-10-CM

## 2016-07-20 DIAGNOSIS — I480 Paroxysmal atrial fibrillation: Secondary | ICD-10-CM | POA: Diagnosis not present

## 2016-07-20 DIAGNOSIS — I4891 Unspecified atrial fibrillation: Secondary | ICD-10-CM | POA: Diagnosis not present

## 2016-07-20 NOTE — Patient Instructions (Addendum)
Medication Instructions:   STOP LABETALOL   Testing/Procedures:  Your physician has requested that you have a lexiscan myoview. For further information please visit [email or web address]. Please follow instruction sheet, as given.   Your physician has requested that you have a carotid duplex. This test is an ultrasound of the carotid arteries in your neck. It looks at blood flow through these arteries that supply the brain with blood. Allow one hour for this exam. There are no restrictions or special instructions.    Follow-Up:  Your physician wants you to follow-up in: [place] will receive a reminder letter in the mail two months in advance. If you don't receive a letter, please call our office to schedule the follow-up appointment.

## 2016-07-27 ENCOUNTER — Telehealth (HOSPITAL_COMMUNITY): Payer: Self-pay

## 2016-07-27 NOTE — Telephone Encounter (Signed)
Encounter complete. 

## 2016-08-01 ENCOUNTER — Ambulatory Visit (HOSPITAL_COMMUNITY)
Admission: RE | Admit: 2016-08-01 | Discharge: 2016-08-01 | Disposition: A | Discharge status: Home / Self Care | Payer: Medicare Other | Source: Ambulatory Visit | Attending: Cardiovascular Disease | Admitting: Cardiovascular Disease

## 2016-08-01 DIAGNOSIS — I509 Heart failure, unspecified: Secondary | ICD-10-CM | POA: Insufficient documentation

## 2016-08-01 DIAGNOSIS — I2510 Atherosclerotic heart disease of native coronary artery without angina pectoris: Secondary | ICD-10-CM | POA: Insufficient documentation

## 2016-08-01 DIAGNOSIS — Z8249 Family history of ischemic heart disease and other diseases of the circulatory system: Secondary | ICD-10-CM | POA: Insufficient documentation

## 2016-08-01 DIAGNOSIS — E785 Hyperlipidemia, unspecified: Secondary | ICD-10-CM | POA: Insufficient documentation

## 2016-08-01 DIAGNOSIS — R072 Precordial pain: Secondary | ICD-10-CM | POA: Insufficient documentation

## 2016-08-01 DIAGNOSIS — I679 Cerebrovascular disease, unspecified: Secondary | ICD-10-CM | POA: Insufficient documentation

## 2016-08-01 DIAGNOSIS — Z87891 Personal history of nicotine dependence: Secondary | ICD-10-CM | POA: Diagnosis not present

## 2016-08-01 DIAGNOSIS — I130 Hypertensive heart and chronic kidney disease with heart failure and stage 1 through stage 4 chronic kidney disease, or unspecified chronic kidney disease: Secondary | ICD-10-CM | POA: Insufficient documentation

## 2016-08-01 DIAGNOSIS — N189 Chronic kidney disease, unspecified: Secondary | ICD-10-CM | POA: Insufficient documentation

## 2016-08-01 DIAGNOSIS — I480 Paroxysmal atrial fibrillation: Secondary | ICD-10-CM | POA: Insufficient documentation

## 2016-08-01 LAB — MYOCARDIAL PERFUSION IMAGING
CHL CUP NUCLEAR SSS: 15
CHL CUP RESTING HR STRESS: 73 {beats}/min
CHL CUP STRESS STAGE 2 SPEED: 0 mph
CHL CUP STRESS STAGE 3 HR: 77 {beats}/min
CHL CUP STRESS STAGE 3 SPEED: 0 mph
CHL CUP STRESS STAGE 4 DBP: 62 mmHg
CHL CUP STRESS STAGE 4 GRADE: 0 %
CHL CUP STRESS STAGE 4 SBP: 177 mmHg
CHL CUP STRESS STAGE 4 SPEED: 0 mph
CSEPEW: 1 METS
CSEPPHR: 77 {beats}/min
LVDIAVOL: 155 mL (ref 62–150)
LVSYSVOL: 71 mL
NUC STRESS TID: 1.28
Percent of predicted max HR: 53 %
SDS: 9
SRS: 6
Stage 1 DBP: 79 mmHg
Stage 1 Grade: 0 %
Stage 1 HR: 71 {beats}/min
Stage 1 SBP: 140 mmHg
Stage 1 Speed: 0 mph
Stage 2 Grade: 0 %
Stage 2 HR: 71 {beats}/min
Stage 3 Grade: 0 %
Stage 4 HR: 76 {beats}/min

## 2016-08-01 IMAGING — NM NM MISC PROCEDURE
6 series · 36 of 36 positions shown · non-contrast
Comparison: none

[Series 1: wbr_r-proj_st wbr rest · 6.40mm/px · 6 of 64 frames shown]
[frame 6/64]
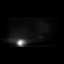
[frame 16/64]
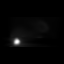
[frame 27/64]
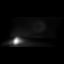
[frame 38/64]
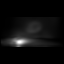
[frame 48/64]
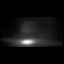
[frame 59/64]
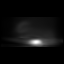

[Series 1: wbr rest · 6.40mm/px · 6 of 64 frames shown]
[frame 6/64]
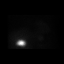
[frame 16/64]
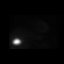
[frame 27/64]
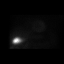
[frame 38/64]
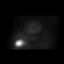
[frame 48/64]
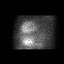
[frame 59/64]
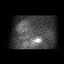

[Series 2: wbr_s-proj_st wbr stress-gsp · 6.40mm/px · 6 of 512 frames shown]
[frame 43/512]
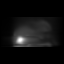
[frame 128/512]
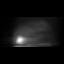
[frame 214/512]
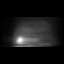
[frame 299/512]
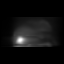
[frame 384/512]
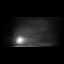
[frame 470/512]
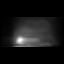

[Series 2: wbr stress-gsp · 6.40mm/px · 6 of 512 frames shown]
[frame 43/512]
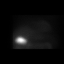
[frame 128/512]
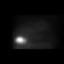
[frame 214/512]
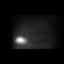
[frame 299/512]
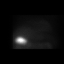
[frame 384/512]
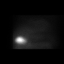
[frame 470/512]
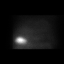

[Series 3: wbr stress-sum-em · 6.40mm/px · 6 of 64 frames shown]
[frame 6/64]
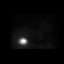
[frame 16/64]
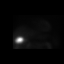
[frame 27/64]
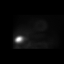
[frame 38/64]
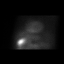
[frame 48/64]
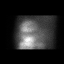
[frame 59/64]
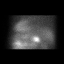

[Series 3: wbr_s-proj_st wbr stress-sum-em · 6.40mm/px · 6 of 64 frames shown]
[frame 6/64]
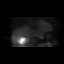
[frame 16/64]
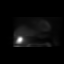
[frame 27/64]
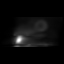
[frame 38/64]
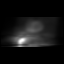
[frame 48/64]
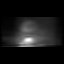
[frame 59/64]
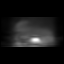

[36 of 36 positions shown; findings below may reference images not displayed]

Canned report from images found in remote index.

Refer to host system for actual result text.

## 2016-08-01 MED ORDER — TECHNETIUM TC 99M TETROFOSMIN IV KIT
32.0000 | PACK | Freq: Once | INTRAVENOUS | Status: AC | PRN
  Administered 2016-08-01: 32 via INTRAVENOUS
  Filled 2016-08-01: qty 32

## 2016-08-01 MED ORDER — REGADENOSON 0.4 MG/5ML IV SOLN
0.4000 mg | Freq: Once | INTRAVENOUS | Status: AC
  Administered 2016-08-01: 0.4 mg via INTRAVENOUS

## 2016-08-01 MED ORDER — TECHNETIUM TC 99M TETROFOSMIN IV KIT
10.2000 | PACK | Freq: Once | INTRAVENOUS | Status: AC | PRN
  Administered 2016-08-01: 10.2 via INTRAVENOUS
  Filled 2016-08-01: qty 11

## 2016-08-06 ENCOUNTER — Other Ambulatory Visit: Payer: Self-pay | Admitting: Student

## 2016-08-10 ENCOUNTER — Ambulatory Visit: Payer: Medicare Other | Admitting: Physician Assistant

## 2016-08-15 ENCOUNTER — Encounter (HOSPITAL_COMMUNITY)
Admission: RE | Admit: 2016-08-15 | Discharge: 2016-08-15 | Disposition: A | Discharge status: Home / Self Care | Payer: Medicare Other | Source: Ambulatory Visit | Attending: Nephrology | Admitting: Nephrology

## 2016-08-15 DIAGNOSIS — N184 Chronic kidney disease, stage 4 (severe): Secondary | ICD-10-CM | POA: Diagnosis not present

## 2016-08-15 DIAGNOSIS — N185 Chronic kidney disease, stage 5: Secondary | ICD-10-CM

## 2016-08-15 DIAGNOSIS — D631 Anemia in chronic kidney disease: Secondary | ICD-10-CM | POA: Insufficient documentation

## 2016-08-15 LAB — IRON AND TIBC
IRON: 68 ug/dL (ref 45–182)
SATURATION RATIOS: 20 % (ref 17.9–39.5)
TIBC: 342 ug/dL (ref 250–450)
UIBC: 274 ug/dL

## 2016-08-15 LAB — POCT HEMOGLOBIN-HEMACUE: HEMOGLOBIN: 9.6 g/dL — AB (ref 13.0–17.0)

## 2016-08-15 LAB — FERRITIN: FERRITIN: 1230 ng/mL — AB (ref 24–336)

## 2016-08-15 MED ORDER — EPOETIN ALFA 20000 UNIT/ML IJ SOLN
INTRAMUSCULAR | Status: AC
Start: 2016-08-15 — End: 2016-08-15
  Filled 2016-08-15: qty 1

## 2016-08-15 MED ORDER — EPOETIN ALFA 20000 UNIT/ML IJ SOLN
20000.0000 [IU] | INTRAMUSCULAR | Status: DC
  Administered 2016-08-15: 10:00:00 20000 [IU] via SUBCUTANEOUS

## 2016-08-17 ENCOUNTER — Ambulatory Visit: Payer: Medicare Other | Admitting: Physician Assistant

## 2016-08-24 ENCOUNTER — Encounter: Payer: Self-pay | Admitting: Physician Assistant

## 2016-08-24 ENCOUNTER — Other Ambulatory Visit: Payer: Self-pay | Admitting: Physician Assistant

## 2016-08-24 ENCOUNTER — Ambulatory Visit (INDEPENDENT_AMBULATORY_CARE_PROVIDER_SITE_OTHER): Payer: Medicare Other | Admitting: Physician Assistant

## 2016-08-24 VITALS — BP 140/62 | HR 71 | Ht 68.0 in | Wt 178.0 lb

## 2016-08-24 DIAGNOSIS — E785 Hyperlipidemia, unspecified: Secondary | ICD-10-CM

## 2016-08-24 DIAGNOSIS — N185 Chronic kidney disease, stage 5: Secondary | ICD-10-CM | POA: Diagnosis not present

## 2016-08-24 DIAGNOSIS — I10 Essential (primary) hypertension: Secondary | ICD-10-CM | POA: Diagnosis not present

## 2016-08-24 DIAGNOSIS — I5032 Chronic diastolic (congestive) heart failure: Secondary | ICD-10-CM

## 2016-08-24 DIAGNOSIS — I480 Paroxysmal atrial fibrillation: Secondary | ICD-10-CM

## 2016-08-24 DIAGNOSIS — Z8673 Personal history of transient ischemic attack (TIA), and cerebral infarction without residual deficits: Secondary | ICD-10-CM

## 2016-08-24 DIAGNOSIS — R9439 Abnormal result of other cardiovascular function study: Secondary | ICD-10-CM | POA: Diagnosis not present

## 2016-08-24 MED ORDER — NITROGLYCERIN 0.4 MG SL SUBL: 0.4000 mg | SUBLINGUAL_TABLET | SUBLINGUAL | 3 refills | Status: DC | PRN

## 2016-08-24 MED ORDER — ISOSORBIDE MONONITRATE ER 30 MG PO TB24: 30.0000 mg | ORAL_TABLET | Freq: Every day | ORAL | 3 refills | Status: DC

## 2016-08-24 NOTE — Telephone Encounter (Signed)
90 day refill request for NTG refused

## 2016-08-24 NOTE — Telephone Encounter (Signed)
Please review for refill. Thanks!  
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
## 2016-08-24 NOTE — Patient Instructions (Addendum)
Your physician has recommended you make the following change in your medication:  -- take NITROGLYCERIN as needed for chest pain  -- dissolve 1 tab under tongue every 5 minutes (up to 3 doses)   -- if chest pain does not resolve after 3 doses, GO TO HOSPITAL -- START ISOSORBIDE (Imdur) 30mg  once daily -- take an extra dose of LASIX today then go back to twice daily dose  Your physician recommends that you schedule a follow-up appointment in: 2 months with Dr. Stanford Breed

## 2016-08-28 ENCOUNTER — Other Ambulatory Visit: Payer: Self-pay | Admitting: Physician Assistant

## 2016-08-28 NOTE — Telephone Encounter (Signed)
Please review for refill. Thanks!  

## 2016-09-12 ENCOUNTER — Encounter (HOSPITAL_COMMUNITY)
Admission: RE | Admit: 2016-09-12 | Discharge: 2016-09-12 | Disposition: A | Discharge status: Home / Self Care | Payer: Medicare Other | Source: Ambulatory Visit | Attending: Nephrology | Admitting: Nephrology

## 2016-09-12 DIAGNOSIS — N184 Chronic kidney disease, stage 4 (severe): Secondary | ICD-10-CM | POA: Insufficient documentation

## 2016-09-12 DIAGNOSIS — D631 Anemia in chronic kidney disease: Secondary | ICD-10-CM | POA: Insufficient documentation

## 2016-09-12 DIAGNOSIS — N185 Chronic kidney disease, stage 5: Secondary | ICD-10-CM

## 2016-09-12 LAB — POCT HEMOGLOBIN-HEMACUE: Hemoglobin: 9.9 g/dL — ABNORMAL LOW (ref 13.0–17.0)

## 2016-09-12 MED ORDER — EPOETIN ALFA 20000 UNIT/ML IJ SOLN
INTRAMUSCULAR | Status: AC
  Filled 2016-09-12: qty 1

## 2016-09-12 MED ORDER — EPOETIN ALFA 20000 UNIT/ML IJ SOLN
20000.0000 [IU] | INTRAMUSCULAR | Status: DC
  Administered 2016-09-12: 10:00:00 20000 [IU] via SUBCUTANEOUS

## 2016-10-06 ENCOUNTER — Telehealth: Payer: Self-pay | Admitting: Physician Assistant

## 2016-10-06 ENCOUNTER — Other Ambulatory Visit: Payer: Self-pay | Admitting: Physician Assistant

## 2016-10-06 MED ORDER — HYDRALAZINE HCL 50 MG PO TABS: 50.0000 mg | ORAL_TABLET | Freq: Three times a day (TID) | ORAL | 1 refills | Status: DC

## 2016-10-06 NOTE — Telephone Encounter (Signed)
Error. Loreda Silverio PA-C

## 2016-10-06 NOTE — Telephone Encounter (Signed)
Patient's daughter called answering service, requested refill on hydralazine.  Last listed as 25mg  3 tablets TID.  I asked them to clarify what he was taking, and suddenly they realized he was taking it wrong - the daughter confirmed with the patient that he's only been taking 1 tablet (25mg ) TID. They did not realize it was supposed to be 3 tablets each dose.   Imdur was added at last visit 08/2016 due to elevated BP.  I had her check his BP - 154/67. Had already taken usual meds. Will meet in the middle and increase dose to 50mg  TID for now. I clarified this would be a NEW DOSE and he would continue taking 1 tablet TID.  I asked them to monitor blood pressure daily at home and call if getting readings of greater than 130 on the top number or any low readings. Daughter verbalized understand and gratitude.  Hydralazine sent in as below.  Mitsuko Luera PA-C

## 2016-10-08 ENCOUNTER — Other Ambulatory Visit: Payer: Self-pay | Admitting: Cardiology

## 2016-10-08 NOTE — Telephone Encounter (Signed)
Rx request sent to pharmacy.  

## 2016-10-10 ENCOUNTER — Ambulatory Visit (HOSPITAL_COMMUNITY)
Admission: RE | Admit: 2016-10-10 | Discharge: 2016-10-10 | Disposition: A | Discharge status: Home / Self Care | Payer: Medicare Other | Source: Ambulatory Visit | Attending: Nephrology | Admitting: Nephrology

## 2016-10-10 DIAGNOSIS — N184 Chronic kidney disease, stage 4 (severe): Secondary | ICD-10-CM | POA: Diagnosis not present

## 2016-10-10 DIAGNOSIS — N185 Chronic kidney disease, stage 5: Secondary | ICD-10-CM

## 2016-10-10 DIAGNOSIS — D631 Anemia in chronic kidney disease: Secondary | ICD-10-CM | POA: Diagnosis present

## 2016-10-10 LAB — POCT HEMOGLOBIN-HEMACUE: HEMOGLOBIN: 10.9 g/dL — AB (ref 13.0–17.0)

## 2016-10-10 MED ORDER — EPOETIN ALFA 20000 UNIT/ML IJ SOLN
20000.0000 [IU] | INTRAMUSCULAR | Status: DC
  Administered 2016-10-10: 10:00:00 20000 [IU] via SUBCUTANEOUS

## 2016-10-10 MED ORDER — EPOETIN ALFA 20000 UNIT/ML IJ SOLN
INTRAMUSCULAR | Status: AC
  Filled 2016-10-10: qty 1

## 2016-10-15 ENCOUNTER — Encounter: Payer: Self-pay | Admitting: Cardiology
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
## 2016-11-01 ENCOUNTER — Ambulatory Visit (INDEPENDENT_AMBULATORY_CARE_PROVIDER_SITE_OTHER): Payer: Medicare Other | Admitting: Cardiology

## 2016-11-01 ENCOUNTER — Encounter: Payer: Self-pay | Admitting: Cardiology

## 2016-11-01 VITALS — BP 134/60 | HR 70 | Ht 68.0 in | Wt 180.2 lb

## 2016-11-01 DIAGNOSIS — I5032 Chronic diastolic (congestive) heart failure: Secondary | ICD-10-CM | POA: Diagnosis not present

## 2016-11-01 DIAGNOSIS — I4891 Unspecified atrial fibrillation: Secondary | ICD-10-CM

## 2016-11-01 DIAGNOSIS — E7800 Pure hypercholesterolemia, unspecified: Secondary | ICD-10-CM

## 2016-11-01 DIAGNOSIS — R9439 Abnormal result of other cardiovascular function study: Secondary | ICD-10-CM | POA: Diagnosis not present

## 2016-11-01 NOTE — Patient Instructions (Signed)
Medication Instructions:   NO CHANGE  Labwork:  Your physician recommends that you LAB WORK TODAY= [facility]  Testing/Procedures:  A chest x-ray takes a picture of the organs and structures inside the chest, including the heart, lungs, and blood vessels. This test can show several things, including, whether the heart is enlarges; whether fluid is building up in the lungs; and whether pacemaker / defibrillator leads are still in place.   Follow-Up:  Your physician wants you to follow-up in: [place] will receive a reminder letter in the mail two months in advance. If you don't receive a letter, please call our office to schedule the follow-up appointment.   If you need a refill on your cardiac medications before your next appointment, please call your pharmacy.

## 2016-11-07 ENCOUNTER — Ambulatory Visit (HOSPITAL_COMMUNITY)
Admission: RE | Admit: 2016-11-07 | Discharge: 2016-11-07 | Disposition: A | Discharge status: Home / Self Care | Payer: Medicare Other | Source: Ambulatory Visit | Attending: Nephrology | Admitting: Nephrology

## 2016-11-07 DIAGNOSIS — N184 Chronic kidney disease, stage 4 (severe): Secondary | ICD-10-CM | POA: Insufficient documentation

## 2016-11-07 DIAGNOSIS — N185 Chronic kidney disease, stage 5: Secondary | ICD-10-CM

## 2016-11-07 DIAGNOSIS — D631 Anemia in chronic kidney disease: Secondary | ICD-10-CM | POA: Insufficient documentation

## 2016-11-07 LAB — IRON AND TIBC
Iron: 97 ug/dL (ref 45–182)
SATURATION RATIOS: 28 % (ref 17.9–39.5)
TIBC: 350 ug/dL (ref 250–450)
UIBC: 253 ug/dL

## 2016-11-07 LAB — FERRITIN: FERRITIN: 1222 ng/mL — AB (ref 24–336)

## 2016-11-07 LAB — POCT HEMOGLOBIN-HEMACUE: HEMOGLOBIN: 11.4 g/dL — AB (ref 13.0–17.0)

## 2016-11-07 MED ORDER — EPOETIN ALFA 20000 UNIT/ML IJ SOLN
INTRAMUSCULAR | Status: AC
  Administered 2016-11-07: 20000 [IU] via SUBCUTANEOUS
  Filled 2016-11-07: qty 1

## 2016-11-07 MED ORDER — EPOETIN ALFA 20000 UNIT/ML IJ SOLN
20000.0000 [IU] | INTRAMUSCULAR | Status: DC
  Administered 2016-11-07: 20000 [IU] via SUBCUTANEOUS

## 2016-11-12 ENCOUNTER — Ambulatory Visit (HOSPITAL_COMMUNITY)
Admission: RE | Admit: 2016-11-12 | Discharge: 2016-11-12 | Disposition: A | Discharge status: Home / Self Care | Payer: Medicare Other | Source: Ambulatory Visit | Attending: Cardiology | Admitting: Cardiology

## 2016-11-12 DIAGNOSIS — I4891 Unspecified atrial fibrillation: Secondary | ICD-10-CM | POA: Diagnosis present

## 2016-11-12 DIAGNOSIS — I700 Atherosclerosis of aorta: Secondary | ICD-10-CM | POA: Diagnosis not present

## 2016-11-12 IMAGING — CR DG CHEST 2V
2 series · 2 of 2 positions shown · non-contrast
Comparison: [DATE]

CLINICAL DATA: Atrial fibrillation.  Hypertension.

EXAM:
CHEST  2 VIEW

[w chest pa]
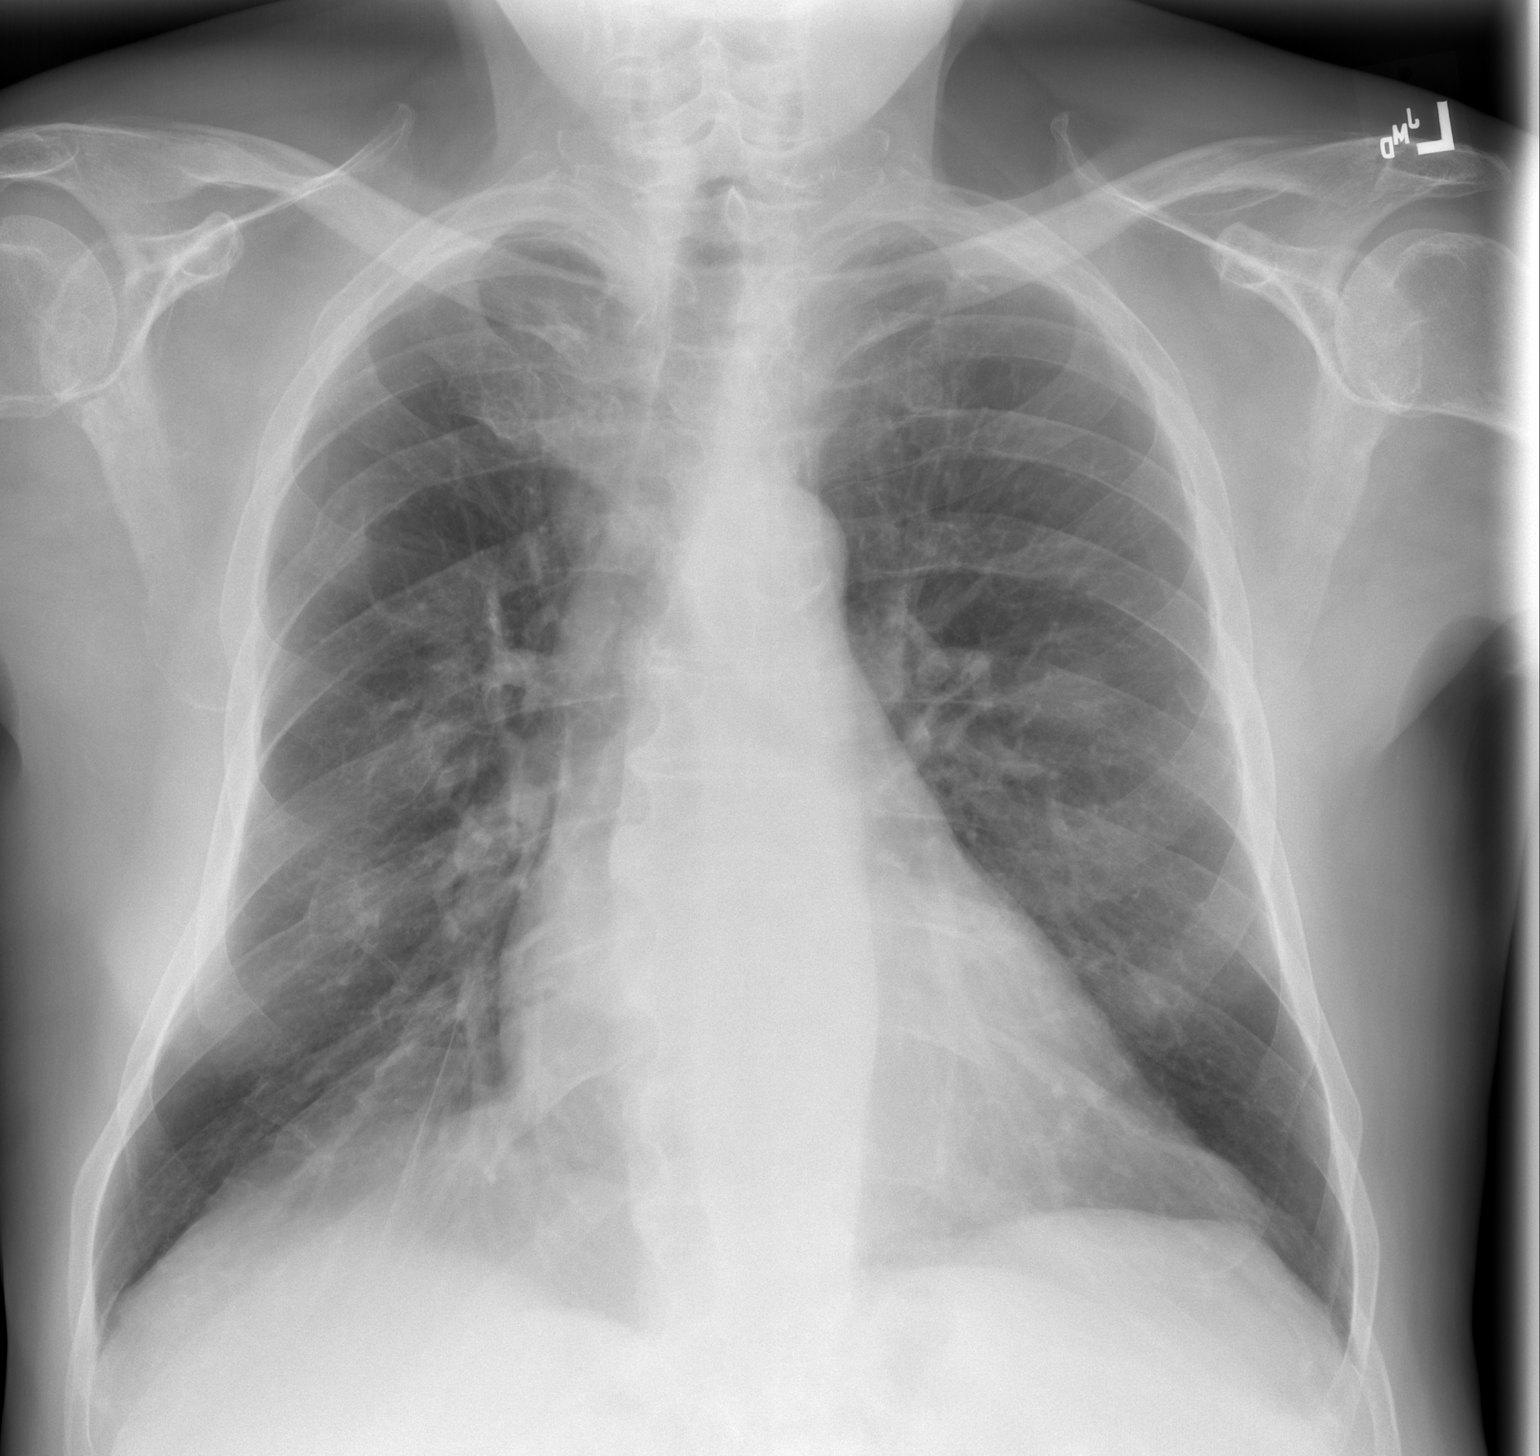

[w chest lat]
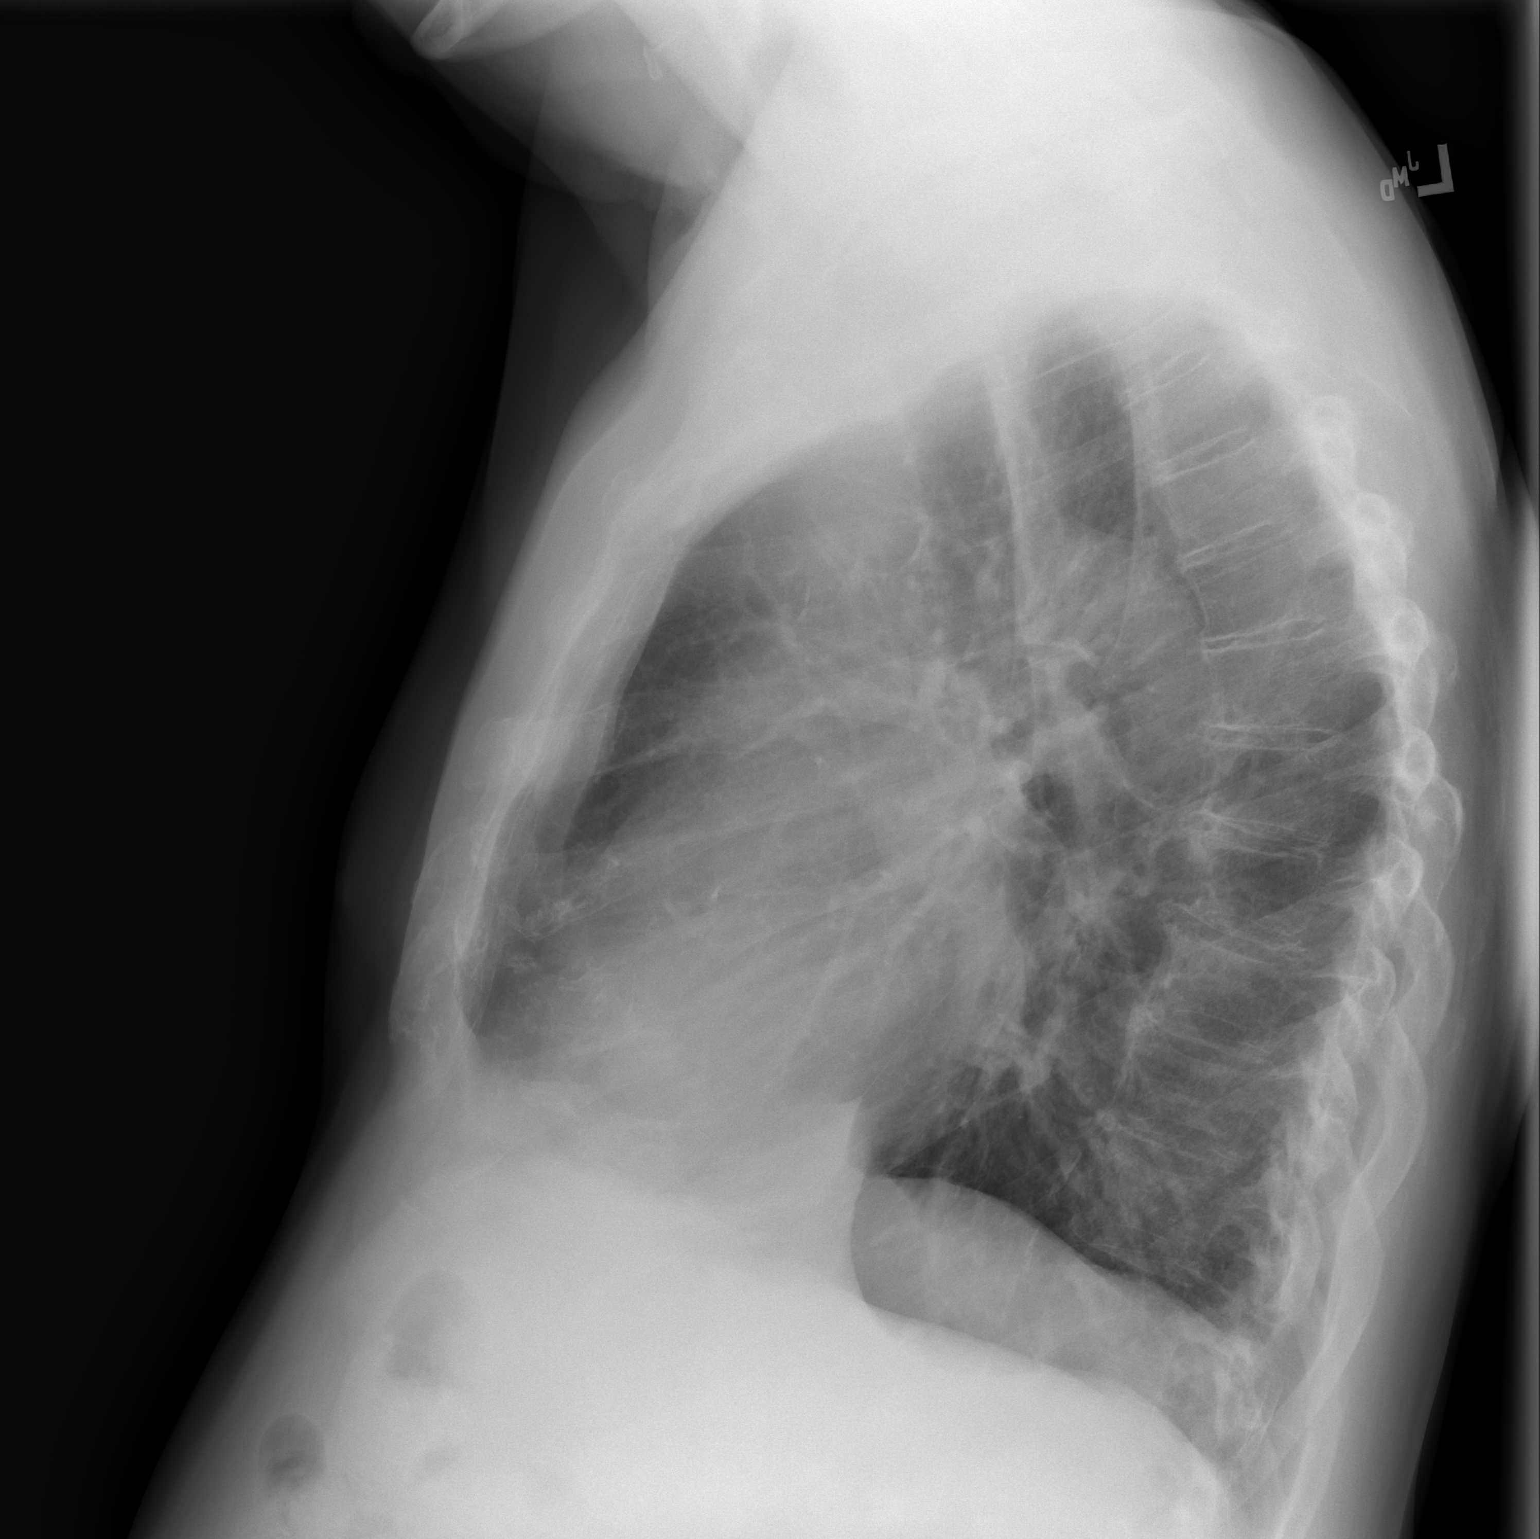

[2 of 2 positions shown; findings below may reference images not displayed]

FINDINGS: There is no edema or consolidation. Heart size and pulmonary
vascularity are normal. No adenopathy. There is aortic
atherosclerosis. There old healed rib fractures on the right as well
as degenerative change in the thoracic spine.
IMPRESSION: Aortic atherosclerosis.  No edema or consolidation.

## 2016-11-13 ENCOUNTER — Telehealth: Payer: Self-pay | Admitting: *Deleted

## 2016-11-13 DIAGNOSIS — Z79899 Other long term (current) drug therapy: Secondary | ICD-10-CM

## 2016-11-13 LAB — CBC
Hematocrit: 30.9 % — ABNORMAL LOW (ref 37.5–51.0)
Hemoglobin: 10.4 g/dL — ABNORMAL LOW (ref 13.0–17.7)
MCH: 31.1 pg (ref 26.6–33.0)
MCHC: 33.7 g/dL (ref 31.5–35.7)
MCV: 93 fL (ref 79–97)
PLATELETS: 303 10*3/uL (ref 150–379)
RBC: 3.34 x10E6/uL — AB (ref 4.14–5.80)
RDW: 17 % — ABNORMAL HIGH (ref 12.3–15.4)
WBC: 9.5 10*3/uL (ref 3.4–10.8)

## 2016-11-13 LAB — HEPATIC FUNCTION PANEL
ALBUMIN: 4 g/dL (ref 3.5–4.8)
ALT: 48 IU/L — AB (ref 0–44)
AST: 69 IU/L — ABNORMAL HIGH (ref 0–40)
Alkaline Phosphatase: 51 IU/L (ref 39–117)
BILIRUBIN TOTAL: 0.4 mg/dL (ref 0.0–1.2)
BILIRUBIN, DIRECT: 0.21 mg/dL (ref 0.00–0.40)
TOTAL PROTEIN: 6.8 g/dL (ref 6.0–8.5)

## 2016-11-13 LAB — TSH: TSH: 6.02 u[IU]/mL — ABNORMAL HIGH (ref 0.450–4.500)

## 2016-11-13 NOTE — Telephone Encounter (Addendum)
-----   Message from Lelon Perla, MD sent at 11/13/2016  7:18 AM EDT ----- DC fenofibrate; change lipitor to 20 mg daily; LFTS, TSH, Free T4 in 6 weeks Kirk Ruths  Left message for pt to call

## 2016-11-14 NOTE — Telephone Encounter (Signed)
Follow up    Pt is returning call to Rn.

## 2016-11-14 NOTE — Telephone Encounter (Signed)
Unable to leave message

## 2016-11-14 NOTE — Telephone Encounter (Signed)
°  New Message ° ° pt verbalized that he is calling for rn  °

## 2016-11-14 NOTE — Telephone Encounter (Signed)
[id number] 

## 2016-11-15 NOTE — Telephone Encounter (Signed)
Pt notified. Labs ordered.

## 2016-11-21 ENCOUNTER — Telehealth: Payer: Self-pay | Admitting: *Deleted

## 2016-11-21 ENCOUNTER — Encounter: Payer: Self-pay | Admitting: *Deleted

## 2016-11-21 NOTE — Telephone Encounter (Addendum)
-----   Message from Lelon Perla, MD sent at 11/13/2016  7:18 AM EDT ----- DC fenofibrate; change lipitor to 20 mg daily; LFTS, TSH, Free T4 in 6 weeks Kirk Ruths  Unable to reach pt or leave a message

## 2016-11-26 NOTE — Telephone Encounter (Signed)
Letter mailed to the patient requesting he call.

## 2016-11-28 ENCOUNTER — Telehealth: Payer: Self-pay | Admitting: Cardiology

## 2016-11-28 DIAGNOSIS — R945 Abnormal results of liver function studies: Principal | ICD-10-CM

## 2016-11-28 DIAGNOSIS — R7989 Other specified abnormal findings of blood chemistry: Secondary | ICD-10-CM

## 2016-11-28 MED ORDER — ATORVASTATIN CALCIUM 40 MG PO TABS
20.0000 mg | ORAL_TABLET | Freq: Every day | ORAL | Status: DC
Start: 2016-11-28 — End: 2019-12-10

## 2016-11-28 NOTE — Telephone Encounter (Signed)
Please call,concerning the letter he received from you concerning his medicine.

## 2016-11-28 NOTE — Telephone Encounter (Signed)
Spoke with pt, aware of lab results and medication change. Lab orders mailed to the pt

## 2016-11-28 NOTE — Telephone Encounter (Signed)
See telephone note from 11-13-16.

## 2016-12-05 ENCOUNTER — Ambulatory Visit (HOSPITAL_COMMUNITY)
Admission: RE | Admit: 2016-12-05 | Discharge: 2016-12-05 | Disposition: A | Discharge status: Home / Self Care | Payer: Medicare Other | Source: Ambulatory Visit | Attending: Nephrology | Admitting: Nephrology

## 2016-12-05 DIAGNOSIS — Z5181 Encounter for therapeutic drug level monitoring: Secondary | ICD-10-CM | POA: Insufficient documentation

## 2016-12-05 DIAGNOSIS — Z79899 Other long term (current) drug therapy: Secondary | ICD-10-CM | POA: Insufficient documentation

## 2016-12-05 DIAGNOSIS — D631 Anemia in chronic kidney disease: Secondary | ICD-10-CM | POA: Diagnosis not present

## 2016-12-05 DIAGNOSIS — N185 Chronic kidney disease, stage 5: Secondary | ICD-10-CM

## 2016-12-05 DIAGNOSIS — N184 Chronic kidney disease, stage 4 (severe): Secondary | ICD-10-CM | POA: Diagnosis not present

## 2016-12-05 LAB — POCT HEMOGLOBIN-HEMACUE: HEMOGLOBIN: 9.4 g/dL — AB (ref 13.0–17.0)

## 2016-12-05 MED ORDER — EPOETIN ALFA 20000 UNIT/ML IJ SOLN
INTRAMUSCULAR | Status: AC
  Filled 2016-12-05: qty 1

## 2016-12-05 MED ORDER — EPOETIN ALFA 20000 UNIT/ML IJ SOLN
20000.0000 [IU] | INTRAMUSCULAR | Status: DC
  Administered 2016-12-05: 10:00:00 20000 [IU] via SUBCUTANEOUS

## 2016-12-27 LAB — HEPATIC FUNCTION PANEL
ALT: 64 IU/L — ABNORMAL HIGH (ref 0–44)
AST: 63 IU/L — ABNORMAL HIGH (ref 0–40)
Albumin: 3.6 g/dL (ref 3.5–4.8)
Alkaline Phosphatase: 98 IU/L (ref 39–117)
BILIRUBIN, DIRECT: 0.19 mg/dL (ref 0.00–0.40)
Bilirubin Total: 0.4 mg/dL (ref 0.0–1.2)
Total Protein: 6.7 g/dL (ref 6.0–8.5)

## 2016-12-27 LAB — T4, FREE: FREE T4: 1.07 ng/dL (ref 0.82–1.77)

## 2016-12-27 LAB — TSH: TSH: 7.77 u[IU]/mL — AB (ref 0.450–4.500)

## 2016-12-28 ENCOUNTER — Telehealth: Payer: Self-pay | Admitting: *Deleted

## 2016-12-28 DIAGNOSIS — R7989 Other specified abnormal findings of blood chemistry: Secondary | ICD-10-CM

## 2016-12-28 MED ORDER — LEVOTHYROXINE SODIUM 25 MCG PO TABS: 25.0000 ug | ORAL_TABLET | Freq: Every day | ORAL | 3 refills | Status: DC

## 2016-12-28 NOTE — Telephone Encounter (Addendum)
-----   Message from Lelon Perla, MD sent at 12/27/2016  7:15 AM EDT ----- Synthroid 25 micrograms daily; TSH and free T4 12 weeks Kirk Ruths   Unable to reach pt or leave a message  Spoke with dtr, per DPR, aware of labs. New script sent to the pharmacy Lab orders mailed to the pt

## 2017-01-01 ENCOUNTER — Other Ambulatory Visit (HOSPITAL_COMMUNITY): Payer: Self-pay | Admitting: *Deleted

## 2017-01-02 ENCOUNTER — Encounter (HOSPITAL_COMMUNITY)
Admission: RE | Admit: 2017-01-02 | Discharge: 2017-01-02 | Disposition: A | Discharge status: Home / Self Care | Payer: Medicare Other | Source: Ambulatory Visit | Attending: Nephrology | Admitting: Nephrology

## 2017-01-02 DIAGNOSIS — D631 Anemia in chronic kidney disease: Secondary | ICD-10-CM | POA: Insufficient documentation

## 2017-01-02 DIAGNOSIS — N184 Chronic kidney disease, stage 4 (severe): Secondary | ICD-10-CM | POA: Insufficient documentation

## 2017-01-02 DIAGNOSIS — N185 Chronic kidney disease, stage 5: Secondary | ICD-10-CM

## 2017-01-02 LAB — POCT HEMOGLOBIN-HEMACUE: Hemoglobin: 9.6 g/dL — ABNORMAL LOW (ref 13.0–17.0)

## 2017-01-02 MED ORDER — EPOETIN ALFA 20000 UNIT/ML IJ SOLN
INTRAMUSCULAR | Status: AC
  Administered 2017-01-02: 20000 [IU] via SUBCUTANEOUS
  Filled 2017-01-02: qty 1

## 2017-01-02 MED ORDER — EPOETIN ALFA 20000 UNIT/ML IJ SOLN
20000.0000 [IU] | INTRAMUSCULAR | Status: DC
  Administered 2017-01-02: 20000 [IU] via SUBCUTANEOUS

## 2017-01-08 ENCOUNTER — Other Ambulatory Visit: Payer: Self-pay | Admitting: Cardiology

## 2017-01-23 ENCOUNTER — Encounter (HOSPITAL_COMMUNITY): Payer: Medicare Other

## 2017-01-24 ENCOUNTER — Encounter (HOSPITAL_COMMUNITY)
Admission: RE | Admit: 2017-01-24 | Discharge: 2017-01-24 | Disposition: A | Discharge status: Home / Self Care | Payer: Medicare Other | Source: Ambulatory Visit | Attending: Nephrology | Admitting: Nephrology

## 2017-01-24 DIAGNOSIS — N185 Chronic kidney disease, stage 5: Secondary | ICD-10-CM

## 2017-01-24 DIAGNOSIS — N184 Chronic kidney disease, stage 4 (severe): Secondary | ICD-10-CM | POA: Insufficient documentation

## 2017-01-24 DIAGNOSIS — D631 Anemia in chronic kidney disease: Secondary | ICD-10-CM | POA: Diagnosis not present

## 2017-01-24 LAB — POCT HEMOGLOBIN-HEMACUE: HEMOGLOBIN: 9.9 g/dL — AB (ref 13.0–17.0)

## 2017-01-24 MED ORDER — EPOETIN ALFA 20000 UNIT/ML IJ SOLN
INTRAMUSCULAR | Status: AC
  Filled 2017-01-24: qty 1

## 2017-01-24 MED ORDER — EPOETIN ALFA 20000 UNIT/ML IJ SOLN
20000.0000 [IU] | INTRAMUSCULAR | Status: DC
  Administered 2017-01-24: 10:00:00 20000 [IU] via SUBCUTANEOUS

## 2017-02-08 ENCOUNTER — Telehealth: Payer: Self-pay | Admitting: Cardiology

## 2017-02-08 ENCOUNTER — Other Ambulatory Visit: Payer: Self-pay | Admitting: Cardiology

## 2017-02-08 NOTE — Telephone Encounter (Signed)
Daughter called wanted to know if there was something else pt could take in the place of the Eliquis. She said Eliquis was too expensive,pt could not afford it. She did not know if you might have a discount card or coupon for it. If not,recommend something ,except for Warfarin and any other blood thinner that required coming in for checks.

## 2017-02-08 NOTE — Telephone Encounter (Signed)
Spoke with pt dtr, the patient is currently in the donut hole. The number to [place] given. She also reports the patient is trying to get assistance from the [place]. The last office note faxed to Dr Domenica Fail @ [phone] at her request.

## 2017-02-14 ENCOUNTER — Encounter (HOSPITAL_COMMUNITY)
Admission: RE | Admit: 2017-02-14 | Discharge: 2017-02-14 | Disposition: A | Discharge status: Home / Self Care | Payer: Medicare Other | Source: Ambulatory Visit | Attending: Nephrology | Admitting: Nephrology

## 2017-02-14 DIAGNOSIS — D631 Anemia in chronic kidney disease: Secondary | ICD-10-CM | POA: Diagnosis not present

## 2017-02-14 DIAGNOSIS — N185 Chronic kidney disease, stage 5: Secondary | ICD-10-CM

## 2017-02-14 DIAGNOSIS — N184 Chronic kidney disease, stage 4 (severe): Secondary | ICD-10-CM | POA: Diagnosis not present

## 2017-02-14 LAB — FERRITIN: Ferritin: 520 ng/mL — ABNORMAL HIGH (ref 24–336)

## 2017-02-14 LAB — IRON AND TIBC
IRON: 47 ug/dL (ref 45–182)
SATURATION RATIOS: 20 % (ref 17.9–39.5)
TIBC: 241 ug/dL — AB (ref 250–450)
UIBC: 194 ug/dL

## 2017-02-14 LAB — POCT HEMOGLOBIN-HEMACUE: Hemoglobin: 9.8 g/dL — ABNORMAL LOW (ref 13.0–17.0)

## 2017-02-14 MED ORDER — EPOETIN ALFA 20000 UNIT/ML IJ SOLN
20000.0000 [IU] | INTRAMUSCULAR | Status: DC
  Administered 2017-02-14: 20000 [IU] via SUBCUTANEOUS

## 2017-02-14 MED ORDER — EPOETIN ALFA 20000 UNIT/ML IJ SOLN
INTRAMUSCULAR | Status: AC
  Filled 2017-02-14: qty 1

## 2017-03-07 ENCOUNTER — Encounter (HOSPITAL_COMMUNITY)
Admission: RE | Admit: 2017-03-07 | Discharge: 2017-03-07 | Disposition: A | Discharge status: Home / Self Care | Payer: Medicare Other | Source: Ambulatory Visit | Attending: Nephrology | Admitting: Nephrology

## 2017-03-07 ENCOUNTER — Other Ambulatory Visit: Payer: Self-pay | Admitting: Cardiology

## 2017-03-07 DIAGNOSIS — N185 Chronic kidney disease, stage 5: Secondary | ICD-10-CM

## 2017-03-07 DIAGNOSIS — N184 Chronic kidney disease, stage 4 (severe): Secondary | ICD-10-CM | POA: Diagnosis not present

## 2017-03-07 LAB — POCT HEMOGLOBIN-HEMACUE: Hemoglobin: 9.3 g/dL — ABNORMAL LOW (ref 13.0–17.0)

## 2017-03-07 MED ORDER — EPOETIN ALFA 20000 UNIT/ML IJ SOLN
20000.0000 [IU] | INTRAMUSCULAR | Status: DC
  Administered 2017-03-07: 20000 [IU] via SUBCUTANEOUS

## 2017-03-07 MED ORDER — EPOETIN ALFA 20000 UNIT/ML IJ SOLN
INTRAMUSCULAR | Status: AC
  Filled 2017-03-07: qty 1

## 2017-03-08 LAB — TSH: TSH: 7.96 u[IU]/mL — AB (ref 0.450–4.500)

## 2017-03-08 LAB — T4, FREE: Free T4: 1.15 ng/dL (ref 0.82–1.77)

## 2017-03-21 ENCOUNTER — Encounter (HOSPITAL_COMMUNITY)
Admission: RE | Admit: 2017-03-21 | Discharge: 2017-03-21 | Disposition: A | Discharge status: Home / Self Care | Payer: Medicare Other | Source: Ambulatory Visit | Attending: Nephrology | Admitting: Nephrology

## 2017-03-21 DIAGNOSIS — D631 Anemia in chronic kidney disease: Secondary | ICD-10-CM | POA: Diagnosis not present

## 2017-03-21 DIAGNOSIS — N184 Chronic kidney disease, stage 4 (severe): Secondary | ICD-10-CM | POA: Insufficient documentation

## 2017-03-21 DIAGNOSIS — N185 Chronic kidney disease, stage 5: Secondary | ICD-10-CM

## 2017-03-21 LAB — POCT HEMOGLOBIN-HEMACUE: HEMOGLOBIN: 10.1 g/dL — AB (ref 13.0–17.0)

## 2017-03-21 MED ORDER — EPOETIN ALFA 20000 UNIT/ML IJ SOLN
20000.0000 [IU] | INTRAMUSCULAR | Status: DC
  Administered 2017-03-21: 20000 [IU] via SUBCUTANEOUS

## 2017-03-21 MED ORDER — EPOETIN ALFA 20000 UNIT/ML IJ SOLN
INTRAMUSCULAR | Status: AC
  Filled 2017-03-21: qty 1

## 2017-03-27 ENCOUNTER — Ambulatory Visit (INDEPENDENT_AMBULATORY_CARE_PROVIDER_SITE_OTHER): Payer: Medicare Other | Admitting: Pharmacist

## 2017-03-27 ENCOUNTER — Telehealth: Payer: Self-pay | Admitting: Cardiology

## 2017-03-27 DIAGNOSIS — Z7901 Long term (current) use of anticoagulants: Secondary | ICD-10-CM

## 2017-03-27 DIAGNOSIS — I4891 Unspecified atrial fibrillation: Secondary | ICD-10-CM

## 2017-03-27 LAB — POCT INR: INR: 1.5

## 2017-03-27 MED ORDER — WARFARIN SODIUM 5 MG PO TABS: 5.0000 mg | ORAL_TABLET | Freq: Every evening | ORAL | 3 refills | Status: DC

## 2017-03-27 NOTE — Telephone Encounter (Signed)
New coumadin visit completed today 03/27/2017

## 2017-03-27 NOTE — Telephone Encounter (Signed)
New message   Pt daughter verbalized that [facility] told [facility] that his [facility] said his kidney was 20%  And that he may need a written prescription for warfarin

## 2017-03-27 NOTE — Telephone Encounter (Signed)
S/w Verl Blalock (daughter-DPR) she states that the [place] doctor Dr Elon Alas said to stop Eliquis(he has not stopped yet)and start coumadin due to kidney function being at 20% pt does have kidney doctor Dr Mercy Moore she states that he agrees. Informed daughter to have [facility] fax over notes. How does pt get the coumadin started? Does he need appt with Dr Stanford Breed? Please advise

## 2017-03-28 ENCOUNTER — Encounter (HOSPITAL_COMMUNITY): Payer: Medicare Other

## 2017-03-29 ENCOUNTER — Ambulatory Visit (INDEPENDENT_AMBULATORY_CARE_PROVIDER_SITE_OTHER): Payer: Medicare Other | Admitting: *Deleted

## 2017-03-29 DIAGNOSIS — Z5181 Encounter for therapeutic drug level monitoring: Secondary | ICD-10-CM

## 2017-03-29 DIAGNOSIS — I4891 Unspecified atrial fibrillation: Secondary | ICD-10-CM

## 2017-03-29 DIAGNOSIS — I639 Cerebral infarction, unspecified: Secondary | ICD-10-CM | POA: Diagnosis not present

## 2017-03-29 DIAGNOSIS — Z7901 Long term (current) use of anticoagulants: Secondary | ICD-10-CM | POA: Diagnosis not present

## 2017-03-29 LAB — POCT INR: INR: 1.3

## 2017-04-04 ENCOUNTER — Encounter (HOSPITAL_COMMUNITY)
Admission: RE | Admit: 2017-04-04 | Discharge: 2017-04-04 | Disposition: A | Discharge status: Home / Self Care | Payer: Medicare Other | Source: Ambulatory Visit | Attending: Nephrology | Admitting: Nephrology

## 2017-04-04 DIAGNOSIS — N185 Chronic kidney disease, stage 5: Secondary | ICD-10-CM

## 2017-04-04 DIAGNOSIS — N184 Chronic kidney disease, stage 4 (severe): Secondary | ICD-10-CM | POA: Diagnosis not present

## 2017-04-04 LAB — POCT HEMOGLOBIN-HEMACUE: HEMOGLOBIN: 10.2 g/dL — AB (ref 13.0–17.0)

## 2017-04-04 MED ORDER — EPOETIN ALFA 20000 UNIT/ML IJ SOLN
20000.0000 [IU] | INTRAMUSCULAR | Status: DC
  Administered 2017-04-04: 20000 [IU] via SUBCUTANEOUS

## 2017-04-04 MED ORDER — EPOETIN ALFA 20000 UNIT/ML IJ SOLN
INTRAMUSCULAR | Status: AC
  Administered 2017-04-04: 20000 [IU] via SUBCUTANEOUS
  Filled 2017-04-04: qty 1

## 2017-04-05 ENCOUNTER — Ambulatory Visit (INDEPENDENT_AMBULATORY_CARE_PROVIDER_SITE_OTHER): Payer: Medicare Other | Admitting: Pharmacist

## 2017-04-05 DIAGNOSIS — Z7901 Long term (current) use of anticoagulants: Secondary | ICD-10-CM | POA: Diagnosis not present

## 2017-04-05 LAB — POCT INR: INR: 2.7

## 2017-04-15 ENCOUNTER — Ambulatory Visit (INDEPENDENT_AMBULATORY_CARE_PROVIDER_SITE_OTHER): Payer: Medicare Other | Admitting: Pharmacist

## 2017-04-15 DIAGNOSIS — Z7901 Long term (current) use of anticoagulants: Secondary | ICD-10-CM

## 2017-04-15 DIAGNOSIS — I4891 Unspecified atrial fibrillation: Secondary | ICD-10-CM

## 2017-04-15 LAB — POCT INR: INR: 5.6

## 2017-04-18 ENCOUNTER — Encounter (HOSPITAL_COMMUNITY)
Admission: RE | Admit: 2017-04-18 | Discharge: 2017-04-18 | Disposition: A | Discharge status: Home / Self Care | Payer: Medicare Other | Source: Ambulatory Visit | Attending: Nephrology | Admitting: Nephrology

## 2017-04-18 DIAGNOSIS — N185 Chronic kidney disease, stage 5: Secondary | ICD-10-CM | POA: Diagnosis not present

## 2017-04-18 LAB — POCT HEMOGLOBIN-HEMACUE: Hemoglobin: 10.2 g/dL — ABNORMAL LOW (ref 13.0–17.0)

## 2017-04-18 MED ORDER — EPOETIN ALFA 20000 UNIT/ML IJ SOLN
20000.0000 [IU] | INTRAMUSCULAR | Status: DC
  Administered 2017-04-18: 20000 [IU] via SUBCUTANEOUS

## 2017-04-18 MED ORDER — EPOETIN ALFA 20000 UNIT/ML IJ SOLN
INTRAMUSCULAR | Status: AC
  Filled 2017-04-18: qty 1

## 2017-04-20 IMAGING — US US EXTREM UP *R* LTD
1 series · 10 of 10 positions shown · non-contrast
Comparison: None

CLINICAL DATA: 77-year-old with possible hematoma in the right
upper extremity.

EXAM:
ULTRASOUND RIGHT UPPER EXTREMITY LIMITED
TECHNIQUE: Ultrasound examination of the upper extremity soft tissues was
performed in the area of clinical concern.

[Series 1: us extrem up *right* ltd · 0.06mm/px · 10 of 10 slices shown]
[im 1/10]
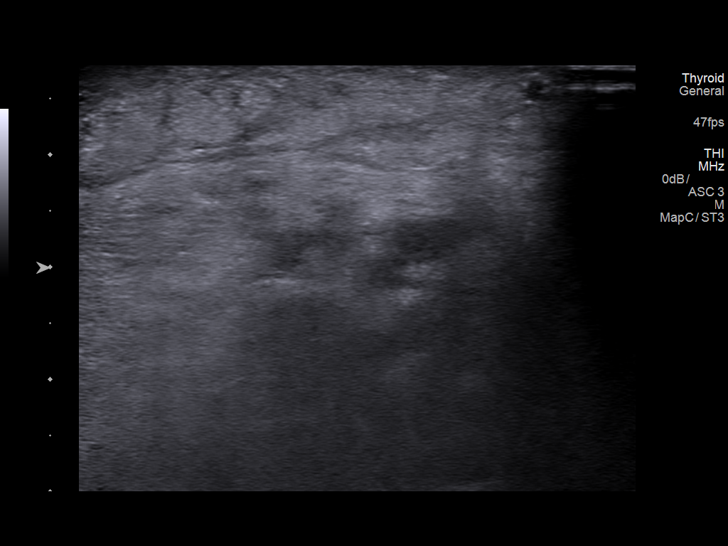
[im 2/10]
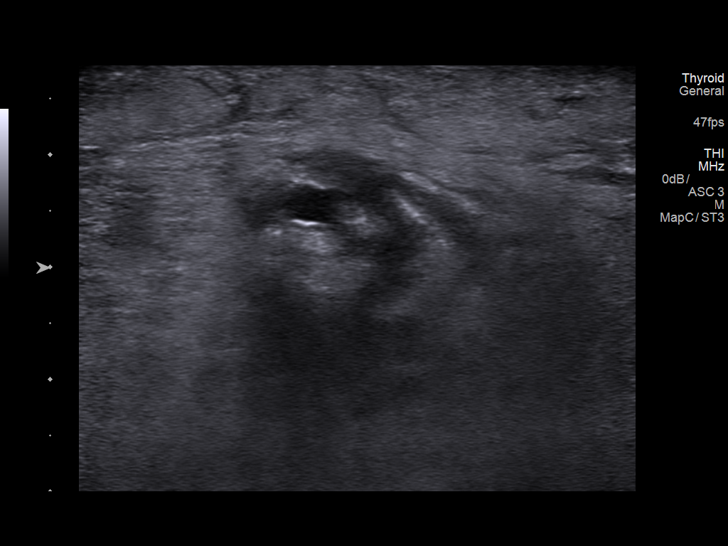
[im 3/10]
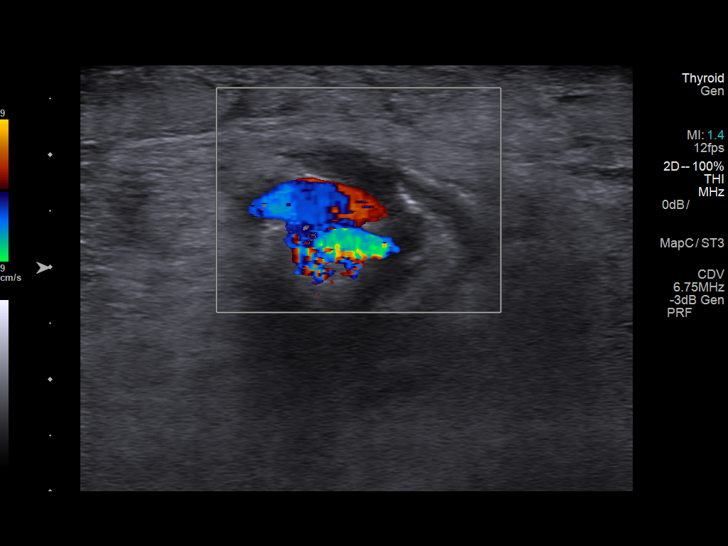
[im 4/10]
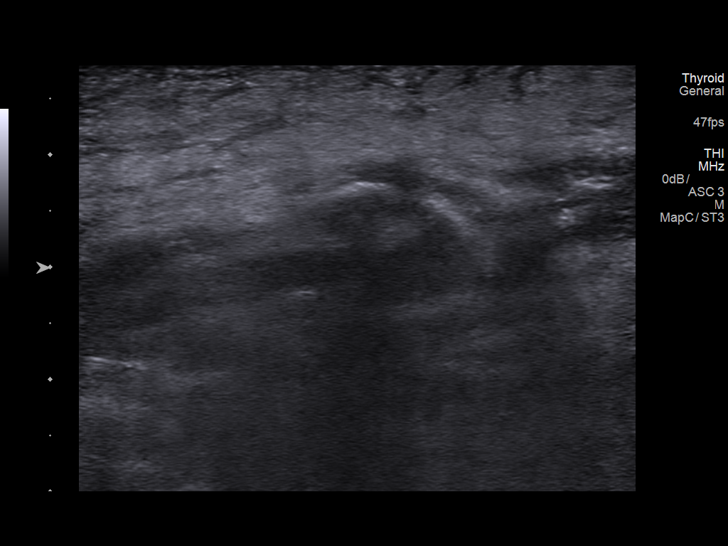
[im 5/10]
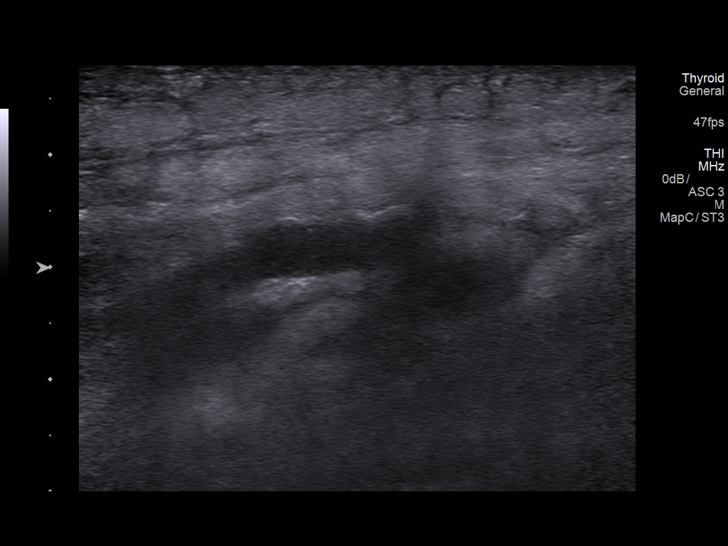
[im 6/10]
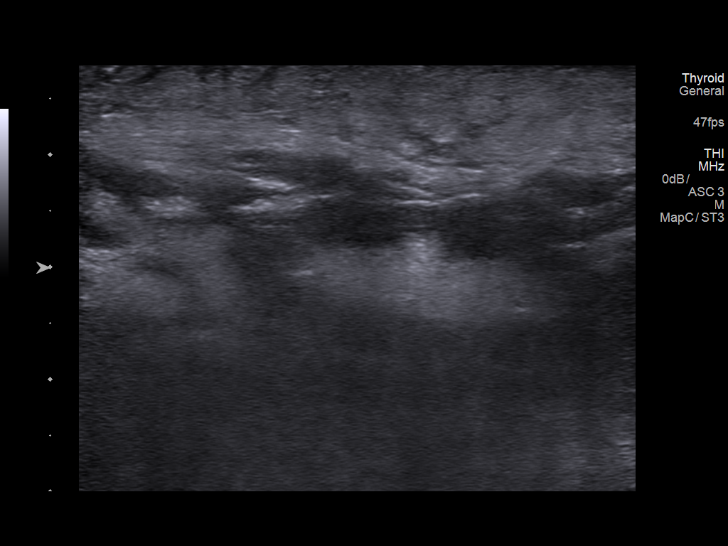
[im 7/10]
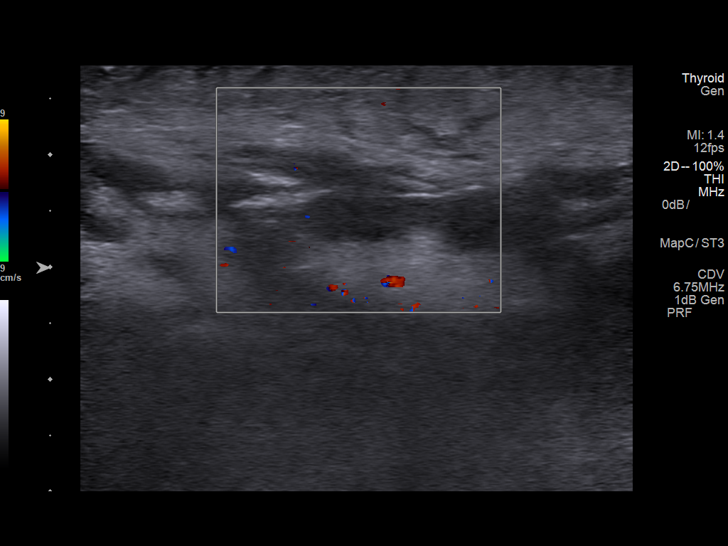
[im 8/10]
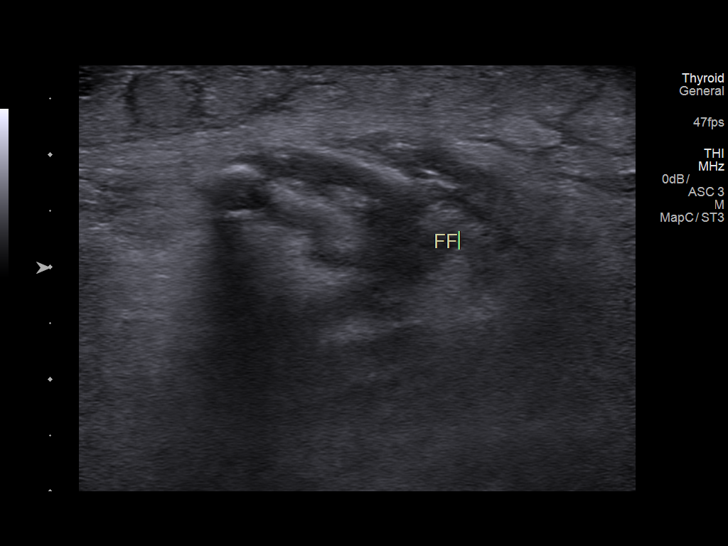
[im 9/10]
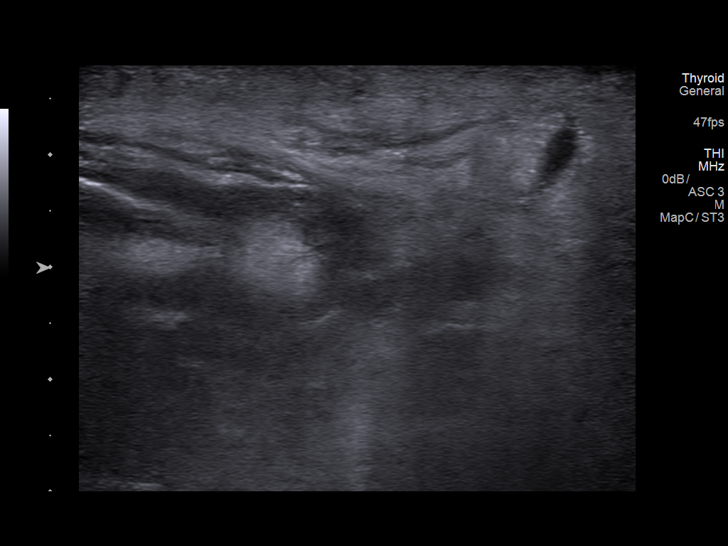
[im 10/10]
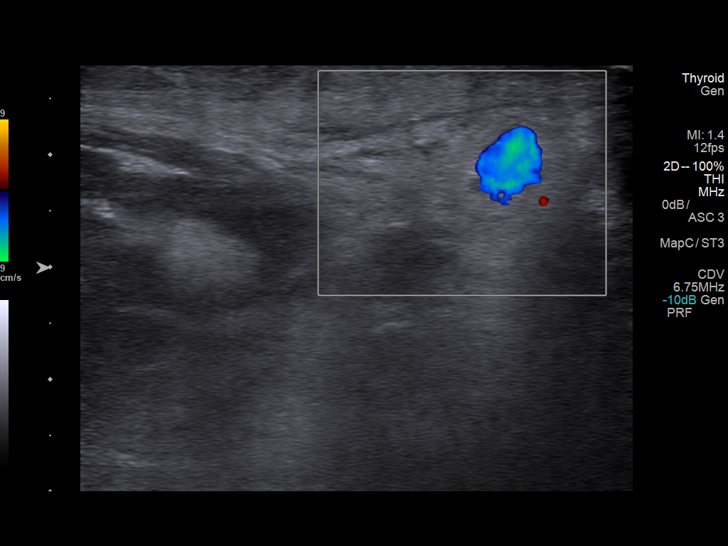

[10 of 10 positions shown; findings below may reference images not displayed]

FINDINGS: Subcutaneous edema at the area of concern. Images are labeled as
right upper forearm. A small amount of fluid around vascular
structures but this is not a dedicated vascular exam and not clear
which vascular structures being imaged.
IMPRESSION: Small amount of fluid and subcutaneous edema at the area of concern.
Fluid is near vascular structures but not clear which vascular
structures are being imaged.

## 2017-04-22 ENCOUNTER — Ambulatory Visit (INDEPENDENT_AMBULATORY_CARE_PROVIDER_SITE_OTHER): Payer: Medicare Other | Admitting: *Deleted

## 2017-04-22 DIAGNOSIS — I4891 Unspecified atrial fibrillation: Secondary | ICD-10-CM

## 2017-04-22 DIAGNOSIS — Z5181 Encounter for therapeutic drug level monitoring: Secondary | ICD-10-CM | POA: Diagnosis not present

## 2017-04-22 DIAGNOSIS — Z7901 Long term (current) use of anticoagulants: Secondary | ICD-10-CM | POA: Diagnosis not present

## 2017-04-22 LAB — POCT INR: INR: 1.9

## 2017-04-29 ENCOUNTER — Ambulatory Visit (INDEPENDENT_AMBULATORY_CARE_PROVIDER_SITE_OTHER): Payer: Medicare Other | Admitting: *Deleted

## 2017-04-29 DIAGNOSIS — I4891 Unspecified atrial fibrillation: Secondary | ICD-10-CM | POA: Diagnosis not present

## 2017-04-29 DIAGNOSIS — Z7901 Long term (current) use of anticoagulants: Secondary | ICD-10-CM | POA: Diagnosis not present

## 2017-04-29 DIAGNOSIS — Z5181 Encounter for therapeutic drug level monitoring: Secondary | ICD-10-CM | POA: Diagnosis not present

## 2017-04-29 LAB — POCT INR: INR: 5.1

## 2017-05-01 ENCOUNTER — Other Ambulatory Visit (HOSPITAL_COMMUNITY): Payer: Self-pay

## 2017-05-02 ENCOUNTER — Inpatient Hospital Stay (HOSPITAL_COMMUNITY): Admission: RE | Admit: 2017-05-02 | Payer: Medicare Other | Source: Ambulatory Visit

## 2017-05-06 ENCOUNTER — Ambulatory Visit (HOSPITAL_COMMUNITY)
Admission: RE | Admit: 2017-05-06 | Discharge: 2017-05-06 | Disposition: A | Discharge status: Home / Self Care | Payer: Medicare Other | Source: Ambulatory Visit | Attending: Nephrology | Admitting: Nephrology

## 2017-05-06 ENCOUNTER — Ambulatory Visit (INDEPENDENT_AMBULATORY_CARE_PROVIDER_SITE_OTHER): Payer: Medicare Other | Admitting: *Deleted

## 2017-05-06 DIAGNOSIS — I4891 Unspecified atrial fibrillation: Secondary | ICD-10-CM | POA: Diagnosis not present

## 2017-05-06 DIAGNOSIS — N185 Chronic kidney disease, stage 5: Secondary | ICD-10-CM | POA: Insufficient documentation

## 2017-05-06 DIAGNOSIS — Z7901 Long term (current) use of anticoagulants: Secondary | ICD-10-CM | POA: Diagnosis not present

## 2017-05-06 LAB — RENAL FUNCTION PANEL
ANION GAP: 12 (ref 5–15)
Albumin: 3.1 g/dL — ABNORMAL LOW (ref 3.5–5.0)
BUN: 54 mg/dL — ABNORMAL HIGH (ref 6–20)
CALCIUM: 8.2 mg/dL — AB (ref 8.9–10.3)
CO2: 26 mmol/L (ref 22–32)
CREATININE: 3.24 mg/dL — AB (ref 0.61–1.24)
Chloride: 102 mmol/L (ref 101–111)
GFR calc Af Amer: 20 mL/min — ABNORMAL LOW (ref >60)
GFR calc non Af Amer: 17 mL/min — ABNORMAL LOW (ref >60)
GLUCOSE: 99 mg/dL (ref 65–99)
Phosphorus: 4.4 mg/dL (ref 2.5–4.6)
Potassium: 3.6 mmol/L (ref 3.5–5.1)
SODIUM: 140 mmol/L (ref 135–145)

## 2017-05-06 LAB — POCT HEMOGLOBIN-HEMACUE: Hemoglobin: 8.5 g/dL — ABNORMAL LOW (ref 13.0–17.0)

## 2017-05-06 LAB — IRON AND TIBC
Iron: 67 ug/dL (ref 45–182)
SATURATION RATIOS: 26 % (ref 17.9–39.5)
TIBC: 260 ug/dL (ref 250–450)
UIBC: 193 ug/dL

## 2017-05-06 LAB — POCT INR: INR: 2

## 2017-05-06 LAB — FERRITIN: FERRITIN: 500 ng/mL — AB (ref 24–336)

## 2017-05-06 MED ORDER — EPOETIN ALFA 20000 UNIT/ML IJ SOLN
20000.0000 [IU] | INTRAMUSCULAR | Status: DC
Start: 2017-05-06 — End: 2017-05-07

## 2017-05-06 MED ORDER — EPOETIN ALFA 20000 UNIT/ML IJ SOLN
INTRAMUSCULAR | Status: AC
  Administered 2017-05-06: 20000 [IU]
  Filled 2017-05-06: qty 1

## 2017-05-07 LAB — PTH, INTACT AND CALCIUM
CALCIUM TOTAL (PTH): 8.2 mg/dL — AB (ref 8.6–10.2)
PTH: 112 pg/mL — ABNORMAL HIGH (ref 15–65)

## 2017-05-09 ENCOUNTER — Telehealth: Payer: Self-pay | Admitting: *Deleted

## 2017-05-09 NOTE — Telephone Encounter (Signed)
Follow up     Patient requesting to take Eliquis. Patient  states he will not take Coumadin  Please call  Pt c/o medication issue:  1. Name of Medication: Coumadin  2. How are you currently taking this medication (dosage and times per day)?  3. Are you having a reaction (difficulty breathing--STAT)? no  4. What is your medication issue?  States Coumadin is causing him too much pain

## 2017-05-09 NOTE — Telephone Encounter (Signed)
If on dialysis, needs to stay on coumadin; if not can DC coumadin and treat with apixaban 5 bid Troy [name]

## 2017-05-09 NOTE — Telephone Encounter (Signed)
LMOM; Patient to call back and schedule next INR check at [facility] office to discuss options with pharmacist.  Nov/6 or Nov/7 available.   *Apixaban previously discontinued due to advanced CKD and unable to get medication from [facility]*

## 2017-05-09 NOTE — Telephone Encounter (Signed)
Pt called & stated he needs to be changed from Coumadin to Eliquis because he is having more bruising & just needs to take Eliquis again. Advised pt that his Cardiologist, Dr. Stanford Breed, has to order the switch from Coumadin to Eliquis & this is not something the Anticoagulation Clinic can do without MD approval. Pt stated he understood. Pt transferred to [facility] office & this message routed to [place].

## 2017-05-09 NOTE — Telephone Encounter (Signed)
Spoke with pt, he is not on dialysis. Will forward to the [facility] clinic to take care of switching the patient to eliquis.

## 2017-05-14 ENCOUNTER — Ambulatory Visit (INDEPENDENT_AMBULATORY_CARE_PROVIDER_SITE_OTHER): Payer: Medicare Other | Admitting: Pharmacist

## 2017-05-14 ENCOUNTER — Telehealth: Payer: Self-pay | Admitting: Pharmacist

## 2017-05-14 DIAGNOSIS — I4891 Unspecified atrial fibrillation: Secondary | ICD-10-CM

## 2017-05-14 DIAGNOSIS — Z7901 Long term (current) use of anticoagulants: Secondary | ICD-10-CM

## 2017-05-14 LAB — POCT INR: INR: 1.2

## 2017-05-14 NOTE — Telephone Encounter (Signed)
-----   Message from Lelon Perla, MD sent at 05/14/2017  1:26 PM EST ----- Regarding: RE: Anticoagulation Would tell him safest is coumadin Kirk Ruths  ----- Message ----- From: Leeroy Bock, [facility] Sent: 05/14/2017   1:09 PM To: Lelon Perla, MD Subject: RE: Anticoagulation                            He is still wanting to change to Eliquis but I wanted to double check your thoughts on safety and dosing.   Thanks, Jinny Blossom  ----- Message ----- From: Lelon Perla, MD Sent: 05/14/2017  10:20 AM To: Leeroy Bock, RPH Subject: RE: Anticoagulation                            If he is agreeable can continue coumadin Kirk Ruths  ----- Message ----- From: Leeroy Bock, [facility] Sent: 05/14/2017   9:32 AM To: Lelon Perla, MD Subject: Anticoagulation                                Hi Dr Stanford Breed,  Mr Lekas came in for an INR check today and mentioned switching to Eliquis. I see you recently commented on switching him to Eliquis 5mg  BID. With his SCr 3.5 -4 and CrCl of 15, we have very limited data on safety and efficacy of Eliquis. We discussed this with pt today and his main concern was bruising in his arm after a recent injection. We discussed that he would remain at risk of this with Eliquis, and potentially more so with his reduced clearance. I just wanted to touch base with you to see your thoughts. If we do switch to Eliquis, we also don't have much data on correct dosing with his clearance being so low (he would have been excluded from the ARISTOTLE trial), but I worry that he may not clear the 5mg  dose well even with his normal weight and age parameters.  Thank you, Jinny Blossom

## 2017-05-14 NOTE — Telephone Encounter (Signed)
Spoke with pt to discuss continuing Coumadin therapy as this is the safest anticoagulant for him to take with his renal disease. See separate anticoag encounter from today for dosing details.

## 2017-05-17 ENCOUNTER — Other Ambulatory Visit (HOSPITAL_COMMUNITY): Payer: Self-pay | Admitting: *Deleted

## 2017-05-20 ENCOUNTER — Ambulatory Visit (HOSPITAL_COMMUNITY)
Admission: RE | Admit: 2017-05-20 | Discharge: 2017-05-20 | Disposition: A | Discharge status: Home / Self Care | Payer: Medicare Other | Source: Ambulatory Visit | Attending: Nephrology | Admitting: Nephrology

## 2017-05-20 VITALS — BP 146/48 | HR 59 | Temp 98.6°F | Resp 20 | Ht 68.0 in | Wt 178.0 lb

## 2017-05-20 DIAGNOSIS — N184 Chronic kidney disease, stage 4 (severe): Secondary | ICD-10-CM | POA: Insufficient documentation

## 2017-05-20 DIAGNOSIS — D631 Anemia in chronic kidney disease: Secondary | ICD-10-CM | POA: Insufficient documentation

## 2017-05-20 DIAGNOSIS — N185 Chronic kidney disease, stage 5: Secondary | ICD-10-CM

## 2017-05-20 LAB — POCT HEMOGLOBIN-HEMACUE: HEMOGLOBIN: 9 g/dL — AB (ref 13.0–17.0)

## 2017-05-20 MED ORDER — EPOETIN ALFA 20000 UNIT/ML IJ SOLN
INTRAMUSCULAR | Status: AC
  Administered 2017-05-20: 11:00:00 20000 [IU] via SUBCUTANEOUS
  Filled 2017-05-20: qty 1

## 2017-05-20 MED ORDER — EPOETIN ALFA 20000 UNIT/ML IJ SOLN
20000.0000 [IU] | INTRAMUSCULAR | Status: DC
  Administered 2017-05-20: 20000 [IU] via SUBCUTANEOUS

## 2017-05-20 MED ORDER — SODIUM CHLORIDE 0.9 % IV SOLN
510.0000 mg | INTRAVENOUS | Status: DC
  Administered 2017-05-20: 510 mg via INTRAVENOUS
  Filled 2017-05-20: qty 17

## 2017-05-21 ENCOUNTER — Ambulatory Visit (INDEPENDENT_AMBULATORY_CARE_PROVIDER_SITE_OTHER): Payer: Medicare Other | Admitting: *Deleted

## 2017-05-21 DIAGNOSIS — Z7901 Long term (current) use of anticoagulants: Secondary | ICD-10-CM | POA: Diagnosis not present

## 2017-05-21 DIAGNOSIS — I4891 Unspecified atrial fibrillation: Secondary | ICD-10-CM | POA: Diagnosis not present

## 2017-05-21 LAB — POCT INR: INR: 2.2

## 2017-05-21 NOTE — Patient Instructions (Addendum)
Continue taking the dose you have been taking which is 1 tablet daily except 1/2 tablet on Tuesdays, Thursdays, and Saturdays. Recheck INR in 2 weeks. Main # [phone]

## 2017-05-27 ENCOUNTER — Ambulatory Visit (HOSPITAL_COMMUNITY)
Admission: RE | Admit: 2017-05-27 | Discharge: 2017-05-27 | Disposition: A | Discharge status: Home / Self Care | Payer: Medicare Other | Source: Ambulatory Visit | Attending: Nephrology | Admitting: Nephrology

## 2017-05-27 DIAGNOSIS — D631 Anemia in chronic kidney disease: Secondary | ICD-10-CM | POA: Insufficient documentation

## 2017-05-27 MED ORDER — SODIUM CHLORIDE 0.9 % IV SOLN
510.0000 mg | INTRAVENOUS | Status: AC
  Administered 2017-05-27: 510 mg via INTRAVENOUS
  Filled 2017-05-27: qty 17

## 2017-06-03 ENCOUNTER — Encounter (HOSPITAL_COMMUNITY)
Admission: RE | Admit: 2017-06-03 | Discharge: 2017-06-03 | Disposition: A | Discharge status: Home / Self Care | Payer: Medicare Other | Source: Ambulatory Visit | Attending: Nephrology | Admitting: Nephrology

## 2017-06-03 VITALS — BP 148/43 | HR 59

## 2017-06-03 DIAGNOSIS — N185 Chronic kidney disease, stage 5: Secondary | ICD-10-CM

## 2017-06-03 DIAGNOSIS — D631 Anemia in chronic kidney disease: Secondary | ICD-10-CM | POA: Insufficient documentation

## 2017-06-03 DIAGNOSIS — N184 Chronic kidney disease, stage 4 (severe): Secondary | ICD-10-CM | POA: Diagnosis not present

## 2017-06-03 LAB — RENAL FUNCTION PANEL
Albumin: 3.2 g/dL — ABNORMAL LOW (ref 3.5–5.0)
Anion gap: 9 (ref 5–15)
BUN: 52 mg/dL — AB (ref 6–20)
CHLORIDE: 106 mmol/L (ref 101–111)
CO2: 27 mmol/L (ref 22–32)
Calcium: 8 mg/dL — ABNORMAL LOW (ref 8.9–10.3)
Creatinine, Ser: 3.16 mg/dL — ABNORMAL HIGH (ref 0.61–1.24)
GFR calc Af Amer: 20 mL/min — ABNORMAL LOW (ref >60)
GFR calc non Af Amer: 18 mL/min — ABNORMAL LOW (ref >60)
GLUCOSE: 108 mg/dL — AB (ref 65–99)
POTASSIUM: 3.3 mmol/L — AB (ref 3.5–5.1)
Phosphorus: 4.1 mg/dL (ref 2.5–4.6)
Sodium: 142 mmol/L (ref 135–145)

## 2017-06-03 LAB — IRON AND TIBC
IRON: 56 ug/dL (ref 45–182)
SATURATION RATIOS: 23 % (ref 17.9–39.5)
TIBC: 239 ug/dL — AB (ref 250–450)
UIBC: 183 ug/dL

## 2017-06-03 LAB — FERRITIN: Ferritin: 1399 ng/mL — ABNORMAL HIGH (ref 24–336)

## 2017-06-03 LAB — POCT HEMOGLOBIN-HEMACUE: Hemoglobin: 8.4 g/dL — ABNORMAL LOW (ref 13.0–17.0)

## 2017-06-03 MED ORDER — EPOETIN ALFA 20000 UNIT/ML IJ SOLN
INTRAMUSCULAR | Status: AC
  Filled 2017-06-03: qty 1

## 2017-06-03 MED ORDER — EPOETIN ALFA 20000 UNIT/ML IJ SOLN
20000.0000 [IU] | INTRAMUSCULAR | Status: DC
  Administered 2017-06-03: 14:00:00 20000 [IU] via SUBCUTANEOUS

## 2017-06-04 ENCOUNTER — Ambulatory Visit (INDEPENDENT_AMBULATORY_CARE_PROVIDER_SITE_OTHER): Payer: Medicare Other

## 2017-06-04 DIAGNOSIS — I4891 Unspecified atrial fibrillation: Secondary | ICD-10-CM | POA: Diagnosis not present

## 2017-06-04 DIAGNOSIS — Z7901 Long term (current) use of anticoagulants: Secondary | ICD-10-CM | POA: Diagnosis not present

## 2017-06-04 LAB — POCT INR: INR: 2.4

## 2017-06-04 LAB — PTH, INTACT AND CALCIUM
Calcium, Total (PTH): 8 mg/dL — ABNORMAL LOW (ref 8.6–10.2)
PTH: 82 pg/mL — ABNORMAL HIGH (ref 15–65)

## 2017-06-04 NOTE — Patient Instructions (Signed)
Continue on same dosage 1 tablet daily except 1/2 tablet on Tuesdays, Thursdays, and Saturdays. Recheck INR in 3 weeks. Main # [phone]

## 2017-06-17 ENCOUNTER — Encounter (HOSPITAL_COMMUNITY): Payer: Medicare Other

## 2017-06-20 ENCOUNTER — Other Ambulatory Visit (HOSPITAL_COMMUNITY): Payer: Self-pay | Admitting: *Deleted

## 2017-06-21 ENCOUNTER — Ambulatory Visit (HOSPITAL_COMMUNITY)
Admission: RE | Admit: 2017-06-21 | Discharge: 2017-06-21 | Disposition: A | Discharge status: Home / Self Care | Payer: Medicare Other | Source: Ambulatory Visit | Attending: Nephrology | Admitting: Nephrology

## 2017-06-21 VITALS — BP 154/49 | HR 3 | Temp 97.6°F | Resp 20

## 2017-06-21 DIAGNOSIS — N185 Chronic kidney disease, stage 5: Secondary | ICD-10-CM | POA: Insufficient documentation

## 2017-06-21 LAB — POCT HEMOGLOBIN-HEMACUE: Hemoglobin: 9.5 g/dL — ABNORMAL LOW (ref 13.0–17.0)

## 2017-06-21 MED ORDER — EPOETIN ALFA 20000 UNIT/ML IJ SOLN
INTRAMUSCULAR | Status: AC
  Administered 2017-06-21: 12:00:00 20000 [IU]
  Filled 2017-06-21: qty 1

## 2017-06-21 MED ORDER — EPOETIN ALFA 20000 UNIT/ML IJ SOLN: 20000.0000 [IU] | INTRAMUSCULAR | Status: DC

## 2017-06-25 ENCOUNTER — Ambulatory Visit (INDEPENDENT_AMBULATORY_CARE_PROVIDER_SITE_OTHER): Payer: Medicare Other | Admitting: Pharmacist

## 2017-06-25 DIAGNOSIS — I4891 Unspecified atrial fibrillation: Secondary | ICD-10-CM | POA: Diagnosis not present

## 2017-06-25 DIAGNOSIS — Z7901 Long term (current) use of anticoagulants: Secondary | ICD-10-CM | POA: Diagnosis not present

## 2017-06-25 LAB — POCT INR: INR: 2

## 2017-06-25 NOTE — Patient Instructions (Signed)
Description   Continue on same dosage 1 tablet daily except 1/2 tablet on Tuesdays, Thursdays, and Saturdays. Recheck INR in 4 weeks. Main # [phone]

## 2017-06-28 ENCOUNTER — Other Ambulatory Visit: Payer: Self-pay | Admitting: Student

## 2017-07-03 DIAGNOSIS — E785 Hyperlipidemia, unspecified: Secondary | ICD-10-CM | POA: Insufficient documentation

## 2017-07-03 DIAGNOSIS — I6529 Occlusion and stenosis of unspecified carotid artery: Secondary | ICD-10-CM | POA: Insufficient documentation

## 2017-07-03 DIAGNOSIS — I5032 Chronic diastolic (congestive) heart failure: Secondary | ICD-10-CM | POA: Insufficient documentation

## 2017-07-03 DIAGNOSIS — N200 Calculus of kidney: Secondary | ICD-10-CM | POA: Insufficient documentation

## 2017-07-03 DIAGNOSIS — I10 Essential (primary) hypertension: Secondary | ICD-10-CM | POA: Insufficient documentation

## 2017-07-03 DIAGNOSIS — K649 Unspecified hemorrhoids: Secondary | ICD-10-CM | POA: Insufficient documentation

## 2017-07-03 DIAGNOSIS — S060X9A Concussion with loss of consciousness of unspecified duration, initial encounter: Secondary | ICD-10-CM | POA: Insufficient documentation

## 2017-07-03 DIAGNOSIS — Z9289 Personal history of other medical treatment: Secondary | ICD-10-CM | POA: Insufficient documentation

## 2017-07-03 DIAGNOSIS — M1990 Unspecified osteoarthritis, unspecified site: Secondary | ICD-10-CM | POA: Insufficient documentation

## 2017-07-03 DIAGNOSIS — K439 Ventral hernia without obstruction or gangrene: Secondary | ICD-10-CM | POA: Insufficient documentation

## 2017-07-03 DIAGNOSIS — D649 Anemia, unspecified: Secondary | ICD-10-CM | POA: Insufficient documentation

## 2017-07-03 DIAGNOSIS — S060XAA Concussion with loss of consciousness status unknown, initial encounter: Secondary | ICD-10-CM | POA: Insufficient documentation

## 2017-07-03 DIAGNOSIS — I480 Paroxysmal atrial fibrillation: Secondary | ICD-10-CM | POA: Insufficient documentation

## 2017-07-05 ENCOUNTER — Ambulatory Visit (HOSPITAL_COMMUNITY)
Admission: RE | Admit: 2017-07-05 | Discharge: 2017-07-05 | Disposition: A | Discharge status: Home / Self Care | Payer: Medicare Other | Source: Ambulatory Visit | Attending: Nephrology | Admitting: Nephrology

## 2017-07-05 VITALS — BP 154/51 | HR 57 | Temp 97.8°F | Resp 20

## 2017-07-05 DIAGNOSIS — D631 Anemia in chronic kidney disease: Secondary | ICD-10-CM | POA: Diagnosis present

## 2017-07-05 DIAGNOSIS — N189 Chronic kidney disease, unspecified: Secondary | ICD-10-CM | POA: Diagnosis not present

## 2017-07-05 DIAGNOSIS — N185 Chronic kidney disease, stage 5: Secondary | ICD-10-CM

## 2017-07-05 LAB — RENAL FUNCTION PANEL
ALBUMIN: 3.1 g/dL — AB (ref 3.5–5.0)
Anion gap: 13 (ref 5–15)
BUN: 66 mg/dL — AB (ref 6–20)
CHLORIDE: 102 mmol/L (ref 101–111)
CO2: 27 mmol/L (ref 22–32)
CREATININE: 3.06 mg/dL — AB (ref 0.61–1.24)
Calcium: 8.7 mg/dL — ABNORMAL LOW (ref 8.9–10.3)
GFR, EST AFRICAN AMERICAN: 21 mL/min — AB (ref >60)
GFR, EST NON AFRICAN AMERICAN: 18 mL/min — AB (ref >60)
Glucose, Bld: 85 mg/dL (ref 65–99)
PHOSPHORUS: 4.3 mg/dL (ref 2.5–4.6)
POTASSIUM: 4.4 mmol/L (ref 3.5–5.1)
Sodium: 142 mmol/L (ref 135–145)

## 2017-07-05 LAB — FERRITIN: FERRITIN: 859 ng/mL — AB (ref 24–336)

## 2017-07-05 LAB — IRON AND TIBC
IRON: 71 ug/dL (ref 45–182)
Saturation Ratios: 27 % (ref 17.9–39.5)
TIBC: 266 ug/dL (ref 250–450)
UIBC: 195 ug/dL

## 2017-07-05 LAB — POCT HEMOGLOBIN-HEMACUE: HEMOGLOBIN: 9.9 g/dL — AB (ref 13.0–17.0)

## 2017-07-05 MED ORDER — EPOETIN ALFA 20000 UNIT/ML IJ SOLN
20000.0000 [IU] | INTRAMUSCULAR | Status: DC
  Administered 2017-07-05: 10:00:00 20000 [IU] via SUBCUTANEOUS

## 2017-07-05 MED ORDER — EPOETIN ALFA 20000 UNIT/ML IJ SOLN
INTRAMUSCULAR | Status: AC
  Filled 2017-07-05: qty 1

## 2017-07-06 LAB — PTH, INTACT AND CALCIUM
CALCIUM TOTAL (PTH): 8.6 mg/dL (ref 8.6–10.2)
PTH: 69 pg/mL — AB (ref 15–65)
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
## 2017-07-19 ENCOUNTER — Ambulatory Visit (HOSPITAL_COMMUNITY)
Admission: RE | Admit: 2017-07-19 | Discharge: 2017-07-19 | Disposition: A | Discharge status: Home / Self Care | Payer: Medicare Other | Source: Ambulatory Visit | Attending: Nephrology | Admitting: Nephrology

## 2017-07-19 VITALS — BP 165/55 | HR 65 | Temp 98.2°F | Resp 16

## 2017-07-19 DIAGNOSIS — D631 Anemia in chronic kidney disease: Secondary | ICD-10-CM | POA: Insufficient documentation

## 2017-07-19 DIAGNOSIS — N185 Chronic kidney disease, stage 5: Secondary | ICD-10-CM

## 2017-07-19 DIAGNOSIS — N184 Chronic kidney disease, stage 4 (severe): Secondary | ICD-10-CM | POA: Insufficient documentation

## 2017-07-19 LAB — POCT HEMOGLOBIN-HEMACUE: HEMOGLOBIN: 10.6 g/dL — AB (ref 13.0–17.0)

## 2017-07-19 MED ORDER — EPOETIN ALFA 20000 UNIT/ML IJ SOLN
INTRAMUSCULAR | Status: AC
  Administered 2017-07-19: 10:00:00 20000 [IU]
  Filled 2017-07-19: qty 1

## 2017-07-19 MED ORDER — EPOETIN ALFA 20000 UNIT/ML IJ SOLN: 20000.0000 [IU] | INTRAMUSCULAR | Status: DC

## 2017-07-23 ENCOUNTER — Encounter: Payer: Self-pay | Admitting: Cardiology

## 2017-07-23 ENCOUNTER — Ambulatory Visit (INDEPENDENT_AMBULATORY_CARE_PROVIDER_SITE_OTHER): Payer: Medicare Other | Admitting: Pharmacist

## 2017-07-23 ENCOUNTER — Ambulatory Visit (INDEPENDENT_AMBULATORY_CARE_PROVIDER_SITE_OTHER): Payer: Medicare Other | Admitting: Cardiology

## 2017-07-23 VITALS — BP 136/64 | HR 74 | Ht 67.0 in | Wt 179.0 lb

## 2017-07-23 DIAGNOSIS — I480 Paroxysmal atrial fibrillation: Secondary | ICD-10-CM

## 2017-07-23 DIAGNOSIS — I10 Essential (primary) hypertension: Secondary | ICD-10-CM | POA: Diagnosis not present

## 2017-07-23 DIAGNOSIS — Z7901 Long term (current) use of anticoagulants: Secondary | ICD-10-CM | POA: Diagnosis not present

## 2017-07-23 DIAGNOSIS — I4891 Unspecified atrial fibrillation: Secondary | ICD-10-CM

## 2017-07-23 DIAGNOSIS — I679 Cerebrovascular disease, unspecified: Secondary | ICD-10-CM

## 2017-07-23 DIAGNOSIS — E7800 Pure hypercholesterolemia, unspecified: Secondary | ICD-10-CM

## 2017-07-23 LAB — POCT INR: INR: 2.4

## 2017-07-23 NOTE — Patient Instructions (Addendum)
Medication Instructions:   NO CHANGE  Labwork:  Your physician recommends that you HAVE LAB WORK TODAY  Testing/Procedures:  Your physician has requested that you have a carotid duplex. This test is an ultrasound of the carotid arteries in your neck. It looks at blood flow through these arteries that supply the brain with blood. Allow one hour for this exam. There are no restrictions or special instructions.  A chest x-ray takes a picture of the organs and structures inside the chest, including the heart, lungs, and blood vessels. This test can show several things, including, whether the heart is enlarges; whether fluid is building up in the lungs; and whether pacemaker / defibrillator leads are still in place. AT [facility]    Follow-Up:  Your physician wants you to follow-up in: [place] will receive a reminder letter in the mail two months in advance. If you don't receive a letter, please call our office to schedule the follow-up appointment.   If you need a refill on your cardiac medications before your next appointment, please call your pharmacy.

## 2017-07-24 ENCOUNTER — Telehealth: Payer: Self-pay | Admitting: *Deleted

## 2017-07-24 DIAGNOSIS — R7989 Other specified abnormal findings of blood chemistry: Secondary | ICD-10-CM

## 2017-07-24 LAB — HEPATIC FUNCTION PANEL
ALBUMIN: 4 g/dL (ref 3.5–4.8)
ALT: 18 IU/L (ref 0–44)
AST: 24 IU/L (ref 0–40)
Alkaline Phosphatase: 76 IU/L (ref 39–117)
BILIRUBIN, DIRECT: 0.13 mg/dL (ref 0.00–0.40)
Bilirubin Total: 0.3 mg/dL (ref 0.0–1.2)
Total Protein: 7.6 g/dL (ref 6.0–8.5)

## 2017-07-24 LAB — TSH: TSH: 10.13 u[IU]/mL — AB (ref 0.450–4.500)

## 2017-07-24 MED ORDER — LEVOTHYROXINE SODIUM 50 MCG PO TABS: 50.0000 ug | ORAL_TABLET | Freq: Every day | ORAL | 3 refills | Status: DC

## 2017-07-24 NOTE — Telephone Encounter (Signed)
-----   Message from Lelon Perla, MD sent at 07/24/2017  8:09 AM EST ----- Increase synthroid to 50 micrograms daily; TSH and free T4 12 weeks Kirk Ruths

## 2017-07-24 NOTE — Telephone Encounter (Signed)
Spoke with pt dtr, she voiced understanding of medication change. New script sent to the pharmacy and Lab orders mailed to the pt.

## 2017-07-30 ENCOUNTER — Ambulatory Visit (HOSPITAL_COMMUNITY)
Admission: RE | Admit: 2017-07-30 | Discharge: 2017-07-30 | Disposition: A | Discharge status: Home / Self Care | Payer: Medicare Other | Source: Ambulatory Visit | Attending: Cardiology | Admitting: Cardiology

## 2017-07-30 DIAGNOSIS — I480 Paroxysmal atrial fibrillation: Secondary | ICD-10-CM

## 2017-07-30 DIAGNOSIS — R918 Other nonspecific abnormal finding of lung field: Secondary | ICD-10-CM | POA: Insufficient documentation

## 2017-07-30 IMAGING — CR DG CHEST 2V
2 series · 2 of 2 positions shown · non-contrast
Comparison: [DATE]

CLINICAL DATA: Paroxysmal atrial fibrillation. Amiodarone
surveillance

EXAM:
CHEST  2 VIEW

[w chest pa]
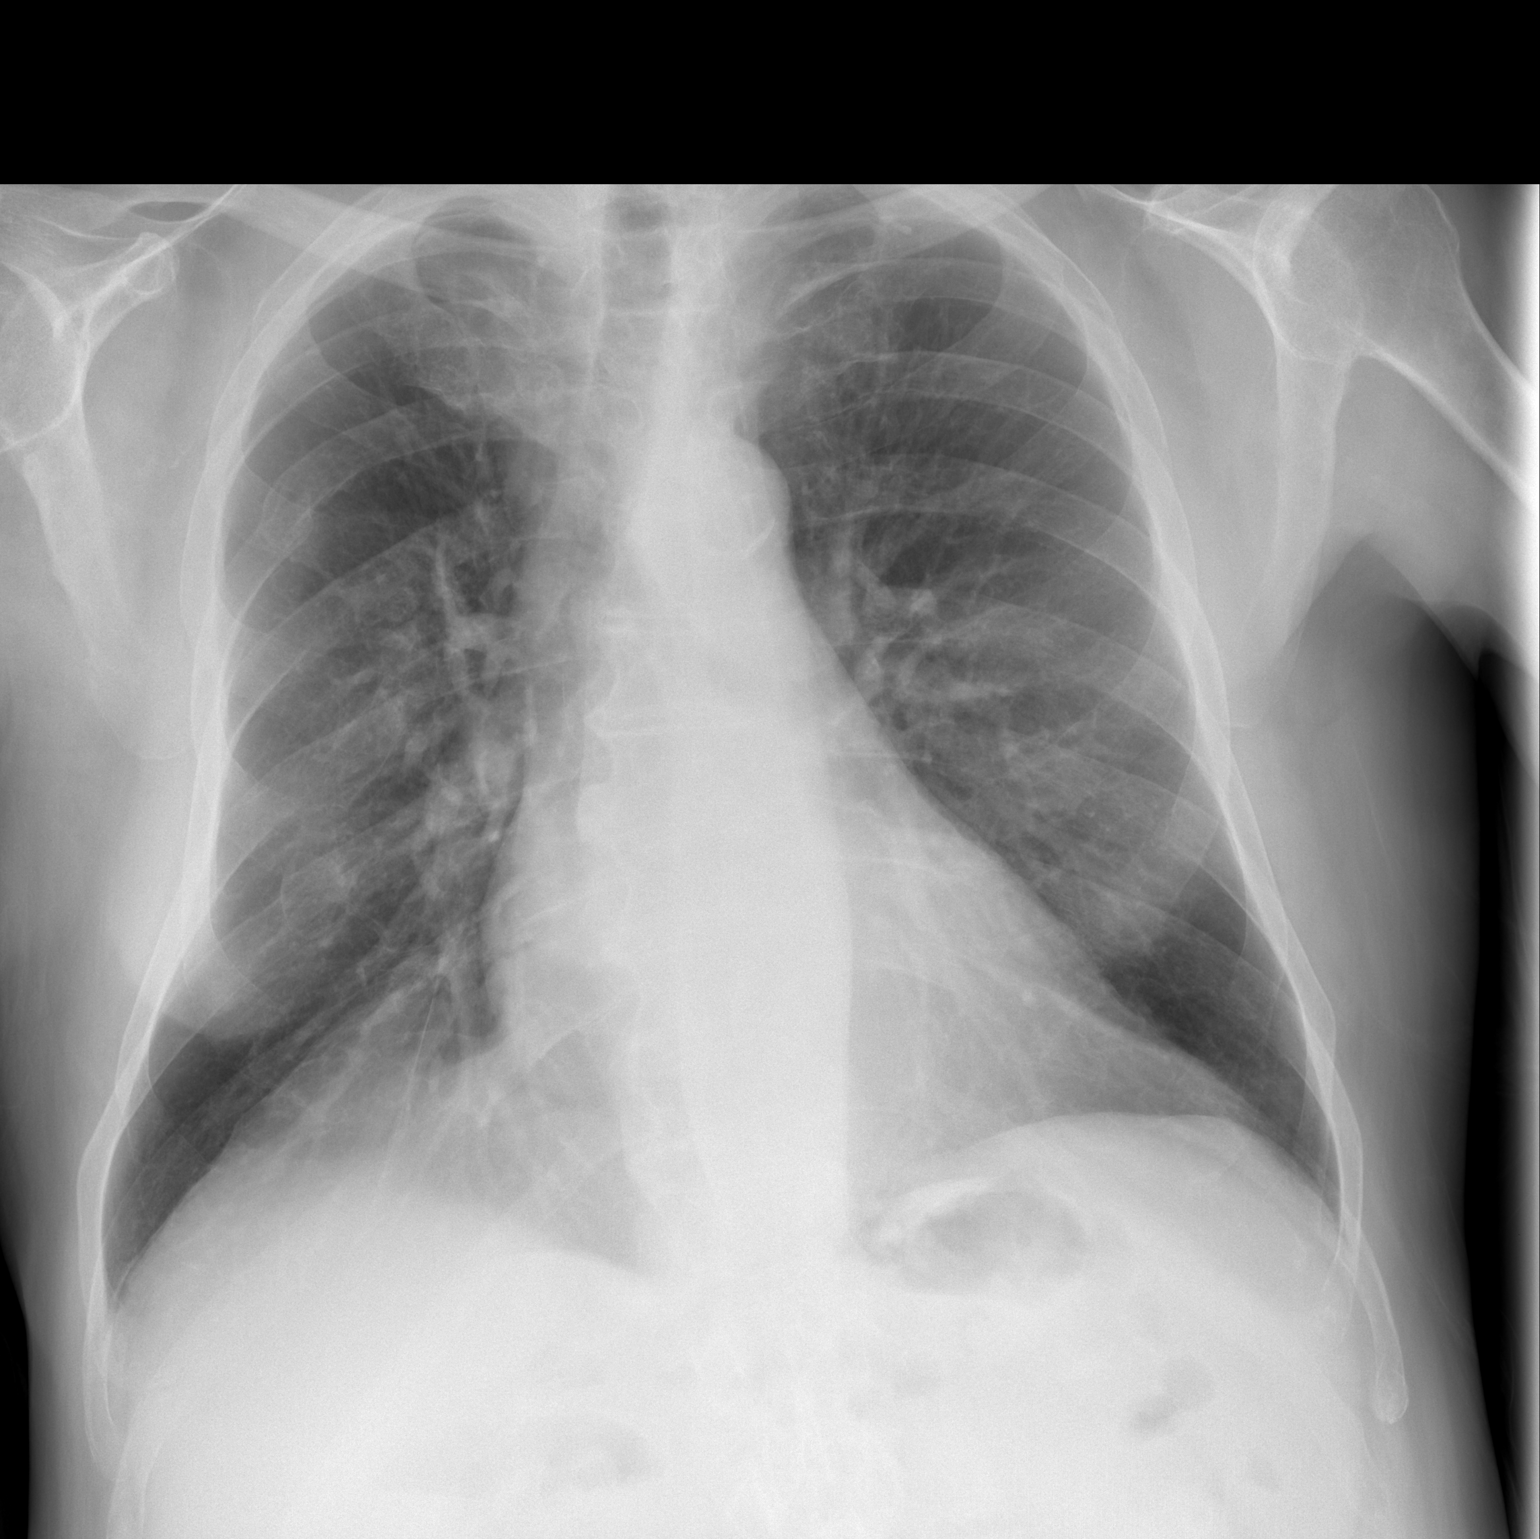

[w chest lat]
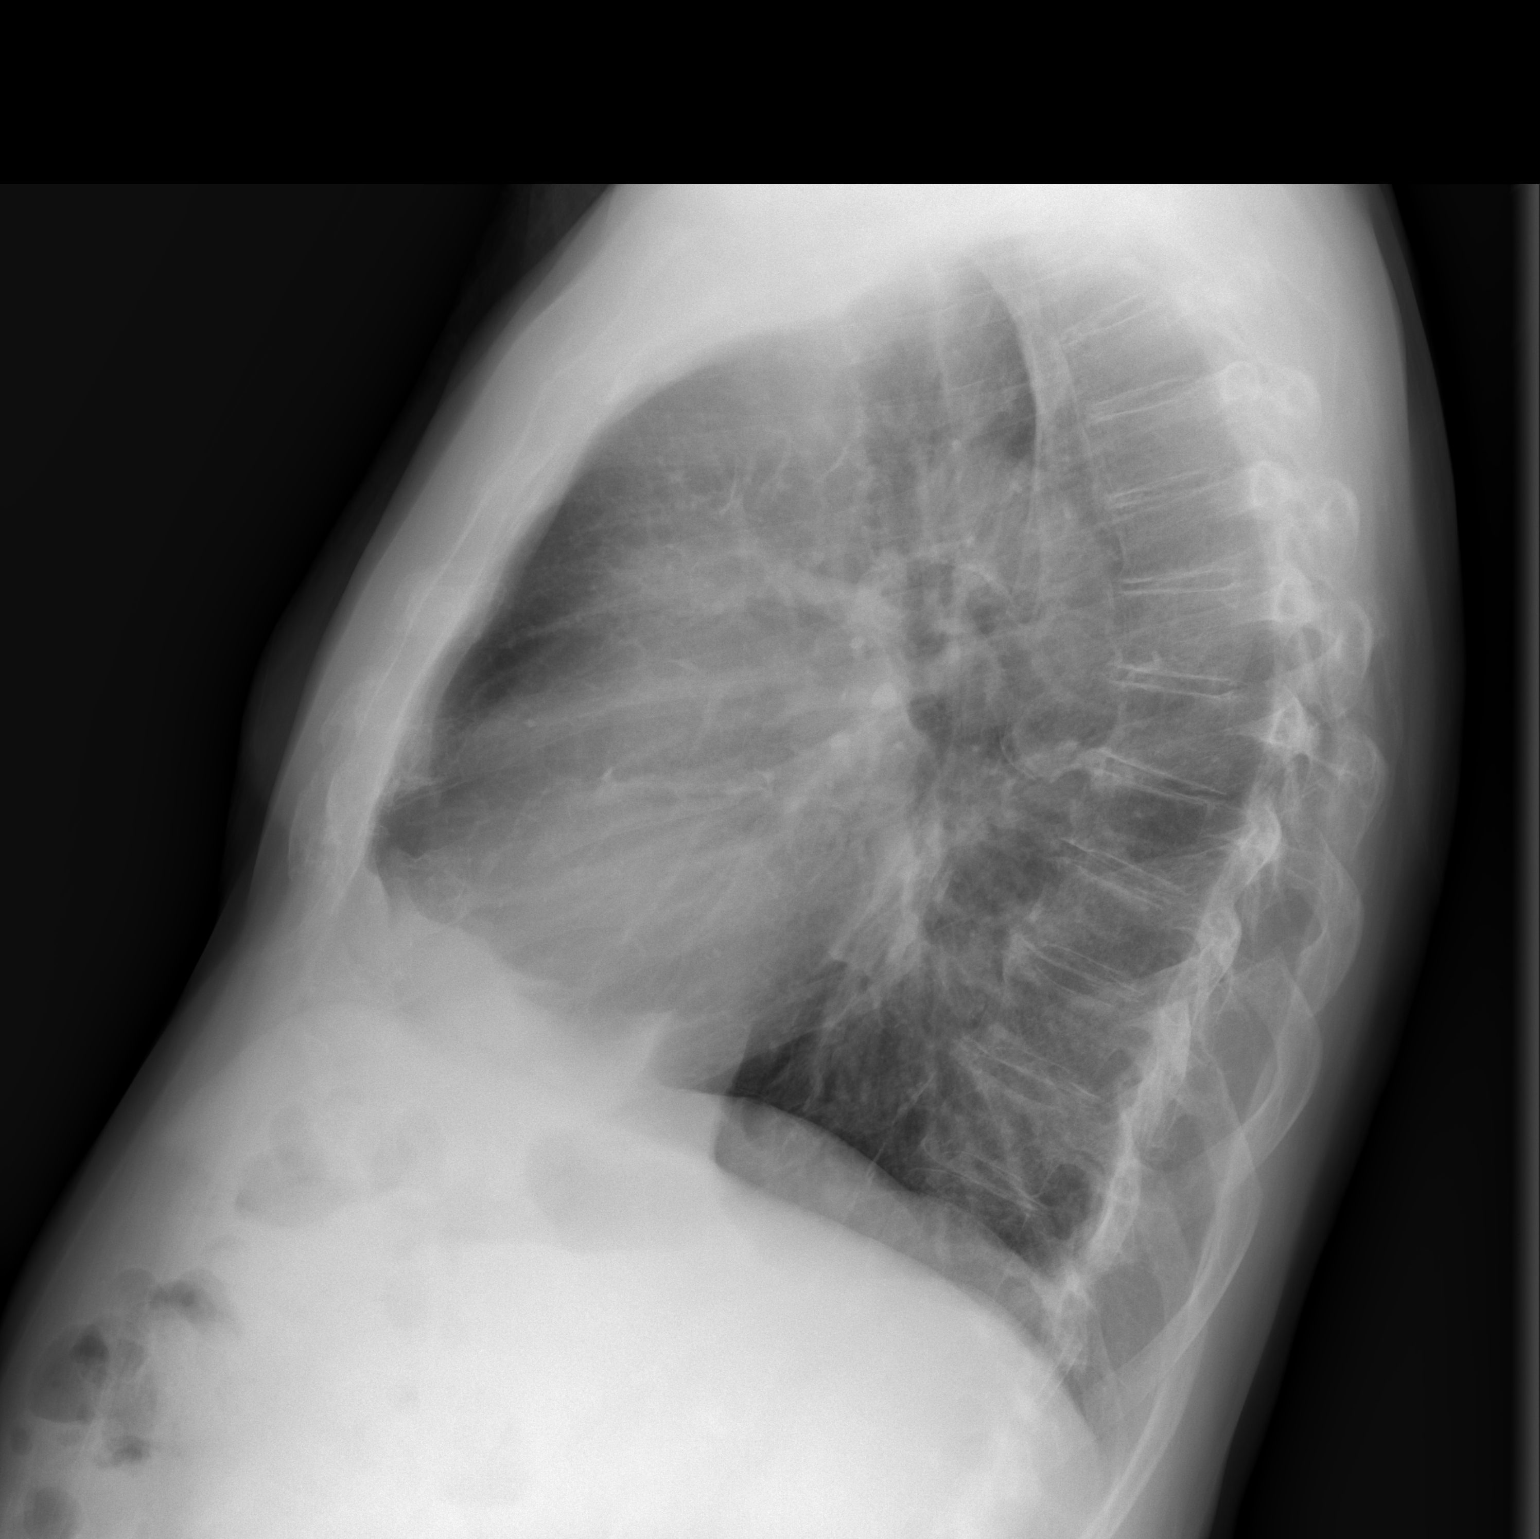

[2 of 2 positions shown; findings below may reference images not displayed]

FINDINGS: Heart is upper limits normal in size, stable. No confluent airspace
opacities or effusions. Mild hyperinflation. No acute bony
abnormality.
IMPRESSION: Stable mild hyperinflation of the lungs.

Borderline heart size.

No active disease.

## 2017-08-02 ENCOUNTER — Encounter (HOSPITAL_COMMUNITY): Payer: Medicare Other

## 2017-08-04 ENCOUNTER — Other Ambulatory Visit: Payer: Self-pay | Admitting: Cardiology

## 2017-08-08 ENCOUNTER — Ambulatory Visit (HOSPITAL_COMMUNITY)
Admission: RE | Admit: 2017-08-08 | Discharge: 2017-08-08 | Disposition: A | Discharge status: Home / Self Care | Payer: Medicare Other | Source: Ambulatory Visit | Attending: Nephrology | Admitting: Nephrology

## 2017-08-08 VITALS — BP 149/54 | HR 62 | Resp 16

## 2017-08-08 DIAGNOSIS — N185 Chronic kidney disease, stage 5: Secondary | ICD-10-CM | POA: Insufficient documentation

## 2017-08-08 LAB — RENAL FUNCTION PANEL
Albumin: 3.3 g/dL — ABNORMAL LOW (ref 3.5–5.0)
Anion gap: 16 — ABNORMAL HIGH (ref 5–15)
BUN: 100 mg/dL — AB (ref 6–20)
CHLORIDE: 104 mmol/L (ref 101–111)
CO2: 22 mmol/L (ref 22–32)
Calcium: 8.8 mg/dL — ABNORMAL LOW (ref 8.9–10.3)
Creatinine, Ser: 4.61 mg/dL — ABNORMAL HIGH (ref 0.61–1.24)
GFR calc Af Amer: 13 mL/min — ABNORMAL LOW (ref >60)
GFR, EST NON AFRICAN AMERICAN: 11 mL/min — AB (ref >60)
GLUCOSE: 117 mg/dL — AB (ref 65–99)
POTASSIUM: 3.8 mmol/L (ref 3.5–5.1)
Phosphorus: 6.5 mg/dL — ABNORMAL HIGH (ref 2.5–4.6)
Sodium: 142 mmol/L (ref 135–145)

## 2017-08-08 LAB — IRON AND TIBC
IRON: 73 ug/dL (ref 45–182)
Saturation Ratios: 30 % (ref 17.9–39.5)
TIBC: 242 ug/dL — ABNORMAL LOW (ref 250–450)
UIBC: 169 ug/dL

## 2017-08-08 LAB — FERRITIN: FERRITIN: 758 ng/mL — AB (ref 24–336)

## 2017-08-08 LAB — POCT HEMOGLOBIN-HEMACUE: HEMOGLOBIN: 9.9 g/dL — AB (ref 13.0–17.0)

## 2017-08-08 MED ORDER — EPOETIN ALFA 20000 UNIT/ML IJ SOLN
INTRAMUSCULAR | Status: AC
  Administered 2017-08-08: 20000 [IU] via SUBCUTANEOUS
  Filled 2017-08-08: qty 1

## 2017-08-08 MED ORDER — EPOETIN ALFA 20000 UNIT/ML IJ SOLN
20000.0000 [IU] | INTRAMUSCULAR | Status: DC
  Administered 2017-08-08: 20000 [IU] via SUBCUTANEOUS

## 2017-08-09 LAB — PTH, INTACT AND CALCIUM
Calcium, Total (PTH): 8.4 mg/dL — ABNORMAL LOW (ref 8.6–10.2)
PTH: 95 pg/mL — ABNORMAL HIGH (ref 15–65)

## 2017-08-15 ENCOUNTER — Ambulatory Visit (HOSPITAL_COMMUNITY)
Admission: RE | Admit: 2017-08-15 | Discharge: 2017-08-15 | Disposition: A | Discharge status: Home / Self Care | Payer: Medicare Other | Source: Ambulatory Visit | Attending: Cardiovascular Disease | Admitting: Cardiovascular Disease

## 2017-08-15 DIAGNOSIS — Z87891 Personal history of nicotine dependence: Secondary | ICD-10-CM | POA: Diagnosis not present

## 2017-08-15 DIAGNOSIS — E785 Hyperlipidemia, unspecified: Secondary | ICD-10-CM | POA: Diagnosis not present

## 2017-08-15 DIAGNOSIS — I10 Essential (primary) hypertension: Secondary | ICD-10-CM | POA: Diagnosis not present

## 2017-08-15 DIAGNOSIS — I6523 Occlusion and stenosis of bilateral carotid arteries: Secondary | ICD-10-CM | POA: Insufficient documentation

## 2017-08-15 DIAGNOSIS — Z8673 Personal history of transient ischemic attack (TIA), and cerebral infarction without residual deficits: Secondary | ICD-10-CM | POA: Diagnosis not present

## 2017-08-15 DIAGNOSIS — I679 Cerebrovascular disease, unspecified: Secondary | ICD-10-CM | POA: Diagnosis not present

## 2017-08-16 ENCOUNTER — Other Ambulatory Visit: Payer: Self-pay | Admitting: *Deleted

## 2017-08-16 DIAGNOSIS — I679 Cerebrovascular disease, unspecified: Secondary | ICD-10-CM

## 2017-08-16 NOTE — Progress Notes (Signed)
VAS 

## 2017-08-20 ENCOUNTER — Ambulatory Visit (INDEPENDENT_AMBULATORY_CARE_PROVIDER_SITE_OTHER): Payer: Medicare Other | Admitting: *Deleted

## 2017-08-20 DIAGNOSIS — I4891 Unspecified atrial fibrillation: Secondary | ICD-10-CM | POA: Diagnosis not present

## 2017-08-20 DIAGNOSIS — Z7901 Long term (current) use of anticoagulants: Secondary | ICD-10-CM | POA: Diagnosis not present

## 2017-08-20 LAB — POCT INR: INR: 2

## 2017-08-20 NOTE — Patient Instructions (Signed)
Description   Continue on same dosage 1 tablet daily except 1/2 tablet on Tuesdays, Thursdays, and Saturdays. Recheck INR in 4 weeks. Main # [phone]

## 2017-08-22 ENCOUNTER — Ambulatory Visit (HOSPITAL_COMMUNITY)
Admission: RE | Admit: 2017-08-22 | Discharge: 2017-08-22 | Disposition: A | Discharge status: Home / Self Care | Payer: Medicare Other | Source: Ambulatory Visit | Attending: Nephrology | Admitting: Nephrology

## 2017-08-22 VITALS — BP 150/46 | HR 60 | Temp 97.8°F | Resp 20

## 2017-08-22 DIAGNOSIS — D631 Anemia in chronic kidney disease: Secondary | ICD-10-CM | POA: Insufficient documentation

## 2017-08-22 DIAGNOSIS — N184 Chronic kidney disease, stage 4 (severe): Secondary | ICD-10-CM | POA: Insufficient documentation

## 2017-08-22 DIAGNOSIS — N185 Chronic kidney disease, stage 5: Secondary | ICD-10-CM

## 2017-08-22 LAB — POCT HEMOGLOBIN-HEMACUE: HEMOGLOBIN: 10.5 g/dL — AB (ref 13.0–17.0)

## 2017-08-22 MED ORDER — EPOETIN ALFA 20000 UNIT/ML IJ SOLN
20000.0000 [IU] | INTRAMUSCULAR | Status: DC
  Administered 2017-08-22: 11:00:00 20000 [IU] via SUBCUTANEOUS

## 2017-08-22 MED ORDER — EPOETIN ALFA 20000 UNIT/ML IJ SOLN
INTRAMUSCULAR | Status: AC
  Filled 2017-08-22: qty 1

## 2017-09-05 ENCOUNTER — Ambulatory Visit (HOSPITAL_COMMUNITY)
Admission: RE | Admit: 2017-09-05 | Discharge: 2017-09-05 | Disposition: A | Discharge status: Home / Self Care | Payer: Medicare Other | Source: Ambulatory Visit | Attending: Nephrology | Admitting: Nephrology

## 2017-09-05 VITALS — BP 101/43 | HR 61 | Temp 98.4°F | Resp 20

## 2017-09-05 DIAGNOSIS — N185 Chronic kidney disease, stage 5: Secondary | ICD-10-CM

## 2017-09-05 DIAGNOSIS — N184 Chronic kidney disease, stage 4 (severe): Secondary | ICD-10-CM | POA: Diagnosis not present

## 2017-09-05 DIAGNOSIS — D631 Anemia in chronic kidney disease: Secondary | ICD-10-CM | POA: Insufficient documentation

## 2017-09-05 LAB — RENAL FUNCTION PANEL
ANION GAP: 14 (ref 5–15)
Albumin: 3.2 g/dL — ABNORMAL LOW (ref 3.5–5.0)
BUN: 75 mg/dL — ABNORMAL HIGH (ref 6–20)
CHLORIDE: 105 mmol/L (ref 101–111)
CO2: 23 mmol/L (ref 22–32)
Calcium: 8.8 mg/dL — ABNORMAL LOW (ref 8.9–10.3)
Creatinine, Ser: 3.86 mg/dL — ABNORMAL HIGH (ref 0.61–1.24)
GFR calc non Af Amer: 14 mL/min — ABNORMAL LOW (ref >60)
GFR, EST AFRICAN AMERICAN: 16 mL/min — AB (ref >60)
GLUCOSE: 88 mg/dL (ref 65–99)
POTASSIUM: 3.8 mmol/L (ref 3.5–5.1)
Phosphorus: 4.8 mg/dL — ABNORMAL HIGH (ref 2.5–4.6)
Sodium: 142 mmol/L (ref 135–145)

## 2017-09-05 LAB — IRON AND TIBC
IRON: 95 ug/dL (ref 45–182)
SATURATION RATIOS: 40 % — AB (ref 17.9–39.5)
TIBC: 238 ug/dL — AB (ref 250–450)
UIBC: 143 ug/dL

## 2017-09-05 LAB — FERRITIN: Ferritin: 613 ng/mL — ABNORMAL HIGH (ref 24–336)

## 2017-09-05 LAB — POCT HEMOGLOBIN-HEMACUE: Hemoglobin: 9.7 g/dL — ABNORMAL LOW (ref 13.0–17.0)

## 2017-09-05 MED ORDER — EPOETIN ALFA 20000 UNIT/ML IJ SOLN
20000.0000 [IU] | INTRAMUSCULAR | Status: DC
  Administered 2017-09-05: 20000 [IU] via SUBCUTANEOUS

## 2017-09-05 MED ORDER — EPOETIN ALFA 20000 UNIT/ML IJ SOLN
INTRAMUSCULAR | Status: AC
  Administered 2017-09-05: 20000 [IU] via SUBCUTANEOUS
  Filled 2017-09-05: qty 1

## 2017-09-06 LAB — PTH, INTACT AND CALCIUM
CALCIUM TOTAL (PTH): 8.8 mg/dL (ref 8.6–10.2)
PTH: 54 pg/mL (ref 15–65)

## 2017-09-17 ENCOUNTER — Ambulatory Visit (INDEPENDENT_AMBULATORY_CARE_PROVIDER_SITE_OTHER): Payer: Medicare Other | Admitting: *Deleted

## 2017-09-17 DIAGNOSIS — I4891 Unspecified atrial fibrillation: Secondary | ICD-10-CM | POA: Diagnosis not present

## 2017-09-17 DIAGNOSIS — Z7901 Long term (current) use of anticoagulants: Secondary | ICD-10-CM | POA: Diagnosis not present

## 2017-09-17 LAB — POCT INR: INR: 3.6

## 2017-09-17 NOTE — Patient Instructions (Addendum)
Description   Do not take any Coumadin today then continue on same dosage 1 tablet daily except 1/2 tablet on Tuesdays, Thursdays, and Saturdays. Recheck INR in 3 weeks.  Main # [phone] Cou[facility]in [facility] [phone]

## 2017-09-19 ENCOUNTER — Ambulatory Visit (HOSPITAL_COMMUNITY)
Admission: RE | Admit: 2017-09-19 | Discharge: 2017-09-19 | Disposition: A | Discharge status: Home / Self Care | Payer: Medicare Other | Source: Ambulatory Visit | Attending: Nephrology | Admitting: Nephrology

## 2017-09-19 VITALS — BP 144/44 | HR 66 | Temp 98.2°F | Resp 18

## 2017-09-19 DIAGNOSIS — D631 Anemia in chronic kidney disease: Secondary | ICD-10-CM | POA: Insufficient documentation

## 2017-09-19 DIAGNOSIS — N184 Chronic kidney disease, stage 4 (severe): Secondary | ICD-10-CM | POA: Diagnosis not present

## 2017-09-19 DIAGNOSIS — N185 Chronic kidney disease, stage 5: Secondary | ICD-10-CM

## 2017-09-19 LAB — POCT HEMOGLOBIN-HEMACUE: HEMOGLOBIN: 9.3 g/dL — AB (ref 13.0–17.0)

## 2017-09-19 MED ORDER — EPOETIN ALFA 20000 UNIT/ML IJ SOLN
INTRAMUSCULAR | Status: AC
  Administered 2017-09-19: 20000 [IU] via SUBCUTANEOUS
  Filled 2017-09-19: qty 1

## 2017-09-19 MED ORDER — EPOETIN ALFA 20000 UNIT/ML IJ SOLN
20000.0000 [IU] | INTRAMUSCULAR | Status: DC
  Administered 2017-09-19: 20000 [IU] via SUBCUTANEOUS

## 2017-10-03 ENCOUNTER — Ambulatory Visit (HOSPITAL_COMMUNITY)
Admission: RE | Admit: 2017-10-03 | Discharge: 2017-10-03 | Disposition: A | Discharge status: Home / Self Care | Payer: Medicare Other | Source: Ambulatory Visit | Attending: Nephrology | Admitting: Nephrology

## 2017-10-03 VITALS — BP 154/57 | HR 69 | Temp 98.2°F | Resp 20

## 2017-10-03 DIAGNOSIS — D631 Anemia in chronic kidney disease: Secondary | ICD-10-CM | POA: Insufficient documentation

## 2017-10-03 DIAGNOSIS — N184 Chronic kidney disease, stage 4 (severe): Secondary | ICD-10-CM | POA: Insufficient documentation

## 2017-10-03 DIAGNOSIS — N185 Chronic kidney disease, stage 5: Secondary | ICD-10-CM

## 2017-10-03 LAB — RENAL FUNCTION PANEL
ALBUMIN: 3.3 g/dL — AB (ref 3.5–5.0)
Anion gap: 13 (ref 5–15)
BUN: 79 mg/dL — ABNORMAL HIGH (ref 6–20)
CO2: 24 mmol/L (ref 22–32)
Calcium: 8.9 mg/dL (ref 8.9–10.3)
Chloride: 105 mmol/L (ref 101–111)
Creatinine, Ser: 3.9 mg/dL — ABNORMAL HIGH (ref 0.61–1.24)
GFR calc Af Amer: 16 mL/min — ABNORMAL LOW (ref >60)
GFR calc non Af Amer: 14 mL/min — ABNORMAL LOW (ref >60)
GLUCOSE: 105 mg/dL — AB (ref 65–99)
PHOSPHORUS: 4.3 mg/dL (ref 2.5–4.6)
POTASSIUM: 4.5 mmol/L (ref 3.5–5.1)
Sodium: 142 mmol/L (ref 135–145)

## 2017-10-03 LAB — IRON AND TIBC
Iron: 90 ug/dL (ref 45–182)
SATURATION RATIOS: 35 % (ref 17.9–39.5)
TIBC: 255 ug/dL (ref 250–450)
UIBC: 165 ug/dL

## 2017-10-03 LAB — POCT HEMOGLOBIN-HEMACUE: HEMOGLOBIN: 9 g/dL — AB (ref 13.0–17.0)

## 2017-10-03 LAB — FERRITIN: Ferritin: 757 ng/mL — ABNORMAL HIGH (ref 24–336)

## 2017-10-03 MED ORDER — EPOETIN ALFA 20000 UNIT/ML IJ SOLN
INTRAMUSCULAR | Status: AC
  Administered 2017-10-03: 20000 [IU] via SUBCUTANEOUS
  Filled 2017-10-03: qty 1

## 2017-10-03 MED ORDER — EPOETIN ALFA 20000 UNIT/ML IJ SOLN
20000.0000 [IU] | INTRAMUSCULAR | Status: DC
  Administered 2017-10-03: 20000 [IU] via SUBCUTANEOUS

## 2017-10-04 LAB — PTH, INTACT AND CALCIUM
Calcium, Total (PTH): 8.7 mg/dL (ref 8.6–10.2)
PTH: 79 pg/mL — AB (ref 15–65)

## 2017-10-08 ENCOUNTER — Ambulatory Visit (INDEPENDENT_AMBULATORY_CARE_PROVIDER_SITE_OTHER): Payer: Medicare Other | Admitting: *Deleted

## 2017-10-08 DIAGNOSIS — Z7901 Long term (current) use of anticoagulants: Secondary | ICD-10-CM | POA: Diagnosis not present

## 2017-10-08 DIAGNOSIS — I4891 Unspecified atrial fibrillation: Secondary | ICD-10-CM

## 2017-10-08 LAB — POCT INR: INR: 4.9

## 2017-10-08 NOTE — Patient Instructions (Signed)
Description   Do not take any Coumadin today or tomorrow,  then change your dose to 1/2 tablet everyday except on 1 tablet on Mondays, Wednesdays and Fridays. Recheck INR in 2 weeks.  Main # [phone] Coumadin Clinic [phone]

## 2017-10-17 ENCOUNTER — Ambulatory Visit (HOSPITAL_COMMUNITY)
Admission: RE | Admit: 2017-10-17 | Discharge: 2017-10-17 | Disposition: A | Discharge status: Home / Self Care | Payer: Medicare Other | Source: Ambulatory Visit | Attending: Nephrology | Admitting: Nephrology

## 2017-10-17 VITALS — BP 146/43 | HR 57 | Temp 97.7°F | Resp 20

## 2017-10-17 DIAGNOSIS — N185 Chronic kidney disease, stage 5: Secondary | ICD-10-CM

## 2017-10-17 DIAGNOSIS — D631 Anemia in chronic kidney disease: Secondary | ICD-10-CM | POA: Insufficient documentation

## 2017-10-17 DIAGNOSIS — N184 Chronic kidney disease, stage 4 (severe): Secondary | ICD-10-CM | POA: Diagnosis not present

## 2017-10-17 LAB — POCT HEMOGLOBIN-HEMACUE: Hemoglobin: 9.4 g/dL — ABNORMAL LOW (ref 13.0–17.0)

## 2017-10-17 MED ORDER — EPOETIN ALFA 20000 UNIT/ML IJ SOLN
INTRAMUSCULAR | Status: AC
  Administered 2017-10-17: 20000 [IU] via SUBCUTANEOUS
  Filled 2017-10-17: qty 1

## 2017-10-17 MED ORDER — EPOETIN ALFA 20000 UNIT/ML IJ SOLN
20000.0000 [IU] | INTRAMUSCULAR | Status: DC
  Administered 2017-10-17: 20000 [IU] via SUBCUTANEOUS

## 2017-10-22 ENCOUNTER — Other Ambulatory Visit: Payer: Self-pay | Admitting: Cardiology

## 2017-10-22 ENCOUNTER — Ambulatory Visit (INDEPENDENT_AMBULATORY_CARE_PROVIDER_SITE_OTHER): Payer: Medicare Other | Admitting: *Deleted

## 2017-10-22 DIAGNOSIS — I480 Paroxysmal atrial fibrillation: Secondary | ICD-10-CM | POA: Diagnosis not present

## 2017-10-22 DIAGNOSIS — I4891 Unspecified atrial fibrillation: Secondary | ICD-10-CM | POA: Diagnosis not present

## 2017-10-22 DIAGNOSIS — Z7901 Long term (current) use of anticoagulants: Secondary | ICD-10-CM | POA: Diagnosis not present

## 2017-10-22 DIAGNOSIS — Z5181 Encounter for therapeutic drug level monitoring: Secondary | ICD-10-CM | POA: Diagnosis not present

## 2017-10-22 LAB — POCT INR: INR: 1.6

## 2017-10-22 NOTE — Patient Instructions (Signed)
Description   Today April 16th take 1 tablet then continue same  dose of coumadin  1/2 tablet everyday except on 1 tablet on Mondays, Wednesdays and Fridays. Recheck INR in 2 weeks.  Main # [phone] Cou[facility]in Clinic [phone] Decrease intake of greens to 1 serving a week

## 2017-10-23 LAB — TSH: TSH: 5.14 u[IU]/mL — ABNORMAL HIGH (ref 0.450–4.500)

## 2017-10-27 ENCOUNTER — Other Ambulatory Visit: Payer: Self-pay | Admitting: Cardiology

## 2017-10-28 NOTE — Telephone Encounter (Signed)
Rx(s) sent to pharmacy electronically.  

## 2017-10-30 ENCOUNTER — Other Ambulatory Visit (HOSPITAL_COMMUNITY): Payer: Self-pay | Admitting: *Deleted

## 2017-10-31 ENCOUNTER — Ambulatory Visit (HOSPITAL_COMMUNITY)
Admission: RE | Admit: 2017-10-31 | Discharge: 2017-10-31 | Disposition: A | Discharge status: Home / Self Care | Payer: Medicare Other | Source: Ambulatory Visit | Attending: Nephrology | Admitting: Nephrology

## 2017-10-31 VITALS — BP 145/51 | HR 57 | Temp 98.6°F | Resp 20

## 2017-10-31 DIAGNOSIS — N184 Chronic kidney disease, stage 4 (severe): Secondary | ICD-10-CM | POA: Insufficient documentation

## 2017-10-31 DIAGNOSIS — D631 Anemia in chronic kidney disease: Secondary | ICD-10-CM | POA: Diagnosis not present

## 2017-10-31 DIAGNOSIS — Z5181 Encounter for therapeutic drug level monitoring: Secondary | ICD-10-CM | POA: Insufficient documentation

## 2017-10-31 DIAGNOSIS — Z79899 Other long term (current) drug therapy: Secondary | ICD-10-CM | POA: Diagnosis not present

## 2017-10-31 DIAGNOSIS — N185 Chronic kidney disease, stage 5: Secondary | ICD-10-CM

## 2017-10-31 LAB — RENAL FUNCTION PANEL
ALBUMIN: 3.5 g/dL (ref 3.5–5.0)
Anion gap: 14 (ref 5–15)
BUN: 90 mg/dL — AB (ref 6–20)
CALCIUM: 9.4 mg/dL (ref 8.9–10.3)
CO2: 26 mmol/L (ref 22–32)
Chloride: 101 mmol/L (ref 101–111)
Creatinine, Ser: 4.94 mg/dL — ABNORMAL HIGH (ref 0.61–1.24)
GFR calc Af Amer: 12 mL/min — ABNORMAL LOW (ref >60)
GFR calc non Af Amer: 10 mL/min — ABNORMAL LOW (ref >60)
GLUCOSE: 109 mg/dL — AB (ref 65–99)
PHOSPHORUS: 5.3 mg/dL — AB (ref 2.5–4.6)
POTASSIUM: 4.4 mmol/L (ref 3.5–5.1)
SODIUM: 141 mmol/L (ref 135–145)

## 2017-10-31 LAB — POCT HEMOGLOBIN-HEMACUE: Hemoglobin: 9.1 g/dL — ABNORMAL LOW (ref 13.0–17.0)

## 2017-10-31 LAB — IRON AND TIBC
IRON: 64 ug/dL (ref 45–182)
Saturation Ratios: 25 % (ref 17.9–39.5)
TIBC: 258 ug/dL (ref 250–450)
UIBC: 194 ug/dL

## 2017-10-31 LAB — FERRITIN: FERRITIN: 599 ng/mL — AB (ref 24–336)

## 2017-10-31 MED ORDER — EPOETIN ALFA 20000 UNIT/ML IJ SOLN
20000.0000 [IU] | INTRAMUSCULAR | Status: DC
  Administered 2017-10-31: 13:00:00 20000 [IU] via SUBCUTANEOUS

## 2017-10-31 MED ORDER — EPOETIN ALFA 20000 UNIT/ML IJ SOLN
INTRAMUSCULAR | Status: AC
  Filled 2017-10-31: qty 1

## 2017-11-01 LAB — PTH, INTACT AND CALCIUM
Calcium, Total (PTH): 9.4 mg/dL (ref 8.6–10.2)
PTH: 42 pg/mL (ref 15–65)

## 2017-11-05 ENCOUNTER — Ambulatory Visit (INDEPENDENT_AMBULATORY_CARE_PROVIDER_SITE_OTHER): Payer: Medicare Other | Admitting: *Deleted

## 2017-11-05 DIAGNOSIS — I4891 Unspecified atrial fibrillation: Secondary | ICD-10-CM | POA: Diagnosis not present

## 2017-11-05 DIAGNOSIS — Z7901 Long term (current) use of anticoagulants: Secondary | ICD-10-CM | POA: Diagnosis not present

## 2017-11-05 LAB — POCT INR: INR: 1.5

## 2017-11-05 NOTE — Patient Instructions (Signed)
Description   Today take 1 tablet, then start taking 1 tablet daily except 1/2 tablet on Sundays, Tuesdays and Thursdays. Recheck INR in 10 days.   Main # [phone] Coumadin Clinic [phone] Decrease intake of greens to 1 serving a week

## 2017-11-08 DIAGNOSIS — R791 Abnormal coagulation profile: Secondary | ICD-10-CM | POA: Insufficient documentation

## 2017-11-08 DIAGNOSIS — Z79899 Other long term (current) drug therapy: Secondary | ICD-10-CM | POA: Insufficient documentation

## 2017-11-08 DIAGNOSIS — N185 Chronic kidney disease, stage 5: Secondary | ICD-10-CM | POA: Insufficient documentation

## 2017-11-08 DIAGNOSIS — I132 Hypertensive heart and chronic kidney disease with heart failure and with stage 5 chronic kidney disease, or end stage renal disease: Secondary | ICD-10-CM | POA: Diagnosis not present

## 2017-11-08 DIAGNOSIS — Z87891 Personal history of nicotine dependence: Secondary | ICD-10-CM | POA: Diagnosis not present

## 2017-11-08 DIAGNOSIS — R072 Precordial pain: Secondary | ICD-10-CM | POA: Insufficient documentation

## 2017-11-08 DIAGNOSIS — R079 Chest pain, unspecified: Secondary | ICD-10-CM | POA: Diagnosis present

## 2017-11-08 DIAGNOSIS — Z7901 Long term (current) use of anticoagulants: Secondary | ICD-10-CM | POA: Diagnosis not present

## 2017-11-08 DIAGNOSIS — I5032 Chronic diastolic (congestive) heart failure: Secondary | ICD-10-CM | POA: Diagnosis not present

## 2017-11-09 ENCOUNTER — Encounter (HOSPITAL_COMMUNITY): Payer: Self-pay | Admitting: Emergency Medicine

## 2017-11-09 ENCOUNTER — Emergency Department (HOSPITAL_COMMUNITY)
Admission: EM | Admit: 2017-11-09 | Discharge: 2017-11-09 | Disposition: A | Discharge status: Home / Self Care | Payer: Medicare Other | Attending: Emergency Medicine | Admitting: Emergency Medicine

## 2017-11-09 ENCOUNTER — Emergency Department (HOSPITAL_COMMUNITY): Payer: Medicare Other

## 2017-11-09 DIAGNOSIS — R072 Precordial pain: Secondary | ICD-10-CM

## 2017-11-09 DIAGNOSIS — R791 Abnormal coagulation profile: Secondary | ICD-10-CM

## 2017-11-09 LAB — I-STAT TROPONIN, ED
TROPONIN I, POC: 0.03 ng/mL (ref 0.00–0.08)
Troponin i, poc: 0.01 ng/mL (ref 0.00–0.08)

## 2017-11-09 LAB — CBC
HEMATOCRIT: 32.4 % — AB (ref 39.0–52.0)
HEMOGLOBIN: 10 g/dL — AB (ref 13.0–17.0)
MCH: 31 pg (ref 26.0–34.0)
MCHC: 30.9 g/dL (ref 30.0–36.0)
MCV: 100.3 fL — ABNORMAL HIGH (ref 78.0–100.0)
Platelets: 220 10*3/uL (ref 150–400)
RBC: 3.23 MIL/uL — AB (ref 4.22–5.81)
RDW: 18.8 % — ABNORMAL HIGH (ref 11.5–15.5)
WBC: 14.1 10*3/uL — ABNORMAL HIGH (ref 4.0–10.5)

## 2017-11-09 LAB — BASIC METABOLIC PANEL
Anion gap: 15 (ref 5–15)
BUN: 90 mg/dL — ABNORMAL HIGH (ref 6–20)
CO2: 26 mmol/L (ref 22–32)
Calcium: 9.3 mg/dL (ref 8.9–10.3)
Chloride: 104 mmol/L (ref 101–111)
Creatinine, Ser: 4.76 mg/dL — ABNORMAL HIGH (ref 0.61–1.24)
GFR, EST AFRICAN AMERICAN: 12 mL/min — AB (ref >60)
GFR, EST NON AFRICAN AMERICAN: 11 mL/min — AB (ref >60)
GLUCOSE: 109 mg/dL — AB (ref 65–99)
POTASSIUM: 4.1 mmol/L (ref 3.5–5.1)
Sodium: 145 mmol/L (ref 135–145)

## 2017-11-09 LAB — PROTIME-INR
INR: 3.73
Prothrombin Time: 36.6 seconds — ABNORMAL HIGH (ref 11.4–15.2)

## 2017-11-09 LAB — D-DIMER, QUANTITATIVE (NOT AT ARMC): D DIMER QUANT: 0.35 ug{FEU}/mL (ref 0.00–0.50)

## 2017-11-09 IMAGING — CR DG CHEST 2V
2 series · 2 of 2 positions shown · non-contrast
Comparison: [DATE]

CLINICAL DATA: Chest pain and short of breath

EXAM:
CHEST - 2 VIEW

[chest pa]
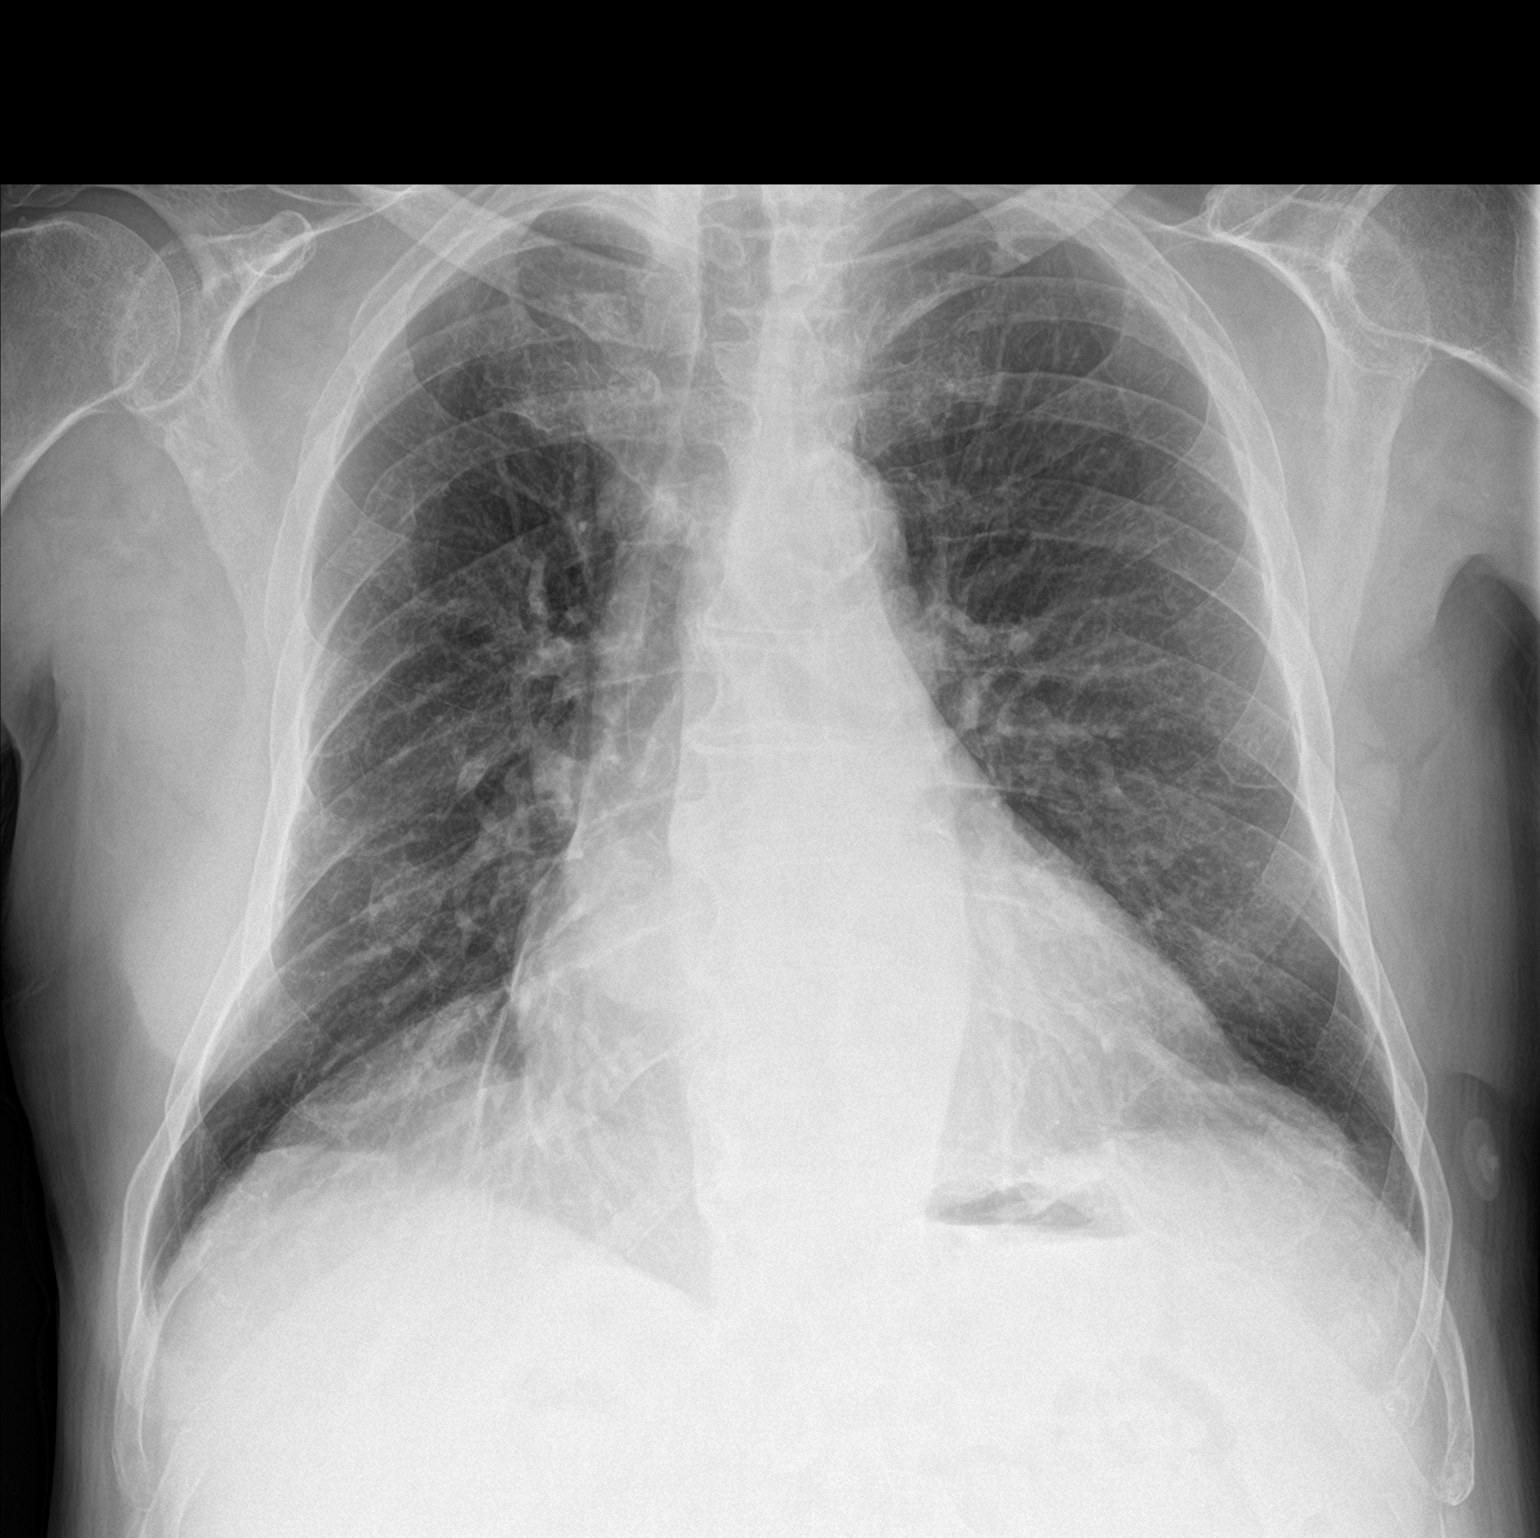

[chest lat]
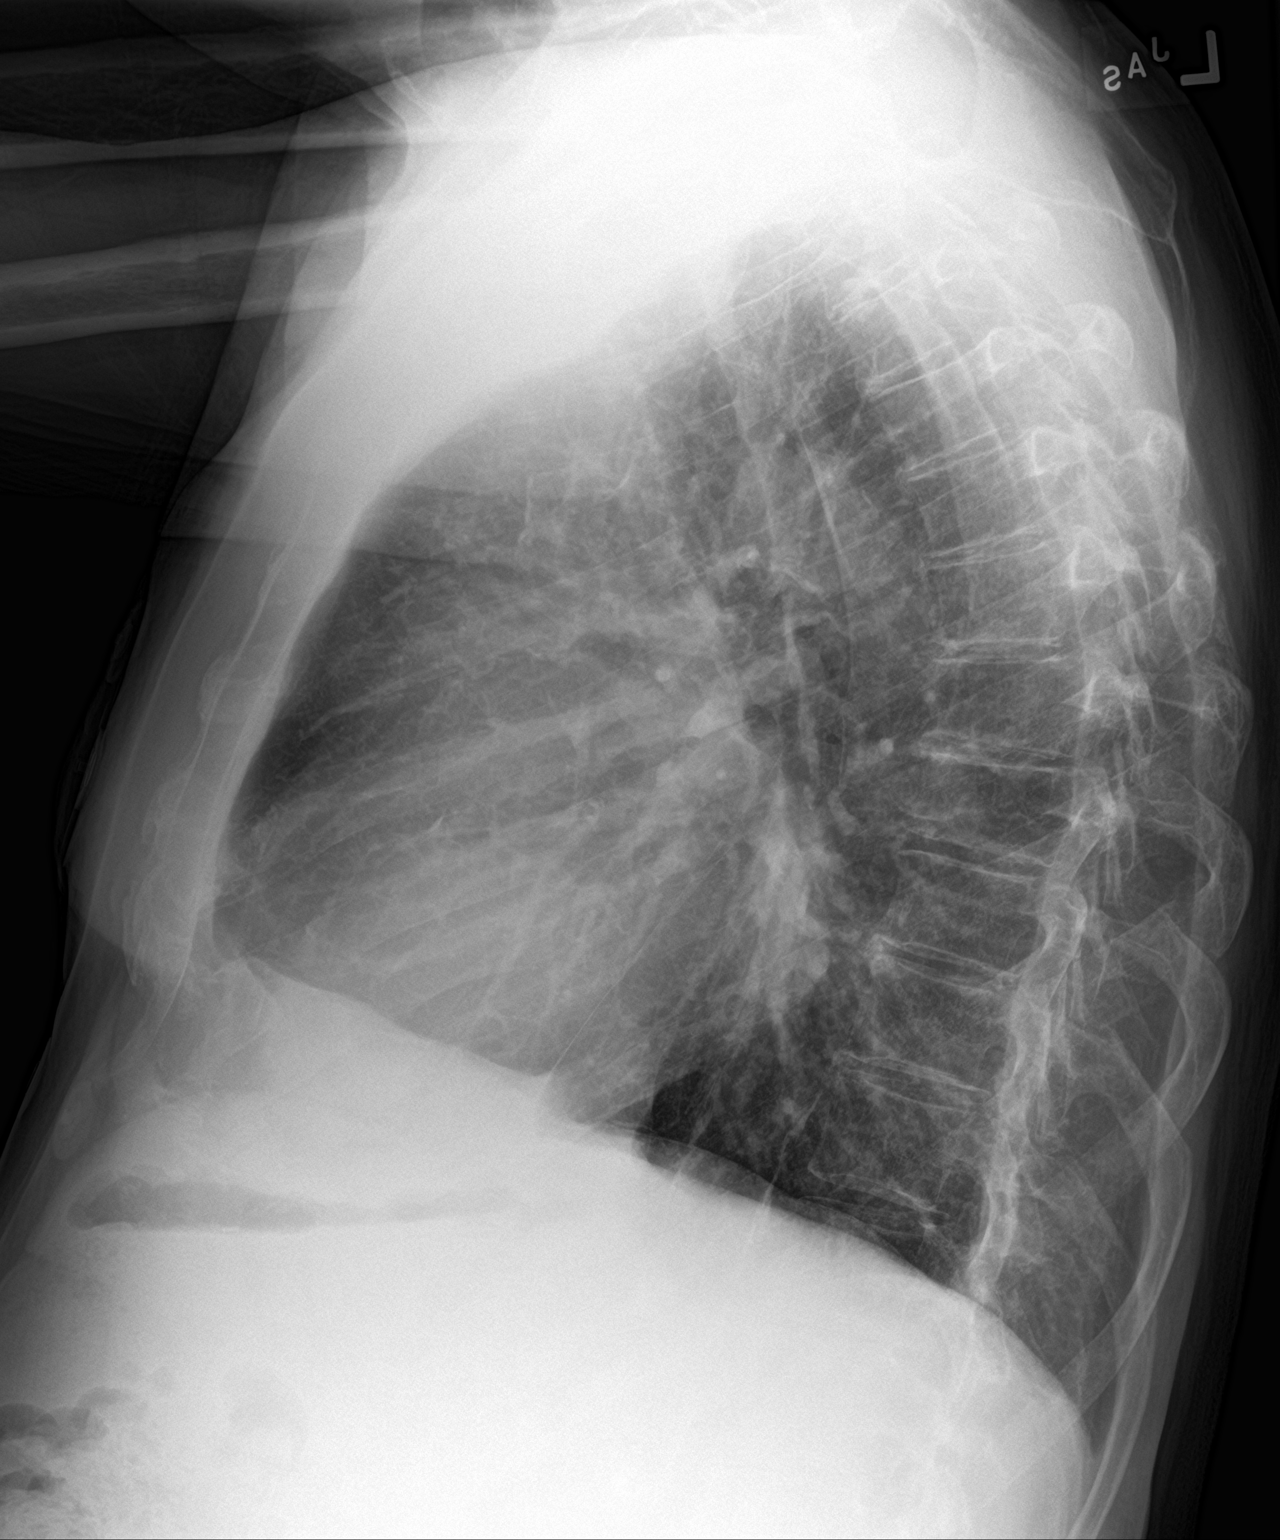

[2 of 2 positions shown; findings below may reference images not displayed]

FINDINGS: Cardiomegaly with aortic atherosclerosis. Hyperinflation.
Atelectasis or scar at the right base. No pneumothorax. No pleural
effusion. Old right-sided rib fractures.
IMPRESSION: No active cardiopulmonary disease.  Mild cardiomegaly.
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
## 2017-11-09 NOTE — Discharge Instructions (Addendum)
As we discussed, please hold your next Coumadin full dose.  Then call the Coumadin clinic on May 6 for further instructions  Your caregiver has diagnosed you as having chest pain that is not specific for one problem, but does not require admission.  Chest pain comes from many different causes.  SEEK IMMEDIATE MEDICAL ATTENTION IF: You have severe chest pain, especially if the pain is crushing or pressure-like and spreads to the arms, back, neck, or jaw, or if you have sweating, nausea (feeling sick to your stomach), or shortness of breath. THIS IS AN EMERGENCY. Don't wait to see if the pain will go away. Get medical help at once. Call 911 or 0 (operator). DO NOT drive yourself to the hospital.  Your chest pain gets worse and does not go away with rest.  You have an attack of chest pain lasting longer than usual, despite rest and treatment with the medications your caregiver has prescribed.  You wake from sleep with chest pain or shortness of breath.  You feel dizzy or faint.  You have chest pain not typical of your usual pain for which you originally saw your caregiver.

## 2017-11-09 NOTE — ED Triage Notes (Signed)
Pt reports CP/SOB X2-3 days. Pt very vague about S/S just states "it hurts" Pt in NAD

## 2017-11-10 ENCOUNTER — Other Ambulatory Visit: Payer: Self-pay | Admitting: Physician Assistant

## 2017-11-11 NOTE — Telephone Encounter (Signed)
REFILL 

## 2017-11-13 ENCOUNTER — Other Ambulatory Visit (HOSPITAL_COMMUNITY): Payer: Self-pay | Admitting: *Deleted

## 2017-11-14 ENCOUNTER — Ambulatory Visit (HOSPITAL_COMMUNITY)
Admission: RE | Admit: 2017-11-14 | Discharge: 2017-11-14 | Disposition: A | Discharge status: Home / Self Care | Payer: Medicare Other | Source: Ambulatory Visit | Attending: Nephrology | Admitting: Nephrology

## 2017-11-14 VITALS — BP 160/54 | HR 61 | Temp 98.6°F | Resp 20 | Ht 68.0 in | Wt 172.0 lb

## 2017-11-14 DIAGNOSIS — N184 Chronic kidney disease, stage 4 (severe): Secondary | ICD-10-CM | POA: Insufficient documentation

## 2017-11-14 DIAGNOSIS — D631 Anemia in chronic kidney disease: Secondary | ICD-10-CM | POA: Insufficient documentation

## 2017-11-14 DIAGNOSIS — N185 Chronic kidney disease, stage 5: Secondary | ICD-10-CM

## 2017-11-14 MED ORDER — SODIUM CHLORIDE 0.9 % IV SOLN
510.0000 mg | INTRAVENOUS | Status: DC
  Administered 2017-11-14: 510 mg via INTRAVENOUS
  Filled 2017-11-14: qty 17

## 2017-11-14 MED ORDER — EPOETIN ALFA 20000 UNIT/ML IJ SOLN
20000.0000 [IU] | INTRAMUSCULAR | Status: DC
  Administered 2017-11-14: 20000 [IU] via SUBCUTANEOUS

## 2017-11-14 MED ORDER — EPOETIN ALFA 20000 UNIT/ML IJ SOLN
INTRAMUSCULAR | Status: AC
  Filled 2017-11-14: qty 1

## 2017-11-15 ENCOUNTER — Ambulatory Visit (INDEPENDENT_AMBULATORY_CARE_PROVIDER_SITE_OTHER): Payer: Medicare Other

## 2017-11-15 DIAGNOSIS — I4891 Unspecified atrial fibrillation: Secondary | ICD-10-CM

## 2017-11-15 DIAGNOSIS — Z7901 Long term (current) use of anticoagulants: Secondary | ICD-10-CM | POA: Diagnosis not present

## 2017-11-15 LAB — POCT HEMOGLOBIN-HEMACUE: Hemoglobin: 8.9 g/dL — ABNORMAL LOW (ref 13.0–17.0)

## 2017-11-15 LAB — POCT INR: INR: 1.5

## 2017-11-15 NOTE — Patient Instructions (Signed)
Description   Take 1.5 tablets today, then resume same dosage 1 tablet daily except 1/2 tablet on Sundays, Tuesdays and Thursdays. Recheck INR in 10 days.   Main # [phone] [facility]in Clinic [phone].

## 2017-11-20 ENCOUNTER — Encounter (HOSPITAL_COMMUNITY): Payer: Medicare Other

## 2017-11-21 ENCOUNTER — Ambulatory Visit (HOSPITAL_COMMUNITY)
Admission: RE | Admit: 2017-11-21 | Discharge: 2017-11-21 | Disposition: A | Discharge status: Home / Self Care | Payer: Medicare Other | Source: Ambulatory Visit | Attending: Nephrology | Admitting: Nephrology

## 2017-11-21 DIAGNOSIS — D631 Anemia in chronic kidney disease: Secondary | ICD-10-CM | POA: Diagnosis present

## 2017-11-21 DIAGNOSIS — N184 Chronic kidney disease, stage 4 (severe): Secondary | ICD-10-CM | POA: Insufficient documentation

## 2017-11-21 MED ORDER — SODIUM CHLORIDE 0.9 % IV SOLN
510.0000 mg | INTRAVENOUS | Status: AC
  Administered 2017-11-21: 510 mg via INTRAVENOUS
  Filled 2017-11-21: qty 17

## 2017-11-25 ENCOUNTER — Ambulatory Visit (INDEPENDENT_AMBULATORY_CARE_PROVIDER_SITE_OTHER): Payer: Medicare Other | Admitting: *Deleted

## 2017-11-25 DIAGNOSIS — Z7901 Long term (current) use of anticoagulants: Secondary | ICD-10-CM | POA: Diagnosis not present

## 2017-11-25 DIAGNOSIS — I4891 Unspecified atrial fibrillation: Secondary | ICD-10-CM | POA: Diagnosis not present

## 2017-11-25 LAB — POCT INR: INR: 2

## 2017-11-25 NOTE — Patient Instructions (Signed)
Description   Continue taking 1 tablet daily except 1/2 tablet on Sundays, Tuesdays and Thursdays. Recheck INR in 2 weeks.   Main # [phone] Cou[facility]in Clinic [phone].

## 2017-11-28 ENCOUNTER — Ambulatory Visit (HOSPITAL_COMMUNITY)
Admission: RE | Admit: 2017-11-28 | Discharge: 2017-11-28 | Disposition: A | Discharge status: Home / Self Care | Payer: Medicare Other | Source: Ambulatory Visit | Attending: Nephrology | Admitting: Nephrology

## 2017-11-28 VITALS — BP 161/51 | HR 62

## 2017-11-28 DIAGNOSIS — N184 Chronic kidney disease, stage 4 (severe): Secondary | ICD-10-CM | POA: Diagnosis not present

## 2017-11-28 DIAGNOSIS — N185 Chronic kidney disease, stage 5: Secondary | ICD-10-CM

## 2017-11-28 DIAGNOSIS — D631 Anemia in chronic kidney disease: Secondary | ICD-10-CM | POA: Diagnosis not present

## 2017-11-28 LAB — RENAL FUNCTION PANEL
Albumin: 3.3 g/dL — ABNORMAL LOW (ref 3.5–5.0)
Anion gap: 13 (ref 5–15)
BUN: 73 mg/dL — ABNORMAL HIGH (ref 6–20)
CHLORIDE: 109 mmol/L (ref 101–111)
CO2: 25 mmol/L (ref 22–32)
Calcium: 8.5 mg/dL — ABNORMAL LOW (ref 8.9–10.3)
Creatinine, Ser: 4.48 mg/dL — ABNORMAL HIGH (ref 0.61–1.24)
GFR, EST AFRICAN AMERICAN: 13 mL/min — AB (ref >60)
GFR, EST NON AFRICAN AMERICAN: 11 mL/min — AB (ref >60)
Glucose, Bld: 100 mg/dL — ABNORMAL HIGH (ref 65–99)
POTASSIUM: 3.9 mmol/L (ref 3.5–5.1)
Phosphorus: 6.2 mg/dL — ABNORMAL HIGH (ref 2.5–4.6)
Sodium: 147 mmol/L — ABNORMAL HIGH (ref 135–145)

## 2017-11-28 LAB — POCT HEMOGLOBIN-HEMACUE: HEMOGLOBIN: 9.6 g/dL — AB (ref 13.0–17.0)

## 2017-11-28 LAB — IRON AND TIBC
IRON: 88 ug/dL (ref 45–182)
Saturation Ratios: 40 % — ABNORMAL HIGH (ref 17.9–39.5)
TIBC: 220 ug/dL — ABNORMAL LOW (ref 250–450)
UIBC: 132 ug/dL

## 2017-11-28 LAB — FERRITIN: Ferritin: 1548 ng/mL — ABNORMAL HIGH (ref 24–336)

## 2017-11-28 MED ORDER — EPOETIN ALFA 20000 UNIT/ML IJ SOLN
INTRAMUSCULAR | Status: AC
  Filled 2017-11-28: qty 1

## 2017-11-28 MED ORDER — EPOETIN ALFA 20000 UNIT/ML IJ SOLN
20000.0000 [IU] | INTRAMUSCULAR | Status: DC
  Administered 2017-11-28: 20000 [IU] via SUBCUTANEOUS

## 2017-11-29 LAB — PTH, INTACT AND CALCIUM
Calcium, Total (PTH): 8.3 mg/dL — ABNORMAL LOW (ref 8.6–10.2)
PTH: 80 pg/mL — ABNORMAL HIGH (ref 15–65)

## 2017-12-09 ENCOUNTER — Ambulatory Visit (INDEPENDENT_AMBULATORY_CARE_PROVIDER_SITE_OTHER): Payer: Medicare Other | Admitting: Pharmacist

## 2017-12-09 DIAGNOSIS — I4891 Unspecified atrial fibrillation: Secondary | ICD-10-CM

## 2017-12-09 DIAGNOSIS — Z7901 Long term (current) use of anticoagulants: Secondary | ICD-10-CM

## 2017-12-09 LAB — POCT INR: INR: 2 (ref 2.0–3.0)

## 2017-12-09 NOTE — Patient Instructions (Signed)
Description   Continue taking 1 tablet daily except 1/2 tablet on Sundays, Tuesdays and Thursdays. Recheck INR in 3 weeks.   Main # [phone] Cou[facility]in Clinic [phone].

## 2017-12-13 ENCOUNTER — Ambulatory Visit (HOSPITAL_COMMUNITY)
Admission: RE | Admit: 2017-12-13 | Discharge: 2017-12-13 | Disposition: A | Discharge status: Home / Self Care | Payer: Medicare Other | Source: Ambulatory Visit | Attending: Nephrology | Admitting: Nephrology

## 2017-12-13 VITALS — BP 160/50 | HR 57 | Temp 98.1°F | Resp 20

## 2017-12-13 DIAGNOSIS — D631 Anemia in chronic kidney disease: Secondary | ICD-10-CM | POA: Insufficient documentation

## 2017-12-13 DIAGNOSIS — N184 Chronic kidney disease, stage 4 (severe): Secondary | ICD-10-CM | POA: Diagnosis not present

## 2017-12-13 DIAGNOSIS — N185 Chronic kidney disease, stage 5: Secondary | ICD-10-CM

## 2017-12-13 LAB — POCT HEMOGLOBIN-HEMACUE: HEMOGLOBIN: 9.5 g/dL — AB (ref 13.0–17.0)

## 2017-12-13 MED ORDER — EPOETIN ALFA 20000 UNIT/ML IJ SOLN
20000.0000 [IU] | INTRAMUSCULAR | Status: DC
  Administered 2017-12-13: 20000 [IU] via SUBCUTANEOUS

## 2017-12-13 MED ORDER — EPOETIN ALFA 20000 UNIT/ML IJ SOLN
INTRAMUSCULAR | Status: AC
  Administered 2017-12-13: 20000 [IU] via SUBCUTANEOUS
  Filled 2017-12-13: qty 1

## 2017-12-27 ENCOUNTER — Ambulatory Visit (HOSPITAL_COMMUNITY)
Admission: RE | Admit: 2017-12-27 | Discharge: 2017-12-27 | Disposition: A | Discharge status: Home / Self Care | Payer: Medicare Other | Source: Ambulatory Visit | Attending: Nephrology | Admitting: Nephrology

## 2017-12-27 VITALS — BP 139/46 | HR 59 | Temp 98.4°F | Resp 20

## 2017-12-27 DIAGNOSIS — N184 Chronic kidney disease, stage 4 (severe): Secondary | ICD-10-CM | POA: Insufficient documentation

## 2017-12-27 DIAGNOSIS — D631 Anemia in chronic kidney disease: Secondary | ICD-10-CM | POA: Insufficient documentation

## 2017-12-27 DIAGNOSIS — N185 Chronic kidney disease, stage 5: Secondary | ICD-10-CM

## 2017-12-27 LAB — RENAL FUNCTION PANEL
ALBUMIN: 3.4 g/dL — AB (ref 3.5–5.0)
ANION GAP: 12 (ref 5–15)
BUN: 66 mg/dL — AB (ref 6–20)
CO2: 25 mmol/L (ref 22–32)
Calcium: 8.5 mg/dL — ABNORMAL LOW (ref 8.9–10.3)
Chloride: 106 mmol/L (ref 101–111)
Creatinine, Ser: 4.01 mg/dL — ABNORMAL HIGH (ref 0.61–1.24)
GFR calc Af Amer: 15 mL/min — ABNORMAL LOW (ref >60)
GFR, EST NON AFRICAN AMERICAN: 13 mL/min — AB (ref >60)
Glucose, Bld: 122 mg/dL — ABNORMAL HIGH (ref 65–99)
PHOSPHORUS: 6.6 mg/dL — AB (ref 2.5–4.6)
POTASSIUM: 3.5 mmol/L (ref 3.5–5.1)
Sodium: 143 mmol/L (ref 135–145)

## 2017-12-27 LAB — FERRITIN: Ferritin: 1100 ng/mL — ABNORMAL HIGH (ref 24–336)

## 2017-12-27 LAB — IRON AND TIBC
Iron: 77 ug/dL (ref 45–182)
Saturation Ratios: 36 % (ref 17.9–39.5)
TIBC: 211 ug/dL — ABNORMAL LOW (ref 250–450)
UIBC: 134 ug/dL

## 2017-12-27 LAB — POCT HEMOGLOBIN-HEMACUE: Hemoglobin: 9.1 g/dL — ABNORMAL LOW (ref 13.0–17.0)

## 2017-12-27 MED ORDER — EPOETIN ALFA 20000 UNIT/ML IJ SOLN
20000.0000 [IU] | INTRAMUSCULAR | Status: DC
  Administered 2017-12-27: 20000 [IU] via SUBCUTANEOUS

## 2017-12-27 MED ORDER — EPOETIN ALFA 20000 UNIT/ML IJ SOLN
INTRAMUSCULAR | Status: AC
Start: 2017-12-27 — End: 2017-12-27
  Administered 2017-12-27: 20000 [IU] via SUBCUTANEOUS
  Filled 2017-12-27: qty 1

## 2017-12-28 LAB — PTH, INTACT AND CALCIUM
Calcium, Total (PTH): 8.3 mg/dL — ABNORMAL LOW (ref 8.6–10.2)
PTH: 60 pg/mL (ref 15–65)

## 2017-12-30 ENCOUNTER — Ambulatory Visit (INDEPENDENT_AMBULATORY_CARE_PROVIDER_SITE_OTHER): Payer: Medicare Other | Admitting: *Deleted

## 2017-12-30 ENCOUNTER — Telehealth: Payer: Self-pay | Admitting: Cardiology

## 2017-12-30 DIAGNOSIS — I480 Paroxysmal atrial fibrillation: Secondary | ICD-10-CM

## 2017-12-30 DIAGNOSIS — I4891 Unspecified atrial fibrillation: Secondary | ICD-10-CM | POA: Diagnosis not present

## 2017-12-30 DIAGNOSIS — R0789 Other chest pain: Secondary | ICD-10-CM

## 2017-12-30 DIAGNOSIS — Z7901 Long term (current) use of anticoagulants: Secondary | ICD-10-CM

## 2017-12-30 LAB — POCT INR: INR: 2.8 (ref 2.0–3.0)

## 2017-12-30 NOTE — Patient Instructions (Signed)
Description   Continue taking 1 tablet daily except 1/2 tablet on Sundays, Tuesdays and Thursdays. Recheck INR in 4 weeks.   Main # [phone] Cou[facility]in Clinic [phone].

## 2017-12-30 NOTE — Telephone Encounter (Signed)
New message      Pt was seen in the coumadin clinic today.  He needs an appt in July per recall but states that he needs to see Dr Stanford Breed because of blister-like bumps on his arms and hands.  He states this started when he started coumadin.  He is going to see his PCP, Dr Darlys Gales also.  Pt did not want to see an APP.  Can you call him and see if this is something he needs to see Dr Stanford Breed for?  Thanks

## 2017-12-30 NOTE — Telephone Encounter (Signed)
Unable to reach pt or leave a message  

## 2018-01-07 NOTE — Telephone Encounter (Signed)
Unable to reach pt or leave a message  

## 2018-01-10 ENCOUNTER — Ambulatory Visit (HOSPITAL_COMMUNITY)
Admission: RE | Admit: 2018-01-10 | Discharge: 2018-01-10 | Disposition: A | Discharge status: Home / Self Care | Payer: Medicare Other | Source: Ambulatory Visit | Attending: Nephrology | Admitting: Nephrology

## 2018-01-10 VITALS — BP 170/60 | HR 61 | Temp 98.0°F | Ht 68.0 in | Wt 178.0 lb

## 2018-01-10 DIAGNOSIS — N184 Chronic kidney disease, stage 4 (severe): Secondary | ICD-10-CM | POA: Diagnosis not present

## 2018-01-10 DIAGNOSIS — N185 Chronic kidney disease, stage 5: Secondary | ICD-10-CM

## 2018-01-10 DIAGNOSIS — D631 Anemia in chronic kidney disease: Secondary | ICD-10-CM | POA: Diagnosis present

## 2018-01-10 LAB — POCT HEMOGLOBIN-HEMACUE: Hemoglobin: 9.3 g/dL — ABNORMAL LOW (ref 13.0–17.0)

## 2018-01-10 MED ORDER — EPOETIN ALFA 20000 UNIT/ML IJ SOLN
INTRAMUSCULAR | Status: AC
  Administered 2018-01-10: 20000 [IU]
  Filled 2018-01-10: qty 1

## 2018-01-10 MED ORDER — EPOETIN ALFA 20000 UNIT/ML IJ SOLN: 20000.0000 [IU] | INTRAMUSCULAR | Status: DC

## 2018-01-23 ENCOUNTER — Other Ambulatory Visit (HOSPITAL_COMMUNITY): Payer: Self-pay | Admitting: *Deleted

## 2018-01-24 ENCOUNTER — Ambulatory Visit (HOSPITAL_COMMUNITY)
Admission: RE | Admit: 2018-01-24 | Discharge: 2018-01-24 | Disposition: A | Discharge status: Home / Self Care | Payer: Medicare Other | Source: Ambulatory Visit | Attending: Nephrology | Admitting: Nephrology

## 2018-01-24 VITALS — BP 132/63 | HR 53 | Temp 98.0°F

## 2018-01-24 DIAGNOSIS — N184 Chronic kidney disease, stage 4 (severe): Secondary | ICD-10-CM | POA: Insufficient documentation

## 2018-01-24 DIAGNOSIS — D631 Anemia in chronic kidney disease: Secondary | ICD-10-CM | POA: Diagnosis present

## 2018-01-24 DIAGNOSIS — N185 Chronic kidney disease, stage 5: Secondary | ICD-10-CM

## 2018-01-24 LAB — RENAL FUNCTION PANEL
ANION GAP: 10 (ref 5–15)
Albumin: 3.4 g/dL — ABNORMAL LOW (ref 3.5–5.0)
BUN: 57 mg/dL — AB (ref 8–23)
CALCIUM: 8.9 mg/dL (ref 8.9–10.3)
CO2: 28 mmol/L (ref 22–32)
CREATININE: 3.79 mg/dL — AB (ref 0.61–1.24)
Chloride: 106 mmol/L (ref 98–111)
GFR calc Af Amer: 16 mL/min — ABNORMAL LOW (ref >60)
GFR calc non Af Amer: 14 mL/min — ABNORMAL LOW (ref >60)
GLUCOSE: 101 mg/dL — AB (ref 70–99)
Phosphorus: 5.1 mg/dL — ABNORMAL HIGH (ref 2.5–4.6)
Potassium: 4.2 mmol/L (ref 3.5–5.1)
SODIUM: 144 mmol/L (ref 135–145)

## 2018-01-24 LAB — IRON AND TIBC
Iron: 45 ug/dL (ref 45–182)
SATURATION RATIOS: 22 % (ref 17.9–39.5)
TIBC: 209 ug/dL — AB (ref 250–450)
UIBC: 164 ug/dL

## 2018-01-24 LAB — FERRITIN: FERRITIN: 918 ng/mL — AB (ref 24–336)

## 2018-01-24 LAB — POCT HEMOGLOBIN-HEMACUE: Hemoglobin: 9.5 g/dL — ABNORMAL LOW (ref 13.0–17.0)

## 2018-01-24 MED ORDER — EPOETIN ALFA 20000 UNIT/ML IJ SOLN
20000.0000 [IU] | INTRAMUSCULAR | Status: DC
  Administered 2018-01-24: 20000 [IU] via SUBCUTANEOUS

## 2018-01-24 MED ORDER — EPOETIN ALFA 20000 UNIT/ML IJ SOLN
INTRAMUSCULAR | Status: AC
  Filled 2018-01-24: qty 1

## 2018-01-25 LAB — PTH, INTACT AND CALCIUM
Calcium, Total (PTH): 8.5 mg/dL — ABNORMAL LOW (ref 8.6–10.2)
PTH: 20 pg/mL (ref 15–65)

## 2018-01-27 ENCOUNTER — Ambulatory Visit (INDEPENDENT_AMBULATORY_CARE_PROVIDER_SITE_OTHER): Payer: Medicare Other | Admitting: *Deleted

## 2018-01-27 DIAGNOSIS — Z7901 Long term (current) use of anticoagulants: Secondary | ICD-10-CM | POA: Diagnosis not present

## 2018-01-27 DIAGNOSIS — I4891 Unspecified atrial fibrillation: Secondary | ICD-10-CM

## 2018-01-27 LAB — POCT INR: INR: 2.2 (ref 2.0–3.0)

## 2018-01-27 NOTE — Patient Instructions (Signed)
Description   Continue taking 1 tablet daily except 1/2 tablet on Sundays, Tuesdays and Thursdays. Recheck INR in 4 weeks.   Main # [phone] [facility] [phone].

## 2018-02-07 ENCOUNTER — Ambulatory Visit (HOSPITAL_COMMUNITY)
Admission: RE | Admit: 2018-02-07 | Discharge: 2018-02-07 | Disposition: A | Discharge status: Home / Self Care | Payer: Medicare Other | Source: Ambulatory Visit | Attending: Nephrology | Admitting: Nephrology

## 2018-02-07 VITALS — BP 169/53 | HR 61 | Temp 98.2°F

## 2018-02-07 DIAGNOSIS — N184 Chronic kidney disease, stage 4 (severe): Secondary | ICD-10-CM | POA: Insufficient documentation

## 2018-02-07 DIAGNOSIS — D631 Anemia in chronic kidney disease: Secondary | ICD-10-CM | POA: Diagnosis not present

## 2018-02-07 DIAGNOSIS — N185 Chronic kidney disease, stage 5: Secondary | ICD-10-CM

## 2018-02-07 LAB — POCT HEMOGLOBIN-HEMACUE: Hemoglobin: 9.4 g/dL — ABNORMAL LOW (ref 13.0–17.0)

## 2018-02-07 MED ORDER — EPOETIN ALFA 20000 UNIT/ML IJ SOLN
20000.0000 [IU] | INTRAMUSCULAR | Status: DC
  Administered 2018-02-07: 20000 [IU] via SUBCUTANEOUS

## 2018-02-07 MED ORDER — EPOETIN ALFA 20000 UNIT/ML IJ SOLN
INTRAMUSCULAR | Status: AC
  Filled 2018-02-07: qty 1

## 2018-02-21 ENCOUNTER — Ambulatory Visit (HOSPITAL_COMMUNITY)
Admission: RE | Admit: 2018-02-21 | Discharge: 2018-02-21 | Disposition: A | Discharge status: Home / Self Care | Payer: Medicare Other | Source: Ambulatory Visit | Attending: Nephrology | Admitting: Nephrology

## 2018-02-21 VITALS — BP 151/53 | HR 57 | Temp 97.8°F | Ht 68.0 in | Wt 172.0 lb

## 2018-02-21 DIAGNOSIS — D631 Anemia in chronic kidney disease: Secondary | ICD-10-CM | POA: Insufficient documentation

## 2018-02-21 DIAGNOSIS — N184 Chronic kidney disease, stage 4 (severe): Secondary | ICD-10-CM | POA: Insufficient documentation

## 2018-02-21 DIAGNOSIS — N185 Chronic kidney disease, stage 5: Secondary | ICD-10-CM

## 2018-02-21 LAB — POCT HEMOGLOBIN-HEMACUE: Hemoglobin: 9.4 g/dL — ABNORMAL LOW (ref 13.0–17.0)

## 2018-02-21 LAB — RENAL FUNCTION PANEL
ALBUMIN: 3.5 g/dL (ref 3.5–5.0)
Anion gap: 12 (ref 5–15)
BUN: 65 mg/dL — ABNORMAL HIGH (ref 8–23)
CALCIUM: 8.6 mg/dL — AB (ref 8.9–10.3)
CO2: 28 mmol/L (ref 22–32)
CREATININE: 4.22 mg/dL — AB (ref 0.61–1.24)
Chloride: 101 mmol/L (ref 98–111)
GFR, EST AFRICAN AMERICAN: 14 mL/min — AB (ref >60)
GFR, EST NON AFRICAN AMERICAN: 12 mL/min — AB (ref >60)
Glucose, Bld: 93 mg/dL (ref 70–99)
Phosphorus: 6.4 mg/dL — ABNORMAL HIGH (ref 2.5–4.6)
Potassium: 3.9 mmol/L (ref 3.5–5.1)
SODIUM: 141 mmol/L (ref 135–145)

## 2018-02-21 LAB — IRON AND TIBC
Iron: 72 ug/dL (ref 45–182)
Saturation Ratios: 29 % (ref 17.9–39.5)
TIBC: 248 ug/dL — AB (ref 250–450)
UIBC: 176 ug/dL

## 2018-02-21 LAB — FERRITIN: FERRITIN: 689 ng/mL — AB (ref 24–336)

## 2018-02-21 MED ORDER — EPOETIN ALFA 20000 UNIT/ML IJ SOLN
INTRAMUSCULAR | Status: AC
  Administered 2018-02-21: 20000 [IU] via SUBCUTANEOUS
  Filled 2018-02-21: qty 1

## 2018-02-21 MED ORDER — EPOETIN ALFA 20000 UNIT/ML IJ SOLN
20000.0000 [IU] | INTRAMUSCULAR | Status: DC
  Administered 2018-02-21: 20000 [IU] via SUBCUTANEOUS

## 2018-02-22 LAB — PTH, INTACT AND CALCIUM
CALCIUM TOTAL (PTH): 8.6 mg/dL (ref 8.6–10.2)
PTH: 96 pg/mL — AB (ref 15–65)

## 2018-02-24 ENCOUNTER — Ambulatory Visit: Payer: Medicare Other | Admitting: Pharmacist

## 2018-02-24 DIAGNOSIS — Z7901 Long term (current) use of anticoagulants: Secondary | ICD-10-CM | POA: Diagnosis not present

## 2018-02-24 LAB — POCT INR: INR: 2.9 (ref 2.0–3.0)

## 2018-02-24 NOTE — Patient Instructions (Signed)
Description   Continue taking 1 tablet daily except 1/2 tablet on Sundays, Tuesdays and Thursdays. Recheck INR in 5 weeks.   Main # [phone] [facility] [phone].

## 2018-03-07 ENCOUNTER — Encounter (HOSPITAL_COMMUNITY)
Admission: RE | Admit: 2018-03-07 | Discharge: 2018-03-07 | Disposition: A | Discharge status: Home / Self Care | Payer: Medicare Other | Source: Ambulatory Visit | Attending: Nephrology | Admitting: Nephrology

## 2018-03-07 VITALS — BP 147/50 | HR 57 | Temp 97.8°F | Ht 67.0 in | Wt 173.0 lb

## 2018-03-07 DIAGNOSIS — D631 Anemia in chronic kidney disease: Secondary | ICD-10-CM | POA: Diagnosis present

## 2018-03-07 DIAGNOSIS — N185 Chronic kidney disease, stage 5: Secondary | ICD-10-CM

## 2018-03-07 DIAGNOSIS — N184 Chronic kidney disease, stage 4 (severe): Secondary | ICD-10-CM | POA: Insufficient documentation

## 2018-03-07 LAB — POCT HEMOGLOBIN-HEMACUE: Hemoglobin: 9.6 g/dL — ABNORMAL LOW (ref 13.0–17.0)

## 2018-03-07 MED ORDER — EPOETIN ALFA 20000 UNIT/ML IJ SOLN
INTRAMUSCULAR | Status: AC
  Filled 2018-03-07: qty 1

## 2018-03-07 MED ORDER — EPOETIN ALFA 10000 UNIT/ML IJ SOLN
INTRAMUSCULAR | Status: AC
  Administered 2018-03-07: 20000 [IU]
  Filled 2018-03-07: qty 1

## 2018-03-07 MED ORDER — EPOETIN ALFA 20000 UNIT/ML IJ SOLN: 20000.0000 [IU] | INTRAMUSCULAR | Status: DC

## 2018-03-16 ENCOUNTER — Other Ambulatory Visit: Payer: Self-pay | Admitting: Cardiology

## 2018-03-16 DIAGNOSIS — R7989 Other specified abnormal findings of blood chemistry: Secondary | ICD-10-CM

## 2018-03-21 ENCOUNTER — Encounter (HOSPITAL_COMMUNITY): Payer: Medicare Other

## 2018-03-21 ENCOUNTER — Encounter (HOSPITAL_COMMUNITY)
Admission: RE | Admit: 2018-03-21 | Discharge: 2018-03-21 | Disposition: A | Discharge status: Home / Self Care | Payer: Medicare Other | Source: Ambulatory Visit | Attending: Nephrology | Admitting: Nephrology

## 2018-03-21 VITALS — BP 148/56 | HR 54 | Temp 97.7°F | Resp 20

## 2018-03-21 DIAGNOSIS — D631 Anemia in chronic kidney disease: Secondary | ICD-10-CM | POA: Diagnosis not present

## 2018-03-21 DIAGNOSIS — Z79899 Other long term (current) drug therapy: Secondary | ICD-10-CM | POA: Insufficient documentation

## 2018-03-21 DIAGNOSIS — N184 Chronic kidney disease, stage 4 (severe): Secondary | ICD-10-CM | POA: Diagnosis not present

## 2018-03-21 DIAGNOSIS — Z5181 Encounter for therapeutic drug level monitoring: Secondary | ICD-10-CM | POA: Diagnosis not present

## 2018-03-21 DIAGNOSIS — N185 Chronic kidney disease, stage 5: Secondary | ICD-10-CM

## 2018-03-21 LAB — RENAL FUNCTION PANEL
Albumin: 3.5 g/dL (ref 3.5–5.0)
Anion gap: 12 (ref 5–15)
BUN: 70 mg/dL — ABNORMAL HIGH (ref 8–23)
CHLORIDE: 106 mmol/L (ref 98–111)
CO2: 25 mmol/L (ref 22–32)
Calcium: 8.9 mg/dL (ref 8.9–10.3)
Creatinine, Ser: 4.23 mg/dL — ABNORMAL HIGH (ref 0.61–1.24)
GFR, EST AFRICAN AMERICAN: 14 mL/min — AB (ref >60)
GFR, EST NON AFRICAN AMERICAN: 12 mL/min — AB (ref >60)
Glucose, Bld: 79 mg/dL (ref 70–99)
Phosphorus: 6.1 mg/dL — ABNORMAL HIGH (ref 2.5–4.6)
Potassium: 3.6 mmol/L (ref 3.5–5.1)
Sodium: 143 mmol/L (ref 135–145)

## 2018-03-21 LAB — FERRITIN: FERRITIN: 847 ng/mL — AB (ref 24–336)

## 2018-03-21 LAB — IRON AND TIBC
IRON: 67 ug/dL (ref 45–182)
Saturation Ratios: 28 % (ref 17.9–39.5)
TIBC: 239 ug/dL — ABNORMAL LOW (ref 250–450)
UIBC: 172 ug/dL

## 2018-03-21 LAB — POCT HEMOGLOBIN-HEMACUE: HEMOGLOBIN: 9.5 g/dL — AB (ref 13.0–17.0)

## 2018-03-21 MED ORDER — EPOETIN ALFA 20000 UNIT/ML IJ SOLN
20000.0000 [IU] | INTRAMUSCULAR | Status: DC
  Administered 2018-03-21: 20000 [IU] via SUBCUTANEOUS

## 2018-03-21 MED ORDER — EPOETIN ALFA 20000 UNIT/ML IJ SOLN
INTRAMUSCULAR | Status: AC
  Administered 2018-03-21: 20000 [IU] via SUBCUTANEOUS
  Filled 2018-03-21: qty 1

## 2018-03-22 LAB — PTH, INTACT AND CALCIUM
Calcium, Total (PTH): 8.6 mg/dL (ref 8.6–10.2)
PTH: 63 pg/mL (ref 15–65)

## 2018-04-01 ENCOUNTER — Ambulatory Visit (INDEPENDENT_AMBULATORY_CARE_PROVIDER_SITE_OTHER): Payer: Medicare Other

## 2018-04-01 DIAGNOSIS — Z7901 Long term (current) use of anticoagulants: Secondary | ICD-10-CM | POA: Diagnosis not present

## 2018-04-01 DIAGNOSIS — I4891 Unspecified atrial fibrillation: Secondary | ICD-10-CM

## 2018-04-01 LAB — POCT INR
INR: 2.5 (ref 2.0–3.0)
INR: 3 (ref 2.0–3.0)

## 2018-04-01 NOTE — Patient Instructions (Signed)
Please have a large serving of greens today and continue taking 1 tablet daily except 1/2 tablet on Sundays, Tuesdays and Thursdays. Recheck INR in 6 weeks.   Main # [phone] [facility]in Clinic [phone].

## 2018-04-04 ENCOUNTER — Ambulatory Visit (HOSPITAL_COMMUNITY)
Admission: RE | Admit: 2018-04-04 | Discharge: 2018-04-04 | Disposition: A | Discharge status: Home / Self Care | Payer: Medicare Other | Source: Ambulatory Visit | Attending: Nephrology | Admitting: Nephrology

## 2018-04-04 VITALS — BP 134/48 | HR 61 | Temp 97.8°F | Resp 18

## 2018-04-04 DIAGNOSIS — D631 Anemia in chronic kidney disease: Secondary | ICD-10-CM | POA: Diagnosis not present

## 2018-04-04 DIAGNOSIS — N185 Chronic kidney disease, stage 5: Secondary | ICD-10-CM

## 2018-04-04 LAB — POCT HEMOGLOBIN-HEMACUE: Hemoglobin: 8.9 g/dL — ABNORMAL LOW (ref 13.0–17.0)

## 2018-04-04 MED ORDER — EPOETIN ALFA 20000 UNIT/ML IJ SOLN
20000.0000 [IU] | INTRAMUSCULAR | Status: DC
  Administered 2018-04-04: 20000 [IU] via SUBCUTANEOUS

## 2018-04-04 MED ORDER — EPOETIN ALFA 20000 UNIT/ML IJ SOLN
INTRAMUSCULAR | Status: AC
  Filled 2018-04-04: qty 1

## 2018-04-09 ENCOUNTER — Encounter (HOSPITAL_COMMUNITY): Payer: Self-pay | Admitting: Emergency Medicine

## 2018-04-09 ENCOUNTER — Ambulatory Visit (HOSPITAL_COMMUNITY)
Admission: EM | Admit: 2018-04-09 | Discharge: 2018-04-09 | Disposition: A | Discharge status: Home / Self Care | Payer: Medicare Other | Attending: Family Medicine | Admitting: Family Medicine

## 2018-04-09 ENCOUNTER — Other Ambulatory Visit: Payer: Self-pay

## 2018-04-09 ENCOUNTER — Ambulatory Visit (INDEPENDENT_AMBULATORY_CARE_PROVIDER_SITE_OTHER): Payer: Medicare Other | Admitting: *Deleted

## 2018-04-09 DIAGNOSIS — I4891 Unspecified atrial fibrillation: Secondary | ICD-10-CM | POA: Diagnosis not present

## 2018-04-09 DIAGNOSIS — T148XXA Other injury of unspecified body region, initial encounter: Secondary | ICD-10-CM

## 2018-04-09 DIAGNOSIS — Z7901 Long term (current) use of anticoagulants: Secondary | ICD-10-CM

## 2018-04-09 DIAGNOSIS — S50811A Abrasion of right forearm, initial encounter: Secondary | ICD-10-CM | POA: Diagnosis not present

## 2018-04-09 DIAGNOSIS — W548XXA Other contact with dog, initial encounter: Secondary | ICD-10-CM

## 2018-04-09 LAB — POCT I-STAT, CHEM 8
BUN: 76 mg/dL — ABNORMAL HIGH (ref 8–23)
Calcium, Ion: 1.15 mmol/L (ref 1.15–1.40)
Chloride: 109 mmol/L (ref 98–111)
Creatinine, Ser: 4 mg/dL — ABNORMAL HIGH (ref 0.61–1.24)
Glucose, Bld: 110 mg/dL — ABNORMAL HIGH (ref 70–99)
HCT: 28 % — ABNORMAL LOW (ref 39.0–52.0)
Hemoglobin: 9.5 g/dL — ABNORMAL LOW (ref 13.0–17.0)
Potassium: 3.6 mmol/L (ref 3.5–5.1)
Sodium: 143 mmol/L (ref 135–145)
TCO2: 23 mmol/L (ref 22–32)

## 2018-04-09 LAB — POCT INR: INR: 3.8 — AB (ref 2.0–3.0)

## 2018-04-09 NOTE — Discharge Instructions (Addendum)
Declines tetanus booster today Bandage applied Hemoglobin 9.5.  Improved from hemoglobin on 04/04/18.   Follow up with coumadin clinic this week to have INR rechecked.   Follow up with PCP next week for reevaluation  Return or go to the ED immediately if you have continue saturation of bandage every 5-10 minutes, feeling lightheadedness, dizziness, heart racing, shortness of breath, etc..

## 2018-04-09 NOTE — Patient Instructions (Signed)
Description   Skip today's dose, then continue taking 1 tablet daily except 1/2 tablet on Sundays, Tuesdays and Thursdays. Eat a large serving today of leafy green vegetable.  Recheck INR in 1 week.   Main # [phone] Coumadin Clinic [phone].

## 2018-04-09 NOTE — ED Triage Notes (Signed)
Small cut to right arm from his dog's toenail.  This occurred last Friday.  Patient's wound will not stop bleeding.
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
## 2018-04-15 ENCOUNTER — Ambulatory Visit: Payer: Medicare Other | Admitting: *Deleted

## 2018-04-15 DIAGNOSIS — Z7901 Long term (current) use of anticoagulants: Secondary | ICD-10-CM

## 2018-04-15 DIAGNOSIS — I4891 Unspecified atrial fibrillation: Secondary | ICD-10-CM | POA: Diagnosis not present

## 2018-04-15 LAB — POCT INR: INR: 3.8 — AB (ref 2.0–3.0)

## 2018-04-15 NOTE — Patient Instructions (Signed)
Description   Skip today's dose, then change your dose to 1/2 tablet daily except 1 tablet on Mondays, Wednesdays and Fridays. Try to eat your normal  leafy green vegetable.  Recheck INR in 2 weeks.   Main # [phone] Coumadin Clinic [phone].

## 2018-04-17 ENCOUNTER — Other Ambulatory Visit (HOSPITAL_COMMUNITY): Payer: Self-pay

## 2018-04-18 ENCOUNTER — Emergency Department (HOSPITAL_COMMUNITY): Payer: Medicare Other

## 2018-04-18 ENCOUNTER — Inpatient Hospital Stay (HOSPITAL_COMMUNITY)
Admission: EM | Admit: 2018-04-18 | Discharge: 2018-05-08 | DRG: 246 | Disposition: A | Discharge status: Skilled Nursing Facility | Payer: Medicare Other | Attending: Internal Medicine | Admitting: Internal Medicine

## 2018-04-18 ENCOUNTER — Encounter (HOSPITAL_COMMUNITY): Payer: Medicare Other

## 2018-04-18 ENCOUNTER — Encounter (HOSPITAL_COMMUNITY): Payer: Self-pay | Admitting: Emergency Medicine

## 2018-04-18 ENCOUNTER — Other Ambulatory Visit: Payer: Self-pay

## 2018-04-18 DIAGNOSIS — Z992 Dependence on renal dialysis: Secondary | ICD-10-CM

## 2018-04-18 DIAGNOSIS — I272 Pulmonary hypertension, unspecified: Secondary | ICD-10-CM | POA: Diagnosis present

## 2018-04-18 DIAGNOSIS — M79661 Pain in right lower leg: Secondary | ICD-10-CM | POA: Diagnosis not present

## 2018-04-18 DIAGNOSIS — R296 Repeated falls: Secondary | ICD-10-CM | POA: Diagnosis present

## 2018-04-18 DIAGNOSIS — Z8371 Family history of colonic polyps: Secondary | ICD-10-CM

## 2018-04-18 DIAGNOSIS — Y92230 Patient room in hospital as the place of occurrence of the external cause: Secondary | ICD-10-CM | POA: Diagnosis present

## 2018-04-18 DIAGNOSIS — I739 Peripheral vascular disease, unspecified: Secondary | ICD-10-CM | POA: Diagnosis present

## 2018-04-18 DIAGNOSIS — I214 Non-ST elevation (NSTEMI) myocardial infarction: Secondary | ICD-10-CM | POA: Diagnosis present

## 2018-04-18 DIAGNOSIS — E872 Acidosis: Secondary | ICD-10-CM | POA: Diagnosis present

## 2018-04-18 DIAGNOSIS — Z79899 Other long term (current) drug therapy: Secondary | ICD-10-CM

## 2018-04-18 DIAGNOSIS — Y718 Miscellaneous cardiovascular devices associated with adverse incidents, not elsewhere classified: Secondary | ICD-10-CM | POA: Diagnosis present

## 2018-04-18 DIAGNOSIS — S40021A Contusion of right upper arm, initial encounter: Secondary | ICD-10-CM | POA: Diagnosis not present

## 2018-04-18 DIAGNOSIS — I447 Left bundle-branch block, unspecified: Secondary | ICD-10-CM | POA: Diagnosis present

## 2018-04-18 DIAGNOSIS — I132 Hypertensive heart and chronic kidney disease with heart failure and with stage 5 chronic kidney disease, or end stage renal disease: Secondary | ICD-10-CM | POA: Diagnosis present

## 2018-04-18 DIAGNOSIS — Z8673 Personal history of transient ischemic attack (TIA), and cerebral infarction without residual deficits: Secondary | ICD-10-CM

## 2018-04-18 DIAGNOSIS — I255 Ischemic cardiomyopathy: Secondary | ICD-10-CM | POA: Diagnosis present

## 2018-04-18 DIAGNOSIS — N185 Chronic kidney disease, stage 5: Secondary | ICD-10-CM | POA: Diagnosis present

## 2018-04-18 DIAGNOSIS — I454 Nonspecific intraventricular block: Secondary | ICD-10-CM | POA: Diagnosis present

## 2018-04-18 DIAGNOSIS — K0889 Other specified disorders of teeth and supporting structures: Secondary | ICD-10-CM | POA: Diagnosis present

## 2018-04-18 DIAGNOSIS — E876 Hypokalemia: Secondary | ICD-10-CM | POA: Diagnosis not present

## 2018-04-18 DIAGNOSIS — X58XXXA Exposure to other specified factors, initial encounter: Secondary | ICD-10-CM | POA: Diagnosis not present

## 2018-04-18 DIAGNOSIS — N186 End stage renal disease: Secondary | ICD-10-CM | POA: Diagnosis present

## 2018-04-18 DIAGNOSIS — T82838A Hemorrhage of vascular prosthetic devices, implants and grafts, initial encounter: Secondary | ICD-10-CM | POA: Diagnosis present

## 2018-04-18 DIAGNOSIS — D72829 Elevated white blood cell count, unspecified: Secondary | ICD-10-CM | POA: Diagnosis not present

## 2018-04-18 DIAGNOSIS — I480 Paroxysmal atrial fibrillation: Secondary | ICD-10-CM | POA: Diagnosis present

## 2018-04-18 DIAGNOSIS — Z882 Allergy status to sulfonamides status: Secondary | ICD-10-CM

## 2018-04-18 DIAGNOSIS — I724 Aneurysm of artery of lower extremity: Secondary | ICD-10-CM | POA: Diagnosis not present

## 2018-04-18 DIAGNOSIS — M10071 Idiopathic gout, right ankle and foot: Secondary | ICD-10-CM | POA: Diagnosis present

## 2018-04-18 DIAGNOSIS — Z887 Allergy status to serum and vaccine status: Secondary | ICD-10-CM

## 2018-04-18 DIAGNOSIS — R791 Abnormal coagulation profile: Secondary | ICD-10-CM | POA: Diagnosis present

## 2018-04-18 DIAGNOSIS — E46 Unspecified protein-calorie malnutrition: Secondary | ICD-10-CM | POA: Diagnosis present

## 2018-04-18 DIAGNOSIS — I420 Dilated cardiomyopathy: Secondary | ICD-10-CM | POA: Diagnosis present

## 2018-04-18 DIAGNOSIS — Z880 Allergy status to penicillin: Secondary | ICD-10-CM

## 2018-04-18 DIAGNOSIS — Z6825 Body mass index (BMI) 25.0-25.9, adult: Secondary | ICD-10-CM

## 2018-04-18 DIAGNOSIS — Z515 Encounter for palliative care: Secondary | ICD-10-CM | POA: Diagnosis not present

## 2018-04-18 DIAGNOSIS — I729 Aneurysm of unspecified site: Secondary | ICD-10-CM | POA: Diagnosis not present

## 2018-04-18 DIAGNOSIS — L03114 Cellulitis of left upper limb: Secondary | ICD-10-CM | POA: Diagnosis present

## 2018-04-18 DIAGNOSIS — I340 Nonrheumatic mitral (valve) insufficiency: Secondary | ICD-10-CM | POA: Diagnosis present

## 2018-04-18 DIAGNOSIS — Z9841 Cataract extraction status, right eye: Secondary | ICD-10-CM

## 2018-04-18 DIAGNOSIS — I5022 Chronic systolic (congestive) heart failure: Secondary | ICD-10-CM | POA: Diagnosis not present

## 2018-04-18 DIAGNOSIS — E669 Obesity, unspecified: Secondary | ICD-10-CM | POA: Diagnosis present

## 2018-04-18 DIAGNOSIS — E785 Hyperlipidemia, unspecified: Secondary | ICD-10-CM | POA: Diagnosis present

## 2018-04-18 DIAGNOSIS — T82898A Other specified complication of vascular prosthetic devices, implants and grafts, initial encounter: Secondary | ICD-10-CM | POA: Diagnosis not present

## 2018-04-18 DIAGNOSIS — I9589 Other hypotension: Secondary | ICD-10-CM | POA: Diagnosis not present

## 2018-04-18 DIAGNOSIS — H9190 Unspecified hearing loss, unspecified ear: Secondary | ICD-10-CM | POA: Diagnosis present

## 2018-04-18 DIAGNOSIS — I2510 Atherosclerotic heart disease of native coronary artery without angina pectoris: Secondary | ICD-10-CM | POA: Diagnosis present

## 2018-04-18 DIAGNOSIS — Y838 Other surgical procedures as the cause of abnormal reaction of the patient, or of later complication, without mention of misadventure at the time of the procedure: Secondary | ICD-10-CM | POA: Diagnosis not present

## 2018-04-18 DIAGNOSIS — I219 Acute myocardial infarction, unspecified: Secondary | ICD-10-CM | POA: Diagnosis present

## 2018-04-18 DIAGNOSIS — N2581 Secondary hyperparathyroidism of renal origin: Secondary | ICD-10-CM | POA: Diagnosis present

## 2018-04-18 DIAGNOSIS — I5043 Acute on chronic combined systolic (congestive) and diastolic (congestive) heart failure: Secondary | ICD-10-CM | POA: Diagnosis present

## 2018-04-18 DIAGNOSIS — M7989 Other specified soft tissue disorders: Secondary | ICD-10-CM | POA: Diagnosis not present

## 2018-04-18 DIAGNOSIS — Z9842 Cataract extraction status, left eye: Secondary | ICD-10-CM

## 2018-04-18 DIAGNOSIS — Z79890 Hormone replacement therapy (postmenopausal): Secondary | ICD-10-CM

## 2018-04-18 DIAGNOSIS — I701 Atherosclerosis of renal artery: Secondary | ICD-10-CM | POA: Diagnosis present

## 2018-04-18 DIAGNOSIS — N179 Acute kidney failure, unspecified: Secondary | ICD-10-CM | POA: Diagnosis present

## 2018-04-18 DIAGNOSIS — R0789 Other chest pain: Secondary | ICD-10-CM | POA: Diagnosis present

## 2018-04-18 DIAGNOSIS — T82590A Other mechanical complication of surgically created arteriovenous fistula, initial encounter: Secondary | ICD-10-CM | POA: Diagnosis not present

## 2018-04-18 DIAGNOSIS — F329 Major depressive disorder, single episode, unspecified: Secondary | ICD-10-CM | POA: Diagnosis present

## 2018-04-18 DIAGNOSIS — D631 Anemia in chronic kidney disease: Secondary | ICD-10-CM | POA: Diagnosis present

## 2018-04-18 DIAGNOSIS — G92 Toxic encephalopathy: Secondary | ICD-10-CM | POA: Diagnosis present

## 2018-04-18 DIAGNOSIS — Y92234 Operating room of hospital as the place of occurrence of the external cause: Secondary | ICD-10-CM | POA: Diagnosis not present

## 2018-04-18 DIAGNOSIS — E039 Hypothyroidism, unspecified: Secondary | ICD-10-CM | POA: Diagnosis present

## 2018-04-18 DIAGNOSIS — D62 Acute posthemorrhagic anemia: Secondary | ICD-10-CM | POA: Diagnosis not present

## 2018-04-18 DIAGNOSIS — T148XXA Other injury of unspecified body region, initial encounter: Secondary | ICD-10-CM | POA: Diagnosis not present

## 2018-04-18 DIAGNOSIS — I509 Heart failure, unspecified: Secondary | ICD-10-CM

## 2018-04-18 DIAGNOSIS — T81718A Complication of other artery following a procedure, not elsewhere classified, initial encounter: Secondary | ICD-10-CM | POA: Diagnosis not present

## 2018-04-18 DIAGNOSIS — Z961 Presence of intraocular lens: Secondary | ICD-10-CM | POA: Diagnosis present

## 2018-04-18 DIAGNOSIS — I2101 ST elevation (STEMI) myocardial infarction involving left main coronary artery: Secondary | ICD-10-CM | POA: Diagnosis not present

## 2018-04-18 DIAGNOSIS — I5021 Acute systolic (congestive) heart failure: Secondary | ICD-10-CM | POA: Diagnosis not present

## 2018-04-18 DIAGNOSIS — Z95828 Presence of other vascular implants and grafts: Secondary | ICD-10-CM

## 2018-04-18 DIAGNOSIS — Z8249 Family history of ischemic heart disease and other diseases of the circulatory system: Secondary | ICD-10-CM

## 2018-04-18 DIAGNOSIS — Z419 Encounter for procedure for purposes other than remedying health state, unspecified: Secondary | ICD-10-CM

## 2018-04-18 DIAGNOSIS — Z9852 Vasectomy status: Secondary | ICD-10-CM

## 2018-04-18 DIAGNOSIS — I5023 Acute on chronic systolic (congestive) heart failure: Secondary | ICD-10-CM | POA: Diagnosis present

## 2018-04-18 DIAGNOSIS — Z8349 Family history of other endocrine, nutritional and metabolic diseases: Secondary | ICD-10-CM

## 2018-04-18 DIAGNOSIS — I25119 Atherosclerotic heart disease of native coronary artery with unspecified angina pectoris: Secondary | ICD-10-CM | POA: Diagnosis not present

## 2018-04-18 DIAGNOSIS — R58 Hemorrhage, not elsewhere classified: Secondary | ICD-10-CM | POA: Diagnosis not present

## 2018-04-18 DIAGNOSIS — I370 Nonrheumatic pulmonary valve stenosis: Secondary | ICD-10-CM | POA: Diagnosis not present

## 2018-04-18 DIAGNOSIS — Z7189 Other specified counseling: Secondary | ICD-10-CM

## 2018-04-18 DIAGNOSIS — Z7901 Long term (current) use of anticoagulants: Secondary | ICD-10-CM

## 2018-04-18 DIAGNOSIS — Z87442 Personal history of urinary calculi: Secondary | ICD-10-CM

## 2018-04-18 DIAGNOSIS — Z87891 Personal history of nicotine dependence: Secondary | ICD-10-CM

## 2018-04-18 DIAGNOSIS — Z95820 Peripheral vascular angioplasty status with implants and grafts: Secondary | ICD-10-CM

## 2018-04-18 DIAGNOSIS — I361 Nonrheumatic tricuspid (valve) insufficiency: Secondary | ICD-10-CM | POA: Diagnosis not present

## 2018-04-18 LAB — BASIC METABOLIC PANEL
Anion gap: 24 — ABNORMAL HIGH (ref 5–15)
BUN: 186 mg/dL — ABNORMAL HIGH (ref 8–23)
CO2: 14 mmol/L — ABNORMAL LOW (ref 22–32)
Calcium: 8.4 mg/dL — ABNORMAL LOW (ref 8.9–10.3)
Chloride: 100 mmol/L (ref 98–111)
Creatinine, Ser: 9.51 mg/dL — ABNORMAL HIGH (ref 0.61–1.24)
GFR calc Af Amer: 5 mL/min — ABNORMAL LOW (ref >60)
GFR calc non Af Amer: 5 mL/min — ABNORMAL LOW (ref >60)
Glucose, Bld: 137 mg/dL — ABNORMAL HIGH (ref 70–99)
Potassium: 4.2 mmol/L (ref 3.5–5.1)
Sodium: 138 mmol/L (ref 135–145)

## 2018-04-18 LAB — MRSA PCR SCREENING: MRSA by PCR: NEGATIVE

## 2018-04-18 LAB — CBC
HCT: 25.6 % — ABNORMAL LOW (ref 39.0–52.0)
Hemoglobin: 7.9 g/dL — ABNORMAL LOW (ref 13.0–17.0)
MCH: 31.7 pg (ref 26.0–34.0)
MCHC: 30.9 g/dL (ref 30.0–36.0)
MCV: 102.8 fL — ABNORMAL HIGH (ref 80.0–100.0)
Platelets: 280 10*3/uL (ref 150–400)
RBC: 2.49 MIL/uL — ABNORMAL LOW (ref 4.22–5.81)
RDW: 18.8 % — ABNORMAL HIGH (ref 11.5–15.5)
WBC: 24.2 10*3/uL — ABNORMAL HIGH (ref 4.0–10.5)
nRBC: 0.9 % — ABNORMAL HIGH (ref 0.0–0.2)

## 2018-04-18 LAB — PROTIME-INR
INR: 4.56
Prothrombin Time: 42.9 seconds — ABNORMAL HIGH (ref 11.4–15.2)

## 2018-04-18 LAB — HEPATITIS B SURFACE ANTIGEN: Hepatitis B Surface Ag: NEGATIVE

## 2018-04-18 LAB — I-STAT TROPONIN, ED: Troponin i, poc: 8.79 ng/mL (ref 0.00–0.08)

## 2018-04-18 LAB — BRAIN NATRIURETIC PEPTIDE: B Natriuretic Peptide: >4500 pg/mL — ABNORMAL HIGH (ref 0.0–100.0)

## 2018-04-18 LAB — PREPARE RBC (CROSSMATCH)

## 2018-04-18 LAB — TSH: TSH: 5.845 u[IU]/mL — AB (ref 0.350–4.500)

## 2018-04-18 IMAGING — CR DG CHEST 2V
2 series · 2 of 2 positions shown · non-contrast
Comparison: [DATE]

CLINICAL DATA: Chest pain and shortness of breath

EXAM:
CHEST - 2 VIEW

[chest lat]
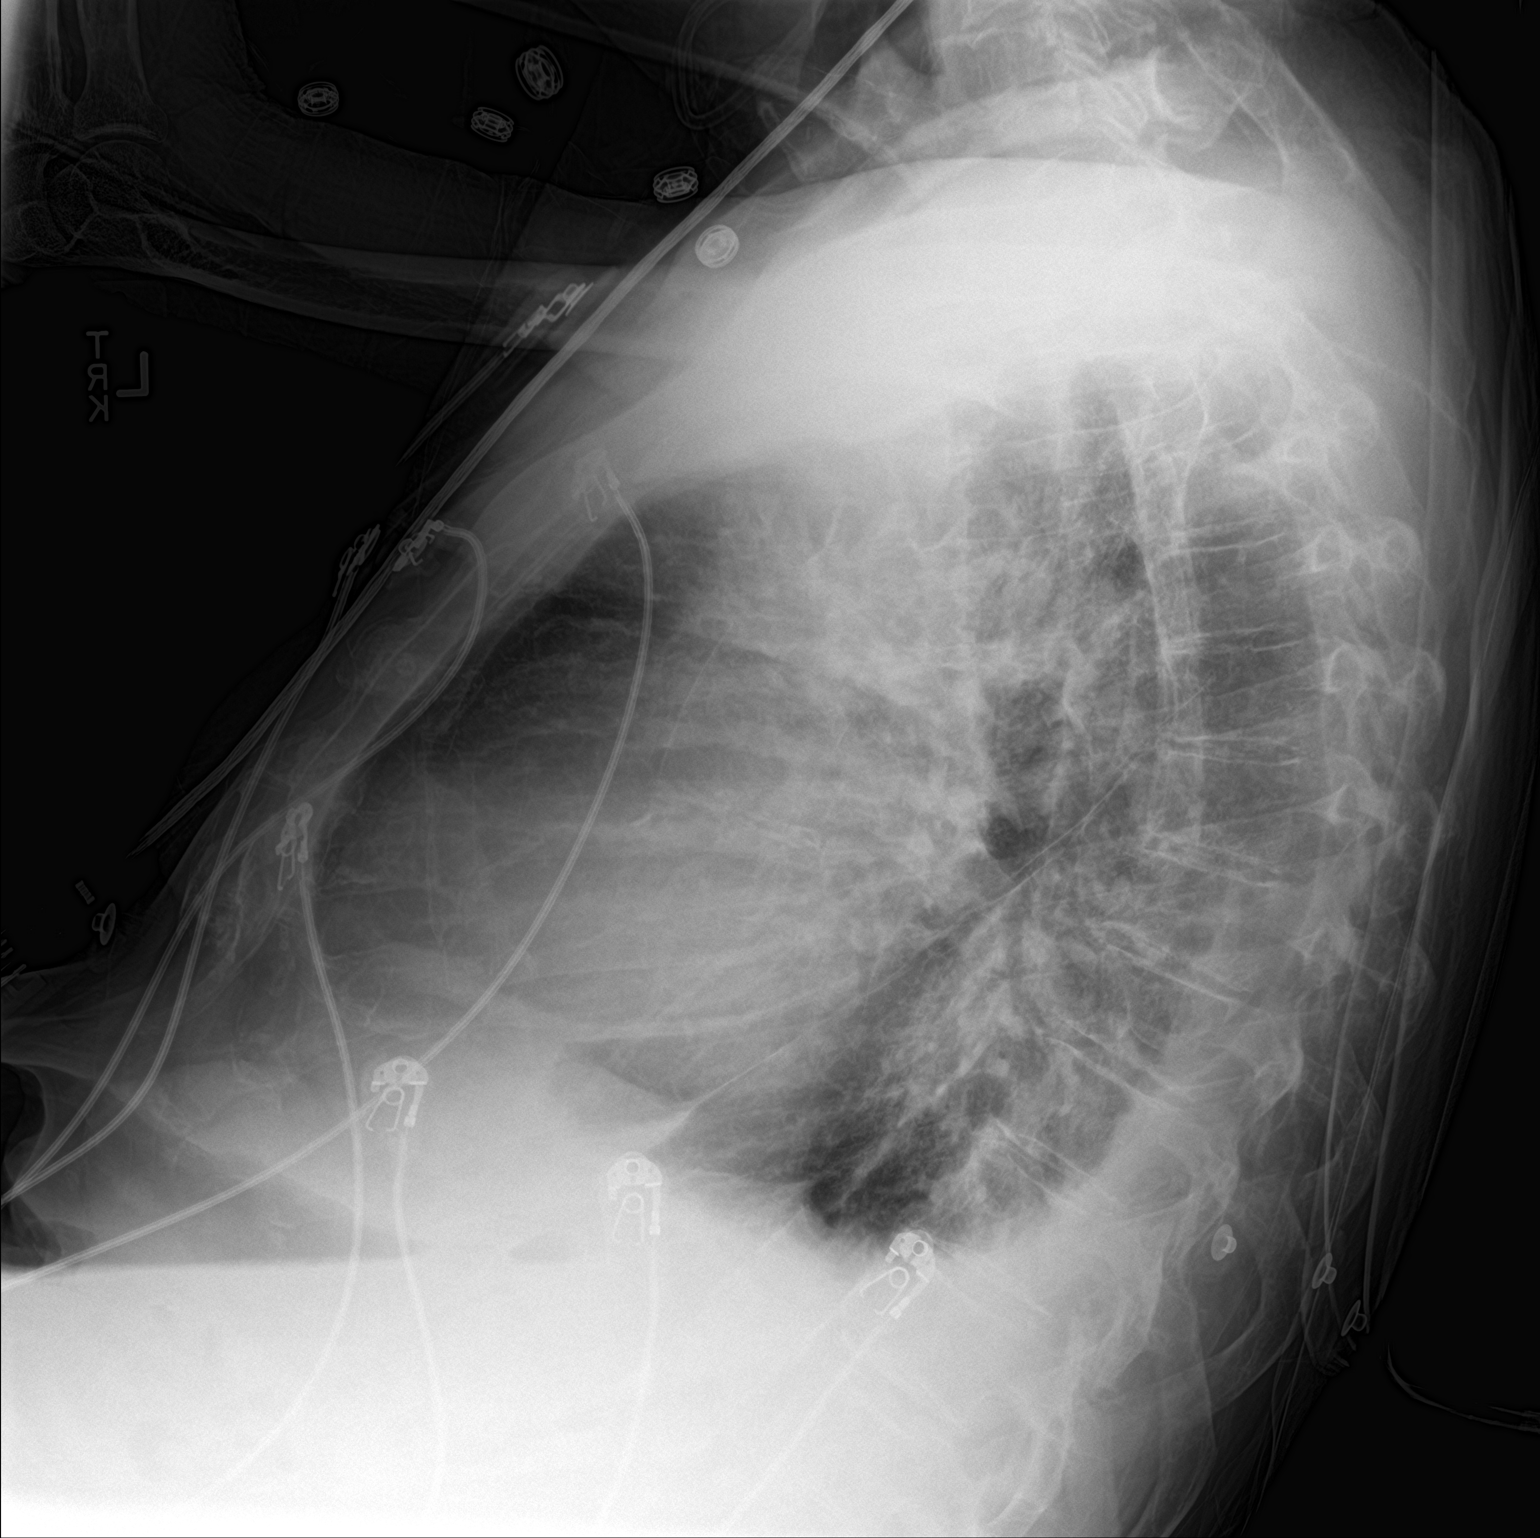

[chest ap]
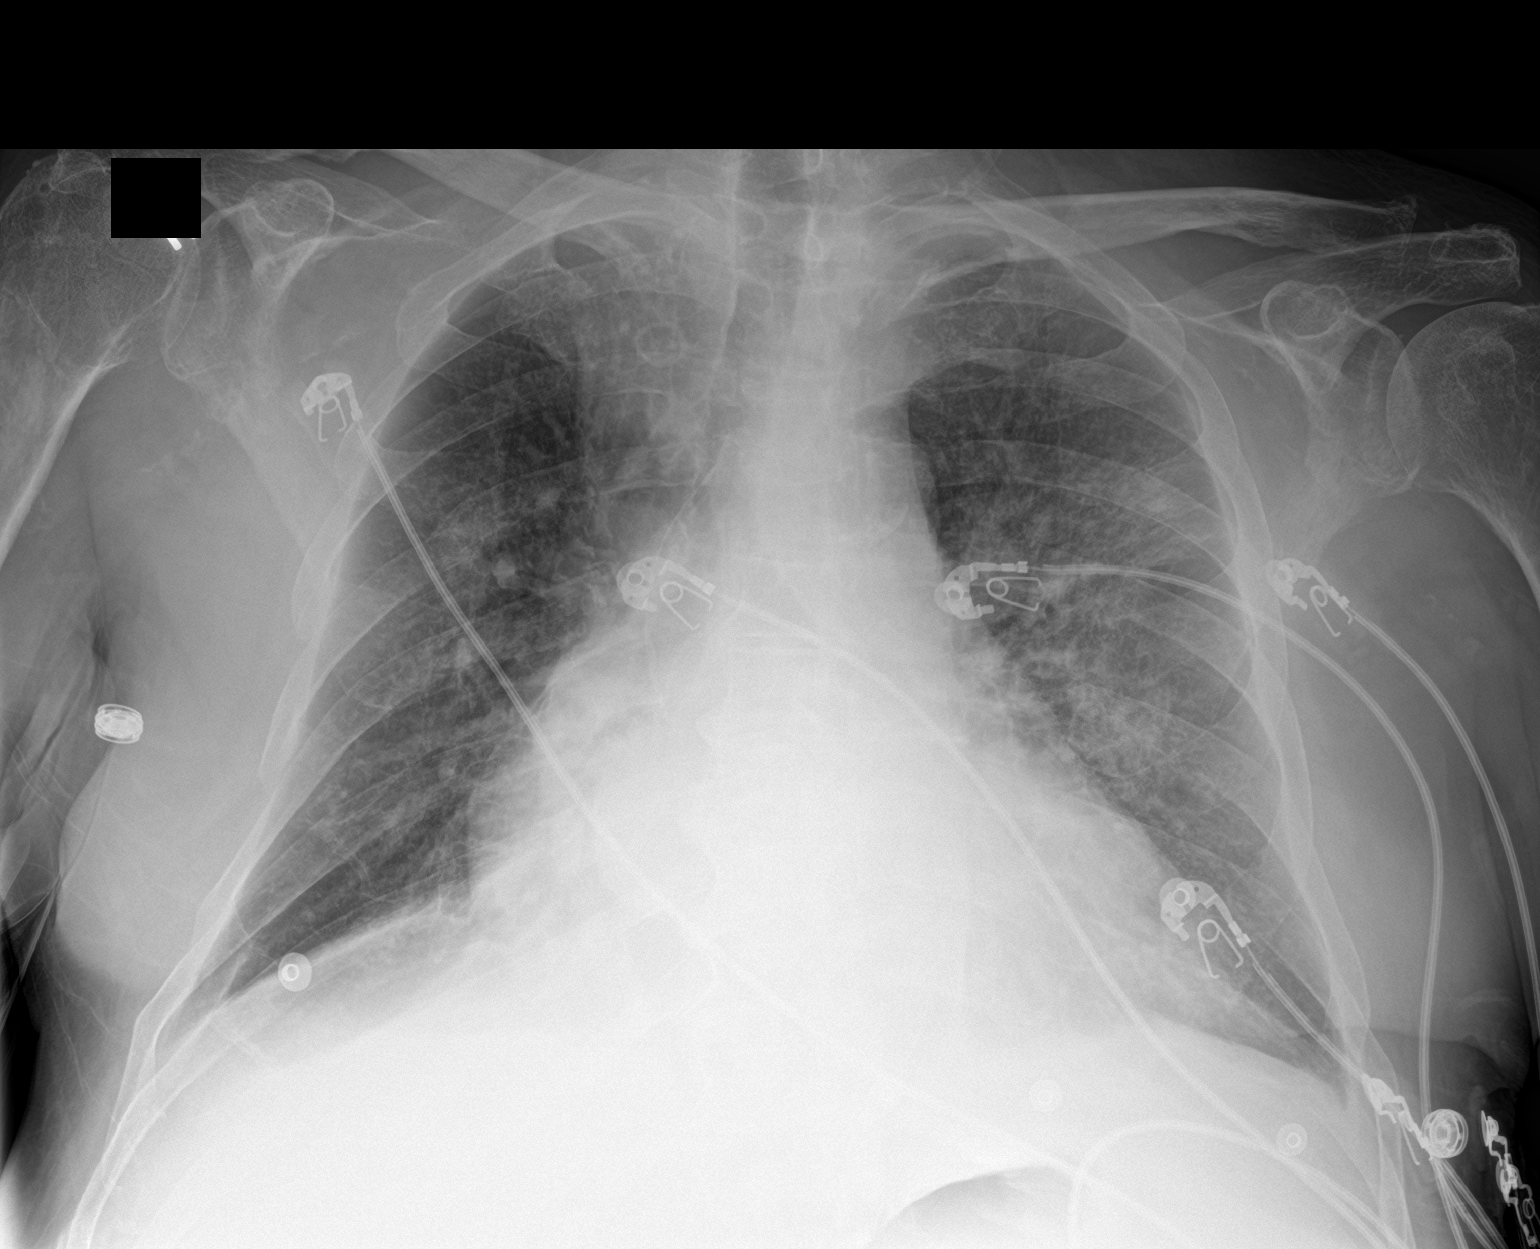

[2 of 2 positions shown; findings below may reference images not displayed]

FINDINGS: The mediastinal contour is normal. Heart size is enlarged. There is
pulmonary edema. There are small bilateral posterior pleural
effusions. No focal pneumonia is identified. The visualized skeletal
structures are stable.
IMPRESSION: Congestive heart failure.

## 2018-04-18 MED ORDER — DARBEPOETIN ALFA 60 MCG/0.3ML IJ SOSY
60.0000 ug | PREFILLED_SYRINGE | INTRAMUSCULAR | Status: DC
  Administered 2018-04-18 (×2): 60 ug via SUBCUTANEOUS

## 2018-04-18 MED ORDER — ALTEPLASE 2 MG IJ SOLR: 2.0000 mg | Freq: Once | INTRAMUSCULAR | Status: DC | PRN

## 2018-04-18 MED ORDER — AMIODARONE HCL 200 MG PO TABS
200.0000 mg | ORAL_TABLET | Freq: Every day | ORAL | Status: DC
  Administered 2018-04-19 – 2018-05-08 (×19): 200 mg via ORAL
  Filled 2018-04-18 (×19): qty 1

## 2018-04-18 MED ORDER — SODIUM CHLORIDE 0.9% IV SOLUTION: Freq: Once | INTRAVENOUS | Status: DC

## 2018-04-18 MED ORDER — CHLORHEXIDINE GLUCONATE CLOTH 2 % EX PADS
6.0000 | MEDICATED_PAD | Freq: Every day | CUTANEOUS | Status: DC
  Administered 2018-04-21: 6 via TOPICAL

## 2018-04-18 MED ORDER — LIDOCAINE-PRILOCAINE 2.5-2.5 % EX CREA: 1.0000 "application " | TOPICAL_CREAM | CUTANEOUS | Status: DC | PRN

## 2018-04-18 MED ORDER — ONDANSETRON HCL 4 MG PO TABS: 4.0000 mg | ORAL_TABLET | Freq: Four times a day (QID) | ORAL | Status: DC | PRN

## 2018-04-18 MED ORDER — ATORVASTATIN CALCIUM 20 MG PO TABS
20.0000 mg | ORAL_TABLET | Freq: Every day | ORAL | Status: DC
  Administered 2018-04-19 – 2018-05-07 (×19): 20 mg via ORAL
  Filled 2018-04-18 (×19): qty 1

## 2018-04-18 MED ORDER — SODIUM CHLORIDE 0.9 % IV SOLN: 100.0000 mL | INTRAVENOUS | Status: DC | PRN

## 2018-04-18 MED ORDER — DEXTROSE 10 % IV SOLN
INTRAVENOUS | Status: DC
  Administered 2018-04-18 – 2018-04-22 (×3): via INTRAVENOUS

## 2018-04-18 MED ORDER — ZOLPIDEM TARTRATE 5 MG PO TABS: 5.0000 mg | ORAL_TABLET | Freq: Every evening | ORAL | Status: DC | PRN

## 2018-04-18 MED ORDER — NITROGLYCERIN 0.4 MG SL SUBL: 0.4000 mg | SUBLINGUAL_TABLET | SUBLINGUAL | Status: DC | PRN

## 2018-04-18 MED ORDER — SODIUM CHLORIDE 0.9 % IV SOLN
10.0000 mL/h | Freq: Once | INTRAVENOUS | Status: AC
  Administered 2018-04-24: 10 mL/h via INTRAVENOUS

## 2018-04-18 MED ORDER — DARBEPOETIN ALFA 60 MCG/0.3ML IJ SOSY
PREFILLED_SYRINGE | INTRAMUSCULAR | Status: AC
  Filled 2018-04-18: qty 0.3

## 2018-04-18 MED ORDER — ONDANSETRON HCL 4 MG/2ML IJ SOLN: 4.0000 mg | Freq: Four times a day (QID) | INTRAMUSCULAR | Status: DC | PRN

## 2018-04-18 MED ORDER — LIDOCAINE HCL (PF) 1 % IJ SOLN: 5.0000 mL | INTRAMUSCULAR | Status: DC | PRN

## 2018-04-18 MED ORDER — PENTAFLUOROPROP-TETRAFLUOROETH EX AERO: 1.0000 "application " | INHALATION_SPRAY | CUTANEOUS | Status: DC | PRN

## 2018-04-18 MED ORDER — ISOSORBIDE MONONITRATE ER 30 MG PO TB24
30.0000 mg | ORAL_TABLET | Freq: Every day | ORAL | Status: DC
  Administered 2018-04-20: 30 mg via ORAL
  Filled 2018-04-18 (×2): qty 1

## 2018-04-18 MED ORDER — LEVOTHYROXINE SODIUM 50 MCG PO TABS
50.0000 ug | ORAL_TABLET | Freq: Every day | ORAL | Status: DC
  Administered 2018-04-19 – 2018-05-08 (×19): 50 ug via ORAL
  Filled 2018-04-18 (×19): qty 1
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
## 2018-04-18 NOTE — ED Triage Notes (Signed)
Pt arrives via EMS from home with reports of central chest pain that began yesterday. Denies any N/V. Has fistula in left arm but has not started HD yet, plans to start next week. RA O2 sats 86%
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
## 2018-04-18 NOTE — ED Notes (Signed)
ED Provider at bedside. 

## 2018-04-18 NOTE — ED Notes (Signed)
Lab results was given to Nurse Crystal.

## 2018-04-18 NOTE — ED Notes (Signed)
Patient transported to X-ray 

## 2018-04-19 ENCOUNTER — Inpatient Hospital Stay (HOSPITAL_COMMUNITY): Payer: Medicare Other

## 2018-04-19 DIAGNOSIS — I370 Nonrheumatic pulmonary valve stenosis: Secondary | ICD-10-CM

## 2018-04-19 DIAGNOSIS — I2101 ST elevation (STEMI) myocardial infarction involving left main coronary artery: Secondary | ICD-10-CM

## 2018-04-19 DIAGNOSIS — I361 Nonrheumatic tricuspid (valve) insufficiency: Secondary | ICD-10-CM

## 2018-04-19 DIAGNOSIS — I5021 Acute systolic (congestive) heart failure: Secondary | ICD-10-CM

## 2018-04-19 LAB — BASIC METABOLIC PANEL
Anion gap: 18 — ABNORMAL HIGH (ref 5–15)
BUN: 125 mg/dL — ABNORMAL HIGH (ref 8–23)
CHLORIDE: 99 mmol/L (ref 98–111)
CO2: 19 mmol/L — ABNORMAL LOW (ref 22–32)
Calcium: 7.8 mg/dL — ABNORMAL LOW (ref 8.9–10.3)
Creatinine, Ser: 7.72 mg/dL — ABNORMAL HIGH (ref 0.61–1.24)
GFR, EST AFRICAN AMERICAN: 7 mL/min — AB (ref >60)
GFR, EST NON AFRICAN AMERICAN: 6 mL/min — AB (ref >60)
Glucose, Bld: 118 mg/dL — ABNORMAL HIGH (ref 70–99)
POTASSIUM: 3.6 mmol/L (ref 3.5–5.1)
SODIUM: 136 mmol/L (ref 135–145)

## 2018-04-19 LAB — PROTIME-INR
INR: 4.1 — AB
PROTHROMBIN TIME: 39.5 s — AB (ref 11.4–15.2)

## 2018-04-19 LAB — TROPONIN I
Troponin I: 8.18 ng/mL (ref <0.03)
Troponin I: 7.96 ng/mL (ref <0.03)
Troponin I: 7.93 ng/mL (ref <0.03)

## 2018-04-19 LAB — CBC
HEMATOCRIT: 24.5 % — AB (ref 39.0–52.0)
HEMOGLOBIN: 7.6 g/dL — AB (ref 13.0–17.0)
MCH: 30 pg (ref 26.0–34.0)
MCHC: 31 g/dL (ref 30.0–36.0)
MCV: 96.8 fL (ref 80.0–100.0)
NRBC: 2.6 % — AB (ref 0.0–0.2)
Platelets: 251 10*3/uL (ref 150–400)
RBC: 2.53 MIL/uL — ABNORMAL LOW (ref 4.22–5.81)
RDW: 19.9 % — AB (ref 11.5–15.5)
WBC: 15.4 10*3/uL — AB (ref 4.0–10.5)

## 2018-04-19 LAB — ECHOCARDIOGRAM COMPLETE
Height: 66 in
WEIGHTICAEL: 2795.2 [oz_av]

## 2018-04-19 MED ORDER — SODIUM CHLORIDE 0.9% IV SOLUTION: Freq: Once | INTRAVENOUS | Status: DC

## 2018-04-19 MED ORDER — PRO-STAT SUGAR FREE PO LIQD
30.0000 mL | Freq: Every day | ORAL | Status: DC
  Administered 2018-04-21 – 2018-05-08 (×8): 30 mL via ORAL
  Filled 2018-04-19 (×7): qty 30

## 2018-04-19 MED ORDER — PHYTONADIONE 5 MG PO TABS
2.5000 mg | ORAL_TABLET | Freq: Once | ORAL | Status: AC
  Administered 2018-04-19: 2.5 mg via ORAL
  Filled 2018-04-19: qty 1

## 2018-04-19 MED ORDER — BOOST / RESOURCE BREEZE PO LIQD CUSTOM
1.0000 | Freq: Two times a day (BID) | ORAL | Status: DC
  Administered 2018-04-19 – 2018-04-30 (×6): 1 via ORAL
  Filled 2018-04-19 (×3): qty 1

## 2018-04-19 MED ORDER — METOPROLOL TARTRATE 12.5 MG HALF TABLET
12.5000 mg | ORAL_TABLET | Freq: Two times a day (BID) | ORAL | Status: DC
  Administered 2018-04-19 – 2018-04-23 (×6): 12.5 mg via ORAL
  Filled 2018-04-19 (×7): qty 1

## 2018-04-19 NOTE — Progress Notes (Signed)
  CRITICAL VALUE ALERT  Critical Value:  INR 4.10  Date & Time Notied:  0820  Provider Notified: Dr Denton Brick

## 2018-04-19 NOTE — Progress Notes (Signed)
Initial Nutrition Assessment  DOCUMENTATION CODES:   Not applicable  INTERVENTION:  - Will order Boost Breeze BID, each supplement provides 250 kcal and 9 grams of protein. - Will order 30 mL Prostat once/day, this supplement provides 100 kcal and 15 grams of protein. - Continue to encourage PO intakes.    NUTRITION DIAGNOSIS:   Increased nutrient needs related to acute illness, chronic illness as evidenced by estimated needs.  GOAL:   Patient will meet greater than or equal to 90% of their needs  MONITOR:   PO intake, Supplement acceptance, Weight trends, Labs, I & O's  REASON FOR ASSESSMENT:   Malnutrition Screening Tool  ASSESSMENT:   77 y.o. male with a hx of stage 5 CKD not on HD, abnormal stress test in 07/2016 (pt declined cath due to concerns regarding worsening renal function and need for HD), PVD with carotid artery occlusion s/p left CEA in 2011, atrial fibrillation, chronic anticoagulation with coumadin, chronic combined systolic and diastolic HF (EF 81-27% by echo in 2017), moderate MR, HTN, HLD and h/o stroke admitted 04/18/18 with chest pains and elevated troponin. Patient started on HD on 10/11.  BMI indicates overweight status, appropriate for age. No intakes documented since admission. Visualized lunch tray with ~25% completion. Patient states that ~1 week ago he began having poor appetite, nausea, inability to keep anything down. He reports that when he threw up he would only have clear liquid come up. He states that during that week he was not taking in any solid food. He reports that he is doing much better since admission and he is now able to eat a little bit at meals. He has not had any N/V since admission.   Patient does not have any teeth. He has dentures at home but forgot to bring them. They fit well. He wears them when he eats some things and does not eat them when he eats other things. He denies having any difficulties with chewing and no difficulties  with swallowing.  Patient reports UBW of 170-172 lb. Current weight is 175 lb; weight increase could be d/t fluid given patient started on HD yesterday. Patient prefers that any diet education be closer to d/c.    Medications reviewed; 50 mcg oral Synthroid/day, 2.5 mg vitamin K/day. Labs reviewed; BUN: 125 mg/dL, creatinine: 7.72 mg/dL, Ca: 7.8 mg/dL, GFR: 6 mL/min.  IVF; D10 @ 30 mL/hr (245 kcal).    NUTRITION - FOCUSED PHYSICAL EXAM:  Completed; no muscle and no fat wasting.   Diet Order:   Diet Order            Diet renal with fluid restriction Fluid restriction: 1200 mL Fluid; Room service appropriate? Yes; Fluid consistency: Thin  Diet effective now              EDUCATION NEEDS:   Not appropriate for education at this time  Skin:  Skin Assessment: Reviewed RN Assessment  Last BM:  10/10 (the day PTA)  Height:   Ht Readings from Last 1 Encounters:  04/18/18 5\' 6"  (1.676 m)    Weight:   Wt Readings from Last 1 Encounters:  04/19/18 79.2 kg    Ideal Body Weight:  64.54 kg  BMI:  Body mass index is 28.2 kg/m.  Estimated Nutritional Needs:   Kcal:  5170-0174  Protein:  95-105 grams  Fluid:  >/= 1.2 L/day     Jarome Matin, MS, RD, LDN, [facility] Inpatient Clinical Dietitian Pager # [phone] After hours/weekend pager # [phone]

## 2018-04-19 NOTE — Progress Notes (Signed)
  Echocardiogram 2D Echocardiogram has been performed.  Troy [name] 04/19/2018, 10:15 AM

## 2018-04-19 NOTE — Progress Notes (Signed)
DAILY PROGRESS NOTE   Patient Name: Troy [name] Date of Encounter: 04/19/2018  Chief Complaint   No issues overnight  Patient Profile   Troy [name] is a 77 y.o. 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 Wilson Singer, Emergency Medicine.  Subjective   No chest pain overnight. Dialyzed yesterday without UF. Received blood - now 1.9L positive. H/H not much improved secondary to fistula bleeding. Echo reviewed personally at the bedside - LVEF is lower at 25-30%. Weight is up 2 Kg. Leukocytosis is improving (24K -> 15k). INR remains elevated at 4.1 - warfarin on hold.   Objective   Vitals:   04/18/18 2028 04/19/18 0013 04/19/18 0517 04/19/18 0820  BP: 115/61 (!) 104/46 (!) 98/52 (!) 102/55  Pulse: 67   68  Resp: 18   (!) 23  Temp: (!) 97.5 F (36.4 C) (!) 97.4 F (36.3 C) 98.8 F (37.1 C) 97.6 F (36.4 C)  TempSrc: Oral Oral Oral Oral  SpO2: 95%   93%  Weight:   79.2 kg   Height:        Intake/Output Summary (Last 24 hours) at 04/19/2018 1000 Last data filed at 04/19/2018 0441 Gross per 24 hour  Intake 1932.21 ml  Output 0 ml  Net 1932.21 ml   Filed Weights   04/18/18 1720 04/18/18 1900 04/19/18 0517  Weight: 77.1 kg 77.2 kg 79.2 kg    Physical Exam   General appearance: alert and no distress Neck: JVD - a few cm above sternal notch, no carotid bruit and thyroid not enlarged, symmetric, no tenderness/mass/nodules Lungs: diminished breath sounds bibasilar Heart: regular rate and rhythm Abdomen: soft, non-tender; bowel sounds normal;  no masses,  no organomegaly Extremities: extremities normal, atraumatic, no cyanosis or edema Pulses: 2+ and symmetric Skin: Skin color, texture, turgor normal. No rashes or lesions Neurologic: Grossly normal Psych: pleasant  Inpatient Medications    Scheduled Meds: . sodium chloride   Intravenous Once  . amiodarone  200 mg Oral Daily  . atorvastatin  20 mg Oral QHS  . Chlorhexidine Gluconate Cloth  6 each Topical Q0600  . Darbepoetin Alfa  60 mcg Subcutaneous Q7 days  . isosorbide mononitrate  30 mg Oral Daily  . levothyroxine  50 mcg Oral QAC breakfast    Continuous Infusions: . sodium chloride    . dextrose 30 mL/hr at 04/19/18 0421    PRN Meds: nitroGLYCERIN, ondansetron **OR** ondansetron (ZOFRAN) IV, zolpidem   Labs   Results for orders placed or performed during the hospital encounter of 04/18/18 (from the past 48 hour(s))  Basic metabolic panel     Status: Abnormal   Collection Time: 04/18/18 12:00 PM  Result Value Ref Range   Sodium 138 135 - 145 mmol/L   Potassium 4.2 3.5 - 5.1 mmol/L   Chloride 100 98 - 111 mmol/L   CO2 14 (L) 22 - 32 mmol/L   Glucose, Bld 137 (H) 70 - 99 mg/dL   BUN 186 (H) 8 - 23 mg/dL    Comment: DELTA CHECK NOTED  Creatinine, Ser 9.51 (H) 0.61 - 1.24 mg/dL   Calcium 8.4 (L) 8.9 - 10.3 mg/dL   GFR calc non Af Amer 5 (L) >60 mL/min   GFR calc Af Amer 5 (L) >60 mL/min    Comment: (NOTE) The eGFR has been calculated using the CKD EPI equation. This calculation has not been validated in all clinical situations. eGFR's persistently <60 mL/min signify possible Chronic Kidney Disease.    Anion gap 24 (H) 5 - 15    Comment: Performed at [facility], [place] [address], [place], [place] [address]  CBC     Status: Abnormal   Collection Time: 04/18/18 12:00 PM  Result Value Ref Range   WBC 24.2 (H) 4.0 - 10.5 K/uL   RBC 2.49 (L) 4.22 - 5.81 MIL/uL   Hemoglobin 7.9 (L) 13.0 - 17.0 g/dL   HCT 25.6 (L) 39.0 - 52.0 %   MCV 102.8 (H) 80.0  - 100.0 fL   MCH 31.7 26.0 - 34.0 pg   MCHC 30.9 30.0 - 36.0 g/dL   RDW 18.8 (H) 11.5 - 15.5 %   Platelets 280 150 - 400 K/uL   nRBC 0.9 (H) 0.0 - 0.2 %    Comment: Performed at [facility] [address] [place], [place] [address]  Protime-INR     Status: Abnormal   Collection Time: 04/18/18 12:00 PM  Result Value Ref Range   Prothrombin Time 42.9 (H) 11.4 - 15.2 seconds    Comment: REPEATED TO VERIFY   INR 4.56 (HH)     Comment: REPEATED TO VERIFY CRITICAL RESULT CALLED TO, READ BACK BY AND VERIFIED WITH: Dwyane Dee 1246 04/18/18 D BRADLEY Performed at [facility], [place] [address], [place], [place] [address]   Brain natriuretic peptide     Status: Abnormal   Collection Time: 04/18/18 12:00 PM  Result Value Ref Range   B Natriuretic Peptide >4,500.0 (H) 0.0 - 100.0 pg/mL    Comment: Performed at [facility] [address], [place], [place] [address]  I-stat troponin, ED     Status: Abnormal   Collection Time: 04/18/18 12:10 PM  Result Value Ref Range   Troponin i, poc 8.79 (HH) 0.00 - 0.08 ng/mL   Comment NOTIFIED PHYSICIAN    Comment 3            Comment: Due to the release kinetics of cTnI, a negative result within the first hours of the onset of symptoms does not rule out myocardial infarction with certainty. If myocardial infarction is still suspected, repeat the test at appropriate intervals.   Type and screen [facility]     Status: None   Collection Time: 04/18/18  1:06 PM  Result Value Ref Range   ABO/RH(D) B POS    Antibody Screen NEG    Sample Expiration 04/21/2018    Unit Number [id number]    Blood Component Type RED CELLS,LR    Unit division 00    Status of Unit ISSUED,FINAL    Transfusion Status OK TO TRANSFUSE    Crossmatch Result      Compatible Performed at [facility], [place] [address], [place], [place] [address]   Prepare RBC     Status: None   Collection Time: 04/18/18  3:03 PM  Result Value Ref  Range   Order Confirmation      ORDER PROCESSED BY BLOOD BANK Performed at [facility], [place] [address],  [place], [place] [address]   TSH     Status: Abnormal   Collection Time: 04/18/18  7:00 PM  Result Value Ref Range   TSH 5.845 (H) 0.350 - 4.500 uIU/mL    Comment: Performed by a 3rd Generation assay with a functional sensitivity of <=0.01 uIU/mL. Performed at [facility], [place] [address], [place], [place] [address]   Hepatitis B surface antigen     Status: None   Collection Time: 04/18/18  7:00 PM  Result Value Ref Range   Hepatitis B Surface Ag Negative Negative    Comment: (NOTE) STAT RESULT CALLED/FAXED TO RIA WILSON AT 1115PM ON 04/18/18 Performed At: [facility] [place], [place] [id number] Rush Farmer MD FH:[id number]   MRSA PCR Screening     Status: None   Collection Time: 04/18/18 10:00 PM  Result Value Ref Range   MRSA by PCR NEGATIVE NEGATIVE    Comment:        The GeneXpert MRSA Assay (FDA approved for NASAL specimens only), is one component of a comprehensive MRSA colonization surveillance program. It is not intended to diagnose MRSA infection nor to guide or monitor treatment for MRSA infections. Performed at [facility], [place] [address] [place], [place] [address]   Troponin I     Status: Abnormal   Collection Time: 04/18/18 11:42 PM  Result Value Ref Range   Troponin I 8.18 (HH) <0.03 ng/mL    Comment: CRITICAL RESULT CALLED TO, READ BACK BY AND VERIFIED WITH: [facility] D,RN 04/19/18 0117 WAYK Performed at [facility] [address] [place], [place] [address]   Troponin I     Status: Abnormal   Collection Time: 04/19/18  6:25 AM  Result Value Ref Range   Troponin I 7.96 (HH) <0.03 ng/mL    Comment: CRITICAL VALUE NOTED.  VALUE IS CONSISTENT WITH PREVIOUSLY REPORTED AND CALLED VALUE. Performed at [facility], [place] [address], [place], [place] [address]   Basic metabolic panel     Status:  Abnormal   Collection Time: 04/19/18  6:25 AM  Result Value Ref Range   Sodium 136 135 - 145 mmol/L   Potassium 3.6 3.5 - 5.1 mmol/L   Chloride 99 98 - 111 mmol/L   CO2 19 (L) 22 - 32 mmol/L   Glucose, Bld 118 (H) 70 - 99 mg/dL   BUN 125 (H) 8 - 23 mg/dL   Creatinine, Ser 7.72 (H) 0.61 - 1.24 mg/dL   Calcium 7.8 (L) 8.9 - 10.3 mg/dL   GFR calc non Af Amer 6 (L) >60 mL/min   GFR calc Af Amer 7 (L) >60 mL/min    Comment: (NOTE) The eGFR has been calculated using the CKD EPI equation. This calculation has not been validated in all clinical situations. eGFR's persistently <60 mL/min signify possible Chronic Kidney Disease.    Anion gap 18 (H) 5 - 15    Comment: Performed at [facility], [place] [address] [place] [address], [place] [address]  CBC     Status: Abnormal   Collection Time: 04/19/18  6:25 AM  Result Value Ref Range   WBC 15.4 (H) 4.0 - 10.5 K/uL   RBC 2.53 (L) 4.22 - 5.81 MIL/uL   Hemoglobin 7.6 (L) 13.0 - 17.0 g/dL   HCT 24.5 (L) 39.0 - 52.0 %   MCV 96.8 80.0 - 100.0 fL   MCH 30.0 26.0 - 34.0 pg   MCHC 31.0 30.0 - 36.0 g/dL  RDW 19.9 (H) 11.5 - 15.5 %   Platelets 251 150 - 400 K/uL   nRBC 2.6 (H) 0.0 - 0.2 %    Comment: Performed at [facility], [place] [address] [place], [place] [address]  Protime-INR     Status: Abnormal   Collection Time: 04/19/18  6:25 AM  Result Value Ref Range   Prothrombin Time 39.5 (H) 11.4 - 15.2 seconds    Comment: REPEATED TO VERIFY   INR 4.10 (HH)     Comment: REPEATED TO VERIFY CRITICAL RESULT CALLED TO, READ BACK BY AND VERIFIED WITH: G.QILLIAMS RN @ [phone] 04/19/18 BY C.EDENS Performed at [facility], [place] [address] [place], [place] [address]     ECG   N/A  Telemetry   Sinus rhythm - Personally Reviewed  Radiology    Dg Chest 2 View  Result Date: 04/18/2018 CLINICAL DATA:  Chest pain and shortness of breath EXAM: CHEST - 2 VIEW COMPARISON:  Nov 09, 2017 FINDINGS: The mediastinal contour is normal. Heart size is  enlarged. There is pulmonary edema. There are small bilateral posterior pleural effusions. No focal pneumonia is identified. The visualized skeletal structures are stable. IMPRESSION: Congestive heart failure. Electronically Signed   By: Abelardo Diesel M.D.   On: 04/18/2018 12:44    Cardiac Studies   Echo pending  Assessment   1. Active Problems: 2.   ESRD (end stage renal disease) (HCC) 3.   Paroxysmal A-fib ([place]) 4.   Myocardial infarction ([place]) 5.   Plan   1. No chest pain overnight. Transfused 1 U, however, H/H unchanged. D/w Dr. 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 Will need cath next week once INR improves - slowly drifting down, but at 4.1. Given bleeding and dialysis access needs, would consider low dose vitamin K if ok with nephrology. Will also need some UF - he is volume up on exam. Cardiology will continue to follow.  Time Spent Directly with Patient:  I have spent a total of 35 minutes with the patient reviewing hospital notes, telemetry, EKGs, labs and examining the patient as well as establishing an assessment and plan that was discussed personally with the patient.  > 50% of time was spent in direct patient care.  Length of Stay:  LOS: 1 day   Pixie Casino, MD, [facility], [place] Director of the [facility] Lipid Disorders &  Cardiovascular Risk Reduction Clinic Diplomate of the American Board of Clinical Lipidology Attending Cardiologist  Direct Dial: [phone]  Fax: [phone]  Website:  [email or web address]  Nadean Corwin Jordi Lacko 04/19/2018, 10:00 AM

## 2018-04-19 NOTE — Progress Notes (Signed)
Admit: 04/18/2018 LOS: 1  68M presenting with uremia and new ESRD, NSTEMI  Subjective:  . HD #1 overnight, tolerated well, no ultrafiltration . AV fistula with bleeding overnight bandage clean/dry when I came by . Transfused 1 unit packed red cells, hemoglobin remains low at 7.6 . Patient without acute complaints . TTE being performed  10/11 0701 - 10/12 0700 In: 1932.2 [P.O.:360; I.V.:206.7; Blood:1365.5] Out: 0   Filed Weights   04/18/18 1720 04/18/18 1900 04/19/18 0517  Weight: 77.1 kg 77.2 kg 79.2 kg    Scheduled Meds: . sodium chloride   Intravenous Once  . amiodarone  200 mg Oral Daily  . atorvastatin  20 mg Oral QHS  . Chlorhexidine Gluconate Cloth  6 each Topical Q0600  . Darbepoetin Alfa  60 mcg Subcutaneous Q7 days  . isosorbide mononitrate  30 mg Oral Daily  . levothyroxine  50 mcg Oral QAC breakfast   Continuous Infusions: . sodium chloride    . dextrose 30 mL/hr at 04/19/18 0421   PRN Meds:.nitroGLYCERIN, ondansetron **OR** ondansetron (ZOFRAN) IV, zolpidem  Current Labs: reviewed    Physical Exam:  Blood pressure (!) 102/55, pulse 68, temperature 97.6 F (36.4 C), temperature source Oral, resp. rate (!) 23, height 5\' 6"  (1.676 m), weight 79.2 kg, SpO2 93 %. GEN: Chronically ill-appearing ENT: NCAT, poor dentition EYES: EOMI CV: Regular, nl rate, normal S1 and S2, no rub PULM: Coarse breath sounds bilaterally, mildly increased work of breathing ABD: Soft, nontender SKIN: No rashes or lesions EXT: No peripheral edema Left radiocephalic AV fistula with positive bruit and thrill, straight and long but on the small side  A 1. Uremia, now ESRD with L RC AVF 1. Started HD 10/11 via AV fistula 2. Clip process initiated 2. Bleeding from right forearm AV fistula post HD #1 3. NSTEMI, immediately for medical management, cardiac catheterization deferred; 4. Increased anion gap metabolic acidosis secondary to #1 5. 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 Secondary hyperparathyroidism on calcitriol 7. PAD with history of CEA 8. AFib on warfarin; INR 4.5  P . HD again today: slowing uptitrating Rx: 1L UF, 3K, Qb 250, 2.5h . Transfuse 2 units packed red cells with dialysis . Medication Issues; o Preferred narcotic agents for pain control are hydromorphone, fentanyl, and methadone. Morphine should not be used.  o Baclofen should be avoided o Avoid oral sodium phosphate and magnesium citrate based laxatives / bowel preps    Pearson Grippe MD 04/19/2018, 10:15 AM  Recent Labs  Lab 04/18/18 1200 04/19/18 0625  NA 138 136  K 4.2 3.6  CL 100 99  CO2 14* 19*  GLUCOSE 137* 118*  BUN 186* 125*  CREATININE 9.51* 7.72*  CALCIUM 8.4* 7.8*   Recent Labs  Lab 04/18/18 1200 04/19/18 0625  WBC 24.2* 15.4*  HGB 7.9* 7.6*  HCT 25.6* 24.5*  MCV 102.8* 96.8  PLT 280 251

## 2018-04-19 NOTE — Progress Notes (Signed)
Patient Demographics:    Turon Kilmer, is a 77 y.o. male, DOB - 02-03-1941, QHU:[id number]  Admit date - 04/18/2018   Admitting Physician Merton Border, MD  Outpatient Primary MD for the patient is Gaynelle Arabian, MD  LOS - 1   Chief Complaint  Patient presents with  . 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, abnormal stress test in 07/2016 (pt declined cath due to concerns regarding worsening renal function and need for HD), PVD with carotid artery occlusion s/p left CEA in 2011, atrial fibrillation, chronic anticoagulation with coumadin, chronic combined systolic and diastolic HF (EF 46-56% by echo in 2017), moderate MR, HTN, HLD and h/o strokeadmitted 04/18/18 with chest pains and elevated troponin  Plan:- 1)Chest Pain/NSTEMI--- positive Nuclear stress test in January 2018, EF is low as noted in #2 below, medical management for now, 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 INR  is currently supratherapeutic, continue Lipitor 20 mg nightly, continue isosorbide 30 mg daily, add metoprolol 12.5 mg twice daily  2)HFrEF--- Echo from 04/19/2018 with EF in the 25 to 30% range which is lower than previous history of chronic combined diastolic and systolic dysfunction CHF  3)PAFib--- INR supratherapeutic at 4, continue amiodarone added metoprolol 12.5 mg bid  4)PAD/PVD-- s/p Carotid endarterectomy in 2011, continue Lipitor, INR is supratherapeutic  5)Chronic Anemia of CKD--- suspect  superimposed acute blood loss from left arm AV fistula site in the setting of supratherapeutic INR, patient was transfused 1 unit on 04/18/2018 nephrology plans to transfuse another 2 units on 04/19/2018, consider low-dose vitamin K, EPO agent per nephrology team  6)ESRD--patient with uremia, hemodialysis per nephrology team to left [facility] AVF--clip process has been started, first HD session was 04/18/2018 , second HD session planned for 04/19/2018  7) hypothyroidism--- continue levothyroxine 50 mg daily  Disposition/Need for in-Hospital Stay- patient unable to be discharged at this time due to NSTEMI with significant worsening EF, patient needs left heart catheterization once INR drifts down, also initiated on hemodialysis due to uremia and ESRD, will need outpatient hemodialysis spot/chair prior to discharge  Code Status : Full  Disposition Plan  : TBD  Consults  : Cardiology/nephrology   DVT Prophylaxis  : Coumadin with supratherapeutic INR  Lab Results  Component Value Date   PLT 251 04/19/2018    Inpatient Medications  Scheduled Meds: . sodium chloride   Intravenous Once  . amiodarone  200 mg Oral Daily  . atorvastatin  20 mg Oral QHS  . Chlorhexidine Gluconate Cloth  6 each Topical Q0600  . Darbepoetin Alfa  60 mcg Subcutaneous Q7 days  . isosorbide mononitrate  30 mg Oral Daily  . levothyroxine  50 mcg Oral QAC breakfast   Continuous Infusions: . sodium chloride    .  dextrose 30 mL/hr at 04/19/18 0421   PRN Meds:.nitroGLYCERIN, ondansetron **OR** ondansetron (ZOFRAN) IV, zolpidem    Anti-infectives (From admission, onward)   None        Objective:   Vitals:   04/18/18 2028 04/19/18 0013 04/19/18 0517 04/19/18 0820  BP: 115/61 (!) 104/46 (!) 98/52 (!) 102/55  Pulse: 67   68  Resp: 18   (!) 23  Temp: (!) 97.5 F (36.4 C) (!) 97.4 F (36.3 C) 98.8 F (37.1 C) 97.6 F (36.4 C)  TempSrc: Oral Oral Oral Oral  SpO2: 95%   93%  Weight:   79.2 kg   Height:          Wt Readings from Last 3 Encounters:  04/19/18 79.2 kg  03/07/18 78.5 kg  02/21/18 78 kg     Intake/Output Summary (Last 24 hours) at 04/19/2018 1123 Last data filed at 04/19/2018 0441 Gross per 24 hour  Intake 1932.21 ml  Output 0 ml  Net 1932.21 ml     Physical Exam Patient is examined daily including today on 04/19/18  , exams remain the same as of yesterday except that has changed   Gen:- Awake Alert, no acute distress HEENT:- [place].AT, No sclera icterus Neck-Supple Neck,No JVD,.  Lungs-diminished in bases without wheezing or rales CV- S1, S2 normal, irregularly irregular, left subclavian area with MediPort in situ Abd-  +ve B.Sounds, Abd Soft, No tenderness,    Extremity/Skin:-Left forearm with AV fistula with positive thrill and bruit Psych-affect is appropriate, oriented x3 Neuro-no new focal deficits, no tremors   Data Review:   Micro Results Recent Results (from the past 240 hour(s))  MRSA PCR Screening     Status: None   Collection Time: 04/18/18 10:00 PM  Result Value Ref Range Status   MRSA by PCR NEGATIVE NEGATIVE Final    Comment:        The GeneXpert MRSA Assay (FDA approved for NASAL specimens only), is one component of a comprehensive MRSA colonization surveillance program. It is not intended to diagnose MRSA infection nor to guide or monitor treatment for MRSA infections. Performed at [facility], [place] [address], [place], [place] [address]     Radiology Reports Dg Chest 2 View  Result Date: 04/18/2018 CLINICAL DATA:  Chest pain and shortness of breath EXAM: CHEST - 2 VIEW COMPARISON:  Nov 09, 2017 FINDINGS: The mediastinal contour is normal. Heart size is enlarged. There is pulmonary edema. There are small bilateral posterior pleural effusions. No focal pneumonia is identified. The visualized skeletal structures are stable. IMPRESSION: Congestive heart failure. Electronically Signed   By: Abelardo Diesel M.D.   On: 04/18/2018 12:44      CBC Recent Labs  Lab 04/18/18 1200 04/19/18 0625  WBC 24.2* 15.4*  HGB 7.9* 7.6*  HCT 25.6* 24.5*  PLT 280 251  MCV 102.8* 96.8  MCH 31.7 30.0  MCHC 30.9 31.0  RDW 18.8* 19.9*    Chemistries  Recent Labs  Lab 04/18/18 1200 04/19/18 0625  NA 138 136  K 4.2 3.6  CL 100 99  CO2 14* 19*  GLUCOSE 137* 118*  BUN 186* 125*  CREATININE 9.51* 7.72*  CALCIUM 8.4* 7.8*   ------------------------------------------------------------------------------------------------------------------ No results for input(s): CHOL, HDL, LDLCALC, TRIG, CHOLHDL, LDLDIRECT in the last 72 hours.  Lab Results  Component Value Date   HGBA1C 5.0 06/05/2016   ------------------------------------------------------------------------------------------------------------------ Recent Labs    04/18/18 1900  TSH 5.845*   ------------------------------------------------------------------------------------------------------------------ No results for input(s): VITAMINB12, FOLATE,  FERRITIN, TIBC, IRON, RETICCTPCT in the last 72 hours.  Coagulation profile Recent Labs  Lab 04/15/18 0846 04/18/18 1200 04/19/18 0625  INR 3.8* 4.56* 4.10*    No results for input(s): DDIMER in the last 72 hours.  Cardiac Enzymes Recent Labs  Lab 04/18/18 2342 04/19/18 0625  TROPONINI 8.18* 7.96*   ------------------------------------------------------------------------------------------------------------------    Component Value Date/Time   BNP >4,500.0 (H) 04/18/2018 1200     Ladonte Verstraete M.D on 04/19/2018 at 11:23 AM  Pager---[phone] Go to [email or web address] - password [email or web address] for contact info  [facility] - Office  [phone]

## 2018-04-19 NOTE — Progress Notes (Signed)
Notified by Dr. Jonnie Finner to evaluate Mr. Troy [name] AVF, has been bleeding since trmt today.  Assessment revealed kerlix and 4x4 drsgs with moderate amt serosanguinous drainage.  Bulky bandage removed, previous cannulation site noted to have slight oozing present.  Light pressure held on site x 5 min.  Bleeding halted at this time.  Gauze left in place and bandaid applied.  Primary RN instructed on reinforcement of dressing if needed.  If further bleeding occurs, would recommend surgifoam over site until INR corrects.

## 2018-04-19 NOTE — Progress Notes (Signed)
Called to room by tech Nay, pts fistula still bleeding slowly from HD yesterday, pressure dressing applied with hemeostatis achieved, MD notified.

## 2018-04-20 DIAGNOSIS — D62 Acute posthemorrhagic anemia: Secondary | ICD-10-CM

## 2018-04-20 DIAGNOSIS — R791 Abnormal coagulation profile: Secondary | ICD-10-CM

## 2018-04-20 DIAGNOSIS — I5023 Acute on chronic systolic (congestive) heart failure: Secondary | ICD-10-CM

## 2018-04-20 LAB — BPAM RBC
Blood Product Expiration Date: 201910312359
Blood Product Expiration Date: 201911012359
Blood Product Expiration Date: 201911012359
ISSUE DATE / TIME: 201910111532
ISSUE DATE / TIME: 201910121416
ISSUE DATE / TIME: 201910121416
Unit Type and Rh: 7300
Unit Type and Rh: 7300
Unit Type and Rh: 7300

## 2018-04-20 LAB — TYPE AND SCREEN
ABO/RH(D): B POS
Antibody Screen: NEGATIVE
Unit division: 0
Unit division: 0
Unit division: 0

## 2018-04-20 LAB — BASIC METABOLIC PANEL
ANION GAP: 15 (ref 5–15)
BUN: 77 mg/dL — AB (ref 8–23)
CO2: 21 mmol/L — AB (ref 22–32)
Calcium: 7.8 mg/dL — ABNORMAL LOW (ref 8.9–10.3)
Chloride: 100 mmol/L (ref 98–111)
Creatinine, Ser: 6.32 mg/dL — ABNORMAL HIGH (ref 0.61–1.24)
GFR calc Af Amer: 9 mL/min — ABNORMAL LOW (ref >60)
GFR calc non Af Amer: 8 mL/min — ABNORMAL LOW (ref >60)
GLUCOSE: 115 mg/dL — AB (ref 70–99)
POTASSIUM: 3.6 mmol/L (ref 3.5–5.1)
Sodium: 136 mmol/L (ref 135–145)

## 2018-04-20 LAB — PROTIME-INR
INR: 2.21
INR: 1.96
Prothrombin Time: 24.4 seconds — ABNORMAL HIGH (ref 11.4–15.2)
Prothrombin Time: 22.2 seconds — ABNORMAL HIGH (ref 11.4–15.2)

## 2018-04-20 LAB — CBC
HEMATOCRIT: 30.9 % — AB (ref 39.0–52.0)
HEMOGLOBIN: 9.9 g/dL — AB (ref 13.0–17.0)
MCH: 31.2 pg (ref 26.0–34.0)
MCHC: 32 g/dL (ref 30.0–36.0)
MCV: 97.5 fL (ref 80.0–100.0)
NRBC: 2 % — AB (ref 0.0–0.2)
Platelets: 220 10*3/uL (ref 150–400)
RBC: 3.17 MIL/uL — ABNORMAL LOW (ref 4.22–5.81)
RDW: 20 % — ABNORMAL HIGH (ref 11.5–15.5)
WBC: 20.4 10*3/uL — ABNORMAL HIGH (ref 4.0–10.5)

## 2018-04-20 MED ORDER — VITAMIN K1 10 MG/ML IJ SOLN
5.0000 mg | INTRAVENOUS | Status: AC
  Administered 2018-04-20: 5 mg via INTRAVENOUS
  Filled 2018-04-20: qty 0.5

## 2018-04-20 MED ORDER — "THROMBI-PAD 3""X3"" EX PADS"
1.0000 | MEDICATED_PAD | Freq: Once | CUTANEOUS | Status: AC
  Administered 2018-04-20: 1 via TOPICAL
  Filled 2018-04-20: qty 1

## 2018-04-20 NOTE — Progress Notes (Signed)
Patient Demographics:    Troy [name], is a 77 y.o. male, DOB - 1941/02/23, IWP:[id number]  Admit date - 04/18/2018   Admitting Physician Merton Border, MD  Outpatient Primary MD for the patient is Gaynelle Arabian, MD  LOS - 2   Chief Complaint  Patient presents with  . Chest Pain        Subjective:    Troy [name] today has no fevers, no emesis, continues to have bleeding from left forearm AV fistula site requiring reapplication of pressure dressing from time to time, denies shortness of breath or chest pains  Assessment  & Plan :    Active Problems:   ESRD (end stage renal disease) (HCC)   Paroxysmal A-fib (HCC)   NSTEMI (non-ST elevated myocardial infarction) (HCC)   Acute on chronic systolic heart failure (HCC)   Myocardial infarction [facility])  Brief Summary:- 77 y.o.malewith a hx of stage 5 CKD not on HD, abnormal stress test in 07/2016 (pt declined cath due to concerns regarding worsening renal function and need for HD), 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 INR was above 4 on admission not down to 1.9 after vitamin K , continue Lipitor 20 mg nightly, continue isosorbide 30 mg daily, add metoprolol 12.5 mg twice daily  2)HFrEF--- Echo from 04/19/2018 with EF in the 25 to 30% range which is lower than previous history of chronic combined diastolic and systolic dysfunction CHF, volume status has been addressed with hemodialysis  session  3)PAFib--- INR supratherapeutic now 1.9 from over 4 previously after vitamin K (patient received 2.5 mg of vitamin K on 04/19/2018 and another 2.5 mg on 04/20/2018), continue amiodarone , c/n metoprolol 12.5 mg bid  4)PAD/PVD-- s/p Carotid endarterectomy in 2011, continue Lipitor, INR is down to 1.9 after vitamin K see #3 above  5)Chronic Anemia of CKD--- suspect superimposed acute blood loss from left arm AV fistula site in the setting of supratherapeutic INR, patient was transfused 1 unit on 04/18/2018 , received another 2 units on 04/19/2018, hemoglobin is 9.9 now after a total of 3 units of packed cells,  EPO agent per nephrology team  6)ESRD--patient with uremia, hemodialysis per nephrology team via left RC AVF--clip process has been started, first HD session was 04/18/2018 , second HD session was 04/19/2018, confusion and uremia appears to be improving but not resolved  7) hypothyroidism--- continue levothyroxine 50 mg daily  Disposition/Need for in-Hospital Stay- patient unable to be discharged at this time due to NSTEMI with significant worsening EF, patient needs left heart catheterization once INR drifts down, also initiated on hemodialysis due to uremia and ESRD, will need outpatient hemodialysis spot/chair prior to discharge  Code Status : Full  Disposition Plan  : TBD  Consults  : Cardiology/nephrology   DVT Prophylaxis  : Coumadin  (on hold as INR was Supratherapeutic  and patient was bleeding)  Lab Results  Component Value Date   PLT 220 04/20/2018    Inpatient Medications  Scheduled Meds: . sodium chloride   Intravenous Once  . sodium chloride   Intravenous Once  . amiodarone  200 mg Oral Daily  . atorvastatin  20 mg Oral QHS  . Chlorhexidine Gluconate Cloth  6 each Topical Q0600  . Darbepoetin Alfa  60 mcg Subcutaneous Q7 days  . feeding supplement  1 Container Oral BID BM  . feeding supplement (PRO-STAT SUGAR FREE 64)  30 mL Oral Daily  . isosorbide  mononitrate  30 mg Oral Daily  . levothyroxine  50 mcg Oral QAC breakfast  . metoprolol tartrate  12.5 mg Oral BID   Continuous Infusions: . sodium chloride    . dextrose 30 mL/hr at 04/20/18 0500   PRN Meds:.nitroGLYCERIN, ondansetron **OR** ondansetron (ZOFRAN) IV, zolpidem    Anti-infectives (From admission, onward)   None        Objective:   Vitals:   04/19/18 2356 04/20/18 0000 04/20/18 0418 04/20/18 0715  BP: (!) 108/55 (!) 108/55  (!) 104/52  Pulse:  61  (!) 55  Resp:  17  (!) 22  Temp: 98 F (36.7 C)  98.2 F (36.8 C)   TempSrc: Axillary  Oral   SpO2:  94%  93%  Weight:   76.8 kg   Height:        Wt Readings from Last 3 Encounters:  04/20/18 76.8 kg  03/07/18 78.5 kg  02/21/18 78 kg     Intake/Output Summary (Last 24 hours) at 04/20/2018 1535 Last data filed at 04/20/2018 0800 Gross per 24 hour  Intake 854.93 ml  Output 383 ml  Net 471.93 ml    Physical Exam Patient is examined daily including today on 04/20/18, exams remain the same as of yesterday except that has changed  Gen:- Awake Alert, no acute distress HEENT:- [place].AT, No sclera icterus Neck-Supple Neck,No JVD,.  Lungs-diminished in bases without wheezing or rales CV- S1, S2 normal, irregularly irregular, left subclavian area with MediPort in situ Abd-  +ve B.Sounds, Abd Soft, No tenderness,    Extremity/Skin:-Left forearm with AV fistula with positive thrill and bruit, pressure dressing applied due to bleeding Psych-affect is appropriate, oriented x3 Neuro-no new focal deficits, no tremors   Data Review:   Micro Results Recent Results (from the past 240 hour(s))  MRSA PCR Screening     Status: None   Collection Time: 04/18/18 10:00 PM  Result Value Ref Range Status   MRSA by PCR NEGATIVE NEGATIVE Final    Comment:        The GeneXpert MRSA Assay (FDA approved for NASAL specimens only), is one component of a comprehensive MRSA colonization surveillance program. It is  not intended to diagnose MRSA infection nor to guide or monitor treatment for MRSA infections. Performed at [facility], [place] [address], [place], [place] [address]     Radiology Reports Dg Chest 2 View  Result Date: 04/18/2018 CLINICAL DATA:  Chest pain and shortness of breath EXAM: CHEST - 2 VIEW COMPARISON:  Nov 09, 2017 FINDINGS: The mediastinal contour is normal. Heart size is enlarged. There is pulmonary edema. There are small bilateral posterior pleural effusions. No focal pneumonia is identified. The visualized skeletal structures are stable. IMPRESSION: Congestive heart failure. Electronically Signed   By: Abelardo Diesel M.D.   On: 04/18/2018 12:44     CBC Recent Labs  Lab 04/18/18 1200 04/19/18  9741 04/20/18 0915  WBC 24.2* 15.4* 20.4*  HGB 7.9* 7.6* 9.9*  HCT 25.6* 24.5* 30.9*  PLT 280 251 220  MCV 102.8* 96.8 97.5  MCH 31.7 30.0 31.2  MCHC 30.9 31.0 32.0  RDW 18.8* 19.9* 20.0*    Chemistries  Recent Labs  Lab 04/18/18 1200 04/19/18 0625 04/20/18 0915  NA 138 136 136  K 4.2 3.6 3.6  CL 100 99 100  CO2 14* 19* 21*  GLUCOSE 137* 118* 115*  BUN 186* 125* 77*  CREATININE 9.51* 7.72* 6.32*  CALCIUM 8.4* 7.8* 7.8*   ------------------------------------------------------------------------------------------------------------------ No results for input(s): CHOL, HDL, LDLCALC, TRIG, CHOLHDL, LDLDIRECT in the last 72 hours.  Lab Results  Component Value Date   HGBA1C 5.0 06/05/2016   ------------------------------------------------------------------------------------------------------------------ Recent Labs    04/18/18 1900  TSH 5.845*   ------------------------------------------------------------------------------------------------------------------ No results for input(s): VITAMINB12, FOLATE, FERRITIN, TIBC, IRON, RETICCTPCT in the last 72 hours.  Coagulation profile Recent Labs  Lab 04/15/18 0846 04/18/18 1200 04/19/18 0625 04/20/18 0915  04/20/18 1229  INR 3.8* 4.56* 4.10* 2.21 1.96    No results for input(s): DDIMER in the last 72 hours.  Cardiac Enzymes Recent Labs  Lab 04/18/18 2342 04/19/18 0625 04/19/18 1216  TROPONINI 8.18* 7.96* 7.93*   ------------------------------------------------------------------------------------------------------------------    Component Value Date/Time   BNP >4,500.0 (H) 04/18/2018 1200   Arneta Mahmood M.D on 04/20/2018 at 3:35 PM  Pager---[phone] Go to [email or web address] - password [email or web address] for contact info  [facility] - Office  [phone]

## 2018-04-20 NOTE — Progress Notes (Signed)
Admit: 04/18/2018 LOS: 2  45M presenting with uremia and new ESRD, NSTEMI  Subjective:  . HD #2 yesterday:  tolerated well, 0.4 L ultrafiltration . Continues to bleed from AV fistula, requiring dressing changes . Given vitamin K . INR 2.2 this morning . Nausea and vomiting have abated . Received 2 units packed red cells yesterday, hemoglobin 9.9 this morning  10/12 0701 - 10/13 0700 In: 1927.5 [P.O.:240; I.V.:299.9; Blood:1387.6] Out: 383   Filed Weights   04/19/18 1350 04/19/18 1700 04/20/18 0418  Weight: 76.1 kg 75.6 kg 76.8 kg    Scheduled Meds: . sodium chloride   Intravenous Once  . sodium chloride   Intravenous Once  . amiodarone  200 mg Oral Daily  . atorvastatin  20 mg Oral QHS  . Chlorhexidine Gluconate Cloth  6 each Topical Q0600  . Darbepoetin Alfa  60 mcg Subcutaneous Q7 days  . feeding supplement  1 Container Oral BID BM  . feeding supplement (PRO-STAT SUGAR FREE 64)  30 mL Oral Daily  . isosorbide mononitrate  30 mg Oral Daily  . levothyroxine  50 mcg Oral QAC breakfast  . metoprolol tartrate  12.5 mg Oral BID   Continuous Infusions: . sodium chloride    . dextrose 30 mL/hr at 04/20/18 0500   PRN Meds:.nitroGLYCERIN, ondansetron **OR** ondansetron (ZOFRAN) IV, zolpidem  Current Labs: reviewed    Physical Exam:  Blood pressure (!) 104/52, pulse (!) 55, temperature 98.2 F (36.8 C), temperature source Oral, resp. rate (!) 22, height 5\' 6"  (1.676 m), weight 76.8 kg, SpO2 93 %. GEN: Chronically ill-appearing ENT: NCAT, poor dentition EYES: EOMI CV: Regular, nl rate, normal S1 and S2, no rub PULM: Coarse breath sounds bilaterally, mildly increased work of breathing ABD: Soft, nontender SKIN: No rashes or lesions EXT: No peripheral edema Left radiocephalic AV fistula with positive bruit and thrill, straight and long but on the small side  A 1. Uremia, now ESRD with L RC AVF 1. Started HD 10/11 via AV fistula 2. Clip process initiated 2. Persistent  oozing/bleeding from right forearm AV fistula post HD while anticoagulated 3. NSTEMI acute exacerbation of systolic heart failure from ischemic cardiomyopathy, immediately for medical management, cardiac catheterization deferred; 4. Increased anion gap metabolic acidosis secondary to #1, resolving with dialysis 5. Anemia of renal failure on ESA; worsening, last iron saturation 20% with ferritin of 847; ESA Rx, required 2 units packed red cells on 10/12 6. Secondary hyperparathyroidism on calcitriol 7. PAD with history of CEA 8. AFib on warfarin; INR 2.2, warfarin held and vitamin K given  P . HD again tomorrow: slowing uptitrating Rx: 1L UF, 3K, Qb 250, 3h, no heparin . If bleeding from fistula persists with reversal of his INR, will need fistulogram to look for venous stenosis  Pearson Grippe MD 04/20/2018, 11:39 AM  Recent Labs  Lab 04/18/18 1200 04/19/18 0625 04/20/18 0915  NA 138 136 136  K 4.2 3.6 3.6  CL 100 99 100  CO2 14* 19* 21*  GLUCOSE 137* 118* 115*  BUN 186* 125* 77*  CREATININE 9.51* 7.72* 6.32*  CALCIUM 8.4* 7.8* 7.8*   Recent Labs  Lab 04/18/18 1200 04/19/18 0625 04/20/18 0915  WBC 24.2* 15.4* 20.4*  HGB 7.9* 7.6* 9.9*  HCT 25.6* 24.5* 30.9*  MCV 102.8* 96.8 97.5  PLT 280 251 220

## 2018-04-20 NOTE — Progress Notes (Addendum)
Patient ID: JAMOL GINYARD, male   DOB: 08-Feb-1941, 77 y.o.   MRN: [id number]   Thank you for consulting the Palliative Medicine Team at [facility] to meet your patient's and family's needs.   The reason that you asked [place] to see your patient is to establish goals of care  We have scheduled your patient for a meeting: Tomorrow October 14 at 12:00 noon, this was the earliest that family could meet  The Surrogate decision make is: Daughter named Ferd Hibbs  Other family members that need to be present: Daughter tells me she will invite her brother Delfino Lovett to the meeting   Wadie Lessen NP  Palliative Medicine Team Team Phone # [phone] Pager [phone]  No charge

## 2018-04-20 NOTE — Progress Notes (Addendum)
DAILY PROGRESS NOTE   Patient Name: Troy [name] Date of Encounter: 04/20/2018  Chief Complaint   Concerned about bleeding  Patient Profile   KIA STAVROS is a 77 y.o. 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 Wilson Singer, Emergency Medicine.  Subjective   Additional 2U PRBC's issued-  Hemoglobin remains in the 7's. Vitamin K appears not to have been given. INR elevated at 4.1. Troponin stably elevated at 7.93. Echo personally reviewed yesterday shows worsening systolic dysfunction with LVEF 25-30%.  Objective   Vitals:   04/19/18 2356 04/20/18 0000 04/20/18 0418 04/20/18 0715  BP: (!) 108/55 (!) 108/55  (!) 104/52  Pulse:  61  (!) 55  Resp:  17  (!) 22  Temp: 98 F (36.7 C)  98.2 F (36.8 C)   TempSrc: Axillary  Oral   SpO2:  94%  93%  Weight:   76.8 kg   Height:        Intake/Output Summary (Last 24 hours) at 04/20/2018 0918 Last data filed at 04/20/2018 0500 Gross per 24 hour  Intake 1807.51 ml  Output 383 ml  Net 1424.51 ml   Filed Weights   04/19/18 1350 04/19/18 1700 04/20/18 0418  Weight: 76.1 kg 75.6 kg 76.8 kg    Physical Exam   General appearance: alert and no distress Neck: JVD - a few cm above sternal notch, no carotid bruit and thyroid not enlarged, symmetric, no tenderness/mass/nodules Lungs: diminished breath sounds bibasilar Heart: regular rate and rhythm Abdomen: soft, non-tender; bowel sounds normal; no masses,  no organomegaly Extremities: extremities normal, atraumatic, no cyanosis or edema and persistent  bleeding from left forearm fistula Pulses: 2+ and symmetric Skin: Skin color, texture, turgor normal. No rashes or lesions Neurologic: Grossly normal Psych: pleasant  Inpatient Medications    Scheduled Meds: . sodium chloride   Intravenous Once  . sodium chloride   Intravenous Once  . amiodarone  200 mg Oral Daily  . atorvastatin  20 mg Oral QHS  . Chlorhexidine Gluconate Cloth  6 each Topical Q0600  . Darbepoetin Alfa  60 mcg Subcutaneous Q7 days  . feeding supplement  1 Container Oral BID BM  . feeding supplement (PRO-STAT SUGAR FREE 64)  30 mL Oral Daily  . isosorbide mononitrate  30 mg Oral Daily  . levothyroxine  50 mcg Oral QAC breakfast  . metoprolol tartrate  12.5 mg Oral BID    Continuous Infusions: . sodium chloride    . dextrose 30 mL/hr at 04/20/18 0500    PRN Meds: nitroGLYCERIN, ondansetron **OR** ondansetron (ZOFRAN) IV, zolpidem   Labs   Results for orders placed or performed during the hospital encounter of 04/18/18 (from the past 48 hour(s))  Basic metabolic panel     Status: Abnormal   Collection Time: 04/18/18 12:00 PM  Result Value Ref Range   Sodium 138 135 - 145 mmol/L   Potassium 4.2 3.5 - 5.1 mmol/L   Chloride 100 98 - 111 mmol/L   CO2 14 (L) 22 - 32 mmol/L   Glucose, Bld  137 (H) 70 - 99 mg/dL   BUN 186 (H) 8 - 23 mg/dL    Comment: DELTA CHECK NOTED   Creatinine, Ser 9.51 (H) 0.61 - 1.24 mg/dL   Calcium 8.4 (L) 8.9 - 10.3 mg/dL   GFR calc non Af Amer 5 (L) >60 mL/min   GFR calc Af Amer 5 (L) >60 mL/min    Comment: (NOTE) The eGFR has been calculated using the CKD EPI equation. This calculation has not been validated in all clinical situations. eGFR's persistently <60 mL/min signify possible Chronic Kidney Disease.    Anion gap 24 (H) 5 - 15    Comment: Performed at [facility], [place] [address], [place], [place] [address]  CBC     Status: Abnormal   Collection Time: 04/18/18 12:00 PM  Result Value Ref Range   WBC 24.2 (H) 4.0 -  10.5 K/uL   RBC 2.49 (L) 4.22 - 5.81 MIL/uL   Hemoglobin 7.9 (L) 13.0 - 17.0 g/dL   HCT 25.6 (L) 39.0 - 52.0 %   MCV 102.8 (H) 80.0 - 100.0 fL   MCH 31.7 26.0 - 34.0 pg   MCHC 30.9 30.0 - 36.0 g/dL   RDW 18.8 (H) 11.5 - 15.5 %   Platelets 280 150 - 400 K/uL   nRBC 0.9 (H) 0.0 - 0.2 %    Comment: Performed at [facility] [address] [place], [place] [address]  Protime-INR     Status: Abnormal   Collection Time: 04/18/18 12:00 PM  Result Value Ref Range   Prothrombin Time 42.9 (H) 11.4 - 15.2 seconds    Comment: REPEATED TO VERIFY   INR 4.56 (HH)     Comment: REPEATED TO VERIFY CRITICAL RESULT CALLED TO, READ BACK BY AND VERIFIED WITH: Dwyane Dee 1246 04/18/18 D BRADLEY Performed at [facility], [place] [address], [place], [place] [address]   Brain natriuretic peptide     Status: Abnormal   Collection Time: 04/18/18 12:00 PM  Result Value Ref Range   B Natriuretic Peptide >4,500.0 (H) 0.0 - 100.0 pg/mL    Comment: Performed at [facility] [address] [place], [place] [address]  I-stat troponin, ED     Status: Abnormal   Collection Time: 04/18/18 12:10 PM  Result Value Ref Range   Troponin i, poc 8.79 (HH) 0.00 - 0.08 ng/mL   Comment NOTIFIED PHYSICIAN    Comment 3            Comment: Due to the release kinetics of cTnI, a negative result within the first hours of the onset of symptoms does not rule out myocardial infarction with certainty. If myocardial infarction is still suspected, repeat the test at appropriate intervals.   Type and screen [place]     Status: None (Preliminary result)   Collection Time: 04/18/18  1:06 PM  Result Value Ref Range   ABO/RH(D) B POS    Antibody Screen NEG    Sample Expiration 04/21/2018    Unit Number [id number]    Blood Component Type RED CELLS,LR    Unit division 00    Status of Unit ISSUED,FINAL    Transfusion Status OK TO TRANSFUSE    Crossmatch Result Compatible    Unit Number  [id number]    Blood Component Type RED CELLS,LR    Unit division 00    Status of Unit ISSUED    Transfusion Status OK TO TRANSFUSE    Crossmatch  Result      Compatible Performed at [facility], [place] [address] [place], [place] [address]    Unit Number [id number]    Blood Component Type RED CELLS,LR    Unit division 00    Status of Unit ISSUED    Transfusion Status OK TO TRANSFUSE    Crossmatch Result Compatible   Prepare RBC     Status: None   Collection Time: 04/18/18  3:03 PM  Result Value Ref Range   Order Confirmation      ORDER PROCESSED BY BLOOD BANK Performed at [facility], [place] [address], [place], [place] [address]   TSH     Status: Abnormal   Collection Time: 04/18/18  7:00 PM  Result Value Ref Range   TSH 5.845 (H) 0.350 - 4.500 uIU/mL    Comment: Performed by a 3rd Generation assay with a functional sensitivity of <=0.01 uIU/mL. Performed at [facility], [place] [address], [place], [place] [address]   Hepatitis B surface antigen     Status: None   Collection Time: 04/18/18  7:00 PM  Result Value Ref Range   Hepatitis B Surface Ag Negative Negative    Comment: (NOTE) STAT RESULT CALLED/FAXED TO RIA WILSON AT 1115PM ON 04/18/18 Performed At: [facility] [place], [place] [id number] Rush Farmer MD ST:[id number]   MRSA PCR Screening     Status: None   Collection Time: 04/18/18 10:00 PM  Result Value Ref Range   MRSA by PCR NEGATIVE NEGATIVE    Comment:        The GeneXpert MRSA Assay (FDA approved for NASAL specimens only), is one component of a comprehensive MRSA colonization surveillance program. It is not intended to diagnose MRSA infection nor to guide or monitor treatment for MRSA infections. Performed at [facility], [place] [address], [place], [place] [address]   Troponin I     Status: Abnormal   Collection Time: 04/18/18 11:42 PM  Result Value Ref Range   Troponin I 8.18 (HH) <0.03 ng/mL      Comment: CRITICAL RESULT CALLED TO, READ BACK BY AND VERIFIED WITH: [facility] D,RN 04/19/18 0117 WAYK Performed at [facility] [address] [place], [place] [address]   Troponin I     Status: Abnormal   Collection Time: 04/19/18  6:25 AM  Result Value Ref Range   Troponin I 7.96 (HH) <0.03 ng/mL    Comment: CRITICAL VALUE NOTED.  VALUE IS CONSISTENT WITH PREVIOUSLY REPORTED AND CALLED VALUE. Performed at [facility], [place] [address] [place],  [address]   Basic metabolic panel     Status: Abnormal   Collection Time: 04/19/18  6:25 AM  Result Value Ref Range   Sodium 136 135 - 145 mmol/L   Potassium 3.6 3.5 - 5.1 mmol/L   Chloride 99 98 - 111 mmol/L   CO2 19 (L) 22 - 32 mmol/L   Glucose, Bld 118 (H) 70 - 99 mg/dL   BUN 125 (H) 8 - 23 mg/dL   Creatinine, Ser 7.72 (H) 0.61 - 1.24 mg/dL   Calcium 7.8 (L) 8.9 - 10.3 mg/dL   GFR calc non Af Amer 6 (L) >60 mL/min   GFR calc Af Amer 7 (L) >60 mL/min    Comment: (NOTE) The eGFR has been calculated using the CKD EPI equation. This calculation has not been validated in all clinical situations. eGFR's persistently <60 mL/min signify possible Chronic Kidney  Disease.    Anion gap 18 (H) 5 - 15    Comment: Performed at [facility], [place] [address], [place], [place] [address]  CBC     Status: Abnormal   Collection Time: 04/19/18  6:25 AM  Result Value Ref Range   WBC 15.4 (H) 4.0 - 10.5 K/uL   RBC 2.53 (L) 4.22 - 5.81 MIL/uL   Hemoglobin 7.6 (L) 13.0 - 17.0 g/dL   HCT 24.5 (L) 39.0 - 52.0 %   MCV 96.8 80.0 - 100.0 fL   MCH 30.0 26.0 - 34.0 pg   MCHC 31.0 30.0 - 36.0 g/dL   RDW 19.9 (H) 11.5 - 15.5 %   Platelets 251 150 - 400 K/uL   nRBC 2.6 (H) 0.0 - 0.2 %    Comment: Performed at [facility] [address] [place], [place] [address]  Protime-INR     Status: Abnormal   Collection Time: 04/19/18  6:25 AM  Result Value Ref Range   Prothrombin Time 39.5 (H) 11.4 - 15.2 seconds    Comment:  REPEATED TO VERIFY   INR 4.10 (HH)     Comment: REPEATED TO VERIFY CRITICAL RESULT CALLED TO, READ BACK BY AND VERIFIED WITH: G.QILLIAMS RN @ [phone] 04/19/18 BY C.EDENS Performed at [facility], [place] [address] [place], [place] [address]   Troponin I     Status: Abnormal   Collection Time: 04/19/18 12:16 PM  Result Value Ref Range   Troponin I 7.93 (HH) <0.03 ng/mL    Comment: CRITICAL VALUE NOTED.  VALUE IS CONSISTENT WITH PREVIOUSLY REPORTED AND CALLED VALUE. Performed at [facility], [place] [address] [place],  [address]     ECG   N/A  Telemetry   Sinus rhythm - Personally Reviewed  Radiology    Dg Chest 2 View  Result Date: 04/18/2018 CLINICAL DATA:  Chest pain and shortness of breath EXAM: CHEST - 2 VIEW COMPARISON:  Nov 09, 2017 FINDINGS: The mediastinal contour is normal. Heart size is enlarged. There is pulmonary edema. There are small bilateral posterior pleural effusions. No focal pneumonia is identified. The visualized skeletal structures are stable. IMPRESSION: Congestive heart failure. Electronically Signed   By: Abelardo Diesel M.D.   On: 04/18/2018 12:44    Cardiac Studies   LV EF: 25% -   30%  ------------------------------------------------------------------- Indications:      MI - acute 410.91.  ------------------------------------------------------------------- History:   Risk factors:  End stage renal disease. Paroxysmal A-fib.  ------------------------------------------------------------------- Study Conclusions  - Left ventricle: The cavity size was normal. Wall thickness was   increased in a pattern of mild LVH. Systolic function was   severely reduced. The estimated ejection fraction was in the   range of 25% to 30%. Diffuse hypokinesis with regional variation.   Doppler parameters are consistent with restrictive diastolic   physiology. The E&'e&' ratio is markedly elevated >20, suggesting   increased LV filling  pressure. - Mitral valve: MAC with leaflet calcification. Moderate   regurgitation. - Left atrium: Severely dilated. - Right ventricle: The cavity size was mildly dilated. Moderately   reduced systolic function. - Right atrium: The atrium was mildly dilated. - Tricuspid valve: There was mild regurgitation. - Pulmonary arteries: PA peak pressure: 41 mm Hg (S). - Inferior vena cava: The vessel was dilated. The respirophasic   diameter changes were blunted (< 50%), consistent with elevated   central venous pressure.  Impressions:  - Compared to a  prior study in 2017, the LVEF is lower at 25-30%   with grade 3 DD and high LV fililng pressure, moderate RV   dysfunction and mild to moderate pulmonary hypertension with RVSP   of 41 mmHg.  Assessment   Active Problems:   ESRD (end stage renal disease) (HCC)   Paroxysmal A-fib (HCC)   NSTEMI (non-ST elevated myocardial infarction) (HCC)   Acute on chronic systolic heart failure (HCC)   Myocardial infarction ([place])   Plan   1. No complaints - continues to talk about how he can avoid the "big needle" (dialysis access) - appreciate palliative care evaluation. New cardioymopathy, likely ischemic. With ongoing bleeding, he is not a candidate for cath +/- PCI at this point. Does not appear that he received Vit K yesterday - INR still above 4. FFP not given. Will give IV vitamin K now with active bleeding. Prognosis is guarded at this point.  Time Spent Directly with Patient:b    I have spent a total of 25 minutes with the patient reviewing hospital notes, telemetry, EKGs, labs and examining the patient as well as establishing an assessment and plan that was discussed personally with the patient.  > 50% of time was spent in direct patient care.  Length of Stay:  LOS: 2 days   Pixie Casino, MD, [facility], [place] Director of the Advanced Lipid Disorders &  Cardiovascular Risk Reduction Clinic Diplomate  of the American Board of Clinical Lipidology Attending Cardiologist  Direct Dial: [phone]  Fax: [phone]  Website:  [email or web address] 04/20/2018, 9:18 AM

## 2018-04-21 ENCOUNTER — Encounter (HOSPITAL_COMMUNITY)
Admission: EM | Disposition: A | Discharge status: Skilled Nursing Facility | Payer: Self-pay | Source: Home / Self Care | Attending: Internal Medicine

## 2018-04-21 DIAGNOSIS — Z7189 Other specified counseling: Secondary | ICD-10-CM

## 2018-04-21 DIAGNOSIS — D72829 Elevated white blood cell count, unspecified: Secondary | ICD-10-CM

## 2018-04-21 DIAGNOSIS — Z515 Encounter for palliative care: Secondary | ICD-10-CM

## 2018-04-21 LAB — CBC
HEMATOCRIT: 31.4 % — AB (ref 39.0–52.0)
Hemoglobin: 9.8 g/dL — ABNORMAL LOW (ref 13.0–17.0)
MCH: 30.2 pg (ref 26.0–34.0)
MCHC: 31.2 g/dL (ref 30.0–36.0)
MCV: 96.6 fL (ref 80.0–100.0)
Platelets: 253 10*3/uL (ref 150–400)
RBC: 3.25 MIL/uL — ABNORMAL LOW (ref 4.22–5.81)
RDW: 19.7 % — ABNORMAL HIGH (ref 11.5–15.5)
WBC: 20.6 10*3/uL — ABNORMAL HIGH (ref 4.0–10.5)
nRBC: 1.4 % — ABNORMAL HIGH (ref 0.0–0.2)

## 2018-04-21 LAB — BASIC METABOLIC PANEL
Anion gap: 16 — ABNORMAL HIGH (ref 5–15)
BUN: 94 mg/dL — AB (ref 8–23)
CO2: 21 mmol/L — AB (ref 22–32)
Calcium: 7.6 mg/dL — ABNORMAL LOW (ref 8.9–10.3)
Chloride: 97 mmol/L — ABNORMAL LOW (ref 98–111)
Creatinine, Ser: 7.69 mg/dL — ABNORMAL HIGH (ref 0.61–1.24)
GFR calc Af Amer: 7 mL/min — ABNORMAL LOW (ref >60)
GFR calc non Af Amer: 6 mL/min — ABNORMAL LOW (ref >60)
Glucose, Bld: 116 mg/dL — ABNORMAL HIGH (ref 70–99)
POTASSIUM: 3.8 mmol/L (ref 3.5–5.1)
Sodium: 134 mmol/L — ABNORMAL LOW (ref 135–145)

## 2018-04-21 LAB — PROTIME-INR
INR: 1.43
Prothrombin Time: 17.3 seconds — ABNORMAL HIGH (ref 11.4–15.2)

## 2018-04-21 LAB — HEPATITIS B CORE ANTIBODY, TOTAL: HEP B C TOTAL AB: NEGATIVE

## 2018-04-21 LAB — HEPATITIS B SURFACE ANTIBODY, QUANTITATIVE: Hepatitis B-Post: <3.1 m[IU]/mL — ABNORMAL LOW (ref >9.9)

## 2018-04-21 SURGERY — LEFT HEART CATH AND CORONARY ANGIOGRAPHY
Anesthesia: LOCAL

## 2018-04-21 MED ORDER — SODIUM CHLORIDE 0.9 % IV SOLN
125.0000 mg | INTRAVENOUS | Status: AC
  Administered 2018-04-23 – 2018-04-30 (×3): 125 mg via INTRAVENOUS
  Filled 2018-04-21 (×8): qty 10

## 2018-04-21 MED ORDER — DARBEPOETIN ALFA 60 MCG/0.3ML IJ SOSY
200.0000 ug | PREFILLED_SYRINGE | INTRAMUSCULAR | Status: DC
  Filled 2018-04-21: qty 1.2

## 2018-04-21 MED ORDER — ASPIRIN EC 81 MG PO TBEC
81.0000 mg | DELAYED_RELEASE_TABLET | Freq: Every day | ORAL | Status: DC
  Administered 2018-04-22 – 2018-05-08 (×17): 81 mg via ORAL
  Filled 2018-04-21 (×17): qty 1

## 2018-04-21 MED ORDER — LIDOCAINE-PRILOCAINE 2.5-2.5 % EX CREA
TOPICAL_CREAM | CUTANEOUS | Status: DC
  Administered 2018-04-28: 18:00:00 via TOPICAL
  Filled 2018-04-21: qty 5

## 2018-04-21 NOTE — Progress Notes (Signed)
Patient Demographics:    Troy [name], is a 77 y.o. male, DOB - [birth year], XYI:[id number]  Admit date - 04/18/2018   Admitting Physician Merton Border, MD  Outpatient Primary MD for the patient is Gaynelle Arabian, MD  LOS - 3   Chief Complaint  Patient presents with  . 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, abnormal stress test in 07/2016 (pt declined cath due to concerns regarding worsening renal function and need for HD), PVD with carotid artery occlusion s/p left CEA in 2011, atrial fibrillation, chronic anticoagulation with coumadin, chronic combined systolic and diastolic HF (EF 27-07% by echo in 2017), moderate MR, HTN, HLD and h/o strokeadmitted 04/18/18 with chest pains and elevated troponin  Plan:- 1)Chest Pain/NSTEMI--- chest pain-free, positive Nuclear stress test in January 2018, EF is low as noted in #2 below, medical management for now, 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 INR was above 4 on admission not down to 1.4 after vitamin K x 2 doses , continue aspirin, Lipitor 20 mg nightly, continue isosorbide 30 mg daily, c/n  metoprolol 12.5 mg twice daily... Patient and daughter want left heart catheter possible angioplasty with stent placement if indicated,   2)HFrEF--- Echo from  04/19/2018 with EF in the 25 to 30% range which is lower than previous history of chronic combined diastolic and systolic dysfunction CHF, volume status has been addressed with hemodialysis session, continue isosorbide, unable to add hydralazine due to soft BP  3)PAFib--- INR supratherapeutic now 1.9 from over 4 previously after vitamin K (patient received 2.5 mg of vitamin K on 04/19/2018 and another 2.5 mg on 04/20/2018), continue amiodarone , c/n metoprolol 12.5 mg bid  4)PAD/PVD-- s/p Carotid endarterectomy in 2011, continue Lipitor, INR is down to 1.9 after vitamin K see #3 above  5)Chronic Anemia of CKD--- suspect superimposed acute blood loss from left arm AV fistula site in the setting of supratherapeutic INR, patient was transfused 1 unit on 04/18/2018 , received another 2 units on 04/19/2018, hemoglobin is 9.8 now after a total of 3 units of packed cells,  EPO agent per nephrology team  6)ESRD--patient with uremia, hemodialysis per nephrology team via left RC AVF--clip process has been started, first HD session was 04/18/2018 , second HD session was 04/19/2018, confusion and uremia appears to be improving with HD, more coherent  7) hypothyroidism--- stable, continue levothyroxine 50 mg daily  8) leukocytosis--white count above 20K, ????  Etiology, no fevers, no obvious acute infection at this time, get CBC with differential, get cultures  Disposition/Need for in-Hospital Stay- patient  unable to be discharged at this time due to NSTEMI with significant worsening EF, patient needs left heart catheterization once INR drifts down, also initiated on hemodialysis due to uremia and ESRD, will need outpatient hemodialysis spot/chair prior to discharge   Code Status : Full  Disposition Plan  : TBD---??? SNF, await PT eval  Consults  : Cardiology/nephrology   DVT Prophylaxis  : Coumadin  (on hold as INR was Supratherapeutic and patient was bleeding)  Lab Results  Component Value Date   PLT  253 04/21/2018    Inpatient Medications  Scheduled Meds: . sodium chloride   Intravenous Once  . sodium chloride   Intravenous Once  . amiodarone  200 mg Oral Daily  . aspirin EC  81 mg Oral Daily  . atorvastatin  20 mg Oral QHS  . Chlorhexidine Gluconate Cloth  6 each Topical Q0600  . Darbepoetin Alfa  60 mcg Subcutaneous Q7 days  . feeding supplement  1 Container Oral BID BM  . feeding supplement (PRO-STAT SUGAR FREE 64)  30 mL Oral Daily  . isosorbide mononitrate  30 mg Oral Daily  . levothyroxine  50 mcg Oral QAC breakfast  . lidocaine-prilocaine   Topical Q M,W,F  . metoprolol tartrate  12.5 mg Oral BID   Continuous Infusions: . sodium chloride    . dextrose 30 mL/hr at 04/21/18 0200   PRN Meds:.nitroGLYCERIN, ondansetron **OR** ondansetron (ZOFRAN) IV, zolpidem    Anti-infectives (From admission, onward)   None        Objective:   Vitals:   04/20/18 2119 04/20/18 2354 04/21/18 0445 04/21/18 0803  BP: (!) 100/51 (!) 93/46 (!) 106/58   Pulse: (!) 55 (!) 50 (!) 54 (!) 52  Resp:    19  Temp:  97.8 F (36.6 C) 97.6 F (36.4 C) 97.9 F (36.6 C)  TempSrc:  Axillary Oral Oral  SpO2:    97%  Weight:   78.7 kg   Height:        Wt Readings from Last 3 Encounters:  04/21/18 78.7 kg  03/07/18 78.5 kg  02/21/18 78 kg     Intake/Output Summary (Last 24 hours) at 04/21/2018 1122 Last data filed at 04/21/2018 0851 Gross per 24 hour  Intake 1080.25 ml  Output -  Net 1080.25 ml    Physical Exam Patient is examined daily including today on 04/21/18, exams remain the same as of yesterday except that has changed  Gen:- Awake Alert, no acute distress HEENT:- Troy [name], No sclera icterus Ears-Hard of hearing Neck-Supple Neck,No JVD,.  Lungs-diminished in bases without wheezing or rales CV- S1, S2 normal, history of A. fib actually regular at this time , left subclavian area with MediPort in situ Abd-  +ve B.Sounds, Abd Soft, No tenderness,    Extremity/Skin:-Left  forearm with AV fistula with positive thrill and bruit, pressure dressing applied due to bleeding Psych-affect is appropriate, oriented x3 Neuro-no new focal deficits, no tremors   Data Review:   Micro Results Recent Results (from the past 240 hour(s))  MRSA PCR Screening     Status: None   Collection Time: 04/18/18 10:00 PM  Result Value Ref Range Status   MRSA by PCR NEGATIVE NEGATIVE Final    Comment:        The GeneXpert MRSA Assay (FDA approved for NASAL specimens only), is one component of a comprehensive MRSA colonization surveillance program. It is not intended to diagnose MRSA infection nor to guide or monitor treatment for MRSA infections. Performed  at [facility], [place] [address] [place], [place] [address]     Radiology Reports Dg Chest 2 View  Result Date: 04/18/2018 CLINICAL DATA:  Chest pain and shortness of breath EXAM: CHEST - 2 VIEW COMPARISON:  Nov 09, 2017 FINDINGS: The mediastinal contour is normal. Heart size is enlarged. There is pulmonary edema. There are small bilateral posterior pleural effusions. No focal pneumonia is identified. The visualized skeletal structures are stable. IMPRESSION: Congestive heart failure. Electronically Signed   By: Abelardo Diesel M.D.   On: 04/18/2018 12:44     CBC Recent Labs  Lab 04/18/18 1200 04/19/18 0625 04/20/18 0915 04/21/18 0403  WBC 24.2* 15.4* 20.4* 20.6*  HGB 7.9* 7.6* 9.9* 9.8*  HCT 25.6* 24.5* 30.9* 31.4*  PLT 280 251 220 253  MCV 102.8* 96.8 97.5 96.6  MCH 31.7 30.0 31.2 30.2  MCHC 30.9 31.0 32.0 31.2  RDW 18.8* 19.9* 20.0* 19.7*    Chemistries  Recent Labs  Lab 04/18/18 1200 04/19/18 0625 04/20/18 0915 04/21/18 0403  NA 138 136 136 134*  K 4.2 3.6 3.6 3.8  CL 100 99 100 97*  CO2 14* 19* 21* 21*  GLUCOSE 137* 118* 115* 116*  BUN 186* 125* 77* 94*  CREATININE 9.51* 7.72* 6.32* 7.69*  CALCIUM 8.4* 7.8* 7.8* 7.6*    ------------------------------------------------------------------------------------------------------------------ No results for input(s): CHOL, HDL, LDLCALC, TRIG, CHOLHDL, LDLDIRECT in the last 72 hours.  Lab Results  Component Value Date   HGBA1C 5.0 06/05/2016   ------------------------------------------------------------------------------------------------------------------ Recent Labs    04/18/18 1900  TSH 5.845*   ------------------------------------------------------------------------------------------------------------------ No results for input(s): VITAMINB12, FOLATE, FERRITIN, TIBC, IRON, RETICCTPCT in the last 72 hours.  Coagulation profile Recent Labs  Lab 04/18/18 1200 04/19/18 0625 04/20/18 0915 04/20/18 1229 04/21/18 0403  INR 4.56* 4.10* 2.21 1.96 1.43    No results for input(s): DDIMER in the last 72 hours.  Cardiac Enzymes Recent Labs  Lab 04/18/18 2342 04/19/18 0625 04/19/18 1216  TROPONINI 8.18* 7.96* 7.93*   ------------------------------------------------------------------------------------------------------------------    Component Value Date/Time   BNP >4,500.0 (H) 04/18/2018 1200   Troy [name] M.D on 04/21/2018 at 11:22 AM  Pager---[phone] Go to [email or web address] - password [email or web address] for contact info  [facility] - Office  [phone]

## 2018-04-21 NOTE — Evaluation (Signed)
Physical Therapy Evaluation Patient Details Name: Troy [name] MRN: [id number] DOB: 02/21/1941 Today's Date: 04/21/2018   History of Present Illness  Patient is a 77 y/o male who presents with chest pain. 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 PMH includes stroke, PAF, HTN, CKD, CHF.  Clinical Impression  Patient presents with generalized weakness, deconditioning, fatigue, dizziness and impaired mobility s/p above. Pt mod I PTA using rollator vs SPC for ambulation. Lives with wife and has supportive family who assists with grocery shopping/IADLs. Tolerated standing and side stepping along side bed with Min A for balance. Limited mainly by dizziness, fatigue and soft BP as well as pt needing to go to dialysis. See vitals below. Supine BP 115/56 Sitting BP 112/46 Standing BP 97/57 Sp02 dropped to 87% on RA so donned supplemental 02 during mobility. HR remained in 50s bpm. Pt fatigues quickly. Would benefit from SNF to maximize independence and mobility prior to return home. Will follow acutely.    Follow Up Recommendations SNF;Supervision for mobility/OOB    Equipment Recommendations  None recommended by PT    Recommendations for Other Services       Precautions / Restrictions Precautions Precautions: Fall Precaution Comments: soft BP Restrictions Weight Bearing Restrictions: No      Mobility  Bed Mobility Overal bed mobility: Needs Assistance Bed Mobility: Supine to Sit;Sit to Supine     Supine to sit: Mod assist;HOB elevated Sit to supine: Min guard;HOB elevated   General bed mobility comments: Assist to elevate trunk to get to EOB. Able to bring LEs into bed to return to supine.  Transfers Overall transfer level: Needs assistance Equipment used: Rolling walker (2 wheeled) Transfers: Sit to/from Stand Sit to Stand: Min assist         General transfer comment: Assist to power to standing with cues for hand placement/technique,. +  dizziness.  Ambulation/Gait Ambulation/Gait assistance: Min assist Gait Distance (Feet): 6 Feet Assistive device: Rolling walker (2 wheeled) Gait Pattern/deviations: Step-to pattern;Trunk flexed Gait velocity: decreased   General Gait Details: Able to side step along side bed with min A for balance. + dizziness limiting mobility. Soft BP. leaving for HD. Sp02 dropped to 87% on RA.  Stairs            Wheelchair Mobility    Modified Rankin (Stroke Patients Only)       Balance Overall balance assessment: Needs assistance Sitting-balance support: Feet supported;Bilateral upper extremity supported Sitting balance-Leahy Scale: Fair     Standing balance support: During functional activity;Bilateral upper extremity supported Standing balance-Leahy Scale: Poor Standing balance comment: Reliant on RW for standing.                             Pertinent Vitals/Pain Pain Assessment: No/denies pain    Home Living Family/patient expects to be discharged to:: Private residence Living Arrangements: Spouse/significant other Available Help at Discharge: Family;Available 24 hours/day Type of Home: House Home Access: Ramped entrance     Home Layout: One level Home Equipment: Cane - single point;Walker - 4 wheels;Bedside commode;Wheelchair - manual      Prior Function Level of Independence: Independent with assistive device(s)         Comments: Does cooking. Daughter helps with grocery shopping. Uses [facility] for MD appts.     Hand Dominance        Extremity/Trunk Assessment   Upper Extremity Assessment Upper Extremity Assessment: Defer to OT evaluation  Lower Extremity Assessment Lower Extremity Assessment: Generalized weakness    Cervical / Trunk Assessment Cervical / Trunk Assessment: Kyphotic  Communication   Communication: HOH  Cognition Arousal/Alertness: Awake/alert Behavior During Therapy: WFL for tasks assessed/performed Overall Cognitive  Status: Within Functional Limits for tasks assessed                                 General Comments: for basic mobility tasks- HOH.      General Comments General comments (skin integrity, edema, etc.): Family stepped out of room for PT eval.    Exercises     Assessment/Plan    PT Assessment Patient needs continued PT services  PT Problem List Decreased strength;Decreased mobility;Decreased activity tolerance;Cardiopulmonary status limiting activity;Decreased balance       PT Treatment Interventions Functional mobility training;Balance training;Patient/family education;Gait training;Therapeutic activities;Stair training;Therapeutic exercise    PT Goals (Current goals can be found in the Care Plan section)  Acute Rehab PT Goals Patient Stated Goal: to get better PT Goal Formulation: With patient Time For Goal Achievement: 05/05/18 Potential to Achieve Goals: Good    Frequency Min 2X/week   Barriers to discharge        Co-evaluation               AM-PAC PT "6 Clicks" Daily Activity  Outcome Measure Difficulty turning over in bed (including adjusting bedclothes, sheets and blankets)?: A Little Difficulty moving from lying on back to sitting on the side of the bed? : Unable Difficulty sitting down on and standing up from a chair with arms (e.g., wheelchair, bedside commode, etc,.)?: Unable Help needed moving to and from a bed to chair (including a wheelchair)?: A Little Help needed walking in hospital room?: A Little Help needed climbing 3-5 steps with a railing? : A Lot 6 Click Score: 13    End of Session Equipment Utilized During Treatment: Gait belt;Oxygen Activity Tolerance: Patient limited by fatigue Patient left: in bed;with call bell/phone within reach;with bed alarm set;with family/visitor present Nurse Communication: Mobility status PT Visit Diagnosis: Muscle weakness (generalized) (M62.81);Difficulty in walking, not elsewhere classified  (R26.2);Unsteadiness on feet (R26.81);Dizziness and giddiness (R42)    Time: 2458-0998 PT Time Calculation (min) (ACUTE ONLY): 20 min   Charges:   PT Evaluation $PT Eval Moderate Complexity: 1 Mod          Wray Kearns, PT, DPT Acute Rehabilitation Services Pager [phone] Office [place] 04/21/2018, 3:49 PM

## 2018-04-21 NOTE — Progress Notes (Signed)
Subjective: Interval History: has no complaint , arm wrapped, had bleeding.  Objective: Vital signs in last 24 hours: Temp:  [97.6 F (36.4 C)-98 F (36.7 C)] 98 F (36.7 C) (10/14 1223) Pulse Rate:  [50-64] 64 (10/14 1223) Resp:  [16-19] 18 (10/14 1223) BP: (93-132)/(46-58) 106/58 (10/14 0445) SpO2:  [95 %-98 %] 98 % (10/14 1223) Weight:  [78.7 kg] 78.7 kg (10/14 0445) Weight change: 2.599 kg  Intake/Output from previous day: 10/13 0701 - 10/14 0700 In: 1320.3 [P.O.:720; I.V.:600.3] Out: -  Intake/Output this shift: No intake/output data recorded.  General appearance: cooperative, mildly obese, pale and slow mentation Resp: diminished breath sounds bilaterally and rales bibasilar Cardio: S1, S2 normal and systolic murmur: systolic ejection 2/6, crescendo and decrescendo at 2nd left intercostal space GI: soft, non-tender; bowel sounds normal; no masses,  no organomegaly Extremities: edema 2+,   and aVF L FA  Lab Results: Recent Labs    04/20/18 0915 04/21/18 0403  WBC 20.4* 20.6*  HGB 9.9* 9.8*  HCT 30.9* 31.4*  PLT 220 253   BMET:  Recent Labs    04/20/18 0915 04/21/18 0403  NA 136 134*  K 3.6 3.8  CL 100 97*  CO2 21* 21*  GLUCOSE 115* 116*  BUN 77* 94*  CREATININE 6.32* 7.69*  CALCIUM 7.8* 7.6*   No results for input(s): PTH in the last 72 hours. Iron Studies: No results for input(s): IRON, TIBC, TRANSFERRIN, FERRITIN in the last 72 hours.  Studies/Results: No results found.  I have reviewed the patient's current medications.  Assessment/Plan: 1 ESRD new, vol xs but better. Improving 2 Anemia esa, Fe, will give iv 3 HPTH ok 4 Debillitaion 5 Afib 6 CVA P HD, follow bleeding, may need shuntogram, counseled   LOS: 3 days   Troy [name] Severe 04/21/2018,12:26 PM

## 2018-04-21 NOTE — Progress Notes (Signed)
Progress Note  Patient Name: Troy [name] Date of Encounter: 04/21/2018  Primary Cardiologist: Kirk Ruths, MD   Subjective   No CP or dyspnea  Inpatient Medications    Scheduled Meds: . sodium chloride   Intravenous Once  . sodium chloride   Intravenous Once  . amiodarone  200 mg Oral Daily  . atorvastatin  20 mg Oral QHS  . Chlorhexidine Gluconate Cloth  6 each Topical Q0600  . Darbepoetin Alfa  60 mcg Subcutaneous Q7 days  . feeding supplement  1 Container Oral BID BM  . feeding supplement (PRO-STAT SUGAR FREE 64)  30 mL Oral Daily  . isosorbide mononitrate  30 mg Oral Daily  . levothyroxine  50 mcg Oral QAC breakfast  . metoprolol tartrate  12.5 mg Oral BID   Continuous Infusions: . sodium chloride    . dextrose 30 mL/hr at 04/21/18 0200   PRN Meds: nitroGLYCERIN, ondansetron **OR** ondansetron (ZOFRAN) IV, zolpidem   Vital Signs    Vitals:   04/20/18 2119 04/20/18 2354 04/21/18 0445 04/21/18 0803  BP: (!) 100/51 (!) 93/46 (!) 106/58   Pulse: (!) 55 (!) 50 (!) 54 (!) 52  Resp:    19  Temp:  97.8 F (36.6 C) 97.6 F (36.4 C) 97.9 F (36.6 C)  TempSrc:  Axillary Oral Oral  SpO2:    97%  Weight:   78.7 kg   Height:        Intake/Output Summary (Last 24 hours) at 04/21/2018 0830 Last data filed at 04/21/2018 0200 Gross per 24 hour  Intake 1080.25 ml  Output -  Net 1080.25 ml   Filed Weights   04/19/18 1700 04/20/18 0418 04/21/18 0445  Weight: 75.6 kg 76.8 kg 78.7 kg    Telemetry    Sinus bradycardia with PVCs- Personally Reviewed  Physical Exam   GEN: No acute distress.   Neck: No JVD Cardiac: RRR, no murmurs, rubs, or gallops.  Respiratory: Diminished BS bases GI: Soft, nontender, non-distended  MS: No edema Neuro:  Nonfocal  Psych: Normal affect   Labs    Chemistry Recent Labs  Lab 04/19/18 0625 04/20/18 0915 04/21/18 0403  NA 136 136 134*  K 3.6 3.6 3.8  CL 99 100 97*  CO2 19* 21* 21*  GLUCOSE 118* 115* 116*  BUN  125* 77* 94*  CREATININE 7.72* 6.32* 7.69*  CALCIUM 7.8* 7.8* 7.6*  GFRNONAA 6* 8* 6*  GFRAA 7* 9* 7*  ANIONGAP 18* 15 16*     Hematology Recent Labs  Lab 04/19/18 0625 04/20/18 0915 04/21/18 0403  WBC 15.4* 20.4* 20.6*  RBC 2.53* 3.17* 3.25*  HGB 7.6* 9.9* 9.8*  HCT 24.5* 30.9* 31.4*  MCV 96.8 97.5 96.6  MCH 30.0 31.2 30.2  MCHC 31.0 32.0 31.2  RDW 19.9* 20.0* 19.7*  PLT 251 220 253    Cardiac Enzymes Recent Labs  Lab 04/18/18 2342 04/19/18 0625 04/19/18 1216  TROPONINI 8.18* 7.96* 7.93*    Recent Labs  Lab 04/18/18 1210  TROPIPOC 8.79*     BNP Recent Labs  Lab 04/18/18 1200  BNP >4,500.0*     Patient Profile     77 y.o. male admitted with non-ST elevation myocardial infarction.  Also with history of chronic kidney disease and dialysis has now been initiated.  Also with history of paroxysmal atrial fibrillation, chronic combined systolic/diastolic congestive heart failure, hypertension, hyperlipidemia, prior CVA, peripheral vascular disease.  Has required transfusion this admission for bleeding from AV fistula.  Echocardiogram  shows newly reduced LV function with ejection fraction 25 to 30%.  There is moderate mitral regurgitation.  Assessment & Plan    1 non-ST elevation myocardial infarction-add aspirin 81 mg daily.  Continue statin.  We will await palliative care consult.  If we want to be aggressive can proceed with cardiac catheterization later this week.  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  Continue low-dose beta-blocker for now.  Will not add hydralazine to nitrates as blood pressure is borderline.  3 acute on chronic kidney disease-patient has now been initiated on dialysis.  Per nephrology.  4 paroxysmal atrial fibrillation-patient remains in sinus rhythm.  Coumadin is on hold for possible procedures.  Continue amiodarone and metoprolol.  5 acute systolic congestive heart failure-volume is being managed by dialysis.  6 acute on chronic  anemia-likely related to end-stage renal disease with now contribution from acute blood loss anemia from AV fistula.  Lobe and improved with transfusion.  For questions or updates, please contact [place] Please consult [email or web address] for contact info under        Signed, Kirk Ruths, MD  04/21/2018, 8:30 AM
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
## 2018-04-22 LAB — CBC WITH DIFFERENTIAL/PLATELET
Basophils Absolute: 0 10*3/uL (ref 0.0–0.1)
Basophils Relative: 0 %
Eosinophils Absolute: 0.3 10*3/uL (ref 0.0–0.5)
Eosinophils Relative: 2 %
HCT: 34 % — ABNORMAL LOW (ref 39.0–52.0)
Hemoglobin: 10.9 g/dL — ABNORMAL LOW (ref 13.0–17.0)
Lymphocytes Relative: 5 %
Lymphs Abs: 0.9 10*3/uL (ref 0.7–4.0)
MCH: 30.6 pg (ref 26.0–34.0)
MCHC: 32.1 g/dL (ref 30.0–36.0)
MCV: 95.5 fL (ref 80.0–100.0)
Monocytes Absolute: 0.3 10*3/uL (ref 0.1–1.0)
Monocytes Relative: 2 %
Myelocytes: 1 %
Neutro Abs: 15.7 10*3/uL — ABNORMAL HIGH (ref 1.7–7.7)
Neutrophils Relative %: 87 %
Platelets: 226 10*3/uL (ref 150–400)
Promyelocytes Relative: 3 %
RBC: 3.56 MIL/uL — ABNORMAL LOW (ref 4.22–5.81)
RDW: 19.6 % — ABNORMAL HIGH (ref 11.5–15.5)
WBC: 17.3 10*3/uL — ABNORMAL HIGH (ref 4.0–10.5)
nRBC: 0 /100{WBCs}
nRBC: 1 % — ABNORMAL HIGH (ref 0.0–0.2)

## 2018-04-22 MED ORDER — SODIUM CHLORIDE 0.9% FLUSH: 3.0000 mL | Freq: Two times a day (BID) | INTRAVENOUS | Status: DC

## 2018-04-22 MED ORDER — RENA-VITE PO TABS
1.0000 | ORAL_TABLET | Freq: Every day | ORAL | Status: DC
  Administered 2018-04-22 – 2018-05-07 (×16): 1 via ORAL
  Filled 2018-04-22 (×16): qty 1

## 2018-04-22 MED ORDER — SODIUM CHLORIDE 0.9 % IV SOLN: 250.0000 mL | INTRAVENOUS | Status: DC | PRN

## 2018-04-22 MED ORDER — ASPIRIN 81 MG PO CHEW
81.0000 mg | CHEWABLE_TABLET | ORAL | Status: AC
  Administered 2018-04-23: 81 mg via ORAL
  Filled 2018-04-22: qty 1

## 2018-04-22 MED ORDER — SODIUM CHLORIDE 0.9% FLUSH: 3.0000 mL | INTRAVENOUS | Status: DC | PRN

## 2018-04-22 MED ORDER — CHLORHEXIDINE GLUCONATE CLOTH 2 % EX PADS
6.0000 | MEDICATED_PAD | Freq: Every day | CUTANEOUS | Status: DC
  Administered 2018-04-22: 6 via TOPICAL

## 2018-04-22 MED ORDER — SODIUM CHLORIDE 0.9 % IV SOLN
INTRAVENOUS | Status: DC
  Administered 2018-04-23: 07:00:00 via INTRAVENOUS
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
## 2018-04-22 NOTE — Progress Notes (Signed)
Physical Therapy Treatment Patient Details Name: Troy [name] MRN: [id number] DOB: 05-17-1941 Today's Date: 04/22/2018    History of Present Illness Patient is a 77 y/o male who presents with chest pain. 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 PMH includes stroke, PAF, HTN, CKD, CHF.    PT Comments    Patient progressing well towards PT goals. Improved ambulation distance today with Min A for balance/safety. Pt with 2/4 DOE and Sp02 dropped to 87% on 1 L/min 02.  Fatigues quickly and demonstrates BLE weakness. Hr ranged from 50-60s bpm. Pt tired from having dialysis very early this AM and wanted to rest. Will continue to follow and progress as tolerated.   Follow Up Recommendations  SNF;Supervision for mobility/OOB     Equipment Recommendations  None recommended by PT    Recommendations for Other Services       Precautions / Restrictions Precautions Precautions: Fall Precaution Comments: soft BP Restrictions Weight Bearing Restrictions: No    Mobility  Bed Mobility Overal bed mobility: Needs Assistance Bed Mobility: Supine to Sit;Sit to Supine     Supine to sit: Mod assist;HOB elevated Sit to supine: Min guard;HOB elevated   General bed mobility comments: Assist to elevate trunk to get to EOB. Able to bring LEs into bed to return to supine.  Transfers Overall transfer level: Needs assistance Equipment used: Rolling walker (2 wheeled) Transfers: Sit to/from Stand Sit to Stand: Mod assist         General transfer comment: Assist to power to standing with cues for hand placement/technique,. + dizziness. posterior lean.  Ambulation/Gait Ambulation/Gait assistance: Min assist Gait Distance (Feet): 35 Feet Assistive device: Rolling walker (2 wheeled) Gait Pattern/deviations: Step-to pattern;Trunk flexed;Antalgic;Decreased step length - right;Decreased stance time - left Gait velocity: decreased   General Gait Details: Slow, antalgic like gait  pattern with use of RW; cues for RW management. 2/4 DOE. Sp02 dropped to 87% on1 L/min 02. HR 50-60s bpm.   Stairs             Wheelchair Mobility    Modified Rankin (Stroke Patients Only)       Balance Overall balance assessment: Needs assistance Sitting-balance support: Feet supported;Single extremity supported Sitting balance-Leahy Scale: Fair     Standing balance support: During functional activity;Bilateral upper extremity supported Standing balance-Leahy Scale: Poor Standing balance comment: Reliant on RW for standing.                            Cognition Arousal/Alertness: Awake/alert Behavior During Therapy: WFL for tasks assessed/performed Overall Cognitive Status: Within Functional Limits for tasks assessed                                        Exercises      General Comments        Pertinent Vitals/Pain Pain Assessment: No/denies pain    Home Living                      Prior Function            PT Goals (current goals can now be found in the care plan section) Progress towards PT goals: Progressing toward goals    Frequency    Min 2X/week      PT Plan Current plan remains appropriate    Co-evaluation  AM-PAC PT "6 Clicks" Daily Activity  Outcome Measure  Difficulty turning over in bed (including adjusting bedclothes, sheets and blankets)?: A Little Difficulty moving from lying on back to sitting on the side of the bed? : Unable Difficulty sitting down on and standing up from a chair with arms (e.g., wheelchair, bedside commode, etc,.)?: Unable Help needed moving to and from a bed to chair (including a wheelchair)?: A Little Help needed walking in hospital room?: A Little Help needed climbing 3-5 steps with a railing? : A Lot 6 Click Score: 13    End of Session Equipment Utilized During Treatment: Gait belt;Oxygen Activity Tolerance: Patient limited by fatigue;Patient  tolerated treatment well Patient left: in bed;with call bell/phone within reach;with bed alarm set Nurse Communication: Mobility status PT Visit Diagnosis: Muscle weakness (generalized) (M62.81);Difficulty in walking, not elsewhere classified (R26.2);Unsteadiness on feet (R26.81);Dizziness and giddiness (R42)     Time: 9163-8466 PT Time Calculation (min) (ACUTE ONLY): 24 min  Charges:  $Gait Training: 8-22 mins $Therapeutic Activity: 8-22 mins                     Wray [place], [place], DPT Acute Rehabilitation Services Pager [phone] Office [place] 04/22/2018, 9:12 AM

## 2018-04-22 NOTE — Progress Notes (Signed)
Progress Note  Patient Name: Troy [name] Date of Encounter: 04/22/2018  Primary Cardiologist: Kirk Ruths, MD   Subjective   No CP or dyspnea  Inpatient Medications    Scheduled Meds: . sodium chloride   Intravenous Once  . sodium chloride   Intravenous Once  . amiodarone  200 mg Oral Daily  . aspirin EC  81 mg Oral Daily  . atorvastatin  20 mg Oral QHS  . [START ON 04/25/2018] Darbepoetin Alfa  200 mcg Intravenous Q Fri-HD  . feeding supplement  1 Container Oral BID BM  . feeding supplement (PRO-STAT SUGAR FREE 64)  30 mL Oral Daily  . isosorbide mononitrate  30 mg Oral Daily  . levothyroxine  50 mcg Oral QAC breakfast  . lidocaine-prilocaine   Topical Q M,W,F  . metoprolol tartrate  12.5 mg Oral BID   Continuous Infusions: . sodium chloride    . dextrose 30 mL/hr at 04/21/18 0200  . [START ON 04/23/2018] ferric gluconate (FERRLECIT/NULECIT) IV     PRN Meds: nitroGLYCERIN, ondansetron **OR** ondansetron (ZOFRAN) IV, zolpidem   Vital Signs    Vitals:   04/22/18 0430 04/22/18 0441 04/22/18 0540 04/22/18 0809  BP: 129/60 131/61 (!) 121/55 (!) 122/54  Pulse: (!) 56 (!) 57 (!) 58 (!) 53  Resp:  18 19   Temp:  97.7 F (36.5 C) 98.4 F (36.9 C) 98.6 F (37 C)  TempSrc:  Oral Oral Oral  SpO2:  98% 97% 95%  Weight:  79.2 kg    Height:        Intake/Output Summary (Last 24 hours) at 04/22/2018 0821 Last data filed at 04/22/2018 0441 Gross per 24 hour  Intake 240 ml  Output 500 ml  Net -260 ml   Filed Weights   04/21/18 0445 04/22/18 0110 04/22/18 0441  Weight: 78.7 kg 80.2 kg 79.2 kg    Telemetry    Sinus bradycardia with PVCs- Personally Reviewed  Physical Exam   GEN: No acute distress. Chronically ill appearing   Neck: No JVD, supple Cardiac: RRR Respiratory: CTA GI: Soft, NT/ND MS: No edema Neuro:  Grossly intact   Labs    Chemistry Recent Labs  Lab 04/19/18 0625 04/20/18 0915 04/21/18 0403  NA 136 136 134*  K 3.6 3.6 3.8  CL  99 100 97*  CO2 19* 21* 21*  GLUCOSE 118* 115* 116*  BUN 125* 77* 94*  CREATININE 7.72* 6.32* 7.69*  CALCIUM 7.8* 7.8* 7.6*  GFRNONAA 6* 8* 6*  GFRAA 7* 9* 7*  ANIONGAP 18* 15 16*     Hematology Recent Labs  Lab 04/20/18 0915 04/21/18 0403 04/22/18 0615  WBC 20.4* 20.6* 17.3*  RBC 3.17* 3.25* 3.56*  HGB 9.9* 9.8* 10.9*  HCT 30.9* 31.4* 34.0*  MCV 97.5 96.6 95.5  MCH 31.2 30.2 30.6  MCHC 32.0 31.2 32.1  RDW 20.0* 19.7* 19.6*  PLT 220 253 226    Cardiac Enzymes Recent Labs  Lab 04/18/18 2342 04/19/18 0625 04/19/18 1216  TROPONINI 8.18* 7.96* 7.93*    Recent Labs  Lab 04/18/18 1210  TROPIPOC 8.79*     BNP Recent Labs  Lab 04/18/18 1200  BNP >4,500.0*     Patient Profile     77 y.o. male admitted with non-ST elevation myocardial infarction.  Also with history of chronic kidney disease and dialysis has now been initiated.  Also with history of paroxysmal atrial fibrillation, chronic combined systolic/diastolic congestive heart failure, hypertension, hyperlipidemia, prior CVA, peripheral vascular disease.  Has required transfusion this admission for bleeding from AV fistula.  Echocardiogram shows newly reduced LV function with ejection fraction 25 to 30%.  There is moderate mitral regurgitation.  Assessment & Plan    1 non-ST elevation myocardial infarction-continue aspirin and statin.  Patient and daughter would like all measures at this point.  Plan cardiac catheterization tomorrow.  Doubt he would be a candidate for coronary artery bypass and graft but may be candidate for PCI.  The risks and benefits of cardiac catheterization including myocardial infarction, CVA and death discussed and he agrees to proceed.  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  Continue low-dose beta-blocker for now (will transition to toprol prior to DC).  Will not add hydralazine to nitrates as blood pressure is borderline.  3 acute on chronic kidney disease-now on dialysis and  followed by nephrology.  4 paroxysmal atrial fibrillation-patient remains in sinus rhythm.  Resume Coumadin following procedures.  She is  5 acute systolic congestive heart failure-volume is being managed by dialysis.  6 acute on chronic anemia-likely related to end-stage renal disease with now contribution from acute blood loss anemia from AV fistula.  Hemoglobin stable this morning.  For questions or updates, please contact [place] Please consult [email or web address] for contact info under        Signed, Kirk Ruths, MD  04/22/2018, 8:21 AM

## 2018-04-22 NOTE — Clinical Social Work Note (Signed)
Clinical Social Work Assessment  Patient Details  Name: Troy [name] MRN: [id number] Date of Birth: 1941-01-14  Date of referral:  04/22/18               Reason for consult:  Facility Placement, Discharge Planning                Permission sought to share information with:  Family Supports, Customer service manager Permission granted to share information::  Yes, Verbal Permission Granted  Name::     Troy [name]  Agency::  SNFs  Relationship::  daughter  Contact Information:  [phone]  Housing/Transportation Living arrangements for the past 2 months:  [place] of Information:  Patient, Adult Children Patient Interpreter Needed:  None Criminal Activity/Legal Involvement Pertinent to Current Situation/Hospitalization:  No - Comment as needed Significant Relationships:  Adult Children, Spouse Lives with:  Spouse Do you feel safe going back to the place where you live?  Yes Need for family participation in patient care:  Yes (Comment)  Care giving concerns: Patient from home with wife, who has dementia. PT recommending SNF.   Social Worker assessment / plan: CSW met with patient, daughter Troy [name] Feeling friend, and patient's spouse. Patient alert and oriented. CSW introduced self and role and discussed disposition planning - PT recommendation for SNF.   Patient and daughter reported that patient is caregiver for patient's spouse, who has dementia. Patient uses a rollator for mobility at home and is generally able to complete ADLs independently. Patient and daughter agreeable fax out referrals to SNF. Daughter indicated she does want to have a conversation with patient and make sure he is fully agreeable before choosing SNF. Did also discuss that patient awaiting clip to outpatient dialysis and considerations for SNF that will transport to HD.  Daughter had questions about transportation, home health aides, and FMLA paperwork, as she will be taking time  off work to care for her mother/patient's spouse, while patient in SNF. CSW to provide resources and refer to MD/PCP for support with FMLA paperwork.  CSW sent out initial SNF referrals and will provide bed offers when available. Patient will require [facility] authorization before admitting to a facility. CSW to follow and support with discharge planning.   Employment status:  Retired Research officer, political party) PT Recommendations:  [place] / Referral to community resources:  [place]  Patient/Family's Response to care: Patient and family appreciative of care.  Patient/Family's Understanding of and Emotional Response to Diagnosis, Current Treatment, and Prognosis: Patient and family with understanding of patient's condition and agreeable to SNF.  Emotional Assessment Appearance:  Appears stated age Attitude/Demeanor/Rapport:  Engaged Affect (typically observed):  Accepting, Calm, Appropriate, Pleasant Orientation:  Oriented to Self, Oriented to Place, Oriented to  Time, Oriented to Situation Alcohol / Substance use:  Not Applicable Psych involvement (Current and /or in the community):  No (Comment)  Discharge Needs  Concerns to be addressed:  Discharge Planning Concerns, Care Coordination Readmission within the last 30 days:  No Current discharge risk:  Physical Impairment, Chronically ill Barriers to Discharge:  Continued Medical Work up, Ship broker, Waiting for outpatient dialysis   Troy Emms, LCSW 04/22/2018, 11:58 AM

## 2018-04-22 NOTE — H&P (View-Only) (Signed)
Progress Note  Patient Name: Troy [name] Date of Encounter: 04/22/2018  Primary Cardiologist: Kirk Ruths, MD   Subjective   No CP or dyspnea  Inpatient Medications    Scheduled Meds: . sodium chloride   Intravenous Once  . sodium chloride   Intravenous Once  . amiodarone  200 mg Oral Daily  . aspirin EC  81 mg Oral Daily  . atorvastatin  20 mg Oral QHS  . [START ON 04/25/2018] Darbepoetin Alfa  200 mcg Intravenous Q Fri-HD  . feeding supplement  1 Container Oral BID BM  . feeding supplement (PRO-STAT SUGAR FREE 64)  30 mL Oral Daily  . isosorbide mononitrate  30 mg Oral Daily  . levothyroxine  50 mcg Oral QAC breakfast  . lidocaine-prilocaine   Topical Q M,W,F  . metoprolol tartrate  12.5 mg Oral BID   Continuous Infusions: . sodium chloride    . dextrose 30 mL/hr at 04/21/18 0200  . [START ON 04/23/2018] ferric gluconate (FERRLECIT/NULECIT) IV     PRN Meds: nitroGLYCERIN, ondansetron **OR** ondansetron (ZOFRAN) IV, zolpidem   Vital Signs    Vitals:   04/22/18 0430 04/22/18 0441 04/22/18 0540 04/22/18 0809  BP: 129/60 131/61 (!) 121/55 (!) 122/54  Pulse: (!) 56 (!) 57 (!) 58 (!) 53  Resp:  18 19   Temp:  97.7 F (36.5 C) 98.4 F (36.9 C) 98.6 F (37 C)  TempSrc:  Oral Oral Oral  SpO2:  98% 97% 95%  Weight:  79.2 kg    Height:        Intake/Output Summary (Last 24 hours) at 04/22/2018 0821 Last data filed at 04/22/2018 0441 Gross per 24 hour  Intake 240 ml  Output 500 ml  Net -260 ml   Filed Weights   04/21/18 0445 04/22/18 0110 04/22/18 0441  Weight: 78.7 kg 80.2 kg 79.2 kg    Telemetry    Sinus bradycardia with PVCs- Personally Reviewed  Physical Exam   GEN: No acute distress. Chronically ill appearing   Neck: No JVD, supple Cardiac: RRR Respiratory: CTA GI: Soft, NT/ND MS: No edema Neuro:  Grossly intact   Labs    Chemistry Recent Labs  Lab 04/19/18 0625 04/20/18 0915 04/21/18 0403  NA 136 136 134*  K 3.6 3.6 3.8  CL  99 100 97*  CO2 19* 21* 21*  GLUCOSE 118* 115* 116*  BUN 125* 77* 94*  CREATININE 7.72* 6.32* 7.69*  CALCIUM 7.8* 7.8* 7.6*  GFRNONAA 6* 8* 6*  GFRAA 7* 9* 7*  ANIONGAP 18* 15 16*     Hematology Recent Labs  Lab 04/20/18 0915 04/21/18 0403 04/22/18 0615  WBC 20.4* 20.6* 17.3*  RBC 3.17* 3.25* 3.56*  HGB 9.9* 9.8* 10.9*  HCT 30.9* 31.4* 34.0*  MCV 97.5 96.6 95.5  MCH 31.2 30.2 30.6  MCHC 32.0 31.2 32.1  RDW 20.0* 19.7* 19.6*  PLT 220 253 226    Cardiac Enzymes Recent Labs  Lab 04/18/18 2342 04/19/18 0625 04/19/18 1216  TROPONINI 8.18* 7.96* 7.93*    Recent Labs  Lab 04/18/18 1210  TROPIPOC 8.79*     BNP Recent Labs  Lab 04/18/18 1200  BNP >4,500.0*     Patient Profile     77 y.o. male admitted with non-ST elevation myocardial infarction.  Also with history of chronic kidney disease and dialysis has now been initiated.  Also with history of paroxysmal atrial fibrillation, chronic combined systolic/diastolic congestive heart failure, hypertension, hyperlipidemia, prior CVA, peripheral vascular disease.  Has required transfusion this admission for bleeding from AV fistula.  Echocardiogram shows newly reduced LV function with ejection fraction 25 to 30%.  There is moderate mitral regurgitation.  Assessment & Plan    1 non-ST elevation myocardial infarction-continue aspirin and statin.  Patient and daughter would like all measures at this point.  Plan cardiac catheterization tomorrow.  Doubt he would be a candidate for coronary artery bypass and graft but may be candidate for PCI.  The risks and benefits of cardiac catheterization including myocardial infarction, CVA and death discussed and he agrees to proceed.  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  Continue low-dose beta-blocker for now (will transition to toprol prior to DC).  Will not add hydralazine to nitrates as blood pressure is borderline.  3 acute on chronic kidney disease-now on dialysis and  followed by nephrology.  4 paroxysmal atrial fibrillation-patient remains in sinus rhythm.  Resume Coumadin following procedures.  She is  5 acute systolic congestive heart failure-volume is being managed by dialysis.  6 acute on chronic anemia-likely related to end-stage renal disease with now contribution from acute blood loss anemia from AV fistula.  Hemoglobin stable this morning.  For questions or updates, please contact [place] Please consult [email or web address] for contact info under        Signed, Kirk Ruths, MD  04/22/2018, 8:21 AM

## 2018-04-22 NOTE — Care Management Note (Addendum)
Case Management Note  Patient Details  Name: Troy [name] MRN: [id number] Date of Birth: Sep 15, 1940  Subjective/Objective: Pt presented for Acute on Chronic CHF- New ESRD. HD initiated- Nephrology following for New HD- CLIP in process. Cardiology following for NStemi- Plan for cath once renal function is better. Palliative Care consulted and following the patient. CSW following for SNF placement.                  Action/Plan: CM will continue to monitor for additional disposition needs.   Expected Discharge Date:                  Expected Discharge Plan:  Skilled Nursing Facility  In-House Referral:  Clinical Social Work, Hospice / Palliative Care  Discharge planning Services  CM Consult  Post Acute Care Choice:   N/A Choice offered to:   N/A  DME Arranged:   N/A DME Agency:   N/A  HH Arranged:   N/A HH Agency:   N/A  Status of Service:  Completed  If discussed at [place] of Stay Meetings, dates discussed:    Additional Comments: 1605 05-07-18 Jacqlyn Krauss, RN,BSN [phone] Plan for SNF- Awaiting authorization. CSW following.  Bethena Roys, RN 04/22/2018, 10:44 AM

## 2018-04-22 NOTE — Progress Notes (Signed)
Subjective: Interval History: has no complaint, arm not bleeding.  Objective: Vital signs in last 24 hours: Temp:  [97.7 F (36.5 C)-98.6 F (37 C)] 98.6 F (37 C) (10/15 0809) Pulse Rate:  [48-64] 53 (10/15 0809) Resp:  [15-21] 19 (10/15 0540) BP: (106-131)/(48-61) 122/54 (10/15 0809) SpO2:  [94 %-98 %] 95 % (10/15 0809) Weight:  [79.2 kg-80.2 kg] 79.2 kg (10/15 0441) Weight change: 1.501 kg  Intake/Output from previous day: 10/14 0701 - 10/15 0700 In: 240 [P.O.:60; I.V.:180] Out: 500  Intake/Output this shift: No intake/output data recorded.  General appearance: alert, cooperative, no distress and mildly obese Resp: diminished breath sounds bilaterally and rales bibasilar Cardio: S1, S2 normal and systolic murmur: systolic ejection 2/6, crescendo and decrescendo at 2nd left intercostal space GI: soft, liver down 4 cm, pos bs Extremities: bruises, LLA avf , B&T  Lab Results: Recent Labs    04/21/18 0403 04/22/18 0615  WBC 20.6* 17.3*  HGB 9.8* 10.9*  HCT 31.4* 34.0*  PLT 253 226   BMET:  Recent Labs    04/20/18 0915 04/21/18 0403  NA 136 134*  K 3.6 3.8  CL 100 97*  CO2 21* 21*  GLUCOSE 115* 116*  BUN 77* 94*  CREATININE 6.32* 7.69*  CALCIUM 7.8* 7.6*   No results for input(s): PTH in the last 72 hours. Iron Studies: No results for input(s): IRON, TIBC, TRANSFERRIN, FERRITIN in the last 72 hours.  Studies/Results: No results found.  I have reviewed the patient's current medications.  Assessment/Plan: 1 ESRD HD yest ok.  Vol xs mild.  2 Anemia esa/Fe 3 HPTH vit D 4 MI, for cath 5 Hx CVA 6 PAF P HD tomorrow, esa, vit D , Cath    LOS: 4 days   Troy [name] 04/22/2018,9:22 AM

## 2018-04-22 NOTE — Progress Notes (Addendum)
Patient Demographics:    Troy [name], is a 77 y.o. male, DOB - [birth year], AYO:[id number]  Admit date - 04/18/2018   Admitting Physician Merton Border, MD  Outpatient Primary MD for the patient is Gaynelle Arabian, MD  LOS - 4   Chief Complaint  Patient presents with  . Chest Pain        Subjective:    Troy [name] today has no fevers, no emesis,  No cp, no sob, resting  Assessment  & Plan :    Principal Problem:   Acute on chronic systolic heart failure (HCC) Active Problems:   CKD (chronic kidney disease), stage V (HCC)   ESRD (end stage renal disease) (HCC)   Leukocytosis   Atypical chest pain   Paroxysmal A-fib (HCC)   NSTEMI (non-ST elevated myocardial infarction) ([place])   Myocardial infarction [facility])   Palliative care by specialist   DNR (do not resuscitate) discussion  Brief Summary:- 77 y.o.malewith a hx of stage 5 CKD not on HD, abnormal stress test in 07/2016 (pt declined cath due to concerns regarding worsening renal function and need for HD), PVD with carotid artery occlusion s/p left CEA in 2011, atrial fibrillation, chronic anticoagulation with coumadin, chronic combined systolic and diastolic HF (EF 23-95% by echo in 2017), moderate MR, HTN, HLD and h/o strokeadmitted 04/18/18 with chest pains and elevated troponin, going for [facility] 04/23/18   Plan:- 1)Chest Pain/NSTEMI--- chest pain-free, positive Nuclear stress test in January 2018, EF is low as noted in #2 below, medical management for now, 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 Patient and daughter/son want left heart catheter possible angioplasty with stent placement if indicated,   2)HFrEF--- Echo from 04/19/2018 with EF in  the 25 to 30% range which is lower than previous history of chronic combined diastolic and systolic dysfunction CHF, going for [facility] 04/23/18, volume status has been addressed with hemodialysis session, continue isosorbide, unable to add hydralazine due to soft BP  3)PAFib--- INR was supratherapeutic on admission, now 1.4 from over 4 previously after vitamin K (patient received 2.5 mg of vitamin K on 04/19/2018 and another 2.5 mg on 04/20/2018), continue amiodarone , c/n metoprolol 12.5 mg bid, await findings of  LHC 04/23/18 to determine anticoagulation strategy  4)PAD/PVD-- s/p Carotid Endarterectomy in 2011, continue Lipitor, INR is down to 1.4 after vitamin K see #3 above  5)Chronic Anemia of CKD--- suspect superimposed acute blood loss from left arm AV fistula site in the setting of supratherapeutic INR, patient was transfused 1 unit on 04/18/2018 , received another 2 units on 04/19/2018, hemoglobin is 10.9 now after a total of 3 units of packed cells,  EPO agent per nephrology team  6)ESRD--patient admitted with uremia, hemodialysis per nephrology team via left RC AVF--clip process has been started, first HD session was 04/18/2018 ,  confusion and uremia appears to be improving with HD, more coherent, next HD 04/23/18 after LHC  7) hypothyroidism--- stable, continue levothyroxine 50 mg daily  8) leukocytosis--white count down to 17 k from  20K, ????  Etiology, no fevers, no obvious acute infection at this time, cultures negative so far   Disposition/Need for in-Hospital Stay- patient unable to be discharged at this time due to NSTEMI with significant worsening EF, patient needs left heart catheterization on 04/23/18, also initiated on hemodialysis due to uremia and ESRD, will need outpatient hemodialysis spot/chair prior to discharge   Code Status : Full  Disposition Plan  : TBD---??? SNF,   Consults  : Cardiology/nephrology   DVT Prophylaxis  : Coumadin  (on hold as INR was  Supratherapeutic and patient was bleeding) for [facility] 04/23/18  Lab Results  Component Value Date   PLT 226 04/22/2018    Inpatient Medications  Scheduled Meds: . sodium chloride   Intravenous Once  . sodium chloride   Intravenous Once  . amiodarone  200 mg Oral Daily  . aspirin EC  81 mg Oral Daily  . atorvastatin  20 mg Oral QHS  . Chlorhexidine Gluconate Cloth  6 each Topical Q0600  . [START ON 04/25/2018] Darbepoetin Alfa  200 mcg Intravenous Q Fri-HD  . feeding supplement  1 Container Oral BID BM  . feeding supplement (PRO-STAT SUGAR FREE 64)  30 mL Oral Daily  . levothyroxine  50 mcg Oral QAC breakfast  . lidocaine-prilocaine   Topical Q M,W,F  . metoprolol tartrate  12.5 mg Oral BID  . multivitamin  1 tablet Oral QHS   Continuous Infusions: . sodium chloride    . dextrose 30 mL/hr at 04/21/18 0200  . [START ON 04/23/2018] ferric gluconate (FERRLECIT/NULECIT) IV     PRN Meds:.nitroGLYCERIN, ondansetron **OR** ondansetron (ZOFRAN) IV, zolpidem    Anti-infectives (From admission, onward)   None        Objective:   Vitals:   04/22/18 0540 04/22/18 0809 04/22/18 0950 04/22/18 1122  BP: (!) 121/55 (!) 122/54 (!) 113/52 (!) 105/52  Pulse: (!) 58 (!) 53 60 (!) 54  Resp: 19   (!) 25  Temp: 98.4 F (36.9 C) 98.6 F (37 C)  (!) 97.3 F (36.3 C)  TempSrc: Oral Oral  Oral  SpO2: 97% 95%  95%  Weight:      Height:        Wt Readings from Last 3 Encounters:  04/22/18 79.2 kg  03/07/18 78.5 kg  02/21/18 78 kg     Intake/Output Summary (Last 24 hours) at 04/22/2018 1137 Last data filed at 04/22/2018 1041 Gross per 24 hour  Intake 767.9 ml  Output 500 ml  Net 267.9 ml    Physical Exam Patient is examined daily including today on 04/22/18, exams remain the same as of yesterday except that has changed  Gen:- Awake Alert, no acute distress HEENT:- [place].AT, No sclera icterus Ears-Hard of hearing Neck-Supple Neck,No JVD,.  Lungs-diminished in bases without  wheezing or rales CV- S1, S2 normal, history of A. fib actually regular at this time , left subclavian area with MediPort in situ Abd-  +ve B.Sounds, Abd Soft, No tenderness,    Extremity/Skin:-Left forearm with AV fistula with positive thrill and bruit, No further bleeding Psych-affect is appropriate, oriented x3 Neuro-no new focal deficits, no tremors   Data Review:   Micro Results Recent Results (from the past 240 hour(s))  MRSA PCR Screening     Status: None   Collection Time: 04/18/18 10:00 PM  Result Value Ref Range Status   MRSA by PCR NEGATIVE NEGATIVE Final    Comment:        The GeneXpert MRSA Assay (FDA approved  for NASAL specimens only), is one component of a comprehensive MRSA colonization surveillance program. It is not intended to diagnose MRSA infection nor to guide or monitor treatment for MRSA infections. Performed at [facility], [place] [address], [place], [place] [address]    Radiology Reports Dg Chest 2 View  Result Date: 04/18/2018 CLINICAL DATA:  Chest pain and shortness of breath EXAM: CHEST - 2 VIEW COMPARISON:  Nov 09, 2017 FINDINGS: The mediastinal contour is normal. Heart size is enlarged. There is pulmonary edema. There are small bilateral posterior pleural effusions. No focal pneumonia is identified. The visualized skeletal structures are stable. IMPRESSION: Congestive heart failure. Electronically Signed   By: Abelardo Diesel M.D.   On: 04/18/2018 12:44    CBC Recent Labs  Lab 04/18/18 1200 04/19/18 0625 04/20/18 0915 04/21/18 0403 04/22/18 0615  WBC 24.2* 15.4* 20.4* 20.6* 17.3*  HGB 7.9* 7.6* 9.9* 9.8* 10.9*  HCT 25.6* 24.5* 30.9* 31.4* 34.0*  PLT 280 251 220 253 226  MCV 102.8* 96.8 97.5 96.6 95.5  MCH 31.7 30.0 31.2 30.2 30.6  MCHC 30.9 31.0 32.0 31.2 32.1  RDW 18.8* 19.9* 20.0* 19.7* 19.6*  LYMPHSABS  --   --   --   --  0.9  MONOABS  --   --   --   --  0.3  EOSABS  --   --   --   --  0.3  BASOSABS  --   --   --   --  0.0     Chemistries  Recent Labs  Lab 04/18/18 1200 04/19/18 0625 04/20/18 0915 04/21/18 0403  NA 138 136 136 134*  K 4.2 3.6 3.6 3.8  CL 100 99 100 97*  CO2 14* 19* 21* 21*  GLUCOSE 137* 118* 115* 116*  BUN 186* 125* 77* 94*  CREATININE 9.51* 7.72* 6.32* 7.69*  CALCIUM 8.4* 7.8* 7.8* 7.6*   ------------------------------------------------------------------------------------------------------------------ No results for input(s): CHOL, HDL, LDLCALC, TRIG, CHOLHDL, LDLDIRECT in the last 72 hours.  Lab Results  Component Value Date   HGBA1C 5.0 06/05/2016   ------------------------------------------------------------------------------------------------------------------ No results for input(s): TSH, T4TOTAL, T3FREE, THYROIDAB in the last 72 hours.  Invalid input(s): FREET3 ------------------------------------------------------------------------------------------------------------------ No results for input(s): VITAMINB12, FOLATE, FERRITIN, TIBC, IRON, RETICCTPCT in the last 72 hours.  Coagulation profile Recent Labs  Lab 04/18/18 1200 04/19/18 0625 04/20/18 0915 04/20/18 1229 04/21/18 0403  INR 4.56* 4.10* 2.21 1.96 1.43    No results for input(s): DDIMER in the last 72 hours.  Cardiac Enzymes Recent Labs  Lab 04/18/18 2342 04/19/18 0625 04/19/18 1216  TROPONINI 8.18* 7.96* 7.93*   ------------------------------------------------------------------------------------------------------------------    Component Value Date/Time   BNP >4,500.0 (H) 04/18/2018 1200   Lorenza Winkleman M.D on 04/22/2018 at 11:37 AM  Pager---[phone] Go to [email or web address] - password [email or web address] for contact info  [facility] - Office  [phone]

## 2018-04-23 ENCOUNTER — Encounter (HOSPITAL_COMMUNITY): Payer: Self-pay | Admitting: Cardiology

## 2018-04-23 ENCOUNTER — Encounter (HOSPITAL_COMMUNITY)
Admission: EM | Disposition: A | Discharge status: Skilled Nursing Facility | Payer: Self-pay | Source: Home / Self Care | Attending: Internal Medicine

## 2018-04-23 DIAGNOSIS — I2510 Atherosclerotic heart disease of native coronary artery without angina pectoris: Secondary | ICD-10-CM

## 2018-04-23 HISTORY — PX: LEFT HEART CATH AND CORONARY ANGIOGRAPHY: CATH118249

## 2018-04-23 LAB — BASIC METABOLIC PANEL
Anion gap: 14 (ref 5–15)
BUN: 78 mg/dL — ABNORMAL HIGH (ref 8–23)
CALCIUM: 7.5 mg/dL — AB (ref 8.9–10.3)
CO2: 21 mmol/L — AB (ref 22–32)
CREATININE: 7.46 mg/dL — AB (ref 0.61–1.24)
Chloride: 97 mmol/L — ABNORMAL LOW (ref 98–111)
GFR calc non Af Amer: 6 mL/min — ABNORMAL LOW (ref >60)
GFR, EST AFRICAN AMERICAN: 7 mL/min — AB (ref >60)
Glucose, Bld: 113 mg/dL — ABNORMAL HIGH (ref 70–99)
Potassium: 4 mmol/L (ref 3.5–5.1)
SODIUM: 132 mmol/L — AB (ref 135–145)

## 2018-04-23 LAB — CBC
HEMATOCRIT: 32.2 % — AB (ref 39.0–52.0)
Hemoglobin: 10.1 g/dL — ABNORMAL LOW (ref 13.0–17.0)
MCH: 30.5 pg (ref 26.0–34.0)
MCHC: 31.4 g/dL (ref 30.0–36.0)
MCV: 97.3 fL (ref 80.0–100.0)
Platelets: 221 10*3/uL (ref 150–400)
RBC: 3.31 MIL/uL — ABNORMAL LOW (ref 4.22–5.81)
RDW: 19.6 % — AB (ref 11.5–15.5)
WBC: 19.5 10*3/uL — AB (ref 4.0–10.5)
nRBC: 0.5 % — ABNORMAL HIGH (ref 0.0–0.2)

## 2018-04-23 SURGERY — LEFT HEART CATH AND CORONARY ANGIOGRAPHY
Anesthesia: LOCAL

## 2018-04-23 MED ORDER — LIDOCAINE HCL (PF) 1 % IJ SOLN
INTRAMUSCULAR | Status: AC
  Filled 2018-04-23: qty 30

## 2018-04-23 MED ORDER — SODIUM CHLORIDE 0.9% FLUSH
3.0000 mL | Freq: Two times a day (BID) | INTRAVENOUS | Status: DC
  Administered 2018-04-23 – 2018-05-01 (×10): 3 mL via INTRAVENOUS

## 2018-04-23 MED ORDER — SODIUM CHLORIDE 0.9 % IV SOLN: 250.0000 mL | INTRAVENOUS | Status: DC | PRN

## 2018-04-23 MED ORDER — ACETAMINOPHEN 325 MG PO TABS
650.0000 mg | ORAL_TABLET | ORAL | Status: DC | PRN
  Administered 2018-04-23 – 2018-05-05 (×4): 650 mg via ORAL
  Filled 2018-04-23 (×5): qty 2

## 2018-04-23 MED ORDER — SODIUM CHLORIDE 0.9% FLUSH: 3.0000 mL | INTRAVENOUS | Status: DC | PRN

## 2018-04-23 MED ORDER — HEPARIN (PORCINE) IN NACL 1000-0.9 UT/500ML-% IV SOLN
INTRAVENOUS | Status: AC
  Filled 2018-04-23: qty 1000

## 2018-04-23 MED ORDER — CLOPIDOGREL BISULFATE 75 MG PO TABS
75.0000 mg | ORAL_TABLET | Freq: Every day | ORAL | Status: DC
  Administered 2018-04-24 – 2018-04-25 (×2): 75 mg via ORAL
  Filled 2018-04-23 (×3): qty 1

## 2018-04-23 MED ORDER — IOHEXOL 350 MG/ML SOLN
INTRAVENOUS | Status: DC | PRN
  Administered 2018-04-23: 85 mL via INTRA_ARTERIAL

## 2018-04-23 MED ORDER — CLOPIDOGREL BISULFATE 300 MG PO TABS
300.0000 mg | ORAL_TABLET | Freq: Once | ORAL | Status: AC
  Administered 2018-04-23: 300 mg via ORAL
  Filled 2018-04-23: qty 1

## 2018-04-23 MED ORDER — LIDOCAINE HCL (PF) 1 % IJ SOLN
INTRAMUSCULAR | Status: DC | PRN
  Administered 2018-04-23: 20 mL via SUBCUTANEOUS

## 2018-04-23 MED ORDER — FENTANYL CITRATE (PF) 100 MCG/2ML IJ SOLN
INTRAMUSCULAR | Status: DC | PRN
  Administered 2018-04-23: 12.5 ug via INTRAVENOUS

## 2018-04-23 MED ORDER — MIDAZOLAM HCL 2 MG/2ML IJ SOLN
INTRAMUSCULAR | Status: AC
  Filled 2018-04-23: qty 2

## 2018-04-23 MED ORDER — HEPARIN (PORCINE) IN NACL 1000-0.9 UT/500ML-% IV SOLN
INTRAVENOUS | Status: DC | PRN
  Administered 2018-04-23 (×2): 500 mL

## 2018-04-23 MED ORDER — MIDAZOLAM HCL 2 MG/2ML IJ SOLN
INTRAMUSCULAR | Status: DC | PRN
  Administered 2018-04-23: 0.5 mg via INTRAVENOUS

## 2018-04-23 MED ORDER — HEPARIN (PORCINE) IN NACL 100-0.45 UNIT/ML-% IJ SOLN
1350.0000 [IU]/h | INTRAMUSCULAR | Status: DC
  Administered 2018-04-23: 1000 [IU]/h via INTRAVENOUS
  Administered 2018-04-25: 1350 [IU]/h via INTRAVENOUS
  Filled 2018-04-23 (×3): qty 250

## 2018-04-23 MED ORDER — FENTANYL CITRATE (PF) 100 MCG/2ML IJ SOLN
INTRAMUSCULAR | Status: AC
  Filled 2018-04-23: qty 2

## 2018-04-23 SURGICAL SUPPLY — 9 items
CATH INFINITI 5FR MULTPACK ANG (CATHETERS) ×1 IMPLANT
KIT HEART LEFT (KITS) ×2 IMPLANT
KIT MICROPUNCTURE NIT STIFF (SHEATH) ×1 IMPLANT
PACK CARDIAC CATHETERIZATION (CUSTOM PROCEDURE TRAY) ×2 IMPLANT
SHEATH PINNACLE 5F 10CM (SHEATH) ×1 IMPLANT
SYR MEDRAD MARK V 150ML (SYRINGE) ×2 IMPLANT
TRANSDUCER W/STOPCOCK (MISCELLANEOUS) ×2 IMPLANT
TUBING CIL FLEX 10 FLL-RA (TUBING) ×2 IMPLANT
WIRE EMERALD 3MM-J .035X150CM (WIRE) ×1 IMPLANT

## 2018-04-23 NOTE — Interval H&P Note (Signed)
History and Physical Interval Note:  04/23/2018 8:45 AM  Troy [name]  has presented today for surgery, with the diagnosis of NSTEMI. The various methods of treatment have been discussed with the patient and family. After consideration of risks, benefits and other options for treatment, the patient has consented to  Procedure(s): LEFT HEART CATH AND CORONARY ANGIOGRAPHY (N/A) with possible PERCUTANEOUS CORONARY INTERVENTION  as a surgical intervention .  The patient's history has been reviewed, patient examined, no change in status, stable for surgery.  I have reviewed the patient's chart and labs.  Questions were answered to the patient's satisfaction.    Cath Lab Visit (complete for each Cath Lab visit)  Clinical Evaluation Leading to the Procedure:   ACS: Yes.    Non-ACS:    Anginal Classification: CCS IV  Anti-ischemic medical therapy: Minimal Therapy (1 class of medications)  Non-Invasive Test Results: No non-invasive testing performed  Prior CABG: No previous CABG    [name]

## 2018-04-23 NOTE — Progress Notes (Addendum)
Site area: RFA Site Prior to Removal:  Level 0 Pressure Applied For: 25 min Manual:   yes Patient Status During Pull:   Post Pull Site:  Level  stable Post Pull Instructions Given: yes  Post Pull Pulses Present: doppler Dressing Applied:  clear Bedrest begins @ 1025 till 1425 Comments:

## 2018-04-23 NOTE — Progress Notes (Signed)
Received report from off-going nurse.  Pt is post cath and in HD.

## 2018-04-23 NOTE — Progress Notes (Signed)
CSW reviewed bed offers with daughter, [name], on the phone. [name] stated that patient is wanting to go home, but [name] feels patient should go to rehab. She is looking into the facility options before making a choice. [name] to discuss with patient this evening when she comes to visit. She attempted to visit earlier today, but patient out for cath and then for HD.   CSW met with patient at bedside in HD briefly. Updated patient on discussions with daughter.  CSW did explain that patient is now clipped and facility will need to be chosen so insurance auth can be started. CSW to follow for facility choice and support with discharge planning.   Estanislado Emms, [place]

## 2018-04-23 NOTE — Progress Notes (Signed)
PROGRESS NOTE  BERNELL SIGAL  IDP:[id number] DOB: 03-Sep-1940 DOA: 04/18/2018 PCP: Gaynelle Arabian, MD  Brief Narrative: Troy [name] is a 77 y.o. 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 Also noted to have progressive renal failure with uremia for which nephrology was consulted and began hemodialysis 10/11 through remotely placed left RC AVF. An outpatient HD chair has been arranged.   Echocardiogram demonstrated worsening of LV dysfunction (EF 45-50% 2017 now 25-30%). Cardiology was consulted, recommending heart catheterization which the patient and family consented to after discussions with palliative care consultants. Left heart catheterization on 10/16 after vitamin K given for supratherapeutic INR. Results are pending.   Assessment & Plan: Principal Problem:   Acute on chronic systolic heart failure (HCC) Active Problems:   CKD (chronic kidney disease), stage V (HCC)   ESRD (end stage renal disease) (HCC)   Leukocytosis   Atypical chest pain   Paroxysmal A-fib (HCC)   NSTEMI (non-ST elevated myocardial infarction) [facility])   Myocardial infarction [facility])   Palliative care by specialist   DNR (do not resuscitate) discussion  NSTEMI: With hx abnormal stress testing Jan 2018 not followed by LHC due to CKD.  - LHC 04/23/2018 results pending. Plan per cardiology.  - Continue medical management as blood pressure will tolerate, currently on metoprolol, isosorbide. Also on ASA, statin  Chronic combined HFrEF: EF worsened to 25-30%.  - Manage volume with HD.  - Otherwise on guideline directed therapy as BP allows.    Sinus bradycardia: Pt on low dose metoprolol, does not seem to be symptomatic.  - Would recommend holding beta blocker pending further cardiology recommendations.   ESRD: CLIP process completed, has spot at [facility] MWF at 12:15pm.  - Per nephrology, dialyzing following contrast with LHC today.  Acute toxic metabolic encephalopathy: Due to uremia. Resolving.   Paroxysmal AFib:  - Continue amiodarone and metoprolol. - Await recommendation Re: restarting coumadin per cardiology.   Supratherapeutic INR: Resolved s/p vit K. - Appreciate pharmacy assistance in dosing.   PVD, carotid stenosis: s/p CEA 2011.  - Continue ASA, statin  Anemia of CKD and acute blood loss: s/p 1u PRBCs 10/11, 2u 10/12  in setting of AVF bleeding and supratherapeutic INR.  - Monitor CBC - EPO per nephrology  Hypothyroidism: Chronic, stable.  - Continue synthroid 42mcg  Leukocytosis: Unclear etiology, possibly purely reactive in the absence of fever, localizing evidence of infection.  - Monitor CBC and blood cultures (NGTD) off antibiotics.   DVT prophylaxis: Coumadin Code Status: Full Family Communication: None at bedside Disposition Plan: Uncertain what cardiology plan is, will follow up recommendations. PT/OT also ordered. CLIP process completed. 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   Consultants:   Cardiology  Nephrology  Procedures:   HD   LHC 04/23/2018: Pending  Antimicrobials:  None   Subjective: No chest pain or dyspnea. Gets winded after about 10-15 feet, walks very slowly with rolling walker at home to help care for his wife. Denies swelling, PND, orthopnea, palpitations or bleeding.   Objective: Vitals:   04/23/18 1202 04/23/18 1230 04/23/18 1300 04/23/18 1330  BP: (!) 103/45 Marland Kitchen)  103/48 (!) 100/46 (!) 94/45  Pulse: (!) 46 (!) 46 (!) 47 (!) 47  Resp: 18 19    Temp:      TempSrc:      SpO2:      Weight:      Height:        Intake/Output Summary (Last 24  hours) at 04/23/2018 1425 Last data filed at 04/23/2018 1100 Gross per 24 hour  Intake 725.06 ml  Output -  Net 725.06 ml   Filed Weights   04/22/18 0441 04/23/18 0416 04/23/18 1150  Weight: 79.2 kg 77.4 kg 78 kg    Gen: Elderly male in no distress  Pulm: Non-labored breathing. Clear to auscultation bilaterally.  CV: Regular bradycardia. No murmur, rub, or gallop. No JVD, 2+ pedal edema. GI: Abdomen soft, non-tender, non-distended, with normoactive bowel sounds. No organomegaly or masses felt. Ext: Warm, no deformities. Left RC AVF +thrill. No active bleeding. Skin: No rashes, lesions no ulcers Neuro: Alert and oriented. No focal neurological deficits. Psych: Judgement and insight appear normal. Mood & affect appropriate.   Data Reviewed: I have personally reviewed following labs and imaging studies  CBC: Recent Labs  Lab 04/19/18 0625 04/20/18 0915 04/21/18 0403 04/22/18 0615 04/23/18 0339  WBC 15.4* 20.4* 20.6* 17.3* 19.5*  NEUTROABS  --   --   --  15.7*  --   HGB 7.6* 9.9* 9.8* 10.9* 10.1*  HCT 24.5* 30.9* 31.4* 34.0* 32.2*  MCV 96.8 97.5 96.6 95.5 97.3  PLT 251 220 253 226 836   Basic Metabolic Panel: Recent Labs  Lab 04/18/18 1200 04/19/18 0625 04/20/18 0915 04/21/18 0403 04/23/18 0339  NA 138 136 136 134* 132*  K 4.2 3.6 3.6 3.8 4.0  CL 100 99 100 97* 97*  CO2 14* 19* 21* 21* 21*  GLUCOSE 137* 118* 115* 116* 113*  BUN 186* 125* 77* 94* 78*  CREATININE 9.51* 7.72* 6.32* 7.69* 7.46*  CALCIUM 8.4* 7.8* 7.8* 7.6* 7.5*   GFR: Estimated Creatinine Clearance: 8.2 mL/min (A) (by C-G formula based on SCr of 7.46 mg/dL (H)). Liver Function Tests: No results for input(s): AST, ALT, ALKPHOS, BILITOT, PROT, ALBUMIN in the last 168 hours. No results for input(s): LIPASE, AMYLASE in the last 168 hours. No results for input(s): AMMONIA in the last 168 hours. Coagulation Profile: Recent Labs  Lab 04/18/18 1200 04/19/18 0625 04/20/18 0915 04/20/18 1229  04/21/18 0403  INR 4.56* 4.10* 2.21 1.96 1.43   Cardiac Enzymes: Recent Labs  Lab 04/18/18 2342 04/19/18 0625 04/19/18 1216  TROPONINI 8.18* 7.96* 7.93*   BNP (last 3 results) No results for input(s): PROBNP in the last 8760 hours. HbA1C: No results for input(s): HGBA1C in the last 72 hours. CBG: No results for input(s): GLUCAP in the last 168 hours. Lipid Profile: No results for input(s): CHOL, HDL, LDLCALC, TRIG, CHOLHDL, LDLDIRECT in the last 72 hours. Thyroid Function Tests: No results for input(s): TSH, T4TOTAL, FREET4, T3FREE, THYROIDAB in the last 72 hours. Anemia Panel: No results for input(s): VITAMINB12, FOLATE, FERRITIN, TIBC, IRON, RETICCTPCT in the last 72 hours. Urine analysis:    Component Value Date/Time   COLORURINE YELLOW 06/05/2016 0328   APPEARANCEUR CLEAR 06/05/2016 0328   LABSPEC 1.010 06/05/2016 0328   PHURINE 5.0 06/05/2016 0328   GLUCOSEU NEGATIVE 06/05/2016 0328   HGBUR TRACE (A) 06/05/2016 0328   BILIRUBINUR NEGATIVE 06/05/2016 0328   KETONESUR NEGATIVE 06/05/2016 0328   PROTEINUR NEGATIVE 06/05/2016 0328   UROBILINOGEN 0.2 02/15/2010 0845   NITRITE  NEGATIVE 06/05/2016 0328   LEUKOCYTESUR NEGATIVE 06/05/2016 0328   Recent Results (from the past 240 hour(s))  MRSA PCR Screening     Status: None   Collection Time: 04/18/18 10:00 PM  Result Value Ref Range Status   MRSA by PCR NEGATIVE NEGATIVE Final    Comment:        The GeneXpert MRSA Assay (FDA approved for NASAL specimens only), is one component of a comprehensive MRSA colonization surveillance program. It is not intended to diagnose MRSA infection nor to guide or monitor treatment for MRSA infections. Performed at [facility], [place] [address], [place], [place] [address]   Culture, blood (Routine X 2) w Reflex to ID Panel     Status: None (Preliminary result)   Collection Time: 04/21/18 12:08 PM  Result Value Ref Range Status   Specimen Description BLOOD RIGHT HAND  Final    Special Requests   Final    BOTTLES DRAWN AEROBIC ONLY Blood Culture adequate volume   Culture   Final    NO GROWTH 2 DAYS Performed at [facility], [place] [address], [place], [place] [address]    Report Status PENDING  Incomplete  Culture, blood (Routine X 2) w Reflex to ID Panel     Status: None (Preliminary result)   Collection Time: 04/21/18 12:12 PM  Result Value Ref Range Status   Specimen Description BLOOD RIGHT HAND  Final   Special Requests   Final    BOTTLES DRAWN AEROBIC ONLY Blood Culture adequate volume   Culture   Final    NO GROWTH 2 DAYS Performed at [facility], [place] [address] [place], [place] [address]    Report Status PENDING  Incomplete      Radiology Studies: No results found.  Scheduled Meds: . sodium chloride   Intravenous Once  . sodium chloride   Intravenous Once  . amiodarone  200 mg Oral Daily  . aspirin EC  81 mg Oral Daily  . atorvastatin  20 mg Oral QHS  . Chlorhexidine Gluconate Cloth  6 each Topical Q0600  . [START ON 04/25/2018] Darbepoetin Alfa  200 mcg Intravenous Q Fri-HD  . feeding supplement  1 Container Oral BID BM  . feeding supplement (PRO-STAT SUGAR FREE 64)  30 mL Oral Daily  . levothyroxine  50 mcg Oral QAC breakfast  . lidocaine-prilocaine   Topical Q M,W,F  . metoprolol tartrate  12.5 mg Oral BID  . multivitamin  1 tablet Oral QHS  . sodium chloride flush  3 mL Intravenous Q12H   Continuous Infusions: . sodium chloride    . sodium chloride    . dextrose Stopped (04/23/18 1100)  . ferric gluconate (FERRLECIT/NULECIT) IV    . heparin       LOS: 5 days   Time spent: 25 minutes.  Patrecia Pour, MD [facility] [email or web address] Password [email or web address] 04/23/2018, 2:25 PM

## 2018-04-23 NOTE — Progress Notes (Signed)
Received report from HD nurse.  Reports cath site clean dry without bleeding/hematoma.  Awaiting patients return.

## 2018-04-23 NOTE — Progress Notes (Signed)
    Brief Interventional Cardiology Note, Cath Follow-Up  I reviewed these images with Dr. Martin[place] and Dr. Irish Lack. If we were going to do both vessels, this would definitely require Impella support which would mean large-bore access and increased bleeding risk.  The RCA will be very difficult vessel to intervene upon and may not be feasible target in this relatively disabled gentleman.   At this point, the consensus between myself and Dr. Irish Lack is that with the patient is limited baseline functional capacity, perhaps the safest option would be to plan to treat the patient's LAD which is the easier of the 2 lesions and would likely be the most beneficial for him from a EF and morbidity/mortality standpoint.  Potentially this could be done without hemodynamic support which would avoid large-bore access.  The plan would then be to monitor him medically and treat the essentially subtotally occluded RCA medically for now and only bring him back for staged PCI if symptoms worsen.  He is on the schedule for Friday morning with Dr. Irish Lack.  He has discussed the case with Dr. Stanford Breed.  We will go ahead and load patient with 300 mg Plavix tonight and 75 mg daily going forward.  This will allow him to use heparin for anticoagulation during PCI.   [name], MD

## 2018-04-23 NOTE — Progress Notes (Signed)
Accepted at [facility] [address] .1st treatment Friday,October 18,2019 at 12:15.Schedule Monday,Wednesday,Friday at 12:15 pm

## 2018-04-23 NOTE — Progress Notes (Signed)
Pt leaving the unit to cath lab. Janiene Aarons. BSN, RN

## 2018-04-23 NOTE — Care Management Important Message (Signed)
Important Message  Patient Details  Name: Troy [name] MRN: [id number] Date of Birth: [birth year]   Medicare Important Message Given:  Yes    Osric Klopf P Meryl Ponder 04/23/2018, 1:04 PM

## 2018-04-23 NOTE — Procedures (Signed)
I was present at this session.  I have reviewed the session itself and made appropriate changes.  HD via, LLA AVF,small needles. bp low 100s. tol 2.5 L goal  Jeneen Rinks Kortnee Bas 10/16/20192:16 PM

## 2018-04-23 NOTE — Progress Notes (Signed)
Nutrition Follow-up / Consult  DOCUMENTATION CODES:   Not applicable  INTERVENTION:    Continue Boost Breeze PO TID, each supplement provides 250 kcal and 9 grams of protein  Continue Pro-stat 30 ml PO once daily  NUTRITION DIAGNOSIS:   Increased nutrient needs related to acute illness, chronic illness as evidenced by estimated needs.  Ongoing  GOAL:   Patient will meet greater than or equal to 90% of their needs  Progressing  MONITOR:   PO intake, Supplement acceptance, Weight trends, Labs, I & O's  REASON FOR ASSESSMENT:   Consult Assessment of nutrition requirement/status, Diet education  ASSESSMENT:   77 y.o. 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 Patient started on HD on 10/11.  Received MD consult for diet education and assessment of nutrition status. Initial RD assessment completed 10/12. Patient is being offered Breeze TID and Pro-stat once daily. He did not drink the [place] he was given this morning. S/P cardiac cath this morning, now in HD. Patient is new to HD this admission. Unable to provide diet education at this time as patient is not in his room. RD left Dialysis Pyramid handout in patient's room. RD to follow-up at a later time/date for education.   S/P cardiac cath this morning. Per discussion with RN, patient may need cardiac intervention in the near future.   PO intake has been variable. Patient has been consuming 0-100% of meals.  Labs reviewed. Sodium 132 (L), BUN 78 (H), creatinine 7.46 (H) Medications reviewed and include Rena-vit, Nulecit.  I/O +6 L  since admission  Diet Order:   Diet Order            Diet Heart Room service appropriate? Yes; Fluid consistency: Thin  Diet effective now              EDUCATION NEEDS:   Not appropriate for education at this time  Skin:  Skin Assessment: Reviewed RN Assessment  Last BM:  10/16 (type 5)  Height:   Ht Readings from Last 1 Encounters:  04/18/18 5\' 6"  (1.676 m)    Weight:   Wt Readings from Last 1 Encounters:  04/23/18 77.4 kg    Ideal Body Weight:  64.54 kg  BMI:  Body mass index is 27.55 kg/m.  Estimated Nutritional Needs:   Kcal:  2100-2300  Protein:  95-105 grams  Fluid:  UOP +1 L    Molli Barrows, RD, LDN, [place] Pager [phone] After Hours Pager [phone]
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
## 2018-04-23 NOTE — Progress Notes (Signed)
Subjective: Interval History: has complaints not sure what he is to do.  Objective: Vital signs in last 24 hours: Temp:  [97.9 F (36.6 C)-99.3 F (37.4 C)] 97.9 F (36.6 C) (10/16 0745) Pulse Rate:  [30-54] 44 (10/16 1101) Resp:  [14-23] 14 (10/16 1101) BP: (80-113)/(34-62) 101/45 (10/16 1101) SpO2:  [93 %-100 %] 99 % (10/16 1101) Weight:  [77.4 kg] 77.4 kg (10/16 0416) Weight change: -2.771 kg  Intake/Output from previous day: 10/15 0701 - 10/16 0700 In: 1189.9 [P.O.:580; I.V.:609.9] Out: -  Intake/Output this shift: No intake/output data recorded.  General appearance: cooperative, no distress, moderately obese and slowed mentation Resp: rhonchi bilaterally and wheezes bilaterally Cardio: S1, S2 normal and systolic murmur: systolic ejection 2/6, crescendo and decrescendo at 2nd left intercostal space GI: obese, pos bs, liver down 4 cm Extremities: edema 2-3+  Lab Results: Recent Labs    04/22/18 0615 04/23/18 0339  WBC 17.3* 19.5*  HGB 10.9* 10.1*  HCT 34.0* 32.2*  PLT 226 221   BMET:  Recent Labs    04/21/18 0403 04/23/18 0339  NA 134* 132*  K 3.8 4.0  CL 97* 97*  CO2 21* 21*  GLUCOSE 116* 113*  BUN 94* 78*  CREATININE 7.69* 7.46*  CALCIUM 7.6* 7.5*   No results for input(s): PTH in the last 72 hours. Iron Studies: No results for input(s): IRON, TIBC, TRANSFERRIN, FERRITIN in the last 72 hours.  Studies/Results: No results found.  I have reviewed the patient's current medications.  Assessment/Plan: 1 ESRD for HD,  Vol xs, slowly lower bp limiting 2 Anemia esa 3 HPTH vit D 4 Low bps, will need to stop lopressor soon 5 PAF on Amio 6 CAD ??intervention 7 ^ WBC P HD, esa, lower vol.     LOS: 5 days   Jeneen Rinks Aric Jost 04/23/2018,11:37 AM

## 2018-04-24 ENCOUNTER — Inpatient Hospital Stay (HOSPITAL_COMMUNITY): Payer: Medicare Other

## 2018-04-24 LAB — RENAL FUNCTION PANEL
Albumin: 2.3 g/dL — ABNORMAL LOW (ref 3.5–5.0)
Anion gap: 13 (ref 5–15)
BUN: 40 mg/dL — ABNORMAL HIGH (ref 8–23)
CALCIUM: 7.3 mg/dL — AB (ref 8.9–10.3)
CHLORIDE: 97 mmol/L — AB (ref 98–111)
CO2: 24 mmol/L (ref 22–32)
Creatinine, Ser: 5.25 mg/dL — ABNORMAL HIGH (ref 0.61–1.24)
GFR calc non Af Amer: 9 mL/min — ABNORMAL LOW (ref >60)
GFR, EST AFRICAN AMERICAN: 11 mL/min — AB (ref >60)
Glucose, Bld: 85 mg/dL (ref 70–99)
POTASSIUM: 3.9 mmol/L (ref 3.5–5.1)
Phosphorus: 6.3 mg/dL — ABNORMAL HIGH (ref 2.5–4.6)
Sodium: 134 mmol/L — ABNORMAL LOW (ref 135–145)

## 2018-04-24 LAB — CBC
HEMATOCRIT: 32.3 % — AB (ref 39.0–52.0)
HEMOGLOBIN: 10.3 g/dL — AB (ref 13.0–17.0)
MCH: 31.1 pg (ref 26.0–34.0)
MCHC: 31.9 g/dL (ref 30.0–36.0)
MCV: 97.6 fL (ref 80.0–100.0)
Platelets: 227 10*3/uL (ref 150–400)
RBC: 3.31 MIL/uL — AB (ref 4.22–5.81)
RDW: 19.9 % — ABNORMAL HIGH (ref 11.5–15.5)
WBC: 30.6 10*3/uL — ABNORMAL HIGH (ref 4.0–10.5)
nRBC: 0.2 % (ref 0.0–0.2)

## 2018-04-24 LAB — HEPARIN LEVEL (UNFRACTIONATED)
HEPARIN UNFRACTIONATED: 0.22 [IU]/mL — AB (ref 0.30–0.70)
Heparin Unfractionated: 0.17 IU/mL — ABNORMAL LOW (ref 0.30–0.70)
Heparin Unfractionated: 0.3 IU/mL (ref 0.30–0.70)

## 2018-04-24 IMAGING — DX DG CHEST 2V
2 series · 2 of 2 positions shown · non-contrast
Comparison: [DATE].

CLINICAL DATA: CHF.

EXAM:
CHEST - 2 VIEW

[chest ap]
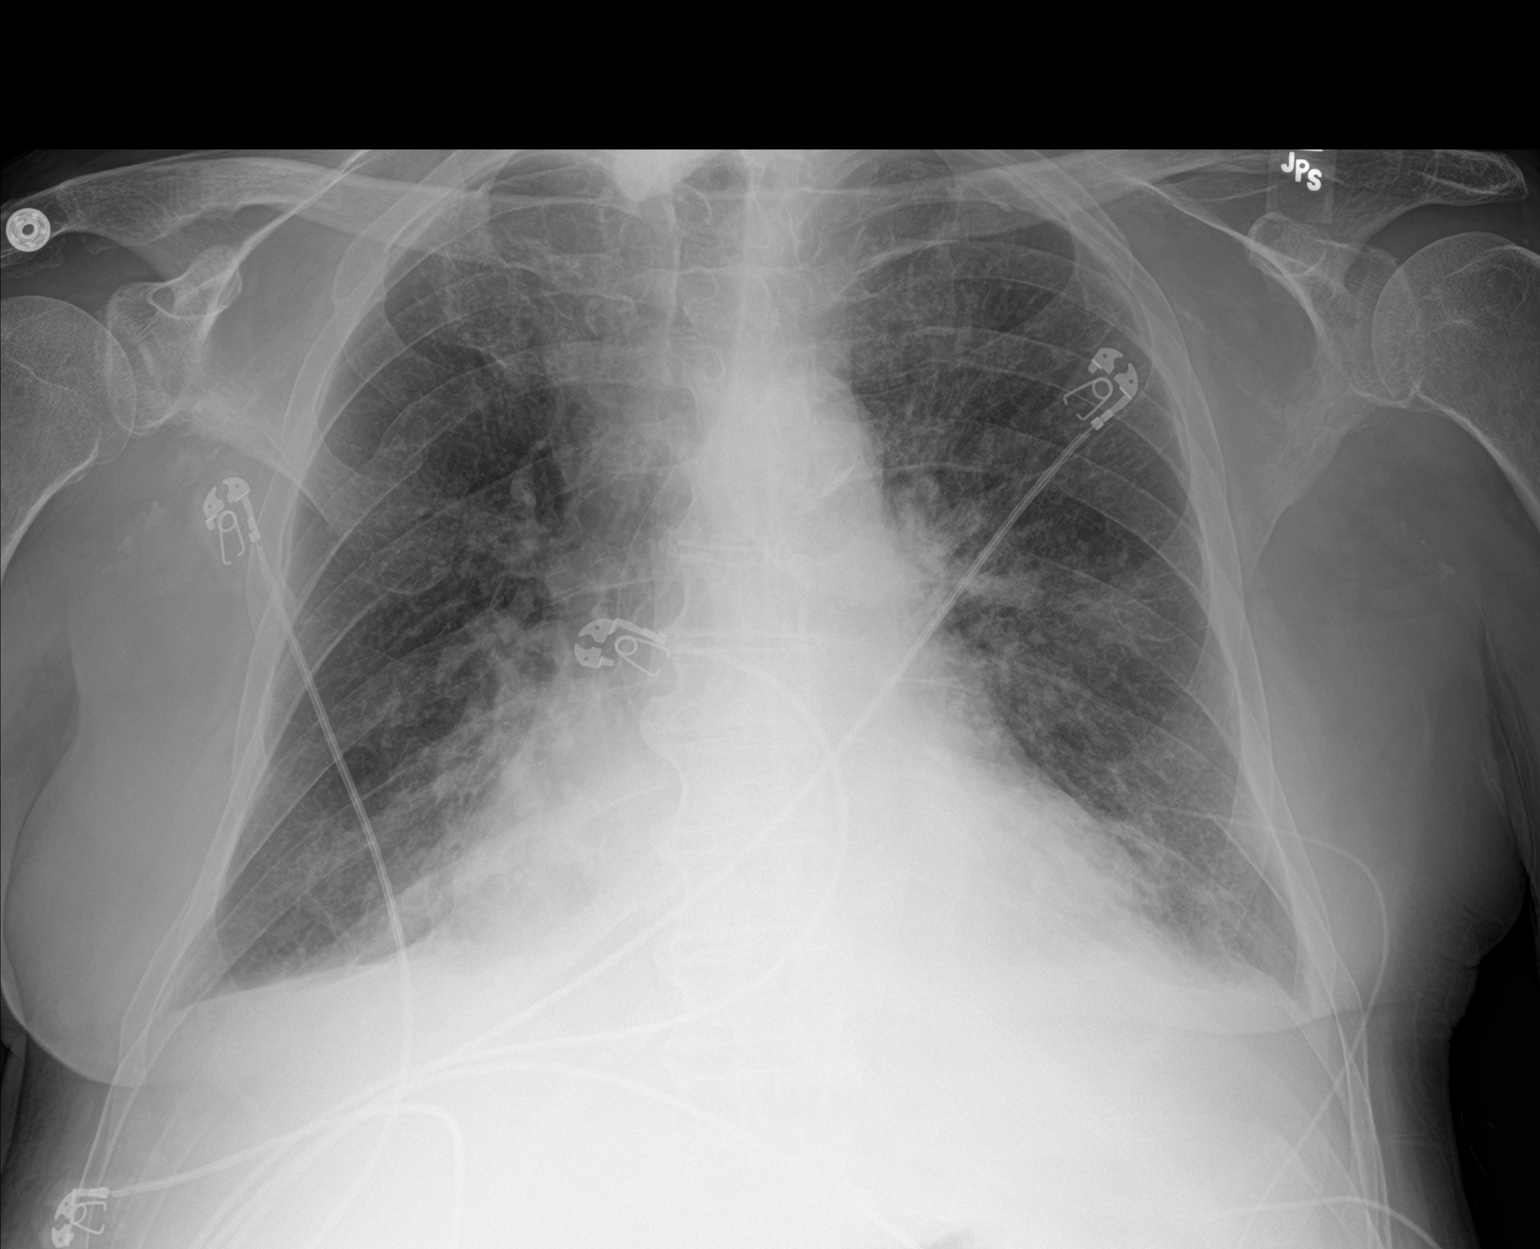

[chest lat]
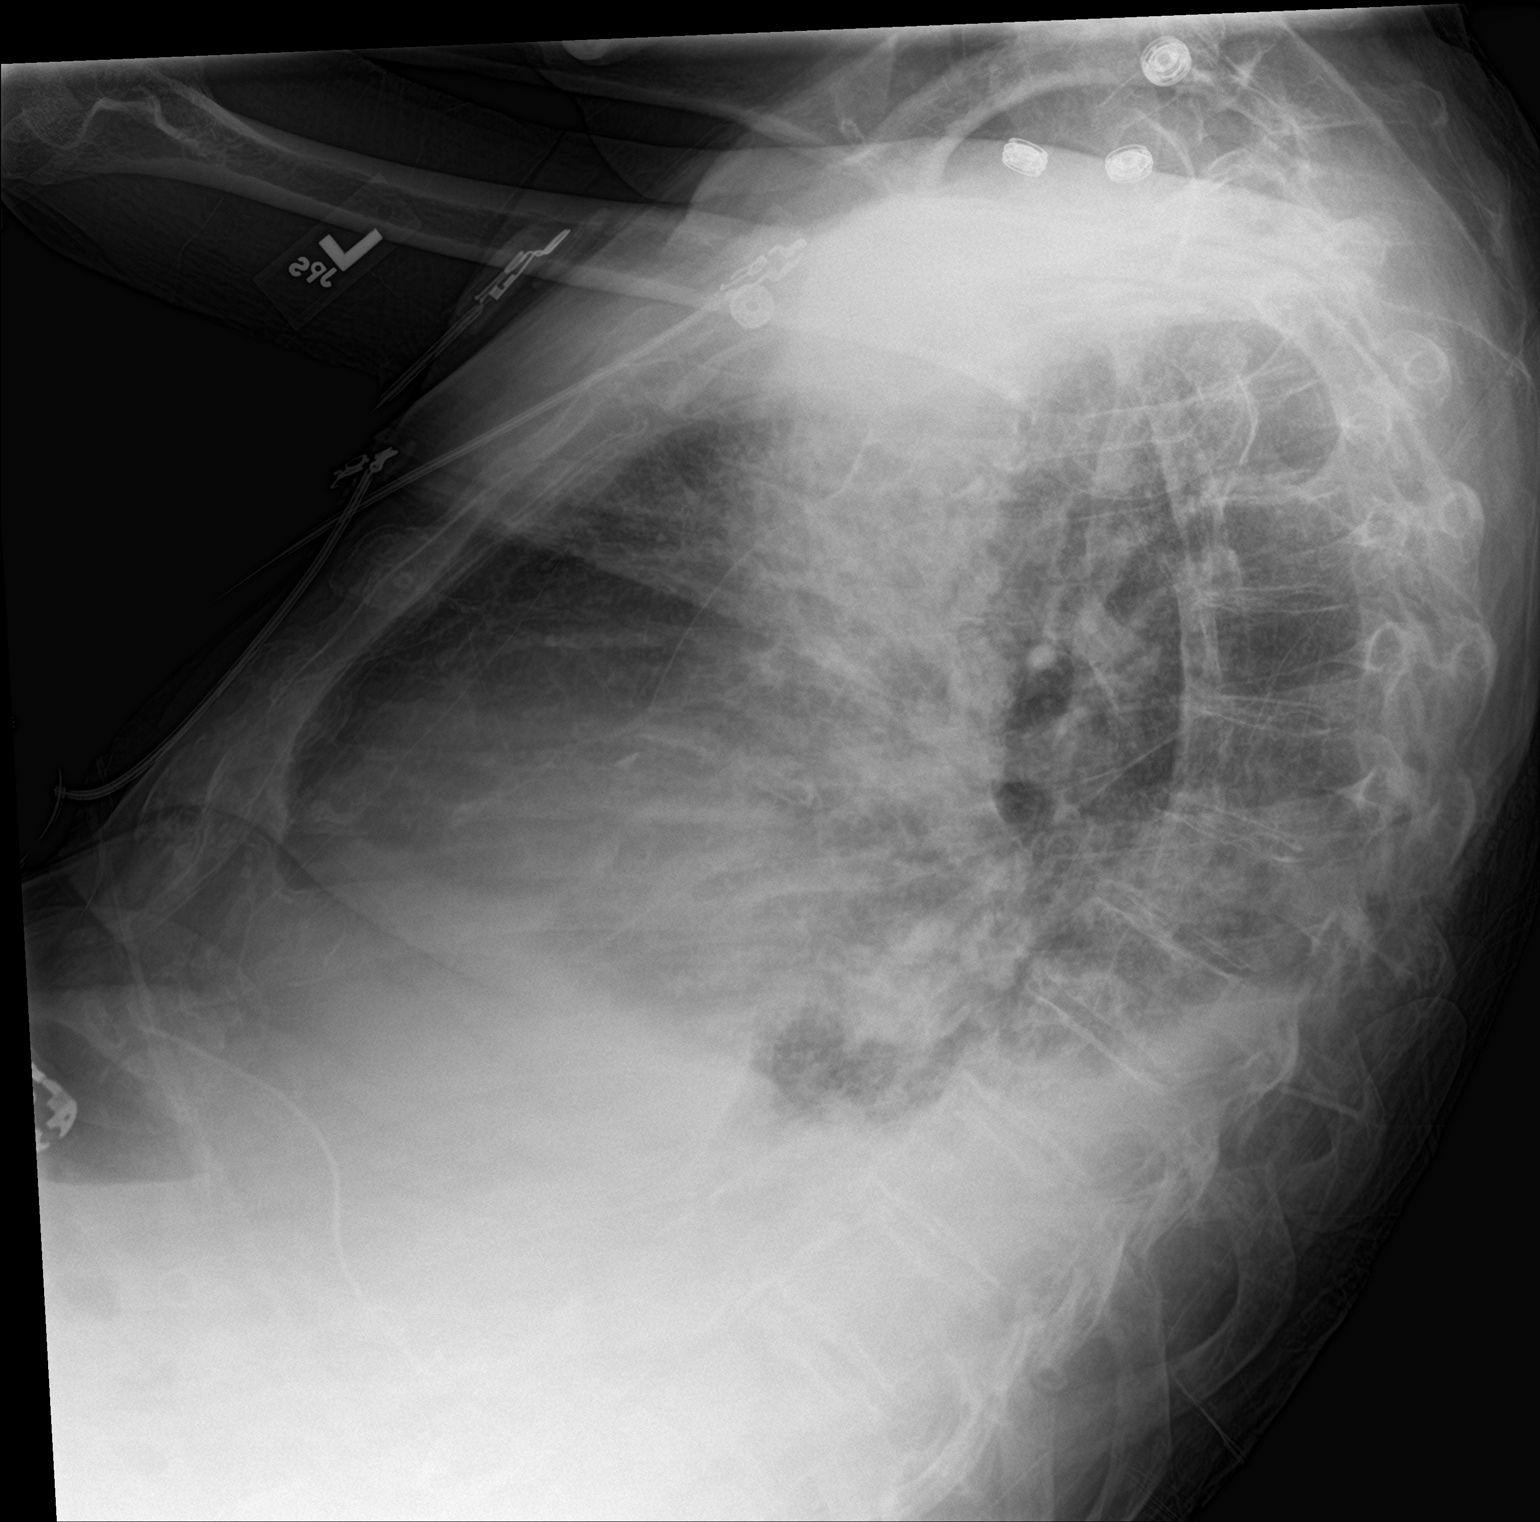

[2 of 2 positions shown; findings below may reference images not displayed]

FINDINGS: Cardiomegaly with diffuse bilateral interstitial prominence and
bilateral pleural effusions suggesting CHF. Bibasilar atelectasis.
Degenerative change thoracic spine. Old healed right fifth rib
fracture.
IMPRESSION: Cardiomegaly with diffuse bilateral from interstitial prominence and
bilateral pleural effusions suggesting CHF. Similar findings noted
on prior exam.

## 2018-04-24 MED ORDER — VANCOMYCIN HCL IN DEXTROSE 750-5 MG/150ML-% IV SOLN
750.0000 mg | INTRAVENOUS | Status: DC
  Filled 2018-04-24: qty 150

## 2018-04-24 MED ORDER — CHLORHEXIDINE GLUCONATE CLOTH 2 % EX PADS
6.0000 | MEDICATED_PAD | Freq: Every day | CUTANEOUS | Status: DC
  Administered 2018-04-24 – 2018-04-29 (×4): 6 via TOPICAL

## 2018-04-24 MED ORDER — VANCOMYCIN HCL 10 G IV SOLR
1250.0000 mg | Freq: Once | INTRAVENOUS | Status: AC
  Administered 2018-04-24: 1250 mg via INTRAVENOUS
  Filled 2018-04-24: qty 1250

## 2018-04-24 MED ORDER — DEXTROSE 5 % IV SOLN
0.5000 g | Freq: Two times a day (BID) | INTRAVENOUS | Status: DC
  Administered 2018-04-24 – 2018-04-29 (×11): 0.5 g via INTRAVENOUS
  Filled 2018-04-24 (×14): qty 0.5

## 2018-04-24 NOTE — Progress Notes (Addendum)
PROGRESS NOTE  Troy [name]  EXH:[id number] DOB: 01/13/1941 DOA: 04/18/2018 PCP: Gaynelle Arabian, MD  Brief Narrative: Troy [name] is a 77 y.o. 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 Also noted to have progressive renal failure with uremia for which nephrology was consulted and began hemodialysis 10/11 through remotely placed left RC AVF. An outpatient HD chair has been arranged.   Echocardiogram demonstrated worsening of LV dysfunction (EF 45-50% 2017 now 25-30%). Cardiology was consulted, recommending heart catheterization which the patient and family consented to after discussions with palliative care consultants. Left heart catheterization on 10/16 after vitamin K given for supratherapeutic INR showed LAD and RCA disease, though only LAD lesion was felt to be amenable to stenting. This is planned 10/18. On 10/17 he developed erythema proximal to fistula site for which vancomycin and aztreonam were started empirically.   Assessment & Plan: Principal Problem:   Acute on chronic systolic heart failure (HCC) Active Problems:   CKD (chronic kidney disease), stage V (HCC)   ESRD (end stage renal disease) (HCC)   Leukocytosis   Atypical chest pain   Paroxysmal A-fib (HCC)   NSTEMI (non-ST elevated myocardial infarction) [facility])   Myocardial infarction [facility])   Palliative care by specialist   DNR (do not resuscitate) discussion  NSTEMI: With hx abnormal stress testing Jan 2018 not followed by LHC due to CKD.  - LHC 04/23/2018 showed LAD and RCA lesions, plan for PCI to LAD 10/18 with further plan based on clinical  course (aggressive medical management vs. staged PCI to RCA). - Pt on heparin gtt, loaded with plavix, has continued DAPT, statin, and antihypertensives/antianginals as BP will tolerate.    Chronic combined HFrEF: EF worsened to 25-30%.  - Manage volume with HD.  - Otherwise on guideline directed therapy as BP allows.   Sinus bradycardia: Pt on low dose metoprolol, does not seem to be symptomatic.  - Hold beta blocker.  ESRD: CLIP process completed, has spot at [facility] MWF at 12:15pm.  - Dialysis per nephrology.  Acute toxic metabolic encephalopathy: Due to uremia. Resolved  Paroxysmal AFib:  - Continue amiodarone. Currently bradycardic so holding BB. - On heparin gtt, holding coumadin for now.  Supratherapeutic INR: Resolved s/p vit K. - Appreciate pharmacy assistance in dosing.   PVD, carotid stenosis: s/p CEA 2011.  - Continue ASA, statin  Anemia of CKD and acute blood loss: s/p 1u PRBCs 10/11, 2u 10/12  in setting of AVF bleeding and supratherapeutic INR.  - Monitor CBC, stable. - EPO per nephrology  Hypothyroidism: Chronic, stable.  - Continue synthroid 44mcg  Leukocytosis, treating as left arm cellulitis: WBC climbed 19 > 30. CXR without focal infiltrate to suggest pneumonia. No UA yet obtained. Feel hypotension is more likely related to medications and not related to sepsis. Tmax is 100.45F.  - Monitor blood cultures - Continue empiric abx, though this could be consistent with bruising and not truly cellulitis. - Consider fistula imaging if fluctuance develops. - Monitor clinical course (picture taken this morning below)  ADDENDUM: RN reports pt has had 3 liquid, watery bowel movements today (in 12 hours). No abdominal pain.  Tmax remains 100.10F. In the presence of abruptly worsened leukocytosis and unclear whether the left arm erythema is bruising or cellulitis, will check CDiff and put on empiric precautions.    DVT prophylaxis: Heparin Code Status: Full Family  Communication: None at bedside Disposition Plan: PT/OT recommending SNF which is being arranged. Will continue inpatient work up and treatment of CAD and infection.   Consultants:   Cardiology  Nephrology  Procedures:   HD   LHC 04/23/2018  Antimicrobials:  Vancomycin, aztreonam 10/17 >>   Subjective: Feels very mild pain in left arm, no warmth. Did have significant swelling to that arm in the recent past and reported a wound that he thought had healed. Did not feel like he had a fever this morning, in fact feels much better today than previously.   Objective: Vitals:   04/24/18 0529 04/24/18 0640 04/24/18 0800 04/24/18 1707  BP: (!) 116/47  (!) 114/47 (!) 99/55  Pulse:    (!) 46  Resp:   14   Temp:  97.7 F (36.5 C)  98.5 F (36.9 C)  TempSrc:  Oral  Oral  SpO2: 95%  94% 97%  Weight: 77.7 kg     Height:        Intake/Output Summary (Last 24 hours) at 04/24/2018 1713 Last data filed at 04/24/2018 2979 Gross per 24 hour  Intake 2.14 ml  Output -  Net 2.14 ml   Filed Weights   04/23/18 1150 04/23/18 1602 04/24/18 0529  Weight: 78 kg 76 kg 77.7 kg   Gen: 77 y.o. male in no distress Pulm: Nonlabored breathing room air. Clear. CV: Regular bradycardia. No murmur, rub, or gallop. No JVD, 1+ dependent edema. GI: Abdomen soft, non-tender, non-distended, with normoactive bowel sounds.  Ext: Warm, no deformities. + thrill on left AVF. No bleeding noted.  Skin: Well demarcated erythema proximal to left forearm AV fistula without significant induration or fluctuance. Not appreciably warmer than contralateral site. See picture below.  Neuro: Alert and oriented. No focal neurological deficits. Psych: Judgement and insight appear fair. Mood euthymic & affect congruent. Behavior is appropriate.    04/24/2018:    Data Reviewed: I have personally reviewed following labs and imaging studies  CBC: Recent Labs  Lab 04/20/18 0915 04/21/18 0403 04/22/18 0615 04/23/18 0339  04/24/18 0231  WBC 20.4* 20.6* 17.3* 19.5* 30.6*  NEUTROABS  --   --  15.7*  --   --   HGB 9.9* 9.8* 10.9* 10.1* 10.3*  HCT 30.9* 31.4* 34.0* 32.2* 32.3*  MCV 97.5 96.6 95.5 97.3 97.6  PLT 220 253 226 221 892   Basic Metabolic Panel: Recent Labs  Lab 04/19/18 0625 04/20/18 0915 04/21/18 0403 04/23/18 0339 04/24/18 0231  NA 136 136 134* 132* 134*  K 3.6 3.6 3.8 4.0 3.9  CL 99 100 97* 97* 97*  CO2 19* 21* 21* 21* 24  GLUCOSE 118* 115* 116* 113* 85  BUN 125* 77* 94* 78* 40*  CREATININE 7.72* 6.32* 7.69* 7.46* 5.25*  CALCIUM 7.8* 7.8* 7.6* 7.5* 7.3*  PHOS  --   --   --   --  6.3*   GFR: Estimated Creatinine Clearance: 11.6 mL/min (A) (by C-G formula based on SCr of 5.25 mg/dL (H)). Liver Function Tests: Recent Labs  Lab 04/24/18 0231  ALBUMIN 2.3*   No results for input(s): LIPASE, AMYLASE in the last 168 hours. No results for input(s): AMMONIA in the last 168 hours. Coagulation Profile: Recent Labs  Lab 04/18/18 1200 04/19/18 1194  04/20/18 0915 04/20/18 1229 04/21/18 0403  INR 4.56* 4.10* 2.21 1.96 1.43   Cardiac Enzymes: Recent Labs  Lab 04/18/18 2342 04/19/18 0625 04/19/18 1216  TROPONINI 8.18* 7.96* 7.93*   BNP (last 3 results) No results for input(s): PROBNP in the last 8760 hours. HbA1C: No results for input(s): HGBA1C in the last 72 hours. CBG: No results for input(s): GLUCAP in the last 168 hours. Lipid Profile: No results for input(s): CHOL, HDL, LDLCALC, TRIG, CHOLHDL, LDLDIRECT in the last 72 hours. Thyroid Function Tests: No results for input(s): TSH, T4TOTAL, FREET4, T3FREE, THYROIDAB in the last 72 hours. Anemia Panel: No results for input(s): VITAMINB12, FOLATE, FERRITIN, TIBC, IRON, RETICCTPCT in the last 72 hours. Urine analysis:    Component Value Date/Time   COLORURINE YELLOW 06/05/2016 0328   APPEARANCEUR CLEAR 06/05/2016 0328   LABSPEC 1.010 06/05/2016 0328   PHURINE 5.0 06/05/2016 0328   GLUCOSEU NEGATIVE 06/05/2016 0328    HGBUR TRACE (A) 06/05/2016 0328   BILIRUBINUR NEGATIVE 06/05/2016 0328   KETONESUR NEGATIVE 06/05/2016 0328   PROTEINUR NEGATIVE 06/05/2016 0328   UROBILINOGEN 0.2 02/15/2010 0845   NITRITE NEGATIVE 06/05/2016 0328   LEUKOCYTESUR NEGATIVE 06/05/2016 0328   Recent Results (from the past 240 hour(s))  MRSA PCR Screening     Status: None   Collection Time: 04/18/18 10:00 PM  Result Value Ref Range Status   MRSA by PCR NEGATIVE NEGATIVE Final    Comment:        The GeneXpert MRSA Assay (FDA approved for NASAL specimens only), is one component of a comprehensive MRSA colonization surveillance program. It is not intended to diagnose MRSA infection nor to guide or monitor treatment for MRSA infections. Performed at [facility], [place] [address] [place], [place] [address]   Culture, blood (Routine X 2) w Reflex to ID Panel     Status: None (Preliminary result)   Collection Time: 04/21/18 12:08 PM  Result Value Ref Range Status   Specimen Description BLOOD RIGHT HAND  Final   Special Requests   Final    BOTTLES DRAWN AEROBIC ONLY Blood Culture adequate volume   Culture   Final    NO GROWTH 3 DAYS Performed at [facility], [place] [address], [place], [place] [address]    Report Status PENDING  Incomplete  Culture, blood (Routine X 2) w Reflex to ID Panel     Status: None (Preliminary result)   Collection Time: 04/21/18 12:12 PM  Result Value Ref Range Status   Specimen Description BLOOD RIGHT HAND  Final   Special Requests   Final    BOTTLES DRAWN AEROBIC ONLY Blood Culture adequate volume   Culture   Final    NO GROWTH 3 DAYS Performed at [facility], [place] [address], [place],  [address]    Report Status PENDING  Incomplete      Radiology Studies: Dg Chest 2 View  Result Date: 04/24/2018 CLINICAL DATA:  CHF. EXAM: CHEST - 2 VIEW COMPARISON:  11/09/2017. FINDINGS: Cardiomegaly with diffuse bilateral interstitial prominence and bilateral pleural  effusions suggesting CHF. Bibasilar atelectasis. Degenerative change thoracic spine. Old healed right fifth rib fracture. IMPRESSION: Cardiomegaly with diffuse bilateral from interstitial prominence and bilateral pleural effusions suggesting CHF. Similar findings noted on prior exam. Electronically Signed   By: [place]   On: 04/24/2018 12:52    Scheduled Meds: . amiodarone  200 mg Oral Daily  . aspirin EC  81 mg Oral Daily  .  atorvastatin  20 mg Oral QHS  . Chlorhexidine Gluconate Cloth  6 each Topical Q0600  . clopidogrel  75 mg Oral Daily  . [START ON 04/25/2018] Darbepoetin Alfa  200 mcg Intravenous Q Fri-HD  . feeding supplement  1 Container Oral BID BM  . feeding supplement (PRO-STAT SUGAR FREE 64)  30 mL Oral Daily  . levothyroxine  50 mcg Oral QAC breakfast  . lidocaine-prilocaine   Topical Q M,W,F  . multivitamin  1 tablet Oral QHS  . sodium chloride flush  3 mL Intravenous Q12H   Continuous Infusions: . sodium chloride    . aztreonam 100 mL/hr at 04/24/18 5248  . dextrose Stopped (04/23/18 1100)  . ferric gluconate (FERRLECIT/NULECIT) IV Stopped (04/23/18 1632)  . heparin 1,200 Units/hr (04/24/18 0700)  . [START ON 04/25/2018] vancomycin       LOS: 6 days   Time spent: 25 minutes.  Patrecia Pour, MD [facility] [email or web address] Password [email or web address] 04/24/2018, 5:13 PM

## 2018-04-24 NOTE — Progress Notes (Signed)
Pt's left arm is swollen and red above fistula. Site has worsen since pt's initial assessment. Pt has also had a low grade fever over night. Paged NP K Schorr that ordered one time vancomycin

## 2018-04-24 NOTE — Progress Notes (Signed)
[place] for Heparin Indication: 2 vessel CAD  Allergies  Allergen Reactions  . Penicillins Anaphylaxis and Other (See Comments)    Passed out  . Tetanus Toxoids Anaphylaxis  . Sulfa Antibiotics Other (See Comments)    Childhood reaction    Patient Measurements: Height: 5\' 6"  (167.6 cm) Weight: 171 lb 3.2 oz (77.7 kg) IBW/kg (Calculated) : 63.8  Vital Signs: Temp: 98.5 F (36.9 C) (10/17 1707) Temp Source: Oral (10/17 1707) BP: 99/55 (10/17 1707) Pulse Rate: 46 (10/17 1707)  Labs: Recent Labs    04/22/18 0615 04/23/18 0339 04/24/18 0231 04/24/18 1120 04/24/18 1837  HGB 10.9* 10.1* 10.3*  --   --   HCT 34.0* 32.2* 32.3*  --   --   PLT 226 221 227  --   --   HEPARINUNFRC  --   --  0.17* 0.30 0.22*  CREATININE  --  7.46* 5.25*  --   --     Estimated Creatinine Clearance: 11.6 mL/min (A) (by C-G formula based on SCr of 5.25 mg/dL (H)).  Assessment: 77 y.o. M presents with STEMI. Pt was on coumadin PTA with admission INR 4.56 (reversed with vit K 5mg ) - down to 1.43. S/p cath 10/16 which shows severe 2v CAD. Considering PCI with impella support vs medical management. Heparin restarted post cath.   Hgb 10.1 (stable) (s/p PRBC x5 10/11-10/12). Pt with CKD stage 5 and new HD initiation.  Heparin level subtherapeutic at 0.22, will increase drip and recheck with am labs.   Goal of Therapy:  Heparin level 0.3-0.7 units/ml Monitor platelets by anticoagulation protocol: Yes   Plan:  -Increase heparin drip to 1350 units/hr -Recheck with am labs  Arrie Senate, PharmD, BCPS Clinical Pharmacist [phone] Please check AMION for all [facility] numbers 04/24/2018
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
## 2018-04-24 NOTE — Evaluation (Signed)
Occupational Therapy Evaluation Patient Details Name: Troy [name] MRN: [id number] DOB: [birth year] Today's Date: 04/24/2018    History of Present Illness Patient is a 77 y/o male who presents with chest pain. 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 PMH includes stroke, PAF, HTN, CKD, CHF.   Clinical Impression   Pt admitted with the above diagnoses and presents with below problem list. Pt will benefit from continued acute OT to address the below listed deficits and maximize independence with basic ADLs prior to d/c to venue below. PTA pt was mod I with basic ADLs, daughter helps with shopping, uses transport services for appointments. Pt is currently mod - max A with bathing/dressing, min to mod A with pivotal transfers to [facility]. Elevated LUE at end of session with pt sitting up in recliner.        Follow Up Recommendations  SNF    Equipment Recommendations  Other (comment)(defer to next venue)    Recommendations for Other Services       Precautions / Restrictions Precautions Precautions: Fall Precaution Comments: soft BP Restrictions Weight Bearing Restrictions: No      Mobility Bed Mobility Overal bed mobility: Needs Assistance Bed Mobility: Supine to Sit     Supine to sit: Mod assist;HOB elevated     General bed mobility comments: Light assist for LEs, assist to powerup trunk and pivot to full EOB position  Transfers Overall transfer level: Needs assistance Equipment used: Rolling walker (2 wheeled) Transfers: Sit to/from [facility] Sit to Stand: Mod assist Stand pivot transfers: Min assist       General transfer comment: Assist to power to standing with cues for hand placement/technique.    Balance Overall balance assessment: Needs assistance Sitting-balance support: Feet supported;Single extremity supported Sitting balance-Leahy Scale: Fair     Standing balance support: During functional activity;Bilateral upper  extremity supported Standing balance-Leahy Scale: Poor Standing balance comment: Reliant on RW for standing.                           ADL either performed or assessed with clinical judgement   ADL Overall ADL's : Needs assistance/impaired Eating/Feeding: Set up;Sitting   Grooming: Set up;Sitting;Minimal assistance   Upper Body Bathing: Moderate assistance;Sitting   Lower Body Bathing: Maximal assistance;Sit to/from stand   Upper Body Dressing : Moderate assistance;Sitting   Lower Body Dressing: Maximal assistance;Sit to/from stand   Toilet Transfer: Moderate assistance;Stand-pivot;BSC;RW   Toileting- Clothing Manipulation and Hygiene: Maximal assistance;Sit to/from stand Toileting - Clothing Manipulation Details (indicate cue type and reason): Pt stood with rw while NT completed pericare. Tub/ Shower Transfer: Moderate assistance;Stand-pivot;3 in 1;Rolling walker   Functional mobility during ADLs: Minimal assistance;+2 for safety/equipment;Rolling walker(pivotal steps EOB>BSC>recliner) General ADL Comments: Pt completed bed mobility, sat EOB a couple of minutes then pivotal steps to [facility] then to recliner. Pericare also completed as detailed above.     Vision         Perception     Praxis      Pertinent Vitals/Pain Pain Assessment: Faces Faces Pain Scale: Hurts little more Pain Location: L forearm, wrist, hand Pain Descriptors / Indicators: Guarding Pain Intervention(s): Monitored during session;Repositioned;Limited activity within patient's tolerance     Hand Dominance Right   Extremity/Trunk Assessment Upper Extremity Assessment Upper Extremity Assessment: Generalized weakness;LUE deficits/detail LUE Deficits / Details:  left arm is swollen and red above fistula. Nursing and MD aware   Lower Extremity Assessment  Lower Extremity Assessment: Defer to PT evaluation   Cervical / Trunk Assessment Cervical / Trunk Assessment: Kyphotic   Communication  Communication Communication: HOH   Cognition Arousal/Alertness: Awake/alert Behavior During Therapy: WFL for tasks assessed/performed Overall Cognitive Status: Within Functional Limits for tasks assessed                                     General Comments  No family present during OT eval.    Exercises     Shoulder Instructions      Home Living Family/patient expects to be discharged to:: Private residence Living Arrangements: Spouse/significant other Available Help at Discharge: Family;Available 24 hours/day Type of Home: House Home Access: Ramped entrance     Home Layout: One level     Bathroom Shower/Tub: Teacher, early years/pre: Standard     Home Equipment: Cane - single point;Walker - 4 wheels;Bedside commode;Wheelchair - manual          Prior Functioning/Environment Level of Independence: Independent with assistive device(s)        Comments: Does cooking. Daughter helps with grocery shopping. Uses [facility] for MD appts.        OT Problem List: Decreased activity tolerance;Impaired balance (sitting and/or standing);Decreased strength;Decreased knowledge of use of DME or AE;Decreased knowledge of precautions;Cardiopulmonary status limiting activity;Increased edema;Pain;Impaired UE functional use      OT Treatment/Interventions: Self-care/ADL training;Energy conservation;DME and/or AE instruction;Therapeutic activities;Patient/family education;Balance training    OT Goals(Current goals can be found in the care plan section) Acute Rehab OT Goals Patient Stated Goal: to get better OT Goal Formulation: With patient Time For Goal Achievement: 05/08/18 Potential to Achieve Goals: Good ADL Goals Pt Will Perform Upper Body Bathing: sitting;with supervision Pt Will Perform Lower Body Bathing: with min guard assist;sit to/from stand Pt Will Perform Upper Body Dressing: with set-up;sitting Pt Will Perform Lower Body Dressing: with min guard  assist;sit to/from stand Pt Will Transfer to Toilet: with supervision;ambulating Pt Will Perform Toileting - Clothing Manipulation and hygiene: with min guard assist;sit to/from stand Additional ADL Goal #1: Pt will complete bed mobility at supervision level to prepare for OOB ADLs.  OT Frequency: Min 2X/week   Barriers to D/C:            Co-evaluation              AM-PAC PT "6 Clicks" Daily Activity     Outcome Measure Help from another person eating meals?: None Help from another person taking care of personal grooming?: A Little Help from another person toileting, which includes using toliet, bedpan, or urinal?: A Lot Help from another person bathing (including washing, rinsing, drying)?: A Lot Help from another person to put on and taking off regular upper body clothing?: A Little Help from another person to put on and taking off regular lower body clothing?: A Lot 6 Click Score: 16   End of Session Equipment Utilized During Treatment: Rolling walker;Oxygen Nurse Communication: (NT present throughout session)  Activity Tolerance: Patient limited by fatigue;Patient tolerated treatment well Patient left: in chair;with call bell/phone within reach;with chair alarm set  OT Visit Diagnosis: Unsteadiness on feet (R26.81);Muscle weakness (generalized) (M62.81);Pain Pain - Right/Left: Left Pain - part of body: Arm                Time: 5329-9242 OT Time Calculation (min): 26 min Charges:  OT General Charges $OT Visit: 1 Visit OT  Evaluation $OT Eval Low Complexity: 1 Low OT Treatments $Self Care/Home Management : 8-22 mins  Tyrone Schimke, OT Acute Rehabilitation Services Pager: [phone] Office: [phone]   Hortencia Pilar 04/24/2018, 10:37 AM

## 2018-04-24 NOTE — Progress Notes (Addendum)
Pharmacy Antibiotic Note  Troy [name] is a 77 y.o. male with possible AVF infection.  Pharmacy has been consulted for Vancomycin and Aztreonam  dosing.  Plan: Vancomycin 1250 mg IV now, then 750 mg IV after each HD Aztreonam 500 mg IV q12h   Height: 5\' 6"  (167.6 cm) Weight: 171 lb 3.2 oz (77.7 kg) IBW/kg (Calculated) : 63.8  Temp (24hrs), Avg:98.7 F (37.1 C), Min:97.7 F (36.5 C), Max:100.2 F (37.9 C)  Recent Labs  Lab 04/19/18 0625 04/20/18 0915 04/21/18 0403 04/22/18 0615 04/23/18 0339 04/24/18 0231  WBC 15.4* 20.4* 20.6* 17.3* 19.5* 30.6*  CREATININE 7.72* 6.32* 7.69*  --  7.46* 5.25*    Estimated Creatinine Clearance: 11.6 mL/min (A) (by C-G formula based on SCr of 5.25 mg/dL (H)).    Allergies  Allergen Reactions  . Penicillins Anaphylaxis and Other (See Comments)    Passed out  . Tetanus Toxoids Anaphylaxis  . Sulfa Antibiotics Other (See Comments)    Childhood reaction    Caryl Pina 04/24/2018 5:40 AM

## 2018-04-24 NOTE — Progress Notes (Signed)
Progress Note  Patient Name: Troy [name] Date of Encounter: 04/24/2018  Primary Cardiologist: Kirk Ruths, MD   Subjective   No CP or dyspnea; denies cough or diarrhea; some pain at fistula site LUE.  Inpatient Medications    Scheduled Meds: . sodium chloride   Intravenous Once  . sodium chloride   Intravenous Once  . amiodarone  200 mg Oral Daily  . aspirin EC  81 mg Oral Daily  . atorvastatin  20 mg Oral QHS  . Chlorhexidine Gluconate Cloth  6 each Topical Q0600  . clopidogrel  75 mg Oral Daily  . [START ON 04/25/2018] Darbepoetin Alfa  200 mcg Intravenous Q Fri-HD  . feeding supplement  1 Container Oral BID BM  . feeding supplement (PRO-STAT SUGAR FREE 64)  30 mL Oral Daily  . levothyroxine  50 mcg Oral QAC breakfast  . lidocaine-prilocaine   Topical Q M,W,F  . metoprolol tartrate  12.5 mg Oral BID  . multivitamin  1 tablet Oral QHS  . sodium chloride flush  3 mL Intravenous Q12H   Continuous Infusions: . sodium chloride    . aztreonam 100 mL/hr at 04/24/18 0350  . dextrose Stopped (04/23/18 1100)  . ferric gluconate (FERRLECIT/NULECIT) IV Stopped (04/23/18 1632)  . heparin 1,200 Units/hr (04/24/18 0436)  . [START ON 04/25/2018] vancomycin     PRN Meds: sodium chloride, acetaminophen, nitroGLYCERIN, ondansetron **OR** ondansetron (ZOFRAN) IV, sodium chloride flush, zolpidem   Vital Signs    Vitals:   04/23/18 2239 04/24/18 0057 04/24/18 0529 04/24/18 0640  BP:  (!) 111/49 (!) 116/47   Pulse:  (!) 54    Resp:  20    Temp: 100.2 F (37.9 C) 99 F (37.2 C)  97.7 F (36.5 C)  TempSrc:  Oral  Oral  SpO2:  94% 95%   Weight:   77.7 kg   Height:        Intake/Output Summary (Last 24 hours) at 04/24/2018 0815 Last data filed at 04/24/2018 0938 Gross per 24 hour  Intake 285.23 ml  Output 2000 ml  Net -1714.77 ml   Filed Weights   04/23/18 1150 04/23/18 1602 04/24/18 0529  Weight: 78 kg 76 kg 77.7 kg    Telemetry    Sinus bradycardia with PVCs  and rare couplet; PAF- Personally Reviewed  Physical Exam   GEN: No acute distress. Elderly Neck: No JVD Cardiac: bradycardic Respiratory: CTA; no wheeze GI: Soft, NT/ND, no masses MS: No edema; erythema proximal to AV fistula LUE Neuro:  No focal findings   Labs    Chemistry Recent Labs  Lab 04/21/18 0403 04/23/18 0339 04/24/18 0231  NA 134* 132* 134*  K 3.8 4.0 3.9  CL 97* 97* 97*  CO2 21* 21* 24  GLUCOSE 116* 113* 85  BUN 94* 78* 40*  CREATININE 7.69* 7.46* 5.25*  CALCIUM 7.6* 7.5* 7.3*  ALBUMIN  --   --  2.3*  GFRNONAA 6* 6* 9*  GFRAA 7* 7* 11*  ANIONGAP 16* 14 13     Hematology Recent Labs  Lab 04/22/18 0615 04/23/18 0339 04/24/18 0231  WBC 17.3* 19.5* 30.6*  RBC 3.56* 3.31* 3.31*  HGB 10.9* 10.1* 10.3*  HCT 34.0* 32.2* 32.3*  MCV 95.5 97.3 97.6  MCH 30.6 30.5 31.1  MCHC 32.1 31.4 31.9  RDW 19.6* 19.6* 19.9*  PLT 226 221 227    Cardiac Enzymes Recent Labs  Lab 04/18/18 2342 04/19/18 0625 04/19/18 1216  TROPONINI 8.18* 7.96* 7.93*  Recent Labs  Lab 04/18/18 1210  TROPIPOC 8.79*     BNP Recent Labs  Lab 04/18/18 1200  BNP >4,500.0*     Patient Profile     77 y.o. male admitted with non-ST elevation myocardial infarction.  Also with history of chronic kidney disease and dialysis has now been initiated.  Also with history of paroxysmal atrial fibrillation, chronic combined systolic/diastolic congestive heart failure, hypertension, hyperlipidemia, prior CVA, peripheral vascular disease.  Has required transfusion this admission for bleeding from AV fistula.  Echocardiogram shows newly reduced LV function with ejection fraction 25 to 30%.  There is moderate mitral regurgitation.  Assessment & Plan    1 non-ST elevation myocardial infarction-cardiac catheterization results noted.  Patient has severe right coronary artery disease and significant LAD disease.  I personally reviewed films with Dr. Irish Lack.  Plan is PCI of LAD and treat RCA  medically.  If he has recurrent symptoms despite above then could consider PCI of RCA though would be higher risk.  He would likely require temporary pacer and possible hemodynamic support.  Continue aspirin, Plavix and statin.  Note he had low-grade fever and there is erythema above AV fistula site.  He has been initiated on antibiotics.  He is tentatively scheduled for PCI tomorrow pending fever/infectious course.    2 ischemic CM-heart rate decreased.  Discontinue metoprolol.  Will not add hydralazine to nitrates as blood pressure is borderline.  3 acute on chronic kidney disease-dialysis per nephrology.  4 paroxysmal atrial fibrillation-patient with transient paroxysmal atrial fibrillation on telemetry.  In sinus this morning.  Continue amiodarone.  Coumadin on hold for now. Continue heparin.  5 acute systolic congestive heart failure-volume is being managed by dialysis.  6 elevated white blood cell count-appears to have potential infection of AV fistula/left upper extremity.  Antibiotics initiated.  Per internal medicine.  For questions or updates, please contact [place] Please consult [email or web address] for contact info under        Signed, Kirk Ruths, MD  04/24/2018, 8:15 AM

## 2018-04-24 NOTE — Progress Notes (Addendum)
4:21 pm Patient's daughter [name] to go to [facility] tomorrow morning and complete admissions paperwork. Continue to await [facility] auth for patient.  8:28 am Patient's daughter chose [facility]. SNF to start patient's [facility] authorization; auth required before patient can admit to the facility. SNF will need updated PT/OT notes from today in order to obtain auth, as patient was last seen 04/22/18. CSW to follow and support with discharge planning.  Estanislado Emms, [place]

## 2018-04-24 NOTE — Progress Notes (Signed)
[place] for Heparin Indication: 2 vessel CAD  Allergies  Allergen Reactions  . Penicillins Anaphylaxis and Other (See Comments)    Passed out  . Tetanus Toxoids Anaphylaxis  . Sulfa Antibiotics Other (See Comments)    Childhood reaction    Patient Measurements: Height: 5\' 6"  (167.6 cm) Weight: 171 lb 3.2 oz (77.7 kg) IBW/kg (Calculated) : 63.8  Vital Signs: Temp: 97.7 F (36.5 C) (10/17 0640) Temp Source: Oral (10/17 0640) BP: 116/47 (10/17 0529)  Labs: Recent Labs    04/22/18 0615 04/23/18 0339 04/24/18 0231 04/24/18 1120  HGB 10.9* 10.1* 10.3*  --   HCT 34.0* 32.2* 32.3*  --   PLT 226 221 227  --   HEPARINUNFRC  --   --  0.17* 0.30  CREATININE  --  7.46* 5.25*  --     Estimated Creatinine Clearance: 11.6 mL/min (A) (by C-G formula based on SCr of 5.25 mg/dL (H)).  Assessment: 77 y.o. M presents with STEMI. Pt was on coumadin PTA with admission INR 4.56 (reversed with vit K 5mg ) - down to 1.43. S/p cath 10/16 which shows severe 2v CAD. Considering PCI with impella support vs medical management. Heparin restarted post cath.   Hgb 10.1 (stable) (s/p PRBC x5 10/11-10/12). Pt with CKD stage 5 and new HD initiation.  Heparin level at low end of therapeutic (0.3) on gtt at 1200 units/hr. CBC stable. No bleeding noted.  Goal of Therapy:  Heparin level 0.3-0.7 units/ml Monitor platelets by anticoagulation protocol: Yes   Plan:  Continue heparin drip at 1200 units/hr Will f/u 6 hr confirmatory heparin level since at low end of therapeutic range Daily heparin level and CBC Tentatively scheduled for PCI 10/18  Sherlon Handing, PharmD, BCPS Clinical pharmacist  **Pharmacist phone directory can now be found on [email or web address] ([email or web address]).  Listed under [place]. 04/24/2018,1:26 PM

## 2018-04-24 NOTE — Progress Notes (Signed)
Subjective: Interval History: has no complaint , for PCI.  Objective: Vital signs in last 24 hours: Temp:  [97.7 F (36.5 C)-100.2 F (37.9 C)] 97.7 F (36.5 C) (10/17 0640) Pulse Rate:  [42-90] 54 (10/17 0057) Resp:  [13-23] 20 (10/17 0057) BP: (94-124)/(24-57) 116/47 (10/17 0529) SpO2:  [89 %-99 %] 95 % (10/17 0529) Weight:  [76 kg-78 kg] 77.7 kg (10/17 0529) Weight change: 0.571 kg  Intake/Output from previous day: 10/16 0701 - 10/17 0700 In: 285.2 [I.V.:283.1; IV Piggyback:2.1] Out: 2000  Intake/Output this shift: No intake/output data recorded.  General appearance: cooperative, no distress, pale and slowed mentation Resp: diminished breath sounds bilaterally Cardio: S1, S2 normal and systolic murmur: systolic ejection 2/6, crescendo and decrescendo at 2nd left intercostal space GI: soft, non-tender; bowel sounds normal; no masses,  no organomegaly Extremities: AVF LFA with bruising  Lab Results: Recent Labs    04/23/18 0339 04/24/18 0231  WBC 19.5* 30.6*  HGB 10.1* 10.3*  HCT 32.2* 32.3*  PLT 221 227   BMET:  Recent Labs    04/23/18 0339 04/24/18 0231  NA 132* 134*  K 4.0 3.9  CL 97* 97*  CO2 21* 24  GLUCOSE 113* 85  BUN 78* 40*  CREATININE 7.46* 5.25*  CALCIUM 7.5* 7.3*   No results for input(s): PTH in the last 72 hours. Iron Studies: No results for input(s): IRON, TIBC, TRANSFERRIN, FERRITIN in the last 72 hours.  Studies/Results: No results found.  I have reviewed the patient's current medications.  Assessment/Plan: 1 ESRD HD going fair.  Vol better.  2 CAD per cards for PCI 3 Hx CVA 4 Anemia on esa/fe 5 HPTH vit D 6 ^^WBC no signs infx. Check UA, CXR P HD in am, CXR, UA  LOS: 6 days   Jeneen Rinks Taneeka Curtner 04/24/2018,10:27 AM

## 2018-04-24 NOTE — Progress Notes (Signed)
Physical Therapy Treatment Patient Details Name: Troy [name] MRN: [id number] DOB: 26-Dec-1940 Today's Date: 04/24/2018    History of Present Illness Patient is a 77 y.o. male admitted 04/18/18 with chest pain. Found to have NSTEMI, uremia and new ESRD; started on HD. Pt with severe R CAD and significant LAD disease; plan for PCI of LAD on 10/18. PMH includes stroke, PAF, HTN, CKD, CHF.   PT Comments    Pt slowly progressing with mobility. Remains motivated to participate and regain strength/endurance. Able to ambulate 79' x2 with RW and intermittent minA; required seated rest secondary to fatigue. SpO2 down to 89% on RA, returned to 1L O2 [place] (RN notified). Continue to recommend SNF-level therapies. Will follow acutely.   Follow Up Recommendations  SNF;Supervision for mobility/OOB     Equipment Recommendations  None recommended by PT    Recommendations for Other Services       Precautions / Restrictions Precautions Precautions: Fall Restrictions Weight Bearing Restrictions: No    Mobility  Bed Mobility Overal bed mobility: Needs Assistance Bed Mobility: Supine to Sit;Sit to Supine     Supine to sit: Mod assist;HOB elevated Sit to supine: Supervision   General bed mobility comments: ModA to assist trnk elevation  Transfers Overall transfer level: Needs assistance Equipment used: Rolling walker (2 wheeled) Transfers: Sit to/from Stand Sit to Stand: Min assist         General transfer comment: MinA to assist trunk elevation; heavy reliance on UE and RW support to come into standing. Stood 2x from bed and chair with no arm rests  Ambulation/Gait Ambulation/Gait assistance: Counsellor (Feet): 26 Feet(+ 26') Assistive device: Rolling walker (2 wheeled) Gait Pattern/deviations: Step-through pattern;Decreased stride length;Trunk flexed Gait velocity: Decreased Gait velocity interpretation: <1.8 ft/sec, indicate of risk for recurrent falls General Gait  Details: Slow, flexed forward ambulation with RW and min guard for balance. Intermittent cues for upright posture and to maintain closer proximity to RW. 2x assist for RW management. SpO2 94% on RA. 1x seated rest break secondary to fatigue   Stairs             Wheelchair Mobility    Modified Rankin (Stroke Patients Only)       Balance Overall balance assessment: Needs assistance Sitting-balance support: Feet supported;Single extremity supported Sitting balance-Leahy Scale: Fair     Standing balance support: During functional activity;Bilateral upper extremity supported Standing balance-Leahy Scale: Poor Standing balance comment: Reliant on UE support                            Cognition Arousal/Alertness: Awake/alert Behavior During Therapy: WFL for tasks assessed/performed Overall Cognitive Status: Within Functional Limits for tasks assessed                                 General Comments: for basic mobility tasks- [facility]      Exercises      General Comments General comments (skin integrity, edema, etc.): Daughter and wife present      Pertinent Vitals/Pain Pain Assessment: Faces Faces Pain Scale: Hurts little more Pain Location: LUE Pain Descriptors / Indicators: Guarding;Sore Pain Intervention(s): Monitored during session;Limited activity within patient's tolerance    Home Living                      Prior Function  PT Goals (current goals can now be found in the care plan section) Acute Rehab PT Goals Patient Stated Goal: to get better PT Goal Formulation: With patient Time For Goal Achievement: 05/05/18 Potential to Achieve Goals: Good Progress towards PT goals: Progressing toward goals    Frequency    Min 2X/week      PT Plan Current plan remains appropriate    Co-evaluation              AM-PAC PT "6 Clicks" Daily Activity  Outcome Measure  Difficulty turning over in bed (including  adjusting bedclothes, sheets and blankets)?: A Little Difficulty moving from lying on back to sitting on the side of the bed? : Unable Difficulty sitting down on and standing up from a chair with arms (e.g., wheelchair, bedside commode, etc,.)?: Unable Help needed moving to and from a bed to chair (including a wheelchair)?: A Little Help needed walking in hospital room?: A Little Help needed climbing 3-5 steps with a railing? : A Lot 6 Click Score: 13    End of Session Equipment Utilized During Treatment: Gait belt;Oxygen Activity Tolerance: Patient tolerated treatment well;Patient limited by fatigue Patient left: in bed;with call bell/phone within reach;with bed alarm set;with family/visitor present Nurse Communication: Mobility status PT Visit Diagnosis: Muscle weakness (generalized) (M62.81);Difficulty in walking, not elsewhere classified (R26.2);Unsteadiness on feet (R26.81)     Time: 1517-6160 PT Time Calculation (min) (ACUTE ONLY): 17 min  Charges:  $Gait Training: 8-22 mins                     Mabeline Caras, PT, DPT Acute Rehabilitation Services  Pager [phone] Office [place] 04/24/2018, 5:14 PM

## 2018-04-25 ENCOUNTER — Inpatient Hospital Stay (HOSPITAL_COMMUNITY): Payer: Medicare Other

## 2018-04-25 HISTORY — PX: IR FLUORO GUIDE CV LINE RIGHT: IMG2283

## 2018-04-25 HISTORY — PX: IR US GUIDE VASC ACCESS RIGHT: IMG2390

## 2018-04-25 LAB — RENAL FUNCTION PANEL
ALBUMIN: 2.3 g/dL — AB (ref 3.5–5.0)
Anion gap: 17 — ABNORMAL HIGH (ref 5–15)
BUN: 66 mg/dL — AB (ref 8–23)
CHLORIDE: 96 mmol/L — AB (ref 98–111)
CO2: 21 mmol/L — ABNORMAL LOW (ref 22–32)
CREATININE: 7.09 mg/dL — AB (ref 0.61–1.24)
Calcium: 7.4 mg/dL — ABNORMAL LOW (ref 8.9–10.3)
GFR calc Af Amer: 8 mL/min — ABNORMAL LOW (ref >60)
GFR, EST NON AFRICAN AMERICAN: 7 mL/min — AB (ref >60)
GLUCOSE: 81 mg/dL (ref 70–99)
PHOSPHORUS: 7.4 mg/dL — AB (ref 2.5–4.6)
Potassium: 3.9 mmol/L (ref 3.5–5.1)
Sodium: 134 mmol/L — ABNORMAL LOW (ref 135–145)

## 2018-04-25 LAB — CBC
HCT: 31.3 % — ABNORMAL LOW (ref 39.0–52.0)
Hemoglobin: 9.8 g/dL — ABNORMAL LOW (ref 13.0–17.0)
MCH: 30.7 pg (ref 26.0–34.0)
MCHC: 31.3 g/dL (ref 30.0–36.0)
MCV: 98.1 fL (ref 80.0–100.0)
NRBC: 0.1 % (ref 0.0–0.2)
PLATELETS: 236 10*3/uL (ref 150–400)
RBC: 3.19 MIL/uL — ABNORMAL LOW (ref 4.22–5.81)
RDW: 19.3 % — AB (ref 11.5–15.5)
WBC: 20.8 10*3/uL — ABNORMAL HIGH (ref 4.0–10.5)

## 2018-04-25 LAB — PROTIME-INR
INR: 1.51
PROTHROMBIN TIME: 18 s — AB (ref 11.4–15.2)

## 2018-04-25 LAB — HEPARIN LEVEL (UNFRACTIONATED): Heparin Unfractionated: 0.42 IU/mL (ref 0.30–0.70)

## 2018-04-25 IMAGING — US IR FLUORO GUIDE CV LINE*R*
1 series · 1 of 1 positions shown · non-contrast
Comparison: none

INDICATION: Renal failure

[Series 1: ir fluoro guide cv line*right* · 1 of 1 slices shown]
[im 1/1]
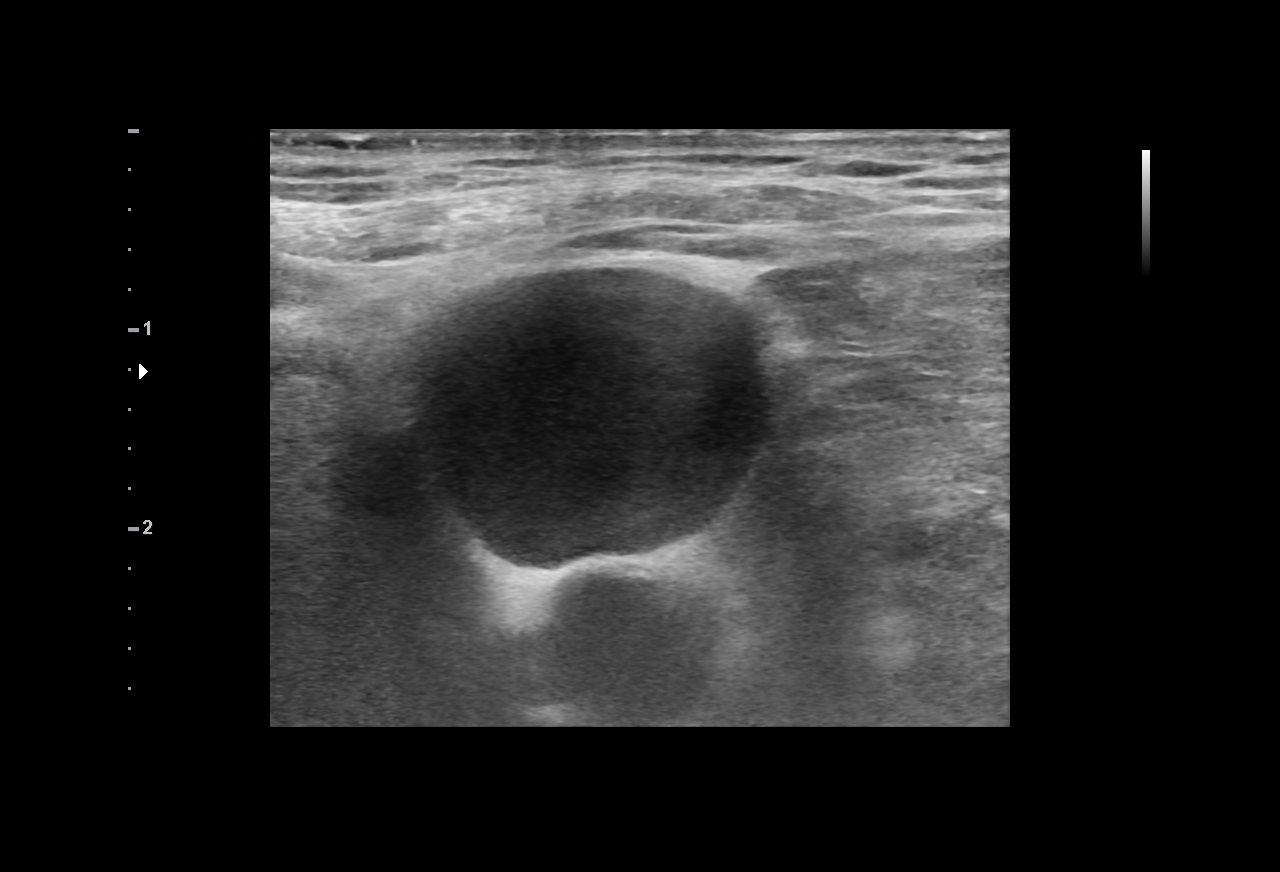

[1 of 1 positions shown; findings below may reference images not displayed]

EXAM:
TEMPORARY DIALYSIS CATHETER PLACEMENT, RIGHT JUGULAR

MEDICATIONS:
None

ANESTHESIA/SEDATION:
None.

FLUOROSCOPY TIME:  Fluoroscopy Time: 0 minutes 6 seconds (1 mGy).

COMPLICATIONS:
None immediate.

PROCEDURE:
Informed written consent was obtained from the patient after a
thorough discussion of the procedural risks, benefits and
alternatives. All questions were addressed. Maximal Sterile Barrier
Technique was utilized including caps, mask, sterile gowns, sterile
gloves, sterile drape, hand hygiene and skin antiseptic. A timeout
was performed prior to the initiation of the procedure.

The right neck was prepped and draped in a sterile fashion. 1%
lidocaine was utilized for local anesthesia. Under sonographic
guidance, a micropuncture needle was inserted into the right jugular
vein and removed over an 018 wire which was up sized to an Amplatz.
Twelve French dilator followed by a 12 French Trialysis temporary
dialysis catheter was inserted over the wire. It was flushed and
sewn in place. The right jugular vein was noted to be patent.
Sonographic documentation was obtained.
FINDINGS: Imaging documents placement of a temporary right jugular dialysis
catheter with its tip at the cavoatrial junction.
IMPRESSION: Successful temporary right jugular dialysis catheter placement with
its tip at the cavoatrial junction. It is ready for use.

## 2018-04-25 MED ORDER — LIDOCAINE HCL 1 % IJ SOLN
INTRAMUSCULAR | Status: AC
  Filled 2018-04-25: qty 20

## 2018-04-25 MED ORDER — HEPARIN SODIUM (PORCINE) 1000 UNIT/ML IJ SOLN
INTRAMUSCULAR | Status: AC
  Administered 2018-04-26: 1000 [IU] via INTRAVENOUS
  Filled 2018-04-25: qty 1

## 2018-04-25 MED ORDER — SODIUM CHLORIDE 0.9% FLUSH
3.0000 mL | Freq: Two times a day (BID) | INTRAVENOUS | Status: DC
  Administered 2018-04-25 – 2018-04-28 (×5): 3 mL via INTRAVENOUS

## 2018-04-25 MED ORDER — SODIUM CHLORIDE 0.9 % IV SOLN: 250.0000 mL | INTRAVENOUS | Status: DC | PRN

## 2018-04-25 MED ORDER — SODIUM CHLORIDE 0.9% FLUSH: 3.0000 mL | INTRAVENOUS | Status: DC | PRN

## 2018-04-25 MED ORDER — ASPIRIN 81 MG PO CHEW
81.0000 mg | CHEWABLE_TABLET | ORAL | Status: AC
  Filled 2018-04-25: qty 1

## 2018-04-25 MED ORDER — HEPARIN SODIUM (PORCINE) 1000 UNIT/ML IJ SOLN
INTRAMUSCULAR | Status: DC | PRN
  Administered 2018-04-25: 2800 [IU] via INTRAVENOUS
  Administered 2018-04-26 (×2): 1000 [IU] via INTRAVENOUS
  Administered 2018-04-29: 2800 [IU] via INTRAVENOUS
  Administered 2018-05-02: 15:00:00 via INTRAVENOUS
  Administered 2018-05-07: 4200 [IU] via INTRAVENOUS

## 2018-04-25 MED ORDER — LIDOCAINE HCL 1 % IJ SOLN
INTRAMUSCULAR | Status: DC | PRN
  Administered 2018-04-25: 5 mL

## 2018-04-25 MED ORDER — VANCOMYCIN HCL IN DEXTROSE 750-5 MG/150ML-% IV SOLN: 750.0000 mg | INTRAVENOUS | Status: DC

## 2018-04-25 MED ORDER — SODIUM CHLORIDE 0.9 % IV SOLN: INTRAVENOUS | Status: DC

## 2018-04-25 NOTE — Progress Notes (Signed)
PROGRESS NOTE  [name]  TMA:[id number] DOB: [birth year] DOA: 04/18/2018 PCP: Gaynelle Arabian, MD  Brief Narrative: Troy [name] is a 77 y.o. 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 Also noted to have progressive renal failure with uremia for which nephrology was consulted and began hemodialysis 10/11 through remotely placed left RC AVF. An outpatient HD chair has been arranged.   Echocardiogram demonstrated worsening of LV dysfunction (EF 45-50% 2017 now 25-30%). Cardiology was consulted, recommending heart catheterization which the patient and family consented to after discussions with palliative care consultants. Left heart catheterization on 10/16 after vitamin K given for supratherapeutic INR showed LAD and RCA disease, though only LAD lesion was felt to be amenable to stenting. This is planned 10/18. On 10/17 he developed erythema proximal to fistula site for which vancomycin and aztreonam were started empirically.   Assessment & Plan: Principal Problem:   Acute on chronic systolic heart failure (HCC) Active Problems:   CKD (chronic kidney disease), stage V (HCC)   ESRD (end stage renal disease) (HCC)   Leukocytosis   Atypical chest pain   Paroxysmal A-fib (HCC)   NSTEMI (non-ST elevated myocardial infarction) ([place])   Myocardial infarction [facility])   Palliative care by specialist   DNR (do not resuscitate) discussion  NSTEMI: With hx abnormal stress testing Jan 2018 not followed by LHC due to CKD.  - LHC 04/23/2018 showed LAD and RCA lesions, plan for PCI to LAD next week (delayed due to inability to  cannulate AVF) with further plan based on clinical course (aggressive medical management vs. staged PCI to RCA). - Pt on heparin gtt, loaded with plavix, has continued DAPT, statin, and antihypertensives/antianginals as BP will tolerate. Per note, plan would by to continue DAPT x30 days without coumadin, then stop aspirin, continue plavix and restart coumadin.  Chronic combined HFrEF: EF worsened to 25-30%.  - Manage volume with HD.  - Otherwise on guideline directed therapy as BP allows.   Sinus bradycardia: Does not seem to be symptomatic.  - Holding beta blocker. Management per cardiology.  ESRD: CLIP process completed, has spot at [facility] MWF at 12:15pm.  - Dialysis per nephrology.  Acute toxic metabolic encephalopathy: Due to uremia. Resolved  Paroxysmal AFib:  - Continue amiodarone. Currently bradycardic so holding BB. - On heparin gtt, holding coumadin for now.  Supratherapeutic INR: Resolved s/p vit K. - Appreciate pharmacy assistance in dosing.   PVD, carotid stenosis: s/p CEA 2011.  - Continue ASA, statin  Anemia of CKD and acute blood loss: s/p 1u PRBCs 10/11, 2u 10/12  in setting of AVF bleeding and supratherapeutic INR.  - Monitor CBC, stable. - EPO per nephrology  Hypothyroidism: Chronic, stable.  - Continue synthroid 30mcg  Left arm cellulitis vs. fistula infection: WBC climbed 19 > 30, improved with abx. Was report of diarrhea, but this abated spontaneously and no abd pain, so cancelled CDiff order. - Monitor blood cultures (10/14 and repeated with fever 10/17) both NGTD. - Continue empiric abx - Unable to cannulate AVF. Needs shuntogram and possibly IJ if not able to use AVF.  DVT prophylaxis: Heparin Code Status: Full  Family Communication: Daughter by phone Disposition Plan: SNF is recommended disposition, will be pending further investigation or AVF and treatment of infection and cardiac evaluation.    Consultants:    Cardiology  Nephrology  Procedures:   HD   LHC 04/23/2018  Antimicrobials:  Vancomycin, aztreonam 10/17 >>   Subjective: Pain along left AVF site unchanged, no fever. Frustrated with duration of hospitalization.    Objective: Vitals:   04/25/18 0648 04/25/18 0839 04/25/18 1139 04/25/18 1300  BP: (!) 114/55 (!) 121/49 (!) 114/44   Pulse: (!) 51     Resp: 18     Temp: 98.7 F (37.1 C) 98.3 F (36.8 C)  (!) 97.4 F (36.3 C)  TempSrc: Oral Oral  Axillary  SpO2: 96% 99%  98%  Weight: 75 kg     Height:        Intake/Output Summary (Last 24 hours) at 04/25/2018 1628 Last data filed at 04/25/2018 1118 Gross per 24 hour  Intake 394.4 ml  Output -476 ml  Net 870.4 ml   Filed Weights   04/23/18 1602 04/24/18 0529 04/25/18 0648  Weight: 76 kg 77.7 kg 75 kg   Gen: 77 y.o. male in no distress Pulm: Nonlabored breathing room air. Clear. CV: Regular bradycardia. No murmur, rub, or gallop. No JVD, 1+ dependent edema. GI: Abdomen soft, non-tender, non-distended, with normoactive bowel sounds.  Ext: Warm, no deformities. Diminished thrill at AVF Skin: Less well demarcated erythema proximal to AVF, see below. No other rashes, lesions or ulcers on visualized skin.  Neuro: Alert and oriented. No focal neurological deficits. Psych: Judgement and insight appear fair. Mood euthymic & affect congruent. Behavior is appropriate.    04/24/2018:   04/25/2018:   Data Reviewed: I have personally reviewed following labs and imaging studies  CBC: Recent Labs  Lab 04/21/18 0403 04/22/18 0615 04/23/18 0339 04/24/18 0231 04/25/18 0418  WBC 20.6* 17.3* 19.5* 30.6* 20.8*  NEUTROABS  --  15.7*  --   --   --   HGB 9.8* 10.9* 10.1* 10.3* 9.8*  HCT 31.4* 34.0* 32.2* 32.3* 31.3*  MCV 96.6 95.5 97.3 97.6 98.1  PLT 253 226 221 227 782   Basic Metabolic Panel: Recent Labs  Lab 04/20/18 0915 04/21/18 0403 04/23/18 0339 04/24/18 0231 04/25/18 0418  NA 136 134* 132* 134* 134*   K 3.6 3.8 4.0 3.9 3.9  CL 100 97* 97* 97* 96*  CO2 21* 21* 21* 24 21*  GLUCOSE 115* 116* 113* 85 81  BUN 77* 94* 78* 40* 66*  CREATININE 6.32* 7.69* 7.46* 5.25* 7.09*  CALCIUM 7.8* 7.6* 7.5* 7.3* 7.4*  PHOS  --   --   --  6.3* 7.4*   GFR: Estimated Creatinine Clearance: 7.9 mL/min (A) (by C-G formula based on SCr of 7.09 mg/dL (H)). Liver Function Tests: Recent Labs  Lab 04/24/18 0231 04/25/18 0418  ALBUMIN 2.3* 2.3*   No results for input(s): LIPASE, AMYLASE in the last 168 hours. No results for input(s): AMMONIA in the last 168 hours. Coagulation Profile: Recent Labs  Lab 04/19/18 0625 04/20/18 0915 04/20/18 1229 04/21/18 0403 04/25/18 1108  INR 4.10* 2.21 1.96 1.43 1.51   Cardiac Enzymes: Recent Labs  Lab 04/18/18 2342 04/19/18 0625 04/19/18 1216  TROPONINI 8.18* 7.96* 7.93*   BNP (last 3 results) No results for input(s): PROBNP in the last 8760 hours. HbA1C: No results for input(s): HGBA1C in the last 72 hours. CBG: No results for input(s): GLUCAP in the last 168 hours. Lipid Profile:  No results for input(s): CHOL, HDL, LDLCALC, TRIG, CHOLHDL, LDLDIRECT in the last 72 hours. Thyroid Function Tests: No results for input(s): TSH, T4TOTAL, FREET4, T3FREE, THYROIDAB in the last 72 hours. Anemia Panel: No results for input(s): VITAMINB12, FOLATE, FERRITIN, TIBC, IRON, RETICCTPCT in the last 72 hours. Urine analysis:    Component Value Date/Time   COLORURINE YELLOW 06/05/2016 0328   APPEARANCEUR CLEAR 06/05/2016 0328   LABSPEC 1.010 06/05/2016 0328   PHURINE 5.0 06/05/2016 0328   GLUCOSEU NEGATIVE 06/05/2016 0328   HGBUR TRACE (A) 06/05/2016 0328   BILIRUBINUR NEGATIVE 06/05/2016 0328   KETONESUR NEGATIVE 06/05/2016 0328   PROTEINUR NEGATIVE 06/05/2016 0328   UROBILINOGEN 0.2 02/15/2010 0845   NITRITE NEGATIVE 06/05/2016 0328   LEUKOCYTESUR NEGATIVE 06/05/2016 0328   Recent Results (from the past 240 hour(s))  MRSA PCR Screening     Status: None    Collection Time: 04/18/18 10:00 PM  Result Value Ref Range Status   MRSA by PCR NEGATIVE NEGATIVE Final    Comment:        The GeneXpert MRSA Assay (FDA approved for NASAL specimens only), is one component of a comprehensive MRSA colonization surveillance program. It is not intended to diagnose MRSA infection nor to guide or monitor treatment for MRSA infections. Performed at [facility], [place] [address] [place], [place] [address]   Culture, blood (Routine X 2) w Reflex to ID Panel     Status: None (Preliminary result)   Collection Time: 04/21/18 12:08 PM  Result Value Ref Range Status   Specimen Description BLOOD RIGHT HAND  Final   Special Requests   Final    BOTTLES DRAWN AEROBIC ONLY Blood Culture adequate volume   Culture   Final    NO GROWTH 4 DAYS Performed at [facility], [place] [address], [place], [place] [address]    Report Status PENDING  Incomplete  Culture, blood (Routine X 2) w Reflex to ID Panel     Status: None (Preliminary result)   Collection Time: 04/21/18 12:12 PM  Result Value Ref Range Status   Specimen Description BLOOD RIGHT HAND  Final   Special Requests   Final    BOTTLES DRAWN AEROBIC ONLY Blood Culture adequate volume   Culture   Final    NO GROWTH 4 DAYS Performed at [facility], [place] [address] [place], [place] [address]    Report Status PENDING  Incomplete  Culture, blood (routine x 2)     Status: None (Preliminary result)   Collection Time: 04/24/18  6:00 AM  Result Value Ref Range Status   Specimen Description BLOOD RIGHT ANTECUBITAL  Final   Special Requests   Final    BOTTLES DRAWN AEROBIC AND ANAEROBIC Blood Culture adequate volume   Culture   Final    NO GROWTH 1 DAY Performed at [facility], [place] [address] [place], [place] [address]    Report Status PENDING  Incomplete  Culture, blood (routine x 2)     Status: None (Preliminary result)   Collection Time: 04/24/18  6:15 AM  Result Value Ref Range Status    Specimen Description BLOOD RIGHT HAND  Final   Special Requests   Final    BOTTLES DRAWN AEROBIC AND ANAEROBIC Blood Culture adequate volume   Culture   Final    NO GROWTH 1 DAY Performed at [facility], [place] [address] [place],  [address]    Report Status PENDING  Incomplete  Radiology Studies: Dg Chest 2 View  Result Date: 04/24/2018 CLINICAL DATA:  CHF. EXAM: CHEST - 2 VIEW COMPARISON:  11/09/2017. FINDINGS: Cardiomegaly with diffuse bilateral interstitial prominence and bilateral pleural effusions suggesting CHF. Bibasilar atelectasis. Degenerative change thoracic spine. Old healed right fifth rib fracture. IMPRESSION: Cardiomegaly with diffuse bilateral from interstitial prominence and bilateral pleural effusions suggesting CHF. Similar findings noted on prior exam. Electronically Signed   By: [place]   On: 04/24/2018 12:52    Scheduled Meds: . amiodarone  200 mg Oral Daily  . [START ON 04/26/2018] aspirin  81 mg Oral Pre-Cath  . aspirin EC  81 mg Oral Daily  . atorvastatin  20 mg Oral QHS  . Chlorhexidine Gluconate Cloth  6 each Topical Q0600  . clopidogrel  75 mg Oral Daily  . Darbepoetin Alfa  200 mcg Intravenous Q Fri-HD  . feeding supplement  1 Container Oral BID BM  . feeding supplement (PRO-STAT SUGAR FREE 64)  30 mL Oral Daily  . heparin      . levothyroxine  50 mcg Oral QAC breakfast  . lidocaine      . lidocaine-prilocaine   Topical Q M,W,F  . multivitamin  1 tablet Oral QHS  . sodium chloride flush  3 mL Intravenous Q12H  . sodium chloride flush  3 mL Intravenous Q12H   Continuous Infusions: . sodium chloride    . sodium chloride    . sodium chloride 10 mL/hr at 04/25/18 0635  . aztreonam 0.5 g (04/25/18 1120)  . dextrose Stopped (04/23/18 1100)  . ferric gluconate (FERRLECIT/NULECIT) IV Stopped (04/23/18 1632)  . heparin 1,350 Units/hr (04/25/18 1118)  . [START ON 04/28/2018] vancomycin       LOS: 7 days   Time spent: 25  minutes.  Patrecia Pour, MD [facility] [email or web address] Password [email or web address] 04/25/2018, 4:28 PM

## 2018-04-25 NOTE — Progress Notes (Signed)
Dialysis access inflamed and reddned. Unable to complete HD. MD notifed, scheduled for fistulogram later. Pt is stable. Keep NPO per MD order.

## 2018-04-25 NOTE — Progress Notes (Signed)
[place] for Heparin Indication: 2 vessel CAD  Allergies  Allergen Reactions  . Penicillins Anaphylaxis and Other (See Comments)    Passed out  . Tetanus Toxoids Anaphylaxis  . Sulfa Antibiotics Other (See Comments)    Childhood reaction    Patient Measurements: Height: 5\' 6"  (167.6 cm) Weight: 165 lb 5.5 oz (75 kg) IBW/kg (Calculated) : 63.8  Vital Signs: Temp: 98.7 F (37.1 C) (10/18 0648) Temp Source: Oral (10/18 0648) BP: 114/55 (10/18 0648) Pulse Rate: 51 (10/18 0648)  Labs: Recent Labs    04/23/18 0339  04/24/18 0231 04/24/18 1120 04/24/18 1837 04/25/18 0418  HGB 10.1*  --  10.3*  --   --  9.8*  HCT 32.2*  --  32.3*  --   --  31.3*  PLT 221  --  227  --   --  236  HEPARINUNFRC  --    < > 0.17* 0.30 0.22* 0.42  CREATININE 7.46*  --  5.25*  --   --  7.09*   < > = values in this interval not displayed.    Estimated Creatinine Clearance: 7.9 mL/min (A) (by C-G formula based on SCr of 7.09 mg/dL (H)).  Assessment: 77 y.o. M presents with STEMI. Pt was on coumadin PTA with admission INR 4.56 (reversed with vit K 5mg ) - down to 1.43. S/p cath 10/16 which shows severe 2v CAD. Considering PCI with impella support vs medical management. Heparin restarted post cath.   Heparin level is therapeutic at 0.42, on 1350 units/hr. Hgb 9.8, plt 236 (s/p PRBC x5 10/11-10/12). No s/sx of bleeding. No documented infusion issues.   Goal of Therapy:  Heparin level 0.3-0.7 units/ml Monitor platelets by anticoagulation protocol: Yes   Plan:  -Continue heparin drip to 1350 units/hr -Monitor daily HL, CBC, and for s/sx of bleeding -F/u after planned PCI  Doylene Canard, PharmD Clinical Pharmacist  Pager: [phone] Phone: [phone] Please check AMION for all [place] numbers 04/25/2018

## 2018-04-25 NOTE — Progress Notes (Signed)
Progress Note  Patient Name: Troy [name] Date of Encounter: 04/25/2018  Primary Cardiologist: Kirk Ruths, MD   Subjective   No CP or dyspnea; some LUE pain at AV fistula site  Inpatient Medications    Scheduled Meds: . amiodarone  200 mg Oral Daily  . [START ON 04/26/2018] aspirin  81 mg Oral Pre-Cath  . aspirin EC  81 mg Oral Daily  . atorvastatin  20 mg Oral QHS  . Chlorhexidine Gluconate Cloth  6 each Topical Q0600  . clopidogrel  75 mg Oral Daily  . Darbepoetin Alfa  200 mcg Intravenous Q Fri-HD  . feeding supplement  1 Container Oral BID BM  . feeding supplement (PRO-STAT SUGAR FREE 64)  30 mL Oral Daily  . levothyroxine  50 mcg Oral QAC breakfast  . lidocaine-prilocaine   Topical Q M,W,F  . multivitamin  1 tablet Oral QHS  . sodium chloride flush  3 mL Intravenous Q12H  . sodium chloride flush  3 mL Intravenous Q12H   Continuous Infusions: . sodium chloride    . sodium chloride    . sodium chloride 10 mL/hr at 04/25/18 0635  . aztreonam Stopped (04/24/18 2215)  . dextrose Stopped (04/23/18 1100)  . ferric gluconate (FERRLECIT/NULECIT) IV Stopped (04/23/18 1632)  . heparin 1,350 Units/hr (04/24/18 2144)  . vancomycin     PRN Meds: sodium chloride, sodium chloride, acetaminophen, nitroGLYCERIN, ondansetron **OR** ondansetron (ZOFRAN) IV, sodium chloride flush, sodium chloride flush, zolpidem   Vital Signs    Vitals:   04/24/18 2050 04/24/18 2154 04/24/18 2354 04/25/18 0452  BP: (!) 99/48  (!) 98/57 (!) 110/49  Pulse: (!) 53 (!) 51 (!) 50 (!) 48  Resp: 18 17 20 16   Temp: 99.8 F (37.7 C)  97.9 F (36.6 C) 97.9 F (36.6 C)  TempSrc: Oral  Oral   SpO2: 96% 97% 98% 96%  Weight:      Height:        Intake/Output Summary (Last 24 hours) at 04/25/2018 0743 Last data filed at 04/25/2018 0315 Gross per 24 hour  Intake 784.4 ml  Output -  Net 784.4 ml   Filed Weights   04/23/18 1150 04/23/18 1602 04/24/18 0529  Weight: 78 kg 76 kg 77.7 kg     Physical Exam   GEN: WD No acute distress. Elderly Neck: No JVD, supple Cardiac: bradycardic, regular Respiratory: CTA; no rhonchi GI: Soft, NT or distended MS: No edema; erythema proximal to AV fistula LUE; some improvement compared to yesterday Neuro:  Grossly intact   Labs    Chemistry Recent Labs  Lab 04/23/18 0339 04/24/18 0231 04/25/18 0418  NA 132* 134* 134*  K 4.0 3.9 3.9  CL 97* 97* 96*  CO2 21* 24 21*  GLUCOSE 113* 85 81  BUN 78* 40* 66*  CREATININE 7.46* 5.25* 7.09*  CALCIUM 7.5* 7.3* 7.4*  ALBUMIN  --  2.3* 2.3*  GFRNONAA 6* 9* 7*  GFRAA 7* 11* 8*  ANIONGAP 14 13 17*     Hematology Recent Labs  Lab 04/23/18 0339 04/24/18 0231 04/25/18 0418  WBC 19.5* 30.6* 20.8*  RBC 3.31* 3.31* 3.19*  HGB 10.1* 10.3* 9.8*  HCT 32.2* 32.3* 31.3*  MCV 97.3 97.6 98.1  MCH 30.5 31.1 30.7  MCHC 31.4 31.9 31.3  RDW 19.6* 19.9* 19.3*  PLT 221 227 236    Cardiac Enzymes Recent Labs  Lab 04/18/18 2342 04/19/18 0625 04/19/18 1216  TROPONINI 8.18* 7.96* 7.93*    Recent Labs  Lab 04/18/18 1210  TROPIPOC 8.79*     BNP Recent Labs  Lab 04/18/18 1200  BNP >4,500.0*     Patient Profile     77 y.o. male admitted with non-ST elevation myocardial infarction.  Also with history of chronic kidney disease and dialysis has now been initiated.  Also with history of paroxysmal atrial fibrillation, chronic combined systolic/diastolic congestive heart failure, hypertension, hyperlipidemia, prior CVA, peripheral vascular disease.  Has required transfusion this admission for bleeding from AV fistula.  Echocardiogram shows newly reduced LV function with ejection fraction 25 to 30%.  There is moderate mitral regurgitation.  Assessment & Plan    1 non-ST elevation myocardial infarction-as outlined in previous notes patient has severe right coronary artery disease and LAD disease.  Plan is to proceed with PCI of LAD today.  Plan medical therapy for right coronary disease  unless patient has recurrent symptoms.  PCI of RCA would be higher risk.  Continue aspirin, Plavix and statin.  Note patient does have some erythema at AV fistula site but he is not febrile and white blood cell count has improved compared to yesterday.  He is also being treated with antibiotics.    2 ischemic CM-beta-blocker has been discontinued because of bradycardia.  Blood pressure is borderline and I have therefore not added hydralazine nitrates.  3 acute on chronic kidney disease-for dialysis today per nephrology.  4 paroxysmal atrial fibrillation-continue amiodarone to help maintain sinus rhythm.  Given age and overall frail body habitus would plan aspirin and Plavix for 30 days following PCI with no Coumadin.  After 30 days to discontinue aspirin, continue Plavix and resume Coumadin at that time.  5 acute systolic congestive heart failure-volume removal per dialysis.  6 elevated white blood cell count-appears to have potential infection of AV fistula/left upper extremity.  Antibiotics initiated.  Per internal medicine. Blood cultures negative to date.  For questions or updates, please contact [place] Please consult [email or web address] for contact info under        Signed, Kirk Ruths, MD  04/25/2018, 7:43 AM

## 2018-04-25 NOTE — Procedures (Signed)
RIJV HD temp cath SVC RA EBL 0 Comp 0

## 2018-04-25 NOTE — Progress Notes (Signed)
Patient returned from IR. Temp RIJ HD cath in place. Patient A&Ox4. Mild Pain in left lower arm. Vitals signs WNL. Will continue to monitor.

## 2018-04-25 NOTE — Progress Notes (Addendum)
Spoke to Dr. Jimmy Footman, pt is scheduled to have dialysis today. Did have temp HD cath placed this afternoon. Per MD, he spoke to [place] from hemodialysis, the charge nurse, that the patient should be dialyzed today. Will continue to monitor.

## 2018-04-25 NOTE — Progress Notes (Signed)
Subjective: Interval History: has no complaint, knows of need to get shuntogram, cath.  Objective: Vital signs in last 24 hours: Temp:  [97.9 F (36.6 C)-99.8 F (37.7 C)] 98.7 F (37.1 C) (10/18 0648) Pulse Rate:  [46-53] 51 (10/18 0648) Resp:  [16-20] 18 (10/18 0648) BP: (98-114)/(48-57) 114/55 (10/18 0648) SpO2:  [96 %-98 %] 96 % (10/18 0648) Weight:  [75 kg] 75 kg (10/18 0648) Weight change: -3 kg  Intake/Output from previous day: 10/17 0701 - 10/18 0700 In: 784.4 [P.O.:530; I.V.:204.4; IV Piggyback:50] Out: -  Intake/Output this shift: Total I/O In: -  Out: -476   General appearance: alert, cooperative and no distress Resp: diminished breath sounds bilaterally and rales bibasilar Cardio: S1, S2 normal and systolic murmur: systolic ejection 2/6, crescendo and decrescendo at 2nd left intercostal space GI: soft, pos bs, liver down 5 cm Extremities: AVF LLA some bruising, B&T  Lab Results: Recent Labs    04/24/18 0231 04/25/18 0418  WBC 30.6* 20.8*  HGB 10.3* 9.8*  HCT 32.3* 31.3*  PLT 227 236   BMET:  Recent Labs    04/24/18 0231 04/25/18 0418  NA 134* 134*  K 3.9 3.9  CL 97* 96*  CO2 24 21*  GLUCOSE 85 81  BUN 40* 66*  CREATININE 5.25* 7.09*  CALCIUM 7.3* 7.4*   No results for input(s): PTH in the last 72 hours. Iron Studies: No results for input(s): IRON, TIBC, TRANSFERRIN, FERRITIN in the last 72 hours.  Studies/Results: Dg Chest 2 View  Result Date: 04/24/2018 CLINICAL DATA:  CHF. EXAM: CHEST - 2 VIEW COMPARISON:  11/09/2017. FINDINGS: Cardiomegaly with diffuse bilateral interstitial prominence and bilateral pleural effusions suggesting CHF. Bibasilar atelectasis. Degenerative change thoracic spine. Old healed right fifth rib fracture. IMPRESSION: Cardiomegaly with diffuse bilateral from interstitial prominence and bilateral pleural effusions suggesting CHF. Similar findings noted on prior exam. Electronically Signed   By: [place]   On:  04/24/2018 12:52    I have reviewed the patient's current medications.  Assessment/Plan: 1 ESRD access issues needs shuntogram, vol xs 2  Anemia esa 3 HPTH vit D 4 CAD per cards hold off PCI until after shuntogram 5 ^WBC falling, CXR with vol xs not consolidation 6 [place] 7 PAF P shuntogram , HD, hold off PCI, control vol.    LOS: 7 days   Jeneen Rinks Aliza Moret 04/25/2018,8:05 AM

## 2018-04-25 NOTE — Procedures (Signed)
I was present at this session.  I have reviewed the session itself and made appropriate changes.  HD not able to be done due to cannulation issues. Will get shuntogram.  Jeneen Rinks Etoy Mcdonnell 10/18/20198:02 AM

## 2018-04-25 NOTE — Progress Notes (Signed)
Troy [name] has received [facility] authorization for patient to admit to their facility. Patient can admit over the weekend on this auth if medically ready.   Patient's daughter [name] signing admissions paperwork at the facility this morning.  CSW will follow for patient's medical readiness and will support with discharge planning.  Estanislado Emms, [place]
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
## 2018-04-25 NOTE — Progress Notes (Signed)
   Per Nephrology recommendations, will delay staged PCI. Tentatively scheduled for Tuesday 04/29/18. Reassess on Monday to determine readiness.   Abigail Butts, PA-C 04/25/18

## 2018-04-26 DIAGNOSIS — N185 Chronic kidney disease, stage 5: Secondary | ICD-10-CM

## 2018-04-26 DIAGNOSIS — R58 Hemorrhage, not elsewhere classified: Secondary | ICD-10-CM

## 2018-04-26 DIAGNOSIS — R0789 Other chest pain: Secondary | ICD-10-CM

## 2018-04-26 LAB — RENAL FUNCTION PANEL
Albumin: 2.1 g/dL — ABNORMAL LOW (ref 3.5–5.0)
Anion gap: 18 — ABNORMAL HIGH (ref 5–15)
BUN: 81 mg/dL — AB (ref 8–23)
CHLORIDE: 98 mmol/L (ref 98–111)
CO2: 19 mmol/L — AB (ref 22–32)
CREATININE: 7.97 mg/dL — AB (ref 0.61–1.24)
Calcium: 7.1 mg/dL — ABNORMAL LOW (ref 8.9–10.3)
GFR calc Af Amer: 7 mL/min — ABNORMAL LOW (ref >60)
GFR calc non Af Amer: 6 mL/min — ABNORMAL LOW (ref >60)
Glucose, Bld: 70 mg/dL (ref 70–99)
Phosphorus: 8.6 mg/dL — ABNORMAL HIGH (ref 2.5–4.6)
Potassium: 4 mmol/L (ref 3.5–5.1)
Sodium: 135 mmol/L (ref 135–145)

## 2018-04-26 LAB — CBC
HCT: 28.9 % — ABNORMAL LOW (ref 39.0–52.0)
Hemoglobin: 9.2 g/dL — ABNORMAL LOW (ref 13.0–17.0)
MCH: 30.9 pg (ref 26.0–34.0)
MCHC: 31.8 g/dL (ref 30.0–36.0)
MCV: 97 fL (ref 80.0–100.0)
PLATELETS: 240 10*3/uL (ref 150–400)
RBC: 2.98 MIL/uL — ABNORMAL LOW (ref 4.22–5.81)
RDW: 18.8 % — AB (ref 11.5–15.5)
WBC: 18 10*3/uL — ABNORMAL HIGH (ref 4.0–10.5)
nRBC: 0.1 % (ref 0.0–0.2)

## 2018-04-26 LAB — CULTURE, BLOOD (ROUTINE X 2)
Culture: NO GROWTH
Culture: NO GROWTH
SPECIAL REQUESTS: ADEQUATE
SPECIAL REQUESTS: ADEQUATE

## 2018-04-26 LAB — HEPARIN LEVEL (UNFRACTIONATED): HEPARIN UNFRACTIONATED: 0.78 [IU]/mL — AB (ref 0.30–0.70)

## 2018-04-26 MED ORDER — HEPARIN (PORCINE) IN NACL 100-0.45 UNIT/ML-% IJ SOLN
1300.0000 [IU]/h | INTRAMUSCULAR | Status: DC
  Administered 2018-04-26: 1300 [IU]/h via INTRAVENOUS
  Filled 2018-04-26: qty 250

## 2018-04-26 MED ORDER — HEPARIN SODIUM (PORCINE) 1000 UNIT/ML IJ SOLN
INTRAMUSCULAR | Status: AC
  Administered 2018-04-26: 1000 [IU] via INTRAVENOUS
  Filled 2018-04-26: qty 4

## 2018-04-26 MED ORDER — HEPARIN (PORCINE) IN NACL 100-0.45 UNIT/ML-% IJ SOLN: 1350.0000 [IU]/h | INTRAMUSCULAR | Status: DC

## 2018-04-26 MED ORDER — CLOPIDOGREL BISULFATE 75 MG PO TABS
75.0000 mg | ORAL_TABLET | Freq: Every day | ORAL | Status: DC
  Administered 2018-04-26 – 2018-05-06 (×11): 75 mg via ORAL
  Filled 2018-04-26 (×11): qty 1

## 2018-04-26 MED ORDER — CLOPIDOGREL BISULFATE 75 MG PO TABS: 75.0000 mg | ORAL_TABLET | Freq: Every day | ORAL | Status: DC

## 2018-04-26 MED ORDER — HEPARIN (PORCINE) IN NACL 100-0.45 UNIT/ML-% IJ SOLN
1100.0000 [IU]/h | INTRAMUSCULAR | Status: DC
  Administered 2018-04-26: 1300 [IU]/h via INTRAVENOUS
  Administered 2018-04-28 – 2018-04-29 (×2): 1100 [IU]/h via INTRAVENOUS
  Filled 2018-04-26 (×3): qty 250

## 2018-04-26 MED ORDER — VANCOMYCIN HCL IN DEXTROSE 750-5 MG/150ML-% IV SOLN
INTRAVENOUS | Status: AC
  Filled 2018-04-26: qty 150

## 2018-04-26 MED ORDER — DARBEPOETIN ALFA 60 MCG/0.3ML IJ SOSY: 200.0000 ug | PREFILLED_SYRINGE | INTRAMUSCULAR | Status: DC

## 2018-04-26 MED ORDER — VANCOMYCIN HCL IN DEXTROSE 750-5 MG/150ML-% IV SOLN
750.0000 mg | INTRAVENOUS | Status: AC
  Administered 2018-04-26: 750 mg via INTRAVENOUS
  Filled 2018-04-26: qty 150

## 2018-04-26 NOTE — Progress Notes (Signed)
Patient had 2nd bleeding episode on HD cath site.  [facility] was notified.  Called pharmacy, spoke to [place], advice to stopped Heparin for 30 minutes.  I will keep monitoring patient.

## 2018-04-26 NOTE — Progress Notes (Signed)
PROGRESS NOTE  Troy [name]  TTS:[id number] DOB: 05/19/1941 DOA: 04/18/2018 PCP: Gaynelle Arabian, MD  Brief Narrative: Troy [name] is a 77 y.o. 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 Also noted to have progressive renal failure with uremia for which nephrology was consulted and began hemodialysis 10/11 through remotely placed left RC AVF. An outpatient HD chair has been arranged.   Echocardiogram demonstrated worsening of LV dysfunction (EF 45-50% 2017 now 25-30%). Cardiology was consulted, recommending heart catheterization which the patient and family consented to after discussions with palliative care consultants. Left heart catheterization on 10/16 after vitamin K given for supratherapeutic INR showed LAD and RCA disease, though only LAD lesion was felt to be amenable to stenting. This is planned 10/18. On 10/17 he developed erythema proximal to fistula site for which vancomycin and aztreonam were started empirically.   Assessment & Plan: Principal Problem:   Acute on chronic systolic heart failure (HCC) Active Problems:   CKD (chronic kidney disease), stage V (HCC)   ESRD (end stage renal disease) (HCC)   Leukocytosis   Atypical chest pain   Paroxysmal A-fib (HCC)   NSTEMI (non-ST elevated myocardial infarction) ([place])   Myocardial infarction [facility])   Palliative care by specialist   DNR (do not resuscitate) discussion  NSTEMI: With hx abnormal stress testing Jan 2018 not followed by LHC due to CKD.  - LHC 04/23/2018 showed LAD and RCA lesions, plan for PCI to LAD next week (delayed due to inability to  cannulate AVF) with further plan based on clinical course (aggressive medical management vs. staged PCI to RCA). - Pt on heparin gtt, loaded with plavix, has continued DAPT, statin, and antihypertensives/antianginals as BP will tolerate. Per note, plan would by to continue DAPT x30 days without coumadin, then stop aspirin, continue plavix and restart coumadin.  Chronic combined HFrEF: EF worsened to 25-30%.  - Manage volume with HD.  - Otherwise on guideline directed therapy as BP allows.   Sinus bradycardia: Does not seem to be symptomatic.  - Holding beta blocker. Management per cardiology.  ESRD: CLIP process completed, has spot at [facility] MWF at 12:15pm.  - Dialysis per nephrology today.  Anemia of CKD and acute blood loss: s/p 1u PRBCs 10/11, 2u 10/12  in setting of AVF bleeding and supratherapeutic INR, now having IJ cath site bleeding.  - Monitor CBC regularly 10.9 >> 9.2, type and screen since ongoing blood loss. Heparin to continue with low therapeutic goal (0.3-0.5) - EPO per nephrology  Left arm cellulitis vs. fistula infection: WBC climbed 19 > 30, improving with antibiotics.  - Monitor blood cultures (10/14 and repeated with fever 10/17) both NGTD. - Continue empiric vancomycin, aztreonam. - Needs AVF evaluation.   Right arm edema: Unclear etiology.  - U/S on right arm additionally ordered. Acute toxic metabolic encephalopathy: Due to uremia. Resolved  Paroxysmal AFib:  - Continue amiodarone. Currently bradycardic so holding BB. - On heparin gtt, holding coumadin for now.  Supratherapeutic INR: Resolved s/p vit K. - Appreciate pharmacy assistance in dosing.   PVD, carotid stenosis: s/p CEA 2011.  - Continue ASA, statin  Hypothyroidism: Chronic,  stable.  - Continue synthroid 52mcg  DVT prophylaxis: Heparin Code Status: Full Family Communication: None at bedside this morning. Disposition Plan: SNF is recommended disposition, will be pending further investigation or  AVF and treatment of infection and cardiac evaluation.    Consultants:   Cardiology  Nephrology  Interventional radiology  Procedures:   HD   LHC 04/23/2018  Antimicrobials:  Vancomycin, aztreonam 10/17 >>   Subjective: Has had continued bleeding from right IJ cath site despite pressure and intermittently holding heparin, no stopped after biopatch and pressure. No dyspnea, chest pain, other sources of bleeding.   Objective: Vitals:   04/26/18 1545 04/26/18 1615 04/26/18 1645 04/26/18 1814  BP: (!) 99/50 (!) 103/51 (!) 117/55 136/62  Pulse: 85 75 77 95  Resp:      Temp:    98.4 F (36.9 C)  TempSrc:    Oral  SpO2:      Weight:      Height:        Intake/Output Summary (Last 24 hours) at 04/26/2018 1916 Last data filed at 04/26/2018 0858 Gross per 24 hour  Intake 489.5 ml  Output -  Net 489.5 ml   Filed Weights   04/24/18 0529 04/25/18 0648 04/26/18 0457  Weight: 77.7 kg 75 kg 79.5 kg   Gen: Pleasant, elderly male in no distress Pulm: Nonlabored breathing room air. Clear. CV: Regular bradycardia. Flow murmur, rub, or gallop. No JVD, trace dependent edema. GI: Abdomen soft, non-tender, non-distended, with normoactive bowel sounds.  Ext: Warm, no deformities. Bilateral arms R > L edematous.  Skin: Right IJ site with hemostatic dressing, no bleeding noted, no tenderness. Erythema stable on left arm, tender.  Neuro: Alert and oriented. No focal neurological deficits. Psych: Judgement and insight appear fair. Mood euthymic & affect congruent. Behavior is appropriate.     04/24/2018:   04/25/2018:   Data Reviewed: I have personally reviewed following labs and imaging studies  CBC: Recent Labs  Lab 04/22/18 0615 04/23/18 0339 04/24/18 0231 04/25/18 0418 04/26/18 0541  WBC 17.3* 19.5* 30.6* 20.8* 18.0*  NEUTROABS 15.7*  --   --   --   --   HGB 10.9* 10.1* 10.3* 9.8* 9.2*  HCT 34.0* 32.2* 32.3* 31.3* 28.9*  MCV 95.5 97.3 97.6 98.1 97.0  PLT 226 221  227 236 109   Basic Metabolic Panel: Recent Labs  Lab 04/21/18 0403 04/23/18 0339 04/24/18 0231 04/25/18 0418 04/26/18 0541  NA 134* 132* 134* 134* 135  K 3.8 4.0 3.9 3.9 4.0  CL 97* 97* 97* 96* 98  CO2 21* 21* 24 21* 19*  GLUCOSE 116* 113* 85 81 70  BUN 94* 78* 40* 66* 81*  CREATININE 7.69* 7.46* 5.25* 7.09* 7.97*  CALCIUM 7.6* 7.5* 7.3* 7.4* 7.1*  PHOS  --   --  6.3* 7.4* 8.6*   GFR: Estimated Creatinine Clearance: 7.7 mL/min (A) (by C-G formula based on SCr of 7.97 mg/dL (H)). Liver Function Tests: Recent Labs  Lab 04/24/18 0231 04/25/18 0418 04/26/18 0541  ALBUMIN 2.3* 2.3* 2.1*   No results for input(s): LIPASE, AMYLASE in the last 168 hours. No results for input(s): AMMONIA in the last 168 hours. Coagulation Profile: Recent Labs  Lab 04/20/18 0915 04/20/18 1229 04/21/18 0403 04/25/18 1108  INR 2.21 1.96 1.43 1.51   Cardiac Enzymes: No results for input(s): CKTOTAL, CKMB, CKMBINDEX, TROPONINI in the last 168 hours. BNP (last 3 results) No results for input(s): PROBNP in the last 8760 hours. HbA1C: No results for  input(s): HGBA1C in the last 72 hours. CBG: No results for input(s): GLUCAP in the last 168 hours. Lipid Profile: No results for input(s): CHOL, HDL, LDLCALC, TRIG, CHOLHDL, LDLDIRECT in the last 72 hours. Thyroid Function Tests: No results for input(s): TSH, T4TOTAL, FREET4, T3FREE, THYROIDAB in the last 72 hours. Anemia Panel: No results for input(s): VITAMINB12, FOLATE, FERRITIN, TIBC, IRON, RETICCTPCT in the last 72 hours. Urine analysis:    Component Value Date/Time   COLORURINE YELLOW 06/05/2016 0328   APPEARANCEUR CLEAR 06/05/2016 0328   LABSPEC 1.010 06/05/2016 0328   PHURINE 5.0 06/05/2016 0328   GLUCOSEU NEGATIVE 06/05/2016 0328   HGBUR TRACE (A) 06/05/2016 0328   BILIRUBINUR NEGATIVE 06/05/2016 0328   KETONESUR NEGATIVE 06/05/2016 0328   PROTEINUR NEGATIVE 06/05/2016 0328   UROBILINOGEN 0.2 02/15/2010 0845   NITRITE NEGATIVE  06/05/2016 0328   LEUKOCYTESUR NEGATIVE 06/05/2016 0328   Recent Results (from the past 240 hour(s))  MRSA PCR Screening     Status: None   Collection Time: 04/18/18 10:00 PM  Result Value Ref Range Status   MRSA by PCR NEGATIVE NEGATIVE Final    Comment:        The GeneXpert MRSA Assay (FDA approved for NASAL specimens only), is one component of a comprehensive MRSA colonization surveillance program. It is not intended to diagnose MRSA infection nor to guide or monitor treatment for MRSA infections. Performed at [facility], [place] [address], [place], [place] [address]   Culture, blood (Routine X 2) w Reflex to ID Panel     Status: None   Collection Time: 04/21/18 12:08 PM  Result Value Ref Range Status   Specimen Description BLOOD RIGHT HAND  Final   Special Requests   Final    BOTTLES DRAWN AEROBIC ONLY Blood Culture adequate volume   Culture   Final    NO GROWTH 5 DAYS Performed at [facility], [place] [address] [place], [place] [address]    Report Status 04/26/2018 FINAL  Final  Culture, blood (Routine X 2) w Reflex to ID Panel     Status: None   Collection Time: 04/21/18 12:12 PM  Result Value Ref Range Status   Specimen Description BLOOD RIGHT HAND  Final   Special Requests   Final    BOTTLES DRAWN AEROBIC ONLY Blood Culture adequate volume   Culture   Final    NO GROWTH 5 DAYS Performed at [facility], [place] [address] [place], [place] [address]    Report Status 04/26/2018 FINAL  Final  Culture, blood (routine x 2)     Status: None (Preliminary result)   Collection Time: 04/24/18  6:00 AM  Result Value Ref Range Status   Specimen Description BLOOD RIGHT ANTECUBITAL  Final   Special Requests   Final    BOTTLES DRAWN AEROBIC AND ANAEROBIC Blood Culture adequate volume   Culture   Final    NO GROWTH 2 DAYS Performed at [facility], [place] [address], [place], [place] [address]    Report Status PENDING  Incomplete  Culture, blood (routine x  2)     Status: None (Preliminary result)   Collection Time: 04/24/18  6:15 AM  Result Value Ref Range Status   Specimen Description BLOOD RIGHT HAND  Final   Special Requests   Final    BOTTLES DRAWN AEROBIC AND ANAEROBIC Blood Culture adequate volume   Culture   Final    NO GROWTH 2 DAYS Performed at [facility], [place] [address], [place], [place] [address]    Report Status PENDING  Incomplete      Radiology Studies: No results found.  Scheduled Meds: . amiodarone  200 mg Oral Daily  . aspirin  81 mg Oral Pre-Cath  . aspirin EC  81 mg Oral Daily  . atorvastatin  20 mg Oral QHS  . Chlorhexidine Gluconate Cloth  6 each Topical Q0600  . clopidogrel  75 mg Oral Daily  . [START ON 04/28/2018] Darbepoetin Alfa  200 mcg Intravenous Q Mon-HD  . feeding supplement  1 Container Oral BID BM  . feeding supplement (PRO-STAT SUGAR FREE 64)  30 mL Oral Daily  . levothyroxine  50 mcg Oral QAC breakfast  . lidocaine-prilocaine   Topical Q M,W,F  . multivitamin  1 tablet Oral QHS  . sodium chloride flush  3 mL Intravenous Q12H  . sodium chloride flush  3 mL Intravenous Q12H   Continuous Infusions: . sodium chloride    . sodium chloride    . sodium chloride 10 mL/hr at 04/25/18 0635  . aztreonam 0.5 g (04/26/18 1008)  . dextrose Stopped (04/23/18 1100)  . ferric gluconate (FERRLECIT/NULECIT) IV Stopped (04/23/18 1632)  . heparin 1,300 Units/hr (04/26/18 1222)  . Vancomycin    . [START ON 04/28/2018] vancomycin       LOS: 8 days   Time spent: 35 minutes.  Patrecia Pour, MD [facility] [email or web address] Password [email or web address] 04/26/2018, 7:16 PM

## 2018-04-26 NOTE — Progress Notes (Addendum)
ANTICOAGULATION CONSULT NOTE - Follow Up Consult  Pharmacy Consult for heparin Indication: Afib/CAD  Labs: Recent Labs    04/23/18 0339  04/24/18 0231 04/24/18 1120 04/24/18 1837 04/25/18 0418 04/25/18 1108  HGB 10.1*  --  10.3*  --   --  9.8*  --   HCT 32.2*  --  32.3*  --   --  31.3*  --   PLT 221  --  227  --   --  236  --   LABPROT  --   --   --   --   --   --  18.0*  INR  --   --   --   --   --   --  1.51  HEPARINUNFRC  --    < > 0.17* 0.30 0.22* 0.42  --   CREATININE 7.46*  --  5.25*  --   --  7.09*  --    < > = values in this interval not displayed.    Assessment/Plan:  RN reports pt is bleeding from HD cath placed today, IVRN holding pressure after soaking through dressing.  Will hold heparin gtt x22min.  Wynona Neat, PharmD, BCPS  04/26/2018,12:07 AM   Addendum: RN now reports that holding heparin earlier did work and pt stopped bleeding, but now bleeding has resumed.  Will hold heparin gtt x62min again and resume at slightly lower rate of 1300 units/hr and check level in 8hr.  VB 2:34 AM

## 2018-04-26 NOTE — Progress Notes (Addendum)
ANTICOAGULATION CONSULT NOTE - Follow Up Consult  Pharmacy Consult for heparin Indication: Afib/CAD  Labs: Recent Labs    04/24/18 0231 04/24/18 1120 04/24/18 1837 04/25/18 0418 04/25/18 1108 04/26/18 0541  HGB 10.3*  --   --  9.8*  --  9.2*  HCT 32.3*  --   --  31.3*  --  28.9*  PLT 227  --   --  236  --  240  LABPROT  --   --   --   --  18.0*  --   INR  --   --   --   --  1.51  --   HEPARINUNFRC 0.17* 0.30 0.22* 0.42  --   --   CREATININE 5.25*  --   --  7.09*  --  7.97*    Assessment/Plan:  77 y.o. M presents with STEMI. Pt was on coumadin PTA with admission INR 4.56 (reversed with vit K 5mg ) - down to 1.43. S/p cath 10/16 which shows severe 2v CAD. Considering PCI with impella support vs medical management. Heparin restarted post cath.   Bleeding has occurred on and off over last night, requiring hold heparin infusion. This morning, bleeding continues and order placed per IR to hold heparin infusion until bleed stabilizes. Given continued bleeding, will reduce goal range.   Goal of Therapy:  Heparin level 0.3-0.5 units/ml Monitor platelets by anticoagulation protocol: Yes   Plan:  -Continue to hold heparin infusion for 1 hour -As long as bleeding stabilized, restart heparin at 1300 units/hr at 1200 and obtain heparin level 8 hours after restart -Monitor daily HL, CBC, and for s/sx of bleeding  Doylene Canard, PharmD Clinical Pharmacist  Pager: [phone] Phone: [phone]  ADDENDUM Bleeding again at HD fistula - currently have pressure dressing applied. Heparin level came back supratherapeutic at 0.78, on 1300 units/hr. Reduce heparin infusion to 1100 units/hr and obtain heparin level with morning labs. Continue to monitor for worsening bleeding.   Doylene Canard, PharmD Clinical Pharmacist

## 2018-04-26 NOTE — Progress Notes (Signed)
Progress Note  Patient Name: Troy [name] Date of Encounter: 04/26/2018  Primary Cardiologist: Kirk Ruths, MD   Subjective   No chest pain concerned about bleeding from dialysis catheter and RUE swelling   Inpatient Medications    Scheduled Meds: . amiodarone  200 mg Oral Daily  . aspirin  81 mg Oral Pre-Cath  . aspirin EC  81 mg Oral Daily  . atorvastatin  20 mg Oral QHS  . Chlorhexidine Gluconate Cloth  6 each Topical Q0600  . clopidogrel  75 mg Oral Daily  . Darbepoetin Alfa  200 mcg Intravenous Q Fri-HD  . feeding supplement  1 Container Oral BID BM  . feeding supplement (PRO-STAT SUGAR FREE 64)  30 mL Oral Daily  . levothyroxine  50 mcg Oral QAC breakfast  . lidocaine-prilocaine   Topical Q M,W,F  . multivitamin  1 tablet Oral QHS  . sodium chloride flush  3 mL Intravenous Q12H  . sodium chloride flush  3 mL Intravenous Q12H   Continuous Infusions: . sodium chloride    . sodium chloride    . sodium chloride 10 mL/hr at 04/25/18 0635  . aztreonam 0.5 g (04/25/18 2341)  . dextrose Stopped (04/23/18 1100)  . ferric gluconate (FERRLECIT/NULECIT) IV Stopped (04/23/18 1632)  . heparin 1,300 Units/hr (04/26/18 0932)  . [START ON 04/28/2018] vancomycin     PRN Meds: sodium chloride, sodium chloride, acetaminophen, heparin, lidocaine, nitroGLYCERIN, ondansetron **OR** ondansetron (ZOFRAN) IV, sodium chloride flush, sodium chloride flush, zolpidem   Vital Signs    Vitals:   04/25/18 1300 04/25/18 1653 04/25/18 2113 04/26/18 0457  BP:  (!) 132/57 (!) 110/50 (!) 128/54  Pulse:  (!) 50 (!) 52 (!) 52  Resp:  16 16 19   Temp: (!) 97.4 F (36.3 C) 98.1 F (36.7 C) 97.7 F (36.5 C) 97.6 F (36.4 C)  TempSrc: Axillary Oral Oral Oral  SpO2: 98% 96% 97% 93%  Weight:    79.5 kg  Height:        Intake/Output Summary (Last 24 hours) at 04/26/2018 0809 Last data filed at 04/26/2018 0438 Gross per 24 hour  Intake 568.18 ml  Output -  Net 568.18 ml   Filed  Weights   04/24/18 0529 04/25/18 0648 04/26/18 0457  Weight: 77.7 kg 75 kg 79.5 kg    Physical Exam   BP (!) 128/54 (BP Location: Right Arm)   Pulse (!) 52   Temp 97.6 F (36.4 C) (Oral)   Resp 19   Ht 5\' 6"  (1.676 m)   Wt 79.5 kg   SpO2 93%   BMI 28.29 kg/m  Affect appropriate Chronically ill white male  HEENT: normal Neck supple with no adenopathy JVP right subclavian catheter oozing from skin  Lungs clear with no wheezing and good diaphragmatic motion Heart:  S1/S2 SEM murmur, no rub, gallop or click PMI normal Abdomen: benighn, BS positve, no tenderness, no AAA no bruit.  No HSM or HJR Distal pulses intact with no bruits No edema Neuro non-focal Skin warm and dry No muscular weakness RUE swollen and tender Fistula with dressing LUE   Labs    Chemistry Recent Labs  Lab 04/24/18 0231 04/25/18 0418 04/26/18 0541  NA 134* 134* 135  K 3.9 3.9 4.0  CL 97* 96* 98  CO2 24 21* 19*  GLUCOSE 85 81 70  BUN 40* 66* 81*  CREATININE 5.25* 7.09* 7.97*  CALCIUM 7.3* 7.4* 7.1*  ALBUMIN 2.3* 2.3* 2.1*  GFRNONAA 9* 7* 6*  GFRAA 11* 8* 7*  ANIONGAP 13 17* 18*     Hematology Recent Labs  Lab 04/24/18 0231 04/25/18 0418 04/26/18 0541  WBC 30.6* 20.8* 18.0*  RBC 3.31* 3.19* 2.98*  HGB 10.3* 9.8* 9.2*  HCT 32.3* 31.3* 28.9*  MCV 97.6 98.1 97.0  MCH 31.1 30.7 30.9  MCHC 31.9 31.3 31.8  RDW 19.9* 19.3* 18.8*  PLT 227 236 240    Cardiac Enzymes Recent Labs  Lab 04/19/18 1216  TROPONINI 7.93*    No results for input(s): TROPIPOC in the last 168 hours.   BNP No results for input(s): BNP, PROBNP in the last 168 hours.   Patient Profile     77 y.o. male admitted with non-ST elevation myocardial infarction.  Also with history of chronic kidney disease and dialysis has now been initiated.  Also with history of paroxysmal atrial fibrillation, chronic combined systolic/diastolic congestive heart failure, hypertension, hyperlipidemia, prior CVA, peripheral  vascular disease.  Has required transfusion this admission for bleeding from AV fistula.  Echocardiogram shows newly reduced LV function with ejection fraction 25 to 30%.  There is moderate mitral regurgitation.  Assessment & Plan    1 non-ST elevation myocardial infarction-as outlined in previous notes patient has severe right coronary artery disease and LAD disease.  Plan is to proceed with PCI of LAD but postponed until Tuesday due to dialysis and access issues    2 ischemic CM-beta-blocker has been discontinued because of bradycardia.  Blood pressure is borderline not on nitrates Or hydralazine at this point   3 acute on chronic kidney disease-? Bleeding from qualitative platelet defect need to have fistuala, bleeding from new Access catheter and RUE swelling improved before any intervention is considered Hct is down this am from oozing catheter sight   4 paroxysmal atrial fibrillation-continue amiodarone to help maintain sinus rhythm.  Given age and overall frail body habitus would plan aspirin and Plavix for 30 days following PCI with no Coumadin.  After 30 days to discontinue aspirin, continue Plavix and resume Coumadin at that time.  5 acute systolic congestive heart failure-volume removal per dialysis.  6 elevated white blood cell count-appears to have potential infection of AV fistula/left upper extremity.  Antibiotics initiated.  Per internal medicine. Blood cultures negative to date.  For questions or updates, please contact [place] Please consult [email or web address] for contact info under        Signed, Jenkins Rouge, MD  04/26/2018, 8:09 AM

## 2018-04-26 NOTE — Progress Notes (Signed)
  Called re: bleeding around temp cath site.  Patient confirms "they pushed on it off and on all night"  I have held heparin for at least an hour for now. Will not hold Plavix given cardiac issues.  Pressure held x 15 minutes.   Watched site x 10 minutes with no signs of bleeding.  New biopatch and clean dressings placed.  Hold heparin x 1 hour. Start back at lowest previous rate.  If heparin needs to be held due to bleeding ANY longer than this, need to confirm with cardiology that this is OK.   WENDY S BLAIR PA-C 04/26/2018 10:32 AM

## 2018-04-26 NOTE — Progress Notes (Signed)
Patient had bleeding episode at his HD cath area, dressing was completely saturated, and blood ran through his back shoulder blade.   IV team was called to replace dressing. I will keep monitoring patient.

## 2018-04-26 NOTE — Progress Notes (Signed)
Subjective: Interval History: has complaints R arm swollen from blood draw, bleeding around temp HD cath, dis not get dialysis last pm. .  Objective: Vital signs in last 24 hours: Temp:  [97.4 F (36.3 C)-98.9 F (37.2 C)] 98.9 F (37.2 C) (10/19 0857) Pulse Rate:  [50-52] 52 (10/19 0457) Resp:  [16-19] 19 (10/19 0457) BP: (110-139)/(44-65) 139/65 (10/19 0857) SpO2:  [93 %-98 %] 93 % (10/19 0857) Weight:  [79.5 kg] 79.5 kg (10/19 0457) Weight change: 4.5 kg  Intake/Output from previous day: 10/18 0701 - 10/19 0700 In: 568.2 [P.O.:190; I.V.:328.2; IV Piggyback:50] Out: -476  Intake/Output this shift: Total I/O In: 120 [P.O.:120] Out: -   General appearance: cooperative, no distress and slowed mentation Neck: RIJ cath Resp: diminished breath sounds bilaterally and rhonchi bibasilar Cardio: S1, S2 normal and systolic murmur: systolic ejection 2/6, crescendo and decrescendo at 2nd left intercostal space GI: soft, non-tender; bowel sounds normal; no masses,  no organomegaly Extremities: edema 1-2+ and L FA AVF B&T mod swelling and bruising from infilt.  R FA swollen and bruised with leak from anteccub blood draw.  Lab Results: Recent Labs    04/25/18 0418 04/26/18 0541  WBC 20.8* 18.0*  HGB 9.8* 9.2*  HCT 31.3* 28.9*  PLT 236 240   BMET:  Recent Labs    04/25/18 0418 04/26/18 0541  NA 134* 135  K 3.9 4.0  CL 96* 98  CO2 21* 19*  GLUCOSE 81 70  BUN 66* 81*  CREATININE 7.09* 7.97*  CALCIUM 7.4* 7.1*   No results for input(s): PTH in the last 72 hours. Iron Studies: No results for input(s): IRON, TIBC, TRANSFERRIN, FERRITIN in the last 72 hours.  Studies/Results: Dg Chest 2 View  Result Date: 04/24/2018 CLINICAL DATA:  CHF. EXAM: CHEST - 2 VIEW COMPARISON:  11/09/2017. FINDINGS: Cardiomegaly with diffuse bilateral interstitial prominence and bilateral pleural effusions suggesting CHF. Bibasilar atelectasis. Degenerative change thoracic spine. Old healed right  fifth rib fracture. IMPRESSION: Cardiomegaly with diffuse bilateral from interstitial prominence and bilateral pleural effusions suggesting CHF. Similar findings noted on prior exam. Electronically Signed   By: [place]   On: 04/24/2018 12:52    I have reviewed the patient's current medications.  Assessment/Plan: 1 ESRD for HD, for some reason not done last night despite communication to do him , with charge nurse. 2 CAD per cards 3 oozing from HD cath site needs purse string 4 Anemia lower use esa/Fe 5 HPTH vit D 6 AVF will need to rest and shuntogram 7 Debill 8 PAF P Hd, esa. Stitch cath , VVS to see.     LOS: 8 days   Jeneen Rinks Troy [name] 04/26/2018,9:11 AM

## 2018-04-26 NOTE — Progress Notes (Signed)
Patient still actively bleeding, small amounts on HD cath site, dressing was already reinforced.  MD was notified.  I will keep monitoring patient.

## 2018-04-26 NOTE — Progress Notes (Addendum)
Pt's. Previous HD dressing soaked with blood, previous biopatch with 1 pc of 4x4 gauze saturated. Dressing changed with surgicel.. New dressing reinforced w/ 1 pc 4x4 gauze. Told by RN that heparin will be held x74mins. dressing endorsed to RN at bedside, dry , clean and intact at this  Moment.

## 2018-04-27 ENCOUNTER — Inpatient Hospital Stay (HOSPITAL_COMMUNITY): Payer: Medicare Other

## 2018-04-27 DIAGNOSIS — T82898A Other specified complication of vascular prosthetic devices, implants and grafts, initial encounter: Secondary | ICD-10-CM

## 2018-04-27 DIAGNOSIS — M7989 Other specified soft tissue disorders: Secondary | ICD-10-CM

## 2018-04-27 DIAGNOSIS — T82590A Other mechanical complication of surgically created arteriovenous fistula, initial encounter: Secondary | ICD-10-CM

## 2018-04-27 DIAGNOSIS — Z992 Dependence on renal dialysis: Secondary | ICD-10-CM

## 2018-04-27 DIAGNOSIS — N185 Chronic kidney disease, stage 5: Secondary | ICD-10-CM

## 2018-04-27 DIAGNOSIS — N186 End stage renal disease: Secondary | ICD-10-CM

## 2018-04-27 LAB — HEPARIN LEVEL (UNFRACTIONATED)
Heparin Unfractionated: 0.46 IU/mL (ref 0.30–0.70)
Heparin Unfractionated: 1.06 IU/mL — ABNORMAL HIGH (ref 0.30–0.70)
Heparin Unfractionated: 0.3 IU/mL (ref 0.30–0.70)

## 2018-04-27 LAB — CBC
HEMATOCRIT: 29.4 % — AB (ref 39.0–52.0)
HEMATOCRIT: 29.7 % — AB (ref 39.0–52.0)
HEMOGLOBIN: 9.3 g/dL — AB (ref 13.0–17.0)
Hemoglobin: 9.3 g/dL — ABNORMAL LOW (ref 13.0–17.0)
MCH: 30.9 pg (ref 26.0–34.0)
MCH: 30.8 pg (ref 26.0–34.0)
MCHC: 31.6 g/dL (ref 30.0–36.0)
MCHC: 31.3 g/dL (ref 30.0–36.0)
MCV: 97.7 fL (ref 80.0–100.0)
MCV: 98.3 fL (ref 80.0–100.0)
PLATELETS: 245 10*3/uL (ref 150–400)
Platelets: 231 10*3/uL (ref 150–400)
RBC: 3.01 MIL/uL — ABNORMAL LOW (ref 4.22–5.81)
RBC: 3.02 MIL/uL — AB (ref 4.22–5.81)
RDW: 19.1 % — AB (ref 11.5–15.5)
RDW: 19 % — ABNORMAL HIGH (ref 11.5–15.5)
WBC: 14.8 10*3/uL — ABNORMAL HIGH (ref 4.0–10.5)
WBC: 14.8 10*3/uL — AB (ref 4.0–10.5)
nRBC: 0 % (ref 0.0–0.2)
nRBC: 0 % (ref 0.0–0.2)

## 2018-04-27 LAB — RENAL FUNCTION PANEL
Albumin: 2.2 g/dL — ABNORMAL LOW (ref 3.5–5.0)
Anion gap: 11 (ref 5–15)
BUN: 25 mg/dL — AB (ref 8–23)
CHLORIDE: 99 mmol/L (ref 98–111)
CO2: 26 mmol/L (ref 22–32)
CREATININE: 3.9 mg/dL — AB (ref 0.61–1.24)
Calcium: 7.3 mg/dL — ABNORMAL LOW (ref 8.9–10.3)
GFR calc Af Amer: 16 mL/min — ABNORMAL LOW (ref >60)
GFR calc non Af Amer: 14 mL/min — ABNORMAL LOW (ref >60)
GLUCOSE: 88 mg/dL (ref 70–99)
POTASSIUM: 3.4 mmol/L — AB (ref 3.5–5.1)
Phosphorus: 3.9 mg/dL (ref 2.5–4.6)
Sodium: 136 mmol/L (ref 135–145)

## 2018-04-27 LAB — TYPE AND SCREEN
ABO/RH(D): B POS
Antibody Screen: NEGATIVE

## 2018-04-27 MED ORDER — DARBEPOETIN ALFA 60 MCG/0.3ML IJ SOSY
200.0000 ug | PREFILLED_SYRINGE | INTRAMUSCULAR | Status: DC
  Filled 2018-04-27: qty 1.2

## 2018-04-27 MED ORDER — VANCOMYCIN HCL IN DEXTROSE 750-5 MG/150ML-% IV SOLN: 750.0000 mg | INTRAVENOUS | Status: DC

## 2018-04-27 MED ORDER — DARBEPOETIN ALFA 60 MCG/0.3ML IJ SOSY: 200.0000 ug | PREFILLED_SYRINGE | INTRAMUSCULAR | Status: DC

## 2018-04-27 MED ORDER — ZOLPIDEM TARTRATE 5 MG PO TABS
5.0000 mg | ORAL_TABLET | Freq: Once | ORAL | Status: AC
  Administered 2018-04-27: 5 mg via ORAL
  Filled 2018-04-27: qty 1

## 2018-04-27 NOTE — Progress Notes (Signed)
Progress Note  Patient Name: Troy [name] Date of Encounter: 04/27/2018  Primary Cardiologist: Kirk Ruths, MD   Subjective   No chest pain  Still concerned about extravasation and hematoma from blood draw RUE  Inpatient Medications    Scheduled Meds: . amiodarone  200 mg Oral Daily  . aspirin EC  81 mg Oral Daily  . atorvastatin  20 mg Oral QHS  . Chlorhexidine Gluconate Cloth  6 each Topical Q0600  . clopidogrel  75 mg Oral Daily  . [START ON 04/28/2018] Darbepoetin Alfa  200 mcg Intravenous Q Mon-HD  . feeding supplement  1 Container Oral BID BM  . feeding supplement (PRO-STAT SUGAR FREE 64)  30 mL Oral Daily  . levothyroxine  50 mcg Oral QAC breakfast  . lidocaine-prilocaine   Topical Q M,W,F  . multivitamin  1 tablet Oral QHS  . sodium chloride flush  3 mL Intravenous Q12H  . sodium chloride flush  3 mL Intravenous Q12H   Continuous Infusions: . sodium chloride    . sodium chloride    . sodium chloride 10 mL/hr at 04/25/18 0635  . aztreonam Stopped (04/27/18 0046)  . dextrose Stopped (04/23/18 1100)  . ferric gluconate (FERRLECIT/NULECIT) IV Stopped (04/23/18 1632)  . heparin 1,100 Units/hr (04/27/18 0600)  . [START ON 04/28/2018] vancomycin     PRN Meds: sodium chloride, sodium chloride, acetaminophen, heparin, lidocaine, nitroGLYCERIN, ondansetron **OR** ondansetron (ZOFRAN) IV, sodium chloride flush, sodium chloride flush   Vital Signs    Vitals:   04/26/18 1956 04/27/18 0004 04/27/18 0407 04/27/18 0823  BP: 127/72 130/62 (!) 146/55 (!) 173/55  Pulse: 80 80 64   Resp: 19 19 20    Temp: 97.8 F (36.6 C) 99.1 F (37.3 C) 98.2 F (36.8 C) 98.6 F (37 C)  TempSrc: Oral Oral Oral Oral  SpO2: 96% 96% 95% 94%  Weight:   77.1 kg   Height:        Intake/Output Summary (Last 24 hours) at 04/27/2018 0912 Last data filed at 04/27/2018 0600 Gross per 24 hour  Intake 539.71 ml  Output 1527 ml  Net -987.29 ml   Filed Weights   04/26/18 0457 04/26/18  1814 04/27/18 0407  Weight: 79.5 kg 77.5 kg 77.1 kg    Physical Exam   BP (!) 173/55 (BP Location: Left Leg)   Pulse 64   Temp 98.6 F (37 C) (Oral)   Resp 20   Ht 5\' 6"  (1.676 m)   Wt 77.1 kg   SpO2 94%   BMI 27.43 kg/m  Affect appropriate Chronically ill white male  HEENT: normal Neck supple with no adenopathy JVP right subclavian catheter oozing from skin  Lungs clear with no wheezing and good diaphragmatic motion Heart:  S1/S2 SEM murmur, no rub, gallop or click PMI normal Abdomen: benighn, BS positve, no tenderness, no AAA no bruit.  No HSM or HJR Distal pulses intact with no bruits No edema Neuro non-focal Skin warm and dry No muscular weakness RUE swollen and tender Fistula with dressing LUE   Labs    Chemistry Recent Labs  Lab 04/25/18 0418 04/26/18 0541 04/27/18 0450  NA 134* 135 136  K 3.9 4.0 3.4*  CL 96* 98 99  CO2 21* 19* 26  GLUCOSE 81 70 88  BUN 66* 81* 25*  CREATININE 7.09* 7.97* 3.90*  CALCIUM 7.4* 7.1* 7.3*  ALBUMIN 2.3* 2.1* 2.2*  GFRNONAA 7* 6* 14*  GFRAA 8* 7* 16*  ANIONGAP 17* 18* 11  Hematology Recent Labs  Lab 04/25/18 0418 04/26/18 0541 04/27/18 0450  WBC 20.8* 18.0* 14.8*  RBC 3.19* 2.98* 3.01*  HGB 9.8* 9.2* 9.3*  HCT 31.3* 28.9* 29.4*  MCV 98.1 97.0 97.7  MCH 30.7 30.9 30.9  MCHC 31.3 31.8 31.6  RDW 19.3* 18.8* 19.1*  PLT 236 240 245    Cardiac Enzymes No results for input(s): TROPONINI in the last 168 hours.  No results for input(s): TROPIPOC in the last 168 hours.   BNP No results for input(s): BNP, PROBNP in the last 168 hours.   Patient Profile     77 y.o. male admitted with non-ST elevation myocardial infarction.  Also with history of chronic kidney disease and dialysis has now been initiated.  Also with history of paroxysmal atrial fibrillation, chronic combined systolic/diastolic congestive heart failure, hypertension, hyperlipidemia, prior CVA, peripheral vascular disease.  Has required  transfusion this admission for bleeding from AV fistula.  Echocardiogram shows newly reduced LV function with ejection fraction 25 to 30%.  There is moderate mitral regurgitation.  Assessment & Plan    1 non-ST elevation myocardial infarction-as outlined in previous notes patient has severe right coronary artery disease and LAD disease.  Plan is to proceed with PCI of LAD but postponed until Tuesday due to dialysis and access issues    2 ischemic CM-beta-blocker has been discontinued because of bradycardia.  Blood pressure is borderline not on nitrates Or hydralazine at this point   3 acute on chronic kidney disease-? Bleeding from qualitative platelet defect need to have fistuala, bleeding from new Access catheter and RUE swelling improved before any intervention is considered Hct is down this am from oozing catheter sight  Had dialysis yesterday Dr Deterding's note indicates purse string suture needed for dialysis catheter but just seems taped at this point   4 paroxysmal atrial fibrillation-continue amiodarone to help maintain sinus rhythm.  Given age and overall frail body habitus would plan aspirin and Plavix for 30 days following PCI with no Coumadin.  After 30 days to discontinue aspirin, continue Plavix and resume Coumadin at that time.  5 acute systolic congestive heart failure-volume removal per dialysis.  6 elevated white blood cell count-appears to have potential infection of AV fistula/left upper extremity.  Antibiotics initiated.  Per internal medicine. Blood cultures negative to date.  For questions or updates, please contact [place] Please consult [email or web address] for contact info under        Signed, Jenkins Rouge, MD  04/27/2018, 9:12 AM

## 2018-04-27 NOTE — Progress Notes (Signed)
Referring Physician(s): Dr. Jeneen Rinks Deterding  Supervising Physician: Corrie Mckusick  Patient Status:  [facility] - In-pt  Chief Complaint:  Bleeding a cath site  Subjective:  Doing well. No complaints.  Allergies: Penicillins; Tetanus toxoids; and Sulfa antibiotics  Medications: Prior to Admission medications   Medication Sig Start Date End Date Taking? Authorizing Provider  allopurinol (ZYLOPRIM) 100 MG tablet Take 100 mg by mouth daily.  07/27/13  Yes [provider]  amiodarone (PACERONE) 200 MG tablet Take 1 tablet (200 mg total) by mouth daily. 10/28/17  Yes Lelon Perla, MD  amLODipine (NORVASC) 5 MG tablet Take 5 mg by mouth at bedtime.  11/05/10  Yes [provider]  Ascorbic Acid (VITAMIN C PO) Take 1 tablet by mouth every other day.   Yes [provider]  atorvastatin (LIPITOR) 40 MG tablet Take 0.5 tablets (20 mg total) by mouth at bedtime. 11/28/16  Yes Lelon Perla, MD  Cholecalciferol (VITAMIN D PO) Take 1 tablet by mouth daily.   Yes [provider]  Cyanocobalamin (VITAMIN B-12 PO) Take 1 tablet by mouth 2 (two) times daily.   Yes [provider]  epoetin alfa (EPOGEN,PROCRIT) 33825 UNIT/ML injection Inject 20,000 Units into the skin every 14 (fourteen) days.   Yes [provider]  isosorbide mononitrate (IMDUR) 30 MG 24 hr tablet TAKE 1 TABLET(30 MG) BY MOUTH DAILY Patient taking differently: Take 30 mg by mouth daily.  11/11/17  Yes Almyra Deforest, PA  levothyroxine (SYNTHROID, LEVOTHROID) 50 MCG tablet Take 1 tablet (50 mcg total) by mouth daily before breakfast. 07/24/17  Yes Crenshaw, Denice Bors, MD  nitroGLYCERIN (NITROSTAT) 0.4 MG SL tablet PLACE 1 TABLET UNDER THE TONGUE EVERY 5 MINUTES AS NEEDED FOR CHEST PAIN. MAX OF 3 DOSES Patient taking differently: Place 0.4 mg under the tongue every 5 (five) minutes as needed.  08/28/16  Yes Lelon Perla, MD  polyethylene glycol (MIRALAX / GLYCOLAX) packet Take 17 g by  mouth daily.   Yes [provider]  sertraline (ZOLOFT) 100 MG tablet Take 100 mg by mouth daily.   Yes [provider]  traMADol (ULTRAM) 50 MG tablet Take 50 mg by mouth every 6 (six) hours as needed for pain. 04/07/18  Yes [provider]  triamcinolone cream (KENALOG) 0.1 % Apply 1 application topically 2 (two) times daily as needed (dry skin).   Yes [provider]  warfarin (COUMADIN) 5 MG tablet Take 0.5-1 tablets (2.5-5 mg total) by mouth See admin instructions. 03/18/18  Yes Lelon Perla, MD  hydrALAZINE (APRESOLINE) 50 MG tablet TAKE 3 TABLETS BY MOUTH ONCE EVERY 8 HOURS Patient not taking: Reported on 04/18/2018 01/08/17   Lelon Perla, MD  levothyroxine (SYNTHROID, LEVOTHROID) 25 MCG tablet TAKE 1 TABLET(25 MCG) BY MOUTH DAILY BEFORE BREAKFAST Patient not taking: Reported on 04/18/2018 03/17/18   Lelon Perla, MD     Vital Signs: BP (!) 173/55 (BP Location: Left Leg)   Pulse 64   Temp 98.6 F (37 C) (Oral)   Resp 20   Ht 5\' 6"  (1.676 m)   Wt 77.1 kg   SpO2 94%   BMI 27.43 kg/m   Physical Exam Awake and alert Right IJ temp cath still with evidence of oozing. There is visible blood under the tegaderm, but there is nothing seeping out from around it. It appears there is only a small ooze and no rapid bleeding is seen. The patients gown and pillow are clean, no  blood.  Imaging: Dg Chest 2 View  Result Date: 04/24/2018 CLINICAL DATA:  CHF. EXAM: CHEST - 2 VIEW COMPARISON:  11/09/2017. FINDINGS: Cardiomegaly with diffuse bilateral interstitial prominence and bilateral pleural effusions suggesting CHF. Bibasilar atelectasis. Degenerative change thoracic spine. Old healed right fifth rib fracture. IMPRESSION: Cardiomegaly with diffuse bilateral from interstitial prominence and bilateral pleural effusions suggesting CHF. Similar findings noted on prior exam. Electronically Signed   By: [place]   On: 04/24/2018 12:52     Labs:  CBC: Recent Labs    04/24/18 0231 04/25/18 0418 04/26/18 0541 04/27/18 0450  WBC 30.6* 20.8* 18.0* 14.8*  HGB 10.3* 9.8* 9.2* 9.3*  HCT 32.3* 31.3* 28.9* 29.4*  PLT 227 236 240 245    COAGS: Recent Labs    04/20/18 0915 04/20/18 1229 04/21/18 0403 04/25/18 1108  INR 2.21 1.96 1.43 1.51    BMP: Recent Labs    04/24/18 0231 04/25/18 0418 04/26/18 0541 04/27/18 0450  NA 134* 134* 135 136  K 3.9 3.9 4.0 3.4*  CL 97* 96* 98 99  CO2 24 21* 19* 26  GLUCOSE 85 81 70 88  BUN 40* 66* 81* 25*  CALCIUM 7.3* 7.4* 7.1* 7.3*  CREATININE 5.25* 7.09* 7.97* 3.90*  GFRNONAA 9* 7* 6* 14*  GFRAA 11* 8* 7* 16*    LIVER FUNCTION TESTS: Recent Labs    07/23/17 1211  04/24/18 0231 04/25/18 0418 04/26/18 0541 04/27/18 0450  BILITOT 0.3  --   --   --   --   --   AST 24  --   --   --   --   --   ALT 18  --   --   --   --   --   ALKPHOS 76  --   --   --   --   --   PROT 7.6  --   --   --   --   --   ALBUMIN 4.0   < > 2.3* 2.3* 2.1* 2.2*   < > = values in this interval not displayed.    Assessment and Plan:  Bleeding at temp cath site.  Appears to only be slow oozing.  I did not change the dressing for fear of causing more rapid bleeding.  Since the oozing is slow, and given the patient's CAD/NSTEMI and pending cardiac catheterization, the risks of holding heparin drip outweigh the benefit to stop the oozing.  Only hold heparin if bleeding becomes more significant.  Electronically Signed: Murrell Redden, PA-C 04/27/2018, 11:37 AM    I spent a total of 15 Minutes at the the patient's bedside AND on the patient's hospital floor or unit, greater than 50% of which was counseling/coordinating care for bleeding at  Cath site.
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
## 2018-04-27 NOTE — Progress Notes (Signed)
Subjective: Interval History: has no complaint, R arm still swollen. L feeling better.  Feels better overall.  Objective: Vital signs in last 24 hours: Temp:  [97.7 F (36.5 C)-99.1 F (37.3 C)] 98.6 F (37 C) (10/20 0823) Pulse Rate:  [55-95] 64 (10/20 0407) Resp:  [19-20] 20 (10/20 0407) BP: (99-173)/(48-72) 173/55 (10/20 0823) SpO2:  [92 %-96 %] 94 % (10/20 0823) Weight:  [77.1 kg-77.5 kg] 77.1 kg (10/20 0407) Weight change: -2 kg  Intake/Output from previous day: 10/19 0701 - 10/20 0700 In: 659.7 [P.O.:180; I.V.:332.6; IV Piggyback:147.1] Out: 1527  Intake/Output this shift: No intake/output data recorded.  General appearance: alert, cooperative, no distress and pale Neck: RIJ cath Resp: diminished breath sounds bilaterally Chest wall: no tenderness Cardio: irregularly irregular rhythm and systolic murmur: systolic ejection 2/6, crescendo and decrescendo at 2nd left intercostal space GI: soft, pos bs, liver down 4 cm. Extremities: L arm avf some swelling at infilt, good B&T,  R arm edema.  Lab Results: Recent Labs    04/26/18 0541 04/27/18 0450  WBC 18.0* 14.8*  HGB 9.2* 9.3*  HCT 28.9* 29.4*  PLT 240 245   BMET:  Recent Labs    04/26/18 0541 04/27/18 0450  NA 135 136  K 4.0 3.4*  CL 98 99  CO2 19* 26  GLUCOSE 70 88  BUN 81* 25*  CREATININE 7.97* 3.90*  CALCIUM 7.1* 7.3*   No results for input(s): PTH in the last 72 hours. Iron Studies: No results for input(s): IRON, TIBC, TRANSFERRIN, FERRITIN in the last 72 hours.  Studies/Results: No results found.  I have reviewed the patient's current medications.  Assessment/Plan: 1 ESRD HD yest, vol ok.  Needs assess of avf, VVS to see.  May need PC temporarily.  No evidence infx 2 R arm edema from leak from blood draw and iv 3 Anemia stable on esa/Fe 4 HPTH stable 5 CAD per cards. 6 Afib rate controlled, on anticoag P HD on Tues, VVS to eval avf.  Cont esa, Fe     LOS: 9 days   Jeneen Rinks  Aspyn Warnke 04/27/2018,9:23 AM

## 2018-04-27 NOTE — Progress Notes (Signed)
Vascular preliminary  RUE venous duplex- negative for DVT and superficial thrombosis.  LUE dialysis access duplex- patent AVF.  Landry Mellow, RDMS, RVT

## 2018-04-27 NOTE — Progress Notes (Signed)
ANTICOAGULATION CONSULT NOTE - Follow Up Consult  Pharmacy Consult for heparin Indication: Afib/CAD  Labs: Recent Labs    04/25/18 0418 04/25/18 1108 04/26/18 0541 04/26/18 2012 04/27/18 0450 04/27/18 1214  HGB 9.8*  --  9.2*  --  9.3*  --   HCT 31.3*  --  28.9*  --  29.4*  --   PLT 236  --  240  --  245  --   LABPROT  --  18.0*  --   --   --   --   INR  --  1.51  --   --   --   --   HEPARINUNFRC 0.42  --   --  0.78* 0.46 1.06*  CREATININE 7.09*  --  7.97*  --  3.90*  --     Assessment/Plan:  77 y.o. M presents with STEMI. Pt was on coumadin PTA with admission INR 4.56 (reversed with vit K 5mg ) - down to 1.43. S/p cath 10/16 which shows severe 2v CAD. Considering PCI with impella support vs medical management. Heparin restarted post cath.   Heparin level came back supratherapeutic at 1.06 and bleeding was noted again at the temp HD site. Had nursing hold infusion for 1 hour. Level was drawn on same arm as heparin infusion so did request repeat level drawn from opposite arm. Repeat level came back at 0.3, on 1100 units/hr. Given bleeding and while awaiting result, heparin was held for 1 hour. Hgb 9.3, plt 245 yesterday. Bleeding has resolved. No other issues with infusion per nursing.   Informed nursing to resume therapeutic heparin rate of 1100 units/hr and continue to monitor for any bleeding.   Goal of Therapy:  Heparin level 0.3-0.5 units/ml Monitor platelets by anticoagulation protocol: Yes   Plan:  Restart heparin at 1100 units/hr -Monitor daily HL, CBC, and for s/sx of bleeding  Thanks for allowing pharmacy to be a part of this patient's care.  Doylene Canard, PharmD Clinical Pharmacist  Pager: [phone] Phone: [phone]

## 2018-04-27 NOTE — Progress Notes (Signed)
PROGRESS NOTE  Troy [name]  XTK:[id number] DOB: 15-Jan-1941 DOA: 04/18/2018 PCP: Gaynelle Arabian, MD  Brief Narrative: Troy [name] is a 77 y.o. 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 Also noted to have progressive renal failure with uremia for which nephrology was consulted and began hemodialysis 10/11 through remotely placed left RC AVF. An outpatient HD chair has been arranged.   Echocardiogram demonstrated worsening of LV dysfunction (EF 45-50% 2017 now 25-30%). Cardiology was consulted, recommending heart catheterization which the patient and family consented to after discussions with palliative care consultants. Left heart catheterization on 10/16 after vitamin K given for supratherapeutic INR showed LAD and RCA disease, though only LAD lesion was felt to be amenable to stenting. This is planned 10/18. On 10/17 he developed erythema proximal to fistula site for which vancomycin and aztreonam were started empirically.   Assessment & Plan: Principal Problem:   Acute on chronic systolic heart failure (HCC) Active Problems:   CKD (chronic kidney disease), stage V (HCC)   ESRD (end stage renal disease) (HCC)   Leukocytosis   Atypical chest pain   Paroxysmal A-fib (HCC)   NSTEMI (non-ST elevated myocardial infarction) ([place])   Myocardial infarction [facility])   Palliative care by specialist   DNR (do not resuscitate) discussion  NSTEMI: With hx abnormal stress testing Jan 2018 not followed by LHC due to CKD.  - LHC 04/23/2018 showed LAD and RCA lesions, plan for PCI to LAD next week (delayed due to inability to  cannulate AVF) with further plan based on clinical course (aggressive medical management vs. staged PCI to RCA). - Pt on heparin gtt, loaded with plavix, has continued DAPT, statin, and antihypertensives/antianginals as BP will tolerate. Per note, plan would by to continue DAPT x30 days without coumadin, then stop aspirin, continue plavix and restart coumadin.  Uncontrolled bleeding: Difficult situation in patient that requires heparin for confirmed NSTEMI. Qualitative platelet dysfunction likely contributing.  - Aim for low therapeutic goal of heparin (0.3-0.5) though currently 1.06. In that case will stop heparin and recheck lab. If bleeding becomes brisk would need protamine, no indication currently.  - Appreciate ongoing evaluations by IR. Sutured and dermabonded 10/20 without evidence of bleeding/hematoma.  - Do NOT stop heparin per cardiology for bleeding. Would support with transfusions if necessary. Check serial CBC  Chronic combined HFrEF: EF worsened to 25-30%.  - Manage volume with HD.  - Otherwise on guideline directed therapy as BP allows.   Sinus bradycardia: Does not seem to be symptomatic.  - Holding beta blocker. Management per cardiology.  ESRD: CLIP process completed, has spot at [facility] MWF at 12:15pm.  - Dialysis per nephrology last 10/19, next 10/21 following PCI hopefully.  Anemia of CKD and acute blood loss: s/p 1u PRBCs 10/11, 2u 10/12  in setting of AVF bleeding and supratherapeutic INR, now having IJ cath site bleeding.  - Monitor CBC regularly 10.9 >> 9.2 > 9.3, type and screen since ongoing blood loss. Heparin to continue with low therapeutic goal (0.3-0.5) - EPO per nephrology  Left arm cellulitis vs. fistula infection: WBC climbed 19 > 30, improving with antibiotics.  -  Initial blood cultures negative, repeat is negative at 2 days. - Continue empiric vancomycin, aztreonam. Pharmacy following. Due to diagnostic uncertainty and still having leukocytosis and planning  invasive procedures, I would favor continuing broad coverage (narrowing limited by multiple allergies) for now.  Right arm edema: Unclear etiology. No SVT or DVT on U/S. Possibly due to hematoma following blood draw.  - Elevate above the level of the heart.   AVF difficulty cannulation: Patent on U/S 10/20.  - Vascular surgery consulted, planning fistulogram following PCI.  Acute toxic metabolic encephalopathy: Due to uremia. Resolved  Paroxysmal AFib:  - Continue amiodarone. Currently bradycardic so holding BB. - On heparin gtt, holding coumadin for now.  Supratherapeutic INR: Resolved s/p vit K.  PVD, carotid stenosis: s/p CEA 2011.  - Continue ASA, statin  Hypothyroidism: Chronic, stable.  - Continue synthroid 63mcg  DVT prophylaxis: Heparin Code Status: Full Family Communication: None at bedside this morning. Disposition Plan: SNF is recommended disposition, will be pending further investigation or AVF and treatment of infection and cardiac evaluation.    Consultants:   Cardiology  Nephrology  Interventional radiology  Procedures:   HD   LHC 04/23/2018  Antimicrobials:  Vancomycin, aztreonam 10/17 >>   Subjective: Feels generally well without dyspnea or chest pain, though discouraged by ongoing bleeding, "if it's not one thing it's another." No lightheadedness, dizziness, syncope.   Objective: Vitals:   04/26/18 1956 04/27/18 0004 04/27/18 0407 04/27/18 0823  BP: 127/72 130/62 (!) 146/55 (!) 173/55  Pulse: 80 80 64   Resp: 19 19 20    Temp: 97.8 F (36.6 C) 99.1 F (37.3 C) 98.2 F (36.8 C) 98.6 F (37 C)  TempSrc: Oral Oral Oral Oral  SpO2: 96% 96% 95% 94%  Weight:   77.1 kg   Height:        Intake/Output Summary (Last 24 hours) at 04/27/2018 1407 Last data filed at 04/27/2018 0600 Gross per 24 hour  Intake 539.71 ml  Output 1527 ml  Net -987.29 ml   Filed Weights   04/26/18 0457 04/26/18 1814 04/27/18 0407  Weight: 79.5 kg 77.5 kg 77.1  kg   Gen: Pleasant elderly male in no distress Pulm: Nonlabored breathing, clear. CV: Regular rate and rhythm. No murmur, rub, or gallop. No JVD. No leg edema. GI: Abdomen soft, non-tender, non-distended, with normoactive bowel sounds.  Ext: RUE with diffuse pitting edema. Hematoma superior to right AC fossa. Left arm with erythema stable, minimally tender, no induration.  Skin: Right IJ cath site with oozing blood at time of my exam. Also as above. Neuro: Alert and oriented. No focal neurological deficits. Psych: Judgement and insight appear fair. Mood euthymic & affect congruent. Behavior is appropriate.    Data Reviewed: I have personally reviewed following labs and imaging studies  CBC: Recent Labs  Lab 04/22/18 0615 04/23/18 0339 04/24/18 0231 04/25/18 0418 04/26/18 0541 04/27/18 0450  WBC 17.3* 19.5* 30.6* 20.8* 18.0* 14.8*  NEUTROABS 15.7*  --   --   --   --   --   HGB 10.9* 10.1* 10.3* 9.8* 9.2* 9.3*  HCT 34.0* 32.2* 32.3* 31.3* 28.9* 29.4*  MCV 95.5 97.3 97.6 98.1 97.0 97.7  PLT 226 221 227 236 240 237   Basic Metabolic Panel: Recent Labs  Lab 04/23/18 0339 04/24/18 0231 04/25/18 0418 04/26/18 0541 04/27/18 0450  NA 132* 134* 134* 135 136  K 4.0 3.9 3.9 4.0 3.4*  CL 97* 97* 96* 98 99  CO2 21* 24 21* 19*  26  GLUCOSE 113* 85 81 70 88  BUN 78* 40* 66* 81* 25*  CREATININE 7.46* 5.25* 7.09* 7.97* 3.90*  CALCIUM 7.5* 7.3* 7.4* 7.1* 7.3*  PHOS  --  6.3* 7.4* 8.6* 3.9   GFR: Estimated Creatinine Clearance: 15.5 mL/min (A) (by C-G formula based on SCr of 3.9 mg/dL (H)). Liver Function Tests: Recent Labs  Lab 04/24/18 0231 04/25/18 0418 04/26/18 0541 04/27/18 0450  ALBUMIN 2.3* 2.3* 2.1* 2.2*   No results for input(s): LIPASE, AMYLASE in the last 168 hours. No results for input(s): AMMONIA in the last 168 hours. Coagulation Profile: Recent Labs  Lab 04/21/18 0403 04/25/18 1108  INR 1.43 1.51   Cardiac Enzymes: No results for input(s): CKTOTAL, CKMB,  CKMBINDEX, TROPONINI in the last 168 hours. BNP (last 3 results) No results for input(s): PROBNP in the last 8760 hours. HbA1C: No results for input(s): HGBA1C in the last 72 hours. CBG: No results for input(s): GLUCAP in the last 168 hours. Lipid Profile: No results for input(s): CHOL, HDL, LDLCALC, TRIG, CHOLHDL, LDLDIRECT in the last 72 hours. Thyroid Function Tests: No results for input(s): TSH, T4TOTAL, FREET4, T3FREE, THYROIDAB in the last 72 hours. Anemia Panel: No results for input(s): VITAMINB12, FOLATE, FERRITIN, TIBC, IRON, RETICCTPCT in the last 72 hours. Urine analysis:    Component Value Date/Time   COLORURINE YELLOW 06/05/2016 0328   APPEARANCEUR CLEAR 06/05/2016 0328   LABSPEC 1.010 06/05/2016 0328   PHURINE 5.0 06/05/2016 0328   GLUCOSEU NEGATIVE 06/05/2016 0328   HGBUR TRACE (A) 06/05/2016 0328   BILIRUBINUR NEGATIVE 06/05/2016 0328   KETONESUR NEGATIVE 06/05/2016 0328   PROTEINUR NEGATIVE 06/05/2016 0328   UROBILINOGEN 0.2 02/15/2010 0845   NITRITE NEGATIVE 06/05/2016 0328   LEUKOCYTESUR NEGATIVE 06/05/2016 0328   Recent Results (from the past 240 hour(s))  MRSA PCR Screening     Status: None   Collection Time: 04/18/18 10:00 PM  Result Value Ref Range Status   MRSA by PCR NEGATIVE NEGATIVE Final    Comment:        The GeneXpert MRSA Assay (FDA approved for NASAL specimens only), is one component of a comprehensive MRSA colonization surveillance program. It is not intended to diagnose MRSA infection nor to guide or monitor treatment for MRSA infections. Performed at [facility], [place] [address] [place], [place] [address]   Culture, blood (Routine X 2) w Reflex to ID Panel     Status: None   Collection Time: 04/21/18 12:08 PM  Result Value Ref Range Status   Specimen Description BLOOD RIGHT HAND  Final   Special Requests   Final    BOTTLES DRAWN AEROBIC ONLY Blood Culture adequate volume   Culture   Final    NO GROWTH 5 DAYS Performed at  [facility], [place] [address] [place], [place] [address]    Report Status 04/26/2018 FINAL  Final  Culture, blood (Routine X 2) w Reflex to ID Panel     Status: None   Collection Time: 04/21/18 12:12 PM  Result Value Ref Range Status   Specimen Description BLOOD RIGHT HAND  Final   Special Requests   Final    BOTTLES DRAWN AEROBIC ONLY Blood Culture adequate volume   Culture   Final    NO GROWTH 5 DAYS Performed at [facility], [place] [address], [place], [place] [address]    Report Status 04/26/2018 FINAL  Final  Culture, blood (routine x 2)     Status: None (  Preliminary result)   Collection Time: 04/24/18  6:00 AM  Result Value Ref Range Status   Specimen Description BLOOD RIGHT ANTECUBITAL  Final   Special Requests   Final    BOTTLES DRAWN AEROBIC AND ANAEROBIC Blood Culture adequate volume   Culture   Final    NO GROWTH 3 DAYS Performed at [facility], [address] [place], [place] [address]    Report Status PENDING  Incomplete  Culture, blood (routine x 2)     Status: None (Preliminary result)   Collection Time: 04/24/18  6:15 AM  Result Value Ref Range Status   Specimen Description BLOOD RIGHT HAND  Final   Special Requests   Final    BOTTLES DRAWN AEROBIC AND ANAEROBIC Blood Culture adequate volume   Culture   Final    NO GROWTH 3 DAYS Performed at [facility], [place] [address] [place],  [address]    Report Status PENDING  Incomplete      Radiology Studies: No results found.  Scheduled Meds: . amiodarone  200 mg Oral Daily  . aspirin EC  81 mg Oral Daily  . atorvastatin  20 mg Oral QHS  . Chlorhexidine Gluconate Cloth  6 each Topical Q0600  . clopidogrel  75 mg Oral Daily  . [START ON 04/29/2018] Darbepoetin Alfa  200 mcg Intravenous Q Tue-HD  . feeding supplement  1 Container Oral BID BM  . feeding supplement (PRO-STAT SUGAR FREE 64)  30 mL Oral Daily  . levothyroxine  50 mcg Oral QAC breakfast  . lidocaine-prilocaine    Topical Q M,W,F  . multivitamin  1 tablet Oral QHS  . sodium chloride flush  3 mL Intravenous Q12H  . sodium chloride flush  3 mL Intravenous Q12H   Continuous Infusions: . sodium chloride    . sodium chloride    . sodium chloride 10 mL/hr at 04/25/18 0635  . aztreonam 0.5 g (04/27/18 1110)  . dextrose Stopped (04/23/18 1100)  . ferric gluconate (FERRLECIT/NULECIT) IV Stopped (04/23/18 1632)  . heparin Stopped (04/27/18 1320)  . [START ON 04/29/2018] vancomycin       LOS: 9 days   Time spent: 35 minutes.  Patrecia Pour, MD [facility] [email or web address] Password [email or web address] 04/27/2018, 2:07 PM

## 2018-04-27 NOTE — Progress Notes (Signed)
  Called to check Temp cath site. It was only oozing slowly earlier, but now blood was running down the patients chest.  Given CAD and MI, patient is too high risk to hold heparin drip.   I removed the saturated dressings and prepped the area with betadine. I draped the area using sterile technique.  I anesthetized the skin using 2 mL of  1% Lidocaine.  Next I used 3-0 Ethilon and placed 2 interrupted sutures circumferentially around the catheter and snugged it down securely.   I also applied some DermaBond over the area at the recommendation of Dr. Earleen Newport.  I observed the area for active bleeding for about 5 minutes. There was no bleeding seen and no hematoma formation along the track.  I placed a Biopatch, then a sterile gauze, then Tegaderm.  Krishawn Vanderweele S Emalea Mix PA-C 04/27/2018 1:18 PM

## 2018-04-27 NOTE — Progress Notes (Signed)
Patient has an edemadous area to right arm between the [facility] and upper forearm that now is more reddened/edemadous/hard to the touch and very warm. Pt is on a Heparin drip at 1100 units/hr, elevated arm, will text page [facility] to see about getting a doppler test of that arm, will continue to monitor.

## 2018-04-27 NOTE — Progress Notes (Signed)
Pharmacy Antibiotic Note  Troy [name] is a 77 y.o. male with possible AVF infection.  Pharmacy has been consulted for Vancomycin and Aztreonam  dosing.  Currently on day #4 of antibiotics. WBC on admission was 24.2 - now down to 14.8. Afebrile. Cx showing no growth to date. New ESRD patient. Notes indicate still have edemadous area on UE.   Plan: Continue vancomycin 750 mg IV after each HD Continue Aztreonam 500 mg IV q12h  Obtain random prior to 3rd vancomycin dose Monitor cx results, clinical pic, and HD schedule  Will defer to primary team regarding duration of broad spectrum antibiotics given no growth on cx, afebrile, and WBC improving.   Height: 5\' 6"  (167.6 cm) Weight: 169 lb 15.6 oz (77.1 kg) IBW/kg (Calculated) : 63.8  Temp (24hrs), Avg:98.3 F (36.8 C), Min:97.7 F (36.5 C), Max:99.1 F (37.3 C)  Recent Labs  Lab 04/23/18 0339 04/24/18 0231 04/25/18 0418 04/26/18 0541 04/27/18 0450  WBC 19.5* 30.6* 20.8* 18.0* 14.8*  CREATININE 7.46* 5.25* 7.09* 7.97* 3.90*    Estimated Creatinine Clearance: 15.5 mL/min (A) (by C-G formula based on SCr of 3.9 mg/dL (H)).    Allergies  Allergen Reactions  . Penicillins Anaphylaxis and Other (See Comments)    Passed out  . Tetanus Toxoids Anaphylaxis  . Sulfa Antibiotics Other (See Comments)    Childhood reaction    Doylene Canard, PharmD Clinical Pharmacist  Pager: [phone] Phone: [phone] 04/27/2018 10:30 AM

## 2018-04-27 NOTE — Progress Notes (Signed)
ANTICOAGULATION CONSULT NOTE - Follow Up Consult  Pharmacy Consult for heparin Indication: Afib/CAD  Labs: Recent Labs    04/25/18 0418 04/25/18 1108 04/26/18 0541 04/26/18 2012 04/27/18 0450  HGB 9.8*  --  9.2*  --  9.3*  HCT 31.3*  --  28.9*  --  29.4*  PLT 236  --  240  --  245  LABPROT  --  18.0*  --   --   --   INR  --  1.51  --   --   --   HEPARINUNFRC 0.42  --   --  0.78* 0.46  CREATININE 7.09*  --  7.97*  --  3.90*    Assessment/Plan:  77 y.o. M presents with STEMI. Pt was on coumadin PTA with admission INR 4.56 (reversed with vit K 5mg ) - down to 1.43. S/p cath 10/16 which shows severe 2v CAD. Considering PCI with impella support vs medical management. Heparin restarted post cath.   Heparin level this am 0.46 units/ml.  Hg low but stable   Goal of Therapy:  Heparin level 0.3-0.5 units/ml Monitor platelets by anticoagulation protocol: Yes   Plan:  Continue heparin at 1100 units/hr -Monitor daily HL, CBC, and for s/sx of bleeding  Thanks for allowing pharmacy to be a part of this patient's care.  Excell Seltzer, PharmD Clinical Pharmacist

## 2018-04-27 NOTE — Progress Notes (Signed)
Pts HD cath site still bleeding significantly - spoke with Dr. Bonner Puna regarding management of this. Stated he spoke with cardiology who stated IV heparin needed to continue, as patient is too high risk to stop medication. Spoke with Tommi Rumps in [place] who will contact [facility] to come reassess patient's site. Will continue to monitor patient closely.

## 2018-04-27 NOTE — Progress Notes (Signed)
Pt's heparin stopped per Pharmacy d/t elevated heparin level - heparin level reordered STAT. HD cath site addressed by IR PA - currently clean, dry and intact.

## 2018-04-28 DIAGNOSIS — Z7189 Other specified counseling: Secondary | ICD-10-CM

## 2018-04-28 LAB — CBC
HCT: 28.8 % — ABNORMAL LOW (ref 39.0–52.0)
HEMOGLOBIN: 8.7 g/dL — AB (ref 13.0–17.0)
MCH: 30.2 pg (ref 26.0–34.0)
MCHC: 30.2 g/dL (ref 30.0–36.0)
MCV: 100 fL (ref 80.0–100.0)
NRBC: 0 % (ref 0.0–0.2)
Platelets: 224 10*3/uL (ref 150–400)
RBC: 2.88 MIL/uL — ABNORMAL LOW (ref 4.22–5.81)
RDW: 18.8 % — AB (ref 11.5–15.5)
WBC: 12.4 10*3/uL — AB (ref 4.0–10.5)

## 2018-04-28 LAB — RENAL FUNCTION PANEL
ANION GAP: 12 (ref 5–15)
Albumin: 2.3 g/dL — ABNORMAL LOW (ref 3.5–5.0)
BUN: 38 mg/dL — ABNORMAL HIGH (ref 8–23)
CALCIUM: 7.2 mg/dL — AB (ref 8.9–10.3)
CHLORIDE: 101 mmol/L (ref 98–111)
CO2: 23 mmol/L (ref 22–32)
CREATININE: 5.44 mg/dL — AB (ref 0.61–1.24)
GFR calc non Af Amer: 9 mL/min — ABNORMAL LOW (ref >60)
GFR, EST AFRICAN AMERICAN: 11 mL/min — AB (ref >60)
GLUCOSE: 90 mg/dL (ref 70–99)
Phosphorus: 4.9 mg/dL — ABNORMAL HIGH (ref 2.5–4.6)
Potassium: 3.5 mmol/L (ref 3.5–5.1)
SODIUM: 136 mmol/L (ref 135–145)

## 2018-04-28 LAB — HEPARIN LEVEL (UNFRACTIONATED): Heparin Unfractionated: 0.42 IU/mL (ref 0.30–0.70)

## 2018-04-28 MED ORDER — VANCOMYCIN HCL IN DEXTROSE 750-5 MG/150ML-% IV SOLN
750.0000 mg | Freq: Once | INTRAVENOUS | Status: AC
  Administered 2018-04-28: 750 mg via INTRAVENOUS

## 2018-04-28 MED ORDER — ALBUMIN HUMAN 25 % IV SOLN
INTRAVENOUS | Status: AC
  Administered 2018-04-28: 25 g via INTRAVENOUS
  Filled 2018-04-28: qty 100

## 2018-04-28 MED ORDER — CHLORHEXIDINE GLUCONATE CLOTH 2 % EX PADS: 6.0000 | MEDICATED_PAD | Freq: Every day | CUTANEOUS | Status: DC

## 2018-04-28 MED ORDER — ALBUMIN HUMAN 25 % IV SOLN
25.0000 g | Freq: Once | INTRAVENOUS | Status: AC
  Administered 2018-04-28: 25 g via INTRAVENOUS

## 2018-04-28 MED ORDER — VANCOMYCIN HCL IN DEXTROSE 750-5 MG/150ML-% IV SOLN
INTRAVENOUS | Status: AC
  Administered 2018-04-28: 750 mg via INTRAVENOUS
  Filled 2018-04-28: qty 150

## 2018-04-28 MED ORDER — VANCOMYCIN HCL IN DEXTROSE 750-5 MG/150ML-% IV SOLN: 750.0000 mg | INTRAVENOUS | Status: DC

## 2018-04-28 NOTE — Progress Notes (Signed)
HD tx initiated via HD cath w/o problem Bilat ports: pull/push/flush equally w/o problem VSS Will continue to monitor while on HD tx 

## 2018-04-28 NOTE — Progress Notes (Signed)
Progress Note  Patient Name: Troy [name] Date of Encounter: 04/28/2018  Primary Cardiologist: Kirk Ruths, MD   Subjective   No chest pain, pain in RUE sec to hematoma and extravasation.  Inpatient Medications    Scheduled Meds: . amiodarone  200 mg Oral Daily  . aspirin EC  81 mg Oral Daily  . atorvastatin  20 mg Oral QHS  . Chlorhexidine Gluconate Cloth  6 each Topical Q0600  . clopidogrel  75 mg Oral Daily  . [START ON 04/29/2018] Darbepoetin Alfa  200 mcg Intravenous Q Tue-HD  . feeding supplement  1 Container Oral BID BM  . feeding supplement (PRO-STAT SUGAR FREE 64)  30 mL Oral Daily  . levothyroxine  50 mcg Oral QAC breakfast  . lidocaine-prilocaine   Topical Q M,W,F  . multivitamin  1 tablet Oral QHS  . sodium chloride flush  3 mL Intravenous Q12H  . sodium chloride flush  3 mL Intravenous Q12H   Continuous Infusions: . sodium chloride    . sodium chloride    . sodium chloride 10 mL/hr at 04/25/18 0635  . aztreonam 0.5 g (04/28/18 0850)  . dextrose Stopped (04/23/18 1100)  . ferric gluconate (FERRLECIT/NULECIT) IV Stopped (04/23/18 1632)  . heparin 1,100 Units/hr (04/28/18 0936)  . [START ON 04/29/2018] vancomycin     PRN Meds: sodium chloride, sodium chloride, acetaminophen, heparin, lidocaine, nitroGLYCERIN, ondansetron **OR** ondansetron (ZOFRAN) IV, sodium chloride flush, sodium chloride flush   Vital Signs    Vitals:   04/27/18 1711 04/27/18 2046 04/28/18 0038 04/28/18 0300  BP: (!) 118/41 (!) 106/49 (!) 112/46 (!) 95/49  Pulse: 63 62 60 66  Resp:  16 14 15   Temp: 98.9 F (37.2 C) 97.7 F (36.5 C)  98 F (36.7 C)  TempSrc: Oral Oral  Oral  SpO2: 97% 96%    Weight:    76.4 kg  Height:        Intake/Output Summary (Last 24 hours) at 04/28/2018 1012 Last data filed at 04/28/2018 0600 Gross per 24 hour  Intake 717.61 ml  Output -  Net 717.61 ml   Filed Weights   04/26/18 1814 04/27/18 0407 04/28/18 0300  Weight: 77.5 kg 77.1 kg 76.4  kg    Physical Exam   BP (!) 95/49 (BP Location: Left Leg)   Pulse 66   Temp 98 F (36.7 C) (Oral)   Resp 15   Ht 5\' 6"  (1.676 m)   Wt 76.4 kg   SpO2 96%   BMI 27.19 kg/m  Affect appropriate Chronically ill white male  HEENT: normal Neck supple with no adenopathy Lungs clear with no wheezing Heart:  S1/S2 3/6 sys murmur at LLSB, no rub, gallop or click PMI normal Abdomen: benighn, BS positve, no tenderness, no AAA no bruit.  No HSM or HJR Distal pulses intact with no bruits No edema Neuro non-focal Skin warm and dry No muscular weakness RUE with significant hematoma, tender Fistula with dressing LUE  Labs    Chemistry Recent Labs  Lab 04/26/18 0541 04/27/18 0450 04/28/18 0711  NA 135 136 136  K 4.0 3.4* 3.5  CL 98 99 101  CO2 19* 26 23  GLUCOSE 70 88 90  BUN 81* 25* 38*  CREATININE 7.97* 3.90* 5.44*  CALCIUM 7.1* 7.3* 7.2*  ALBUMIN 2.1* 2.2* 2.3*  GFRNONAA 6* 14* 9*  GFRAA 7* 16* 11*  ANIONGAP 18* 11 12     Hematology Recent Labs  Lab 04/27/18 0450 04/27/18 1623 04/28/18  0711  WBC 14.8* 14.8* 12.4*  RBC 3.01* 3.02* 2.88*  HGB 9.3* 9.3* 8.7*  HCT 29.4* 29.7* 28.8*  MCV 97.7 98.3 100.0  MCH 30.9 30.8 30.2  MCHC 31.6 31.3 30.2  RDW 19.1* 19.0* 18.8*  PLT 245 231 224    Cardiac Enzymes No results for input(s): TROPONINI in the last 168 hours.  No results for input(s): TROPIPOC in the last 168 hours.   BNP No results for input(s): BNP, PROBNP in the last 168 hours.   Patient Profile     77 y.o. male admitted with non-ST elevation myocardial infarction.  Also with history of chronic kidney disease and dialysis has now been initiated.  Also with history of paroxysmal atrial fibrillation, chronic combined systolic/diastolic congestive heart failure, hypertension, hyperlipidemia, prior CVA, peripheral vascular disease.  Has required transfusion this admission for bleeding from AV fistula.  Echocardiogram shows newly reduced LV function with  ejection fraction 25 to 30%.  There is moderate mitral regurgitation.  Assessment & Plan    1 non-ST elevation myocardial infarction-as outlined in previous notes patient has severe right coronary artery disease and LAD disease.  Plan is to proceed with PCI of LAD but postponed until Tuesday at noon due to dialysis and access issues    2 ischemic CM-beta-blocker has been discontinued because of bradycardia.  Blood pressure is borderline not on nitrates or hydralazine at this point   3 acute on chronic kidney disease-? Bleeding from qualitative platelet defect need to have fistuala, bleeding from new Access catheter and RUE swelling improved before any intervention is considered Hct is down this am from oozing catheter sight  Had dialysis yesterday Dr Deterding's note indicates purse string suture needed for dialysis catheter but just seems taped at this point   4 paroxysmal atrial fibrillation-continue amiodarone to help maintain sinus rhythm.  Given age and overall frail body habitus would plan aspirin and Plavix for 30 days following PCI with no Coumadin.  After 30 days to discontinue aspirin, continue Plavix and resume Coumadin at that time.  5 acute systolic congestive heart failure-volume removal per dialysis.  6 elevated white blood cell count-appears to have potential infection of AV fistula/left upper extremity.  Antibiotics initiated.  Per internal medicine. Blood cultures negative to date.  7. Anemia - chronic related to ESRD, however today 9.3->8.7, most probably related to RUE hematoma, we will follow   For questions or updates, please contact [place] Please consult [email or web address] for contact info under     Signed, Ena Dawley, MD  04/28/2018, 10:12 AM

## 2018-04-28 NOTE — Progress Notes (Signed)
Patient ID: Troy [name], male   DOB: Dec 04, 1940, 77 y.o.   MRN: [id number]  This NP visited patient at the bedside as a follow up for palliative medcine needs and emotional support.  I met with Troy [name] and his two children  last for [place] meeting.  Dialysis has been initialed and patient "feels much better"  He is looking forward to discharge to SNF for rehab and eventually home.   We discussed anticipatory care needs especially considering his wife's needs  who has dementia.  Placed phone call and left message for daughter/ Troy [name] who is main support person and await callback, specific to Troy [name] desire to document  an AD specific to DNR and artifical feeding.  Discussed with patient the importance of continued conversation with his family and his  medical providers regarding overall plan of care and treatment options,  ensuring decisions are within the context of the patients values and GOCs.  Questions and concerns addressed     Total time spent on the unit was 35 minutes  Greater than 50% of the time was spent in counseling and coordination of care  Wadie Lessen NP  Palliative Medicine Team Team Phone # [phone] Pager [phone]

## 2018-04-28 NOTE — Progress Notes (Signed)
CSW continuing to follow for disposition planning. Patient's facility will need to obtain new [facility] authorization, as patient was not medically ready to admit last week. Patient declined to participate with PT today. However, updated PT notes are needed for insurance authorization. CSW encouraged patient to work with PT when they return. Noted plan for patient's procedure tomorrow. CSW to continue to follow for medical readiness and will support with discharge planning.  Estanislado Emms, [place]

## 2018-04-28 NOTE — Progress Notes (Signed)
PROGRESS NOTE  Troy [name]  GYF:[id number] DOB: 03/20/1941 DOA: 04/18/2018 PCP: Gaynelle Arabian, MD  Brief Narrative: Troy [name] is a 77 y.o. 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 Also noted to have progressive renal failure with uremia for which nephrology was consulted and began hemodialysis 10/11 through remotely placed left RC AVF. An outpatient HD chair has been arranged.   Echocardiogram demonstrated worsening of LV dysfunction (EF 45-50% 2017 now 25-30%). Cardiology was consulted, recommending heart catheterization which the patient and family consented to after discussions with palliative care consultants. Left heart catheterization on 10/16 after vitamin K given for supratherapeutic INR showed LAD and RCA disease, though only LAD lesion was felt to be amenable to stenting. On 10/17 he developed erythema proximal to fistula site for which vancomycin and aztreonam were started empirically. Stenting to LAD was scheduled but postponed due to lack of dialysis access (fistula not able to be cannulated). IR placed dialysis catheter in right IJ for HD, though this has had recurrent bleeding issues for which dermabond and suturing were preformed 10/20. Course further complicated by anemia due to bleeding and hematoma formation on the right arm (U/S ruled out clot).  Assessment & Plan: Principal Problem:   Acute on chronic systolic heart failure (HCC) Active Problems:   CKD (chronic kidney disease), stage V (HCC)   ESRD (end stage renal disease) (HCC)   Leukocytosis   Atypical chest pain   Paroxysmal A-fib (HCC)  NSTEMI (non-ST elevated myocardial infarction) ([place])   Myocardial infarction [facility])   Palliative care by specialist   DNR (do not resuscitate) discussion  NSTEMI: With hx abnormal stress testing Jan 2018 not followed by LHC due to CKD.  - LHC 04/23/2018 showed LAD and RCA lesions, plan for PCI to LAD 10/22 (delayed due to inabilityUncontrolled bleeding:  to cannulate AVF) with further plan based on clinical course (aggressive medical management vs. staged PCI to RCA). - Pt on heparin gtt, loaded with plavix, has continued DAPT, statin, and antihypertensives/antianginals as BP will tolerate. Per note, plan would by to continue DAPT x30 days without coumadin, then stop aspirin, continue plavix and restart coumadin.  Difficult situation in patient that requires heparin for confirmed NSTEMI. Qualitative platelet dysfunction likely contributing.  - Aim for low therapeutic goal of heparin (0.3-0.5).  - Appreciate ongoing evaluations by IR. Sutured and dermabonded 10/20 without evidence of bleeding/hematoma.  - Do NOT stop heparin per cardiology for bleeding. Would support with transfusions if necessary. CBC again in AM, dropped this morning.  Chronic combined HFrEF: EF worsened to 25-30%.  - Manage volume with HD.  - Otherwise on guideline directed therapy as BP allows.   Sinus bradycardia: Does not seem to be symptomatic.  - Holding beta blocker. Management per cardiology.  ESRD: CLIP process completed, has spot at [facility] MWF at 12:15pm.  - Dialysis per nephrology last 10/19, next 10/21 following PCI hopefully.  AVF difficulty cannulation: Patent on U/S 10/20.  - HD thru right IJ. - Vascular surgery consulted, planning fistulogram following PCI.  Anemia of CKD and acute blood loss: s/p 1u PRBCs 10/11, 2u 10/12  in setting  of AVF bleeding and supratherapeutic INR, now having IJ cath site bleeding.  - Monitor CBC regularly 10.9 >> 9.2 > 9.3, type and screen since ongoing blood loss. Heparin to  continue with low therapeutic goal (0.3-0.5) - EPO per nephrology  Left arm cellulitis vs. fistula infection: WBC climbed 19 > 30, improving slowly with antibiotics.  - Initial blood cultures negative, repeat is negative at 4 days. - Continue empiric vancomycin, aztreonam. Pharmacy following. Due to diagnostic uncertainty and still having leukocytosis and planning invasive procedures, I continue to favor continuing broad coverage (narrowing further limited by multiple allergies) for now.  Right arm edema, hematoma, extravasation: No SVT or DVT on U/S. Unable to DC blood thinners - Elevate above the level of the heart.   Acute toxic metabolic encephalopathy: Due to uremia. Resolved  Paroxysmal AFib:  - Continue amiodarone. Currently bradycardic so holding BB. - On heparin gtt, holding coumadin for now.  Supratherapeutic INR: Resolved s/p vit K.  PVD, carotid stenosis: s/p CEA 2011.  - Continue ASA, statin  Hypothyroidism: Chronic, stable.  - Continue synthroid 66mcg  DVT prophylaxis: Heparin Code Status: Full Family Communication: None at bedside this morning. Disposition Plan: SNF is recommended disposition, will be pending further investigation or AVF and treatment of infection and cardiac evaluation.    Consultants:   Cardiology  Nephrology  Interventional radiology  Vascular surgery  Procedures:   HD   LHC 04/23/2018  Antimicrobials:  Vancomycin, aztreonam 10/17 >>   Subjective: Right arm in area of hematoma continues to get bigger and hurt, not elevating it. No other bleeding from IJ site noted. Denies chest pain or dyspnea. Wants to go home, but knows it will be a while.  Objective: Vitals:   04/27/18 1711 04/27/18 2046 04/28/18 0038 04/28/18 0300  BP: (!) 118/41 (!) 106/49 (!) 112/46 (!) 95/49  Pulse: 63 62 60 66  Resp:  16 14 15   Temp: 98.9 F (37.2 C) 97.7 F (36.5 C)  98 F (36.7 C)  TempSrc: Oral Oral  Oral  SpO2: 97% 96%    Weight:    76.4  kg  Height:        Intake/Output Summary (Last 24 hours) at 04/28/2018 1050 Last data filed at 04/28/2018 0600 Gross per 24 hour  Intake 717.61 ml  Output -  Net 717.61 ml   Filed Weights   04/26/18 1814 04/27/18 0407 04/28/18 0300  Weight: 77.5 kg 77.1 kg 76.4 kg   Gen: 77 y.o. male in no distress Pulm: Nonlabored breathing room air. Clear. CV: Regular rate and rhythm. No murmur, rub, or gallop. No JVD, no dependent edema. GI: Abdomen soft, non-tender, non-distended, with normoactive bowel sounds.  Ext: Left AVF +thrill, proximal ecchymosis/erythema, tender and warm, no fluctuance. Right AC fossa with expanding ecchymosis and diffuse edema. Tender to palpation, not warm.  Skin: Right IJ site with c/d/i 2x2 gauze under tegaderm. No other rashes, lesions or ulcers on visualized skin.  Neuro: Alert and oriented. No focal neurological deficits. Psych: Judgement and insight appear fair. Mood euthymic & affect congruent. Behavior is appropriate.    Data Reviewed: I have personally reviewed following labs and imaging studies  CBC: Recent Labs  Lab 04/22/18 0615  04/25/18 0418 04/26/18 0541 04/27/18 0450 04/27/18 1623 04/28/18 0711  WBC 17.3*   < > 20.8* 18.0* 14.8* 14.8* 12.4*  NEUTROABS 15.7*  --   --   --   --   --   --   HGB 10.9*   < >  9.8* 9.2* 9.3* 9.3* 8.7*  HCT 34.0*   < > 31.3* 28.9* 29.4* 29.7* 28.8*  MCV 95.5   < > 98.1 97.0 97.7 98.3 100.0  PLT 226   < > 236 240 245 231 224   < > = values in this interval not displayed.   Basic Metabolic Panel: Recent Labs  Lab 04/24/18 0231 04/25/18 0418 04/26/18 0541 04/27/18 0450 04/28/18 0711  NA 134* 134* 135 136 136  K 3.9 3.9 4.0 3.4* 3.5  CL 97* 96* 98 99 101  CO2 24 21* 19* 26 23  GLUCOSE 85 81 70 88 90  BUN 40* 66* 81* 25* 38*  CREATININE 5.25* 7.09* 7.97* 3.90* 5.44*  CALCIUM 7.3* 7.4* 7.1* 7.3* 7.2*  PHOS 6.3* 7.4* 8.6* 3.9 4.9*   GFR: Estimated Creatinine Clearance: 10.3 mL/min (A) (by C-G formula based  on SCr of 5.44 mg/dL (H)). Liver Function Tests: Recent Labs  Lab 04/24/18 0231 04/25/18 0418 04/26/18 0541 04/27/18 0450 04/28/18 0711  ALBUMIN 2.3* 2.3* 2.1* 2.2* 2.3*   No results for input(s): LIPASE, AMYLASE in the last 168 hours. No results for input(s): AMMONIA in the last 168 hours. Coagulation Profile: Recent Labs  Lab 04/25/18 1108  INR 1.51   Cardiac Enzymes: No results for input(s): CKTOTAL, CKMB, CKMBINDEX, TROPONINI in the last 168 hours. BNP (last 3 results) No results for input(s): PROBNP in the last 8760 hours. HbA1C: No results for input(s): HGBA1C in the last 72 hours. CBG: No results for input(s): GLUCAP in the last 168 hours. Lipid Profile: No results for input(s): CHOL, HDL, LDLCALC, TRIG, CHOLHDL, LDLDIRECT in the last 72 hours. Thyroid Function Tests: No results for input(s): TSH, T4TOTAL, FREET4, T3FREE, THYROIDAB in the last 72 hours. Anemia Panel: No results for input(s): VITAMINB12, FOLATE, FERRITIN, TIBC, IRON, RETICCTPCT in the last 72 hours. Urine analysis:    Component Value Date/Time   COLORURINE YELLOW 06/05/2016 0328   APPEARANCEUR CLEAR 06/05/2016 0328   LABSPEC 1.010 06/05/2016 0328   PHURINE 5.0 06/05/2016 0328   GLUCOSEU NEGATIVE 06/05/2016 0328   HGBUR TRACE (A) 06/05/2016 0328   BILIRUBINUR NEGATIVE 06/05/2016 0328   KETONESUR NEGATIVE 06/05/2016 0328   PROTEINUR NEGATIVE 06/05/2016 0328   UROBILINOGEN 0.2 02/15/2010 0845   NITRITE NEGATIVE 06/05/2016 0328   LEUKOCYTESUR NEGATIVE 06/05/2016 0328   Recent Results (from the past 240 hour(s))  MRSA PCR Screening     Status: None   Collection Time: 04/18/18 10:00 PM  Result Value Ref Range Status   MRSA by PCR NEGATIVE NEGATIVE Final    Comment:        The GeneXpert MRSA Assay (FDA approved for NASAL specimens only), is one component of a comprehensive MRSA colonization surveillance program. It is not intended to diagnose MRSA infection nor to guide or monitor  treatment for MRSA infections. Performed at [facility], [place] [address], [place], [place] [address]   Culture, blood (Routine X 2) w Reflex to ID Panel     Status: None   Collection Time: 04/21/18 12:08 PM  Result Value Ref Range Status   Specimen Description BLOOD RIGHT HAND  Final   Special Requests   Final    BOTTLES DRAWN AEROBIC ONLY Blood Culture adequate volume   Culture   Final    NO GROWTH 5 DAYS Performed at [facility], [place] [address] [place], [place] [address]    Report Status 04/26/2018 FINAL  Final  Culture, blood (Routine X 2) w Reflex  to ID Panel     Status: None   Collection Time: 04/21/18 12:12 PM  Result Value Ref Range Status   Specimen Description BLOOD RIGHT HAND  Final   Special Requests   Final    BOTTLES DRAWN AEROBIC ONLY Blood Culture adequate volume   Culture   Final    NO GROWTH 5 DAYS Performed at [facility], [address], [place], [place] [address]    Report Status 04/26/2018 FINAL  Final  Culture, blood (routine x 2)     Status: None (Preliminary result)   Collection Time: 04/24/18  6:00 AM  Result Value Ref Range Status   Specimen Description BLOOD RIGHT ANTECUBITAL  Final   Special Requests   Final    BOTTLES DRAWN AEROBIC AND ANAEROBIC Blood Culture adequate volume   Culture   Final    NO GROWTH 4 DAYS Performed at [facility], [place] [address], [place], [place] [address]    Report Status PENDING  Incomplete  Culture, blood (routine x 2)     Status: None (Preliminary result)   Collection Time: 04/24/18  6:15 AM  Result Value Ref Range Status   Specimen Description BLOOD RIGHT HAND  Final   Special Requests   Final    BOTTLES DRAWN AEROBIC AND ANAEROBIC Blood Culture adequate volume   Culture   Final    NO GROWTH 4 DAYS Performed at [facility], [place] [address] [place], [place] [address]    Report Status PENDING  Incomplete      Radiology Studies: No results found.  Scheduled Meds: .  amiodarone  200 mg Oral Daily  . aspirin EC  81 mg Oral Daily  . atorvastatin  20 mg Oral QHS  . Chlorhexidine Gluconate Cloth  6 each Topical Q0600  . clopidogrel  75 mg Oral Daily  . [START ON 04/29/2018] Darbepoetin Alfa  200 mcg Intravenous Q Tue-HD  . feeding supplement  1 Container Oral BID BM  . feeding supplement (PRO-STAT SUGAR FREE 64)  30 mL Oral Daily  . levothyroxine  50 mcg Oral QAC breakfast  . lidocaine-prilocaine   Topical Q M,W,F  . multivitamin  1 tablet Oral QHS  . sodium chloride flush  3 mL Intravenous Q12H  . sodium chloride flush  3 mL Intravenous Q12H   Continuous Infusions: . sodium chloride    . sodium chloride    . sodium chloride 10 mL/hr at 04/25/18 0635  . aztreonam 0.5 g (04/28/18 0850)  . dextrose Stopped (04/23/18 1100)  . ferric gluconate (FERRLECIT/NULECIT) IV Stopped (04/23/18 1632)  . heparin 1,100 Units/hr (04/28/18 0936)  . [START ON 04/29/2018] vancomycin       LOS: 10 days   Time spent: 35 minutes.  Patrecia Pour, MD [facility] [email or web address] Password [email or web address] 04/28/2018, 10:50 AM

## 2018-04-28 NOTE — Progress Notes (Signed)
PT Cancellation Note  Patient Details Name: Troy [name] MRN: [id number] DOB: [birth year]   Cancelled Treatment:    Reason Eval/Treat Not Completed: Fatigue/lethargy limiting ability to participate. Pt politely declining PT today stating he is just too tired. Per nephrology note 04/27/18, pt to have HD Tues. Pt reports he was told HD today and PCI Tues. PT to re-attempt as time allows.   Lorriane Shire 04/28/2018, 10:57 AM   Lorrin Goodell, PT  Office # [phone] Pager [phone]

## 2018-04-28 NOTE — Progress Notes (Signed)
Subjective:  Soft BP overnight - bleeding from temp cath again overnight- sutured- remains on heparin- plans for this week PCI and fistulogram.  Last HD was Sat.  Patient wanted to do HD today so that he did not have to do HD and heart cath on same day.  In good spirits in spite of all that has happened Objective Vital signs in last 24 hours: Vitals:   04/27/18 1711 04/27/18 2046 04/28/18 0038 04/28/18 0300  BP: (!) 118/41 (!) 106/49 (!) 112/46 (!) 95/49  Pulse: 63 62 60 66  Resp:  16 14 15   Temp: 98.9 F (37.2 C) 97.7 F (36.5 C)  98 F (36.7 C)  TempSrc: Oral Oral  Oral  SpO2: 97% 96%    Weight:    76.4 kg  Height:       Weight change: -1.1 kg  Intake/Output Summary (Last 24 hours) at 04/28/2018 1108 Last data filed at 04/28/2018 0600 Gross per 24 hour  Intake 717.61 ml  Output -  Net 717.61 ml    Assessment/ Plan: Pt is a 77 y.o. yo male who was admitted on 04/18/2018 with new start to HD- hosp complicated by worsening cardiac status/Afib, cath showing CAD needs intervention- also poorly functioning AVF  Assessment/Plan: 1. Renal- new start to HD- have been running TTS.  Access issues- bleeding from AVF- difficult to cannulate.  Now s/p temp cath per IR on 10/18.  VVS on board to do fistulogram and possible TDC if AVF now felt to be usable.  Was planning for HD on Tuesday but coordinate with cards for him to get PCI first, pt wants HD today - not sure I will be able to do due to high procedure load, so HD today or tomorrow   2. CAD/afib- cath showing CAD- for high risk PCI this week (Tuesday) per cards  3. Anemia- CKD and ongoing bleeding - ESA and iron being given 4. Secondary hyperparathyroidism- PTH 63- I think was on vit D as OP- phos now 4.9 no binder  5. HTN/volume- BP soft, amiodarone- not on any BP meds   Troy [name]    Labs: Basic Metabolic Panel: Recent Labs  Lab 04/26/18 0541 04/27/18 0450 04/28/18 0711  NA 135 136 136  K 4.0 3.4* 3.5  CL 98 99  101  CO2 19* 26 23  GLUCOSE 70 88 90  BUN 81* 25* 38*  CREATININE 7.97* 3.90* 5.44*  CALCIUM 7.1* 7.3* 7.2*  PHOS 8.6* 3.9 4.9*   Liver Function Tests: Recent Labs  Lab 04/26/18 0541 04/27/18 0450 04/28/18 0711  ALBUMIN 2.1* 2.2* 2.3*   No results for input(s): LIPASE, AMYLASE in the last 168 hours. No results for input(s): AMMONIA in the last 168 hours. CBC: Recent Labs  Lab 04/22/18 0615  04/25/18 0418 04/26/18 0541 04/27/18 0450 04/27/18 1623 04/28/18 0711  WBC 17.3*   < > 20.8* 18.0* 14.8* 14.8* 12.4*  NEUTROABS 15.7*  --   --   --   --   --   --   HGB 10.9*   < > 9.8* 9.2* 9.3* 9.3* 8.7*  HCT 34.0*   < > 31.3* 28.9* 29.4* 29.7* 28.8*  MCV 95.5   < > 98.1 97.0 97.7 98.3 100.0  PLT 226   < > 236 240 245 231 224   < > = values in this interval not displayed.   Cardiac Enzymes: No results for input(s): CKTOTAL, CKMB, CKMBINDEX, TROPONINI in the last 168 hours. CBG: No results  for input(s): GLUCAP in the last 168 hours.  Iron Studies: No results for input(s): IRON, TIBC, TRANSFERRIN, FERRITIN in the last 72 hours. Studies/Results: No results found. Medications: Infusions: . sodium chloride    . sodium chloride    . sodium chloride 10 mL/hr at 04/25/18 0635  . aztreonam 0.5 g (04/28/18 0850)  . dextrose Stopped (04/23/18 1100)  . ferric gluconate (FERRLECIT/NULECIT) IV Stopped (04/23/18 1632)  . heparin 1,100 Units/hr (04/28/18 0936)  . [START ON 04/29/2018] vancomycin      Scheduled Medications: . amiodarone  200 mg Oral Daily  . aspirin EC  81 mg Oral Daily  . atorvastatin  20 mg Oral QHS  . Chlorhexidine Gluconate Cloth  6 each Topical Q0600  . clopidogrel  75 mg Oral Daily  . [START ON 04/29/2018] Darbepoetin Alfa  200 mcg Intravenous Q Tue-HD  . feeding supplement  1 Container Oral BID BM  . feeding supplement (PRO-STAT SUGAR FREE 64)  30 mL Oral Daily  . levothyroxine  50 mcg Oral QAC breakfast  . lidocaine-prilocaine   Topical Q M,W,F  .  multivitamin  1 tablet Oral QHS  . sodium chloride flush  3 mL Intravenous Q12H  . sodium chloride flush  3 mL Intravenous Q12H    have reviewed scheduled and prn medications.  Physical Exam: General: alert, plea[place] Heart: brady Lungs: mostly clear Abdomen: soft, non tender Extremities: min edema Dialysis Access: right sided temp cath -not bleeding- left AVF wrapped but can appreciate bruit    04/28/2018,11:08 AM  LOS: 10 days

## 2018-04-28 NOTE — Progress Notes (Signed)
Pharmacy:  In HD tonight rather than usual TTS schedule Vancomycin 750 mg IV to be given tonight after HD. Will continue Vanc 750 mg IV after HD-TTS Rescheduled next dose for 10/24, but will follow up HD plans.  Kelvin Cellar, RPh Pager: [phone] 04/28/2018 9:19 PM

## 2018-04-29 ENCOUNTER — Inpatient Hospital Stay (HOSPITAL_COMMUNITY)
Admission: EM | Disposition: A | Discharge status: Skilled Nursing Facility | Payer: Self-pay | Source: Home / Self Care | Attending: Internal Medicine

## 2018-04-29 HISTORY — PX: CORONARY ATHERECTOMY: CATH118238

## 2018-04-29 HISTORY — PX: LEFT HEART CATH AND CORONARY ANGIOGRAPHY: CATH118249

## 2018-04-29 HISTORY — PX: CORONARY STENT INTERVENTION: CATH118234

## 2018-04-29 LAB — CBC
HCT: 27.4 % — ABNORMAL LOW (ref 39.0–52.0)
Hemoglobin: 8.5 g/dL — ABNORMAL LOW (ref 13.0–17.0)
MCH: 30.6 pg (ref 26.0–34.0)
MCHC: 31 g/dL (ref 30.0–36.0)
MCV: 98.6 fL (ref 80.0–100.0)
PLATELETS: 197 10*3/uL (ref 150–400)
RBC: 2.78 MIL/uL — AB (ref 4.22–5.81)
RDW: 18.6 % — ABNORMAL HIGH (ref 11.5–15.5)
WBC: 10.6 10*3/uL — ABNORMAL HIGH (ref 4.0–10.5)
nRBC: 0 % (ref 0.0–0.2)

## 2018-04-29 LAB — CULTURE, BLOOD (ROUTINE X 2)
Culture: NO GROWTH
Culture: NO GROWTH
Special Requests: ADEQUATE
Special Requests: ADEQUATE

## 2018-04-29 LAB — RENAL FUNCTION PANEL
ALBUMIN: 2.7 g/dL — AB (ref 3.5–5.0)
Anion gap: 10 (ref 5–15)
BUN: 11 mg/dL (ref 8–23)
CALCIUM: 7.7 mg/dL — AB (ref 8.9–10.3)
CO2: 26 mmol/L (ref 22–32)
CREATININE: 2.74 mg/dL — AB (ref 0.61–1.24)
Chloride: 101 mmol/L (ref 98–111)
GFR calc Af Amer: 24 mL/min — ABNORMAL LOW (ref >60)
GFR, EST NON AFRICAN AMERICAN: 21 mL/min — AB (ref >60)
Glucose, Bld: 84 mg/dL (ref 70–99)
PHOSPHORUS: 2.5 mg/dL (ref 2.5–4.6)
POTASSIUM: 3.8 mmol/L (ref 3.5–5.1)
SODIUM: 137 mmol/L (ref 135–145)

## 2018-04-29 LAB — GLUCOSE, CAPILLARY: GLUCOSE-CAPILLARY: 102 mg/dL — AB (ref 70–99)

## 2018-04-29 LAB — POCT ACTIVATED CLOTTING TIME
ACTIVATED CLOTTING TIME: 334 s
Activated Clotting Time: 324 seconds
Activated Clotting Time: 692 seconds

## 2018-04-29 LAB — HEPARIN LEVEL (UNFRACTIONATED): HEPARIN UNFRACTIONATED: 0.49 [IU]/mL (ref 0.30–0.70)

## 2018-04-29 SURGERY — CORONARY ATHERECTOMY
Anesthesia: LOCAL

## 2018-04-29 MED ORDER — LIDOCAINE HCL (PF) 1 % IJ SOLN
INTRAMUSCULAR | Status: DC | PRN
  Administered 2018-04-29: 14 mL

## 2018-04-29 MED ORDER — MIDAZOLAM HCL 2 MG/2ML IJ SOLN
INTRAMUSCULAR | Status: DC | PRN
  Administered 2018-04-29 (×2): 0.5 mg via INTRAVENOUS

## 2018-04-29 MED ORDER — MORPHINE SULFATE (PF) 2 MG/ML IV SOLN
2.0000 mg | INTRAVENOUS | Status: AC | PRN
  Administered 2018-04-29 – 2018-04-30 (×3): 2 mg via INTRAVENOUS
  Filled 2018-04-29 (×2): qty 1

## 2018-04-29 MED ORDER — DEXTROSE 5 % IV SOLN
0.5000 g | Freq: Two times a day (BID) | INTRAVENOUS | Status: AC
  Administered 2018-04-29 – 2018-04-30 (×3): 0.5 g via INTRAVENOUS
  Filled 2018-04-29 (×3): qty 0.5

## 2018-04-29 MED ORDER — MIDAZOLAM HCL 2 MG/2ML IJ SOLN
INTRAMUSCULAR | Status: AC
  Filled 2018-04-29: qty 2

## 2018-04-29 MED ORDER — FENTANYL CITRATE (PF) 100 MCG/2ML IJ SOLN
INTRAMUSCULAR | Status: DC | PRN
  Administered 2018-04-29 (×2): 12.5 ug via INTRAVENOUS

## 2018-04-29 MED ORDER — VIPERSLIDE LUBRICANT OPTIME
TOPICAL | Status: DC | PRN
  Administered 2018-04-29: 20 mL via SURGICAL_CAVITY

## 2018-04-29 MED ORDER — CLOPIDOGREL BISULFATE 75 MG PO TABS: 75.0000 mg | ORAL_TABLET | Freq: Every day | ORAL | Status: DC

## 2018-04-29 MED ORDER — LIDOCAINE HCL (PF) 1 % IJ SOLN
INTRAMUSCULAR | Status: AC
  Filled 2018-04-29: qty 30

## 2018-04-29 MED ORDER — HEPARIN (PORCINE) IN NACL 1000-0.9 UT/500ML-% IV SOLN
INTRAVENOUS | Status: DC | PRN
  Administered 2018-04-29 (×2): 500 mL

## 2018-04-29 MED ORDER — SODIUM CHLORIDE 0.9 % IV SOLN: 250.0000 mL | INTRAVENOUS | Status: DC | PRN

## 2018-04-29 MED ORDER — CHLORHEXIDINE GLUCONATE CLOTH 2 % EX PADS
6.0000 | MEDICATED_PAD | Freq: Every day | CUTANEOUS | Status: DC
  Administered 2018-04-29 – 2018-05-08 (×7): 6 via TOPICAL

## 2018-04-29 MED ORDER — HEPARIN SODIUM (PORCINE) 1000 UNIT/ML IJ SOLN
INTRAMUSCULAR | Status: AC
  Administered 2018-04-29: 2800 [IU] via INTRAVENOUS
  Filled 2018-04-29: qty 3

## 2018-04-29 MED ORDER — HEART ATTACK BOUNCING BOOK
Freq: Once | Status: AC
  Administered 2018-04-29: 22:00:00
  Filled 2018-04-29: qty 1

## 2018-04-29 MED ORDER — HEPARIN SODIUM (PORCINE) 1000 UNIT/ML IJ SOLN
INTRAMUSCULAR | Status: DC | PRN
  Administered 2018-04-29: 8000 [IU] via INTRAVENOUS
  Administered 2018-04-29: 3000 [IU] via INTRAVENOUS

## 2018-04-29 MED ORDER — NITROGLYCERIN 1 MG/10 ML FOR IR/CATH LAB
INTRA_ARTERIAL | Status: AC
  Filled 2018-04-29: qty 10

## 2018-04-29 MED ORDER — ANGIOPLASTY BOOK
Freq: Once | Status: AC
  Administered 2018-04-29: 22:00:00
  Filled 2018-04-29: qty 1

## 2018-04-29 MED ORDER — HEPARIN SODIUM (PORCINE) 5000 UNIT/ML IJ SOLN
5000.0000 [IU] | Freq: Three times a day (TID) | INTRAMUSCULAR | Status: DC
  Administered 2018-04-29 – 2018-04-30 (×2): 5000 [IU] via SUBCUTANEOUS
  Filled 2018-04-29 (×3): qty 1

## 2018-04-29 MED ORDER — SODIUM CHLORIDE 0.9 % IV SOLN
INTRAVENOUS | Status: DC
  Administered 2018-04-29: 06:00:00 via INTRAVENOUS

## 2018-04-29 MED ORDER — HEPARIN (PORCINE) IN NACL 1000-0.9 UT/500ML-% IV SOLN
INTRAVENOUS | Status: AC
  Filled 2018-04-29: qty 500

## 2018-04-29 MED ORDER — ONDANSETRON HCL 4 MG/2ML IJ SOLN: 4.0000 mg | Freq: Four times a day (QID) | INTRAMUSCULAR | Status: DC | PRN

## 2018-04-29 MED ORDER — HYDRALAZINE HCL 20 MG/ML IJ SOLN: 5.0000 mg | INTRAMUSCULAR | Status: AC | PRN

## 2018-04-29 MED ORDER — ACETAMINOPHEN 325 MG PO TABS: 650.0000 mg | ORAL_TABLET | ORAL | Status: DC | PRN

## 2018-04-29 MED ORDER — IOHEXOL 350 MG/ML SOLN
INTRAVENOUS | Status: DC | PRN
  Administered 2018-04-29: 110 mL via INTRA_ARTERIAL

## 2018-04-29 MED ORDER — FENTANYL CITRATE (PF) 100 MCG/2ML IJ SOLN
INTRAMUSCULAR | Status: AC
  Filled 2018-04-29: qty 2

## 2018-04-29 MED ORDER — HEPARIN (PORCINE) IN NACL 1000-0.9 UT/500ML-% IV SOLN
INTRAVENOUS | Status: AC
  Filled 2018-04-29: qty 1000

## 2018-04-29 MED ORDER — LABETALOL HCL 5 MG/ML IV SOLN: 10.0000 mg | INTRAVENOUS | Status: AC | PRN

## 2018-04-29 MED ORDER — SODIUM CHLORIDE 0.9% FLUSH
3.0000 mL | Freq: Two times a day (BID) | INTRAVENOUS | Status: DC
  Administered 2018-04-29 – 2018-05-08 (×16): 3 mL via INTRAVENOUS

## 2018-04-29 MED ORDER — SODIUM CHLORIDE 0.9% FLUSH
3.0000 mL | INTRAVENOUS | Status: DC | PRN
  Administered 2018-04-30 (×2): 3 mL via INTRAVENOUS
  Filled 2018-04-29 (×2): qty 3

## 2018-04-29 SURGICAL SUPPLY — 24 items
BALLN SAPPHIRE 2.5X20 (BALLOONS) ×2
BALLN SAPPHIRE ~~LOC~~ 3.25X18 (BALLOONS) ×1 IMPLANT
BALLN TREK OTW 2.5X12 (BALLOONS) ×2
BALLOON SAPPHIRE 2.5X20 (BALLOONS) IMPLANT
BALLOON TREK OTW 2.5X12 (BALLOONS) IMPLANT
CATH INFINITI JR4 5F (CATHETERS) ×1 IMPLANT
CATH LAUNCHER 6FR EBU 4 (CATHETERS) ×1 IMPLANT
CLOSURE MYNX CONTROL 6F/7F (Vascular Products) ×1 IMPLANT
CROWN DIAMONDBACK CLASSIC 1.25 (BURR) ×1 IMPLANT
ELECT DEFIB PAD ADLT CADENCE (PAD) ×1 IMPLANT
KIT ENCORE 26 ADVANTAGE (KITS) ×1 IMPLANT
KIT HEART LEFT (KITS) ×2 IMPLANT
KIT HEMO VALVE WATCHDOG (MISCELLANEOUS) ×1 IMPLANT
LUBRICANT VIPERSLIDE CORONARY (MISCELLANEOUS) ×1 IMPLANT
PACK CARDIAC CATHETERIZATION (CUSTOM PROCEDURE TRAY) ×2 IMPLANT
SHEATH BRITE TIP 6FR 35CM (SHEATH) ×1 IMPLANT
SHEATH PINNACLE 6F 10CM (SHEATH) ×1 IMPLANT
SHEATH PROBE COVER 6X72 (BAG) ×1 IMPLANT
STENT SYNERGY DES 3X32 (Permanent Stent) ×1 IMPLANT
TRANSDUCER W/STOPCOCK (MISCELLANEOUS) ×2 IMPLANT
TUBING CIL FLEX 10 FLL-RA (TUBING) ×3 IMPLANT
WIRE ASAHI PROWATER 180CM (WIRE) ×1 IMPLANT
WIRE EMERALD 3MM-J .035X150CM (WIRE) ×1 IMPLANT
WIRE VIPER ADVANCE COR .012TIP (WIRE) ×1 IMPLANT

## 2018-04-29 NOTE — Progress Notes (Signed)
ANTICOAGULATION CONSULT NOTE - Follow Up Consult  Pharmacy Consult for heparin Indication: Afib/CAD  Labs: Recent Labs    04/27/18 0450  04/27/18 1340 04/27/18 1623 04/28/18 0711 04/29/18 0424  HGB 9.3*  --   --  9.3* 8.7* 8.5*  HCT 29.4*  --   --  29.7* 28.8* 27.4*  PLT 245  --   --  231 224 197  HEPARINUNFRC 0.46   < > 0.30  --  0.42 0.49  CREATININE 3.90*  --   --   --  5.44* 2.74*   < > = values in this interval not displayed.    Assessment/Plan:  77 y.o. M presents with STEMI. Pt was on coumadin PTA with admission INR 4.56 (reversed with vit K 5mg ) - down to 1.43. S/p cath 10/16 which shows severe 2v CAD. Considering PCI with impella support vs medical management. Heparin restarted post cath.  HL at goal-0.49, CBC stable  Goal of Therapy:  Heparin level 0.3-0.5 units/ml Monitor platelets by anticoagulation protocol: Yes   Plan:  -No change to heparin rate needed -Monitor daily HL, CBC, and for s/sx of bleeding -Pt back to cath today Thanks for allowing pharmacy to be a part of this patient's care.  Kathleene Hazel, Rph Please check Amion for pharmacy contact number

## 2018-04-29 NOTE — Progress Notes (Signed)
Received patient s/p PCI , AAO x 4, rt groin site level 0 , rt arm swollen with large ecchymosis from R upper arm to mid arm /brachial area, with palpable hematoma, warm to touch, brachial / radial pulses present . Patient stated "it has been like that for few days from heparin" Almyra Deforest notified and evaluated patient's arm . Hematoma site marked, arm cicumference at 29 cm , . Denies pain on positioning .site monitored, elevated arm on pillow.

## 2018-04-29 NOTE — Progress Notes (Signed)
Subjective:  Did HD last night- removed 2000 tolerated well   Objective Vital signs in last 24 hours: Vitals:   04/29/18 0300 04/29/18 0530 04/29/18 0600 04/29/18 0800  BP: (!) 135/57 (!) 94/48 (!) 135/57 (!) 101/31  Pulse:  66  68  Resp:  15  17  Temp: 97.9 F (36.6 C)     TempSrc: Oral     SpO2:  100%  98%  Weight: 78.7 kg     Height:       Weight change: 2.3 kg  Intake/Output Summary (Last 24 hours) at 04/29/2018 1017 Last data filed at 04/29/2018 9371 Gross per 24 hour  Intake 609.93 ml  Output 2004 ml  Net -1394.07 ml    Assessment/ Plan: Pt is a 77 y.o. yo male who was admitted on 04/18/2018 with new start to HD- hosp complicated by worsening cardiac status/Afib, cath showing CAD needs intervention- also poorly functioning AVF  Assessment/Plan: 1. Renal- new start to HD- had been running TTS but ran 10/21 (mon) for procedures.  Access issues- bleeding from AVF- difficult to cannulate.  Now s/p temp cath per IR on 10/18.  VVS on board to do fistulogram after PCI and possible TDC if AVF not felt to be usable. Will tentatively plan for next HD on Wed.  Has actually tolerated dialysis pretty well considering....   2. CAD/afib/CM- cath showing CAD- for high risk PCI this week (today) per cards  3. Anemia- CKD and ongoing bleeding - ESA and iron being given- drifting down 4. Secondary hyperparathyroidism- PTH 63- I think was on vit D as OP- phos now 2.5 no binder  5. HTN/volume- BP soft, amiodarone- not on any BP meds   [name]    Labs: Basic Metabolic Panel: Recent Labs  Lab 04/27/18 0450 04/28/18 0711 04/29/18 0424  NA 136 136 137  K 3.4* 3.5 3.8  CL 99 101 101  CO2 26 23 26   GLUCOSE 88 90 84  BUN 25* 38* 11  CREATININE 3.90* 5.44* 2.74*  CALCIUM 7.3* 7.2* 7.7*  PHOS 3.9 4.9* 2.5   Liver Function Tests: Recent Labs  Lab 04/27/18 0450 04/28/18 0711 04/29/18 0424  ALBUMIN 2.2* 2.3* 2.7*   No results for input(s): LIPASE, AMYLASE in the last  168 hours. No results for input(s): AMMONIA in the last 168 hours. CBC: Recent Labs  Lab 04/26/18 0541 04/27/18 0450 04/27/18 1623 04/28/18 0711 04/29/18 0424  WBC 18.0* 14.8* 14.8* 12.4* 10.6*  HGB 9.2* 9.3* 9.3* 8.7* 8.5*  HCT 28.9* 29.4* 29.7* 28.8* 27.4*  MCV 97.0 97.7 98.3 100.0 98.6  PLT 240 245 231 224 197   Cardiac Enzymes: No results for input(s): CKTOTAL, CKMB, CKMBINDEX, TROPONINI in the last 168 hours. CBG: No results for input(s): GLUCAP in the last 168 hours.  Iron Studies: No results for input(s): IRON, TIBC, TRANSFERRIN, FERRITIN in the last 72 hours. Studies/Results: No results found. Medications: Infusions: . sodium chloride    . sodium chloride    . sodium chloride 10 mL/hr at 04/25/18 0635  . sodium chloride 10 mL/hr at 04/29/18 0555  . aztreonam 0.5 g (04/29/18 0949)  . dextrose Stopped (04/23/18 1100)  . ferric gluconate (FERRLECIT/NULECIT) IV Stopped (04/28/18 2300)  . heparin 1,100 Units/hr (04/29/18 0807)  . [START ON 05/01/2018] vancomycin      Scheduled Medications: . amiodarone  200 mg Oral Daily  . aspirin EC  81 mg Oral Daily  . atorvastatin  20 mg Oral QHS  . Chlorhexidine  Gluconate Cloth  6 each Topical [id number]  . clopidogrel  75 mg Oral Daily  . Darbepoetin Alfa  200 mcg Intravenous Q Tue-HD  . feeding supplement  1 Container Oral BID BM  . feeding supplement (PRO-STAT SUGAR FREE 64)  30 mL Oral Daily  . levothyroxine  50 mcg Oral QAC breakfast  . lidocaine-prilocaine   Topical Q M,W,F  . multivitamin  1 tablet Oral QHS  . sodium chloride flush  3 mL Intravenous Q12H  . sodium chloride flush  3 mL Intravenous Q12H    have reviewed scheduled and prn medications.  Physical Exam: General: alert, plea[place] Heart: brady Lungs: mostly clear Abdomen: soft, non tender Extremities: min edema Dialysis Access: right sided temp cath placed 10/18 -not bleeding- left AVF wrapped but can appreciate bruit    04/29/2018,10:17 AM  LOS: 11  days

## 2018-04-29 NOTE — Interval H&P Note (Signed)
           Cath Lab Visit (complete for each Cath Lab visit)  Clinical Evaluation Leading to the Procedure:   ACS: Yes.    Non-ACS:    Anginal Classification: CCS IV  Anti-ischemic medical therapy: Minimal Therapy (1 class of medications)  Non-Invasive Test Results: High-risk stress test findings: cardiac mortality >3%/year  EF low  Prior CABG: No previous CABG   I discussed the risks and benefits of atherectomy with the patient.   History and Physical Interval Note:  04/29/2018 1:29 PM  Troy [name]  has presented today for surgery, with the diagnosis of cad  The various methods of treatment have been discussed with the patient and family. After consideration of risks, benefits and other options for treatment, the patient has consented to  Procedure(s): CORONARY ATHERECTOMY (N/A) as a surgical intervention .  The patient's history has been reviewed, patient examined, no change in status, stable for surgery.  I have reviewed the patient's chart and labs.  Questions were answered to the patient's satisfaction.     Troy [name]

## 2018-04-29 NOTE — H&P (View-Only) (Signed)
Progress Note  Patient Name: Troy [name] Date of Encounter: 04/29/2018  Primary Cardiologist: Kirk Ruths, MD   Subjective   Denies any CP or SOB.   Inpatient Medications    Scheduled Meds: . amiodarone  200 mg Oral Daily  . aspirin EC  81 mg Oral Daily  . atorvastatin  20 mg Oral QHS  . Chlorhexidine Gluconate Cloth  6 each Topical Q0600  . clopidogrel  75 mg Oral Daily  . Darbepoetin Alfa  200 mcg Intravenous Q Tue-HD  . feeding supplement  1 Container Oral BID BM  . feeding supplement (PRO-STAT SUGAR FREE 64)  30 mL Oral Daily  . levothyroxine  50 mcg Oral QAC breakfast  . lidocaine-prilocaine   Topical Q M,W,F  . multivitamin  1 tablet Oral QHS  . sodium chloride flush  3 mL Intravenous Q12H  . sodium chloride flush  3 mL Intravenous Q12H   Continuous Infusions: . sodium chloride    . sodium chloride    . sodium chloride 10 mL/hr at 04/25/18 0635  . sodium chloride 10 mL/hr at 04/29/18 0555  . aztreonam Stopped (04/29/18 0259)  . dextrose Stopped (04/23/18 1100)  . ferric gluconate (FERRLECIT/NULECIT) IV Stopped (04/28/18 2300)  . heparin 1,100 Units/hr (04/29/18 0807)  . [START ON 05/01/2018] vancomycin     PRN Meds: sodium chloride, sodium chloride, acetaminophen, heparin, lidocaine, nitroGLYCERIN, ondansetron **OR** ondansetron (ZOFRAN) IV, sodium chloride flush, sodium chloride flush   Vital Signs    Vitals:   04/29/18 0027 04/29/18 0300 04/29/18 0530 04/29/18 0600  BP: (!) 104/29 (!) 135/57 (!) 94/48 (!) 135/57  Pulse: 61  66   Resp: 14  15   Temp: 98 F (36.7 C) 97.9 F (36.6 C)    TempSrc: Oral Oral    SpO2: 99%  100%   Weight: 76.7 kg 78.7 kg    Height:        Intake/Output Summary (Last 24 hours) at 04/29/2018 0810 Last data filed at 04/29/2018 0500 Gross per 24 hour  Intake 969.93 ml  Output 2004 ml  Net -1034.07 ml   Filed Weights   04/28/18 2012 04/29/18 0027 04/29/18 0300  Weight: 78.7 kg 76.7 kg 78.7 kg    Telemetry      NSR with occasional PVCs - Personally Reviewed  ECG    NSR with TWI in inferolateral leads - Personally Reviewed  Physical Exam   GEN: No acute distress.   Neck: No JVD Cardiac: RRR, no murmurs, rubs, or gallops.  Respiratory: Clear to auscultation bilaterally. GI: Soft, nontender, non-distended  MS: No edema; No deformity. Neuro:  Nonfocal  Psych: Normal affect   Labs    Chemistry Recent Labs  Lab 04/27/18 0450 04/28/18 0711 04/29/18 0424  NA 136 136 137  K 3.4* 3.5 3.8  CL 99 101 101  CO2 _0 GLUCOSE 88 90 84  BUN 25* 38* 11  CREATININE 3.90* 5.44* 2.74*  CALCIUM 7.3* 7.2* 7.7*  ALBUMIN 2.2* 2.3* 2.7*  GFRNONAA 14* 9* 21*  GFRAA 16* 11* 24*  ANIONGAP _1 Hematology Recent Labs  Lab 04/27/18 1623 04/28/18 0711 04/29/18 0424  WBC 14.8* 12.4* 10.6*  RBC 3.02* 2.88* 2.78*  HGB 9.3* 8.7* 8.5*  HCT 29.7* 28.8* 27.4*  MCV 98.3 100.0 98.6  MCH 30.8 30.2 30.6  MCHC 31.3 30.2 31.0  RDW 19.0* 18.8* 18.6*  PLT 231 224 197    Cardiac EnzymesNo results for input(s):  TROPONINI in the last 168 hours. No results for input(s): TROPIPOC in the last 168 hours.   BNPNo results for input(s): BNP, PROBNP in the last 168 hours.   DDimer No results for input(s): DDIMER in the last 168 hours.   Radiology    No results found.  Cardiac Studies   Echo 04/19/2018 LV EF: 25% -   30% Study Conclusions  - Left ventricle: The cavity size was normal. Wall thickness was   increased in a pattern of mild LVH. Systolic function was   severely reduced. The estimated ejection fraction was in the   range of 25% to 30%. Diffuse hypokinesis with regional variation.   Doppler parameters are consistent with restrictive diastolic   physiology. The E&'e&' ratio is markedly elevated >20, suggesting   increased LV filling pressure. - Mitral valve: MAC with leaflet calcification. Moderate   regurgitation. - Left atrium: Severely dilated. - Right ventricle: The  cavity size was mildly dilated. Moderately   reduced systolic function. - Right atrium: The atrium was mildly dilated. - Tricuspid valve: There was mild regurgitation. - Pulmonary arteries: PA peak pressure: 41 mm Hg (S). - Inferior vena cava: The vessel was dilated. The respirophasic   diameter changes were blunted (< 50%), consistent with elevated   central venous pressure.  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   Cath 04/23/2018  Severe 2 Vessel CAD:  Ost RCA to Prox RCA lesion is 95% stenosed. Prox RCA lesion is 80% stenosed. Prox RCA to Mid RCA lesion is 99% stenosed. Mid RCA lesion is 80% stenosed.  Ost LAD lesion is 30% stenosed. Prox LAD lesion is 95% stenosed.  Mid LM to Dist LM lesion is 35% stenosed.  Ost Cx to Prox Cx lesion is 60% stenosed. - very small caliber vessel  LV end diastolic pressure is moderately elevated.   Severe 2 Vessel CAD: 85% calcified pLAD, extensive calcified 95% ostial, 80&90% prox and 80% mid (modereate caliber) RCA with 90% tiny RPAV. 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  (Would be amenable to Impella)  Recommendation: Holding area for sheath removal.  Return to 6 E. for ongoing care. With severely calcified disease in the LAD and RCA that would likely require atherectomy in her low EF, would potentially benefit from Impella support --> with the patient being DNR and relatively poor status to begin with, would need to have conversation with other interventionalists and  attending cardiologist along with family to determine the best course of action going forward. -->  Perhaps medical management with palliative care would be an option.  I do think that attempted PCI would be best served with Impella support if possible.  I will restart IV heparin 8 hours after sheath removal. Will discuss with interventional cardiology colleagues to review images and determine potential interventional options and will communicate with Dr. Stanford Breed.   Patient Profile     77 y.o. male admitted with non-ST elevation myocardial infarction.  Also with history of chronic kidney disease and dialysis has now been initiated.  Also with  history of paroxysmal atrial fibrillation, chronic combined systolic/diastolic congestive heart failure, hypertension, hyperlipidemia, prior CVA, peripheral vascular disease.  Has required transfusion this admission for bleeding from AV fistula.  Echocardiogram shows newly reduced LV function with ejection fraction 25 to 30%.  There is moderate mitral regurgitation. Cath 10/16 showed significant RCA and LAD disease, plan for staged PCI. Hospitalization complicated by bleeding from L arm fistula site.   Assessment & Plan    1. NSTEMI  - myoview in 2018 was abnormal with TID and inferior ischemia.   - cath 10/16/019 95% ost RCA, 80% prox RCA, 99% prox RCA, 80% mid RCA, 95% prox LAD, 60% ost LCx . 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 FLP tomorrow AM.   2. Ischemic cardiomyopathy: EF down to 25% by echo from previous 45%  - not on BB due to borderline hypotension and occasional bradycardia.    3. 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 TTS. Bleeding from AVF  - per Dr. Trula Slade, poorly functioning L radiocephalic fistula, plan for fistulogram after PCI  4. PAF on amiodarone. Given age and frailty, plan ASA and plavix for 30 days following PCI with no  coumadin, after 30 days, D/C ASA and continue pavix and resume coumadin.  5. Acute systolic HF: managed through dialysis  - Echo 06/06/2016 EF 45-50%  - repeat echo this admission showed EF down to 25-30%, moderate MR, severe LAE  - euvolemic on exam  6. PAD s/p L CEA  7. 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   8. Moderate MR  9. Elevated WBC: improving after abx  For questions or updates, please contact [place] Please consult [email or web address] for contact info under   Signed, Almyra Deforest, [place]  04/29/2018, 8:10 AM     The patient was seen, examined and discussed with Almyra Deforest, PA-C and I agree with the above.   Plan for a cath/PCI today, s/p hemodialysis yesterday. The patient is asymptomatic, no recurrent chest pains. She appears euvolemic.  Ena Dawley, MD 04/29/2018

## 2018-04-29 NOTE — Progress Notes (Signed)
PROGRESS NOTE  Troy [name]  QHU:[id number] DOB: 03-17-1941 DOA: 04/18/2018 PCP: Gaynelle Arabian, MD  Brief Narrative: Troy [name] is a 77 y.o. 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 Also noted to have progressive renal failure with uremia for which nephrology was consulted and began hemodialysis 10/11 through remotely placed left RC AVF. An outpatient HD chair has been arranged.   Echocardiogram demonstrated worsening of LV dysfunction (EF 45-50% 2017 now 25-30%). Cardiology was consulted, recommending heart catheterization which the patient and family consented to after discussions with palliative care consultants. Left heart catheterization on 10/16 after vitamin K given for supratherapeutic INR showed LAD and RCA disease, though only LAD lesion was felt to be amenable to stenting. On 10/17 he developed erythema proximal to fistula site for which vancomycin and aztreonam were started empirically. Stenting to LAD was scheduled but postponed due to lack of dialysis access (fistula not able to be cannulated). IR placed dialysis catheter in right IJ for HD, though this has had recurrent bleeding issues for which dermabond and suturing were performed 10/20 with stability. Course further complicated by anemia due to bleeding and hematoma formation on the right arm (U/S ruled out clot). LHC planned 10/22 to be followed by fistulogram. Ultimate SNF disposition planned.  Assessment & Plan: Principal Problem:   Acute on chronic systolic heart failure (HCC) Active Problems:   CKD (chronic kidney disease), stage V (HCC)   ESRD (end  stage renal disease) (HCC)   Leukocytosis   Atypical chest pain   Paroxysmal A-fib (HCC)   NSTEMI (non-ST elevated myocardial infarction) [facility])   Myocardial infarction [facility])   Palliative care by specialist   DNR (do not resuscitate) discussion  NSTEMI: With hx abnormal stress testing Jan 2018 not followed by LHC due to CKD.  - LHC 04/23/2018 showed LAD and RCA lesions, plan PCI to LAD 10/22 (delayed due to fistula issues) with further plan based on clinical course (aggressive medical management vs. staged PCI to RCA). - Pt on heparin gtt, loaded with plavix, has continued DAPT, statin, and antihypertensives/antianginals as BP will tolerate. Per note, plan would by to continue DAPT x30 days without coumadin, then stop aspirin, continue plavix and restart coumadin.  Uncontrolled bleeding: Difficult situation in patient that requires heparin for confirmed NSTEMI. Qualitative platelet dysfunction likely contributing.  - Aim for low therapeutic goal of heparin (0.3-0.5).  - Appreciate ongoing evaluations by IR. Sutured and dermabonded 10/20 without evidence of bleeding/hematoma.  - Do NOT stop heparin per cardiology for bleeding. Would support with transfusions if necessary.   Chronic combined HFrEF: EF worsened to 25-30%.  - Manage volume with HD.  - Otherwise on guideline directed therapy as BP allows.   Sinus bradycardia: Does not seem to be symptomatic.  - Holding beta blocker. Management per cardiology.  ESRD: CLIP process completed, has spot at [facility] MWF at 12:15pm.  - Dialysis per nephrology last 10/21  AVF difficulty cannulation: Patent on U/S 10/20.  - HD thru right IJ. - Vascular surgery consulted, planning fistulogram following PCI.  Anemia of CKD and acute blood loss: s/p 1u PRBCs 10/11, 2u 10/12  in setting of AVF bleeding and supratherapeutic INR, later had IJ cath site bleeding seems to have improved but now with significant hematoma/ecchymosis on right arm possibly related  to blood draw. - Monitor CBC regularly 10.9 >> 8.5, type and screened since ongoing blood loss. Heparin to continue with low therapeutic goal (0.3-0.5) - EPO per nephrology  Left arm cellulitis vs. fistula infection: WBC climbed 19 > 30, improving slowly with antibiotics.  - Initial blood cultures negative, repeat is negative at 4 days. - Continue empiric vancomycin, aztreonam for total of 7 days (end tomorrow).   Right arm edema, hematoma, extravasation: No SVT or DVT on U/S. Unable to DC blood thinners - Elevate above the level of the heart.   Acute toxic metabolic encephalopathy: Due to uremia. Resolved  Paroxysmal AFib:  - Continue amiodarone. Currently bradycardic so holding BB. - On heparin gtt, holding coumadin for now.  Supratherapeutic INR: Resolved s/p vit K.  PVD, carotid stenosis: s/p CEA 2011.  - Continue ASA, statin  Hypothyroidism: Chronic, stable.  - Continue synthroid 45mcg  DVT prophylaxis: Heparin Code Status: Full Family Communication: None at bedside this morning. Disposition Plan: Requires ongoing work up, PCI, fistulogram, stabilization of bleeding. SNF is ultiamte disposition, but suspect that will be several days.    Consultants:   Cardiology  Nephrology  Interventional radiology  Vascular surgery  Procedures:   HD   LHC 04/23/2018  Antimicrobials:  Vancomycin, aztreonam 10/17 - 10/23  Subjective: Feels fatigued, right arm continues to be swollen, red, bruised. No bleeding from IJ any more for past couple days. Denies dyspnea or chest pain.   Objective: Vitals:   04/29/18 0530 04/29/18 0600 04/29/18 0800 04/29/18 1222  BP: (!) 94/48 (!) 135/57 (!) 101/31 (!) 123/39  Pulse: 66  68 69  Resp: 15  17 15   Temp:    98.4 F (36.9 C)  TempSrc:    Oral  SpO2: 100%  98% 98%  Weight:      Height:        Intake/Output Summary (Last 24 hours) at 04/29/2018 1242 Last data filed at 04/29/2018 7026 Gross per 24 hour  Intake 609.93 ml    Output 2004 ml  Net -1394.07 ml   Filed Weights   04/28/18 2012 04/29/18 0027 04/29/18 0300  Weight: 78.7 kg 76.7 kg 78.7 kg   Gen: Pleasant elderly male in no distress Pulm: Nonlabored. Clear . CV: Regular borderline bradycardia. No murmur, rub, or gallop. No JVD, trace, minimal dependent edema. GI: Abdomen soft, non-tender, non-distended, with normoactive bowel sounds.  Ext: +bruit on left AVF, proximal erythema stable. Right AC fossa with medial hematoma without fluctuance, stable, surrounding ecchymosis stable, significant. Diffusely edematous, also stable.  Skin: No new rashes, lesions or ulcers on visualized skin. Right IJ site hemostatic.  Neuro: Alert and oriented. No focal neurological deficits. Psych: Judgement and insight appear fair. Mood euthymic & affect congruent. Behavior is appropriate.    Data Reviewed: I have personally reviewed following labs and imaging studies  CBC: Recent Labs  Lab 04/26/18 0541 04/27/18 0450 04/27/18 1623 04/28/18 0711 04/29/18 0424  WBC 18.0* 14.8* 14.8* 12.4* 10.6*  HGB 9.2* 9.3* 9.3* 8.7* 8.5*  HCT 28.9* 29.4* 29.7* 28.8* 27.4*  MCV 97.0 97.7 98.3 100.0 98.6  PLT 240 245 231 224 378   Basic Metabolic Panel: Recent Labs  Lab 04/25/18 0418 04/26/18 0541 04/27/18 0450 04/28/18 0711 04/29/18 0424  NA 134* 135 136 136 137  K 3.9 4.0 3.4* 3.5 3.8  CL 96* 98 99 101 101  CO2 21* 19* 26 23 26   GLUCOSE 81 70 88 90 84  BUN 66* 81* 25* 38* 11  CREATININE 7.09* 7.97* 3.90* 5.44* 2.74*  CALCIUM 7.4* 7.1* 7.3* 7.2* 7.7*  PHOS 7.4* 8.6* 3.9 4.9* 2.5   GFR: Estimated Creatinine Clearance: 22.3 mL/min (A) (by C-G formula based on SCr of 2.74 mg/dL (H)). Liver Function Tests: Recent Labs  Lab 04/25/18 0418 04/26/18 0541 04/27/18 0450 04/28/18 0711 04/29/18 0424  ALBUMIN 2.3* 2.1* 2.2* 2.3* 2.7*   No results for input(s): LIPASE, AMYLASE in the last 168 hours. No results for input(s): AMMONIA in the last 168 hours. Coagulation  Profile: Recent Labs  Lab 04/25/18 1108  INR 1.51   Cardiac Enzymes: No results for input(s): CKTOTAL, CKMB, CKMBINDEX, TROPONINI in the last 168 hours. BNP (last 3 results) No results for input(s): PROBNP in the last 8760 hours. HbA1C: No results for input(s): HGBA1C in the last 72 hours. CBG: No results for input(s): GLUCAP in the last 168 hours. Lipid Profile: No results for input(s): CHOL, HDL, LDLCALC, TRIG, CHOLHDL, LDLDIRECT in the last 72 hours. Thyroid Function Tests: No results for input(s): TSH, T4TOTAL, FREET4, T3FREE, THYROIDAB in the last 72 hours. Anemia Panel: No results for input(s): VITAMINB12, FOLATE, FERRITIN, TIBC, IRON, RETICCTPCT in the last 72 hours. Urine analysis:    Component Value Date/Time   COLORURINE YELLOW 06/05/2016 0328   APPEARANCEUR CLEAR 06/05/2016 0328   LABSPEC 1.010 06/05/2016 0328   PHURINE 5.0 06/05/2016 0328   GLUCOSEU NEGATIVE 06/05/2016 0328   HGBUR TRACE (A) 06/05/2016 0328   BILIRUBINUR NEGATIVE 06/05/2016 0328   KETONESUR NEGATIVE 06/05/2016 0328   PROTEINUR NEGATIVE 06/05/2016 0328   UROBILINOGEN 0.2 02/15/2010 0845   NITRITE NEGATIVE 06/05/2016 0328   LEUKOCYTESUR NEGATIVE 06/05/2016 0328   Recent Results (from the past 240 hour(s))  Culture, blood (Routine X 2) w Reflex to ID Panel     Status: None   Collection Time: 04/21/18 12:08 PM  Result Value Ref Range Status   Specimen Description BLOOD RIGHT HAND  Final   Special Requests   Final    BOTTLES DRAWN AEROBIC ONLY Blood Culture adequate volume   Culture   Final    NO GROWTH 5 DAYS Performed at [facility], [place] [address], [place], [place] [address]    Report Status 04/26/2018 FINAL  Final  Culture, blood (Routine X 2) w Reflex to ID Panel     Status: None   Collection Time: 04/21/18 12:12 PM  Result Value Ref Range Status   Specimen Description BLOOD RIGHT HAND  Final   Special Requests   Final    BOTTLES DRAWN AEROBIC ONLY Blood Culture adequate volume     Culture   Final    NO GROWTH 5 DAYS Performed at [facility], [place] [address], [place], [place] [address]    Report Status 04/26/2018 FINAL  Final  Culture, blood (routine x 2)     Status: None   Collection Time: 04/24/18  6:00 AM  Result Value Ref Range Status   Specimen Description BLOOD RIGHT ANTECUBITAL  Final   Special Requests   Final    BOTTLES DRAWN AEROBIC AND ANAEROBIC Blood Culture adequate volume   Culture   Final    NO GROWTH 5 DAYS Performed at [facility], [place] [address], [place], [place] [address]    Report Status 04/29/2018 FINAL  Final  Culture, blood (routine x  2)     Status: None   Collection Time: 04/24/18  6:15 AM  Result Value Ref Range Status   Specimen Description BLOOD RIGHT HAND  Final   Special Requests   Final    BOTTLES DRAWN AEROBIC AND ANAEROBIC Blood Culture adequate volume   Culture   Final    NO GROWTH 5 DAYS Performed at [facility], [address], [place], [place] [address]    Report Status 04/29/2018 FINAL  Final      Radiology Studies: No results found.  Scheduled Meds: . amiodarone  200 mg Oral Daily  . aspirin EC  81 mg Oral Daily  . atorvastatin  20 mg Oral QHS  . Chlorhexidine Gluconate Cloth  6 each Topical Q0600  . clopidogrel  75 mg Oral Daily  . Darbepoetin Alfa  200 mcg Intravenous Q Tue-HD  . feeding supplement  1 Container Oral BID BM  . feeding supplement (PRO-STAT SUGAR FREE 64)  30 mL Oral Daily  . levothyroxine  50 mcg Oral QAC breakfast  . lidocaine-prilocaine   Topical Q M,W,F  . multivitamin  1 tablet Oral QHS  . sodium chloride flush  3 mL Intravenous Q12H  . sodium chloride flush  3 mL Intravenous Q12H   Continuous Infusions: . sodium chloride    . sodium chloride    . sodium chloride 10 mL/hr at 04/25/18 0635  . sodium chloride 10 mL/hr at 04/29/18 0555  . aztreonam 0.5 g (04/29/18 0949)  . dextrose Stopped (04/23/18 1100)  . ferric gluconate (FERRLECIT/NULECIT) IV Stopped  (04/28/18 2300)  . heparin 1,100 Units/hr (04/29/18 0807)  . [START ON 05/01/2018] vancomycin       LOS: 11 days   Time spent: 25 minutes.  Patrecia Pour, MD [facility] [email or web address] Password [facility] 04/29/2018, 12:42 PM

## 2018-04-29 NOTE — Progress Notes (Signed)
Patient c/o severe pain on right groin 9/10, with small amt of SS drain on dsg.noted, with small hematoma , pressure held x 10 min , morphine 2 mg given per orde ,. PA Almyra Deforest ssessed  patient's groin site. Dr Irish Lack updated. Patient resting when reassessed, monitored patient.

## 2018-04-29 NOTE — Progress Notes (Addendum)
 Progress Note  Patient Name: Troy [name] Date of Encounter: 04/29/2018  Primary Cardiologist: Brian Crenshaw, MD   Subjective   Denies any CP or SOB.   Inpatient Medications    Scheduled Meds: . amiodarone  200 mg Oral Daily  . aspirin EC  81 mg Oral Daily  . atorvastatin  20 mg Oral QHS  . Chlorhexidine Gluconate Cloth  6 each Topical Q0600  . clopidogrel  75 mg Oral Daily  . Darbepoetin Alfa  200 mcg Intravenous Q Tue-HD  . feeding supplement  1 Container Oral BID BM  . feeding supplement (PRO-STAT SUGAR FREE 64)  30 mL Oral Daily  . levothyroxine  50 mcg Oral QAC breakfast  . lidocaine-prilocaine   Topical Q M,W,F  . multivitamin  1 tablet Oral QHS  . sodium chloride flush  3 mL Intravenous Q12H  . sodium chloride flush  3 mL Intravenous Q12H   Continuous Infusions: . sodium chloride    . sodium chloride    . sodium chloride 10 mL/hr at 04/25/18 0635  . sodium chloride 10 mL/hr at 04/29/18 0555  . aztreonam Stopped (04/29/18 0259)  . dextrose Stopped (04/23/18 1100)  . ferric gluconate (FERRLECIT/NULECIT) IV Stopped (04/28/18 2300)  . heparin 1,100 Units/hr (04/29/18 0807)  . [START ON 05/01/2018] vancomycin     PRN Meds: sodium chloride, sodium chloride, acetaminophen, heparin, lidocaine, nitroGLYCERIN, ondansetron **OR** ondansetron (ZOFRAN) IV, sodium chloride flush, sodium chloride flush   Vital Signs    Vitals:   04/29/18 0027 04/29/18 0300 04/29/18 0530 04/29/18 0600  BP: (!) 104/29 (!) 135/57 (!) 94/48 (!) 135/57  Pulse: 61  66   Resp: 14  15   Temp: 98 F (36.7 C) 97.9 F (36.6 C)    TempSrc: Oral Oral    SpO2: 99%  100%   Weight: 76.7 kg 78.7 kg    Height:        Intake/Output Summary (Last 24 hours) at 04/29/2018 0810 Last data filed at 04/29/2018 0500 Gross per 24 hour  Intake 969.93 ml  Output 2004 ml  Net -1034.07 ml   Filed Weights   04/28/18 2012 04/29/18 0027 04/29/18 0300  Weight: 78.7 kg 76.7 kg 78.7 kg    Telemetry      NSR with occasional PVCs - Personally Reviewed  ECG    NSR with TWI in inferolateral leads - Personally Reviewed  Physical Exam   GEN: No acute distress.   Neck: No JVD Cardiac: RRR, no murmurs, rubs, or gallops.  Respiratory: Clear to auscultation bilaterally. GI: Soft, nontender, non-distended  MS: No edema; No deformity. Neuro:  Nonfocal  Psych: Normal affect   Labs    Chemistry Recent Labs  Lab 04/27/18 0450 04/28/18 0711 04/29/18 0424  NA 136 136 137  K 3.4* 3.5 3.8  CL 99 101 101  CO2 26 23 26  GLUCOSE 88 90 84  BUN 25* 38* 11  CREATININE 3.90* 5.44* 2.74*  CALCIUM 7.3* 7.2* 7.7*  ALBUMIN 2.2* 2.3* 2.7*  GFRNONAA 14* 9* 21*  GFRAA 16* 11* 24*  ANIONGAP 11 12 10     Hematology Recent Labs  Lab 04/27/18 1623 04/28/18 0711 04/29/18 0424  WBC 14.8* 12.4* 10.6*  RBC 3.02* 2.88* 2.78*  HGB 9.3* 8.7* 8.5*  HCT 29.7* 28.8* 27.4*  MCV 98.3 100.0 98.6  MCH 30.8 30.2 30.6  MCHC 31.3 30.2 31.0  RDW 19.0* 18.8* 18.6*  PLT 231 224 197    Cardiac EnzymesNo results for input(s):   TROPONINI in the last 168 hours. No results for input(s): TROPIPOC in the last 168 hours.   BNPNo results for input(s): BNP, PROBNP in the last 168 hours.   DDimer No results for input(s): DDIMER in the last 168 hours.   Radiology    No results found.  Cardiac Studies   Echo 04/19/2018 LV EF: 25% -   30% Study Conclusions  - Left ventricle: The cavity size was normal. Wall thickness was   increased in a pattern of mild LVH. Systolic function was   severely reduced. The estimated ejection fraction was in the   range of 25% to 30%. Diffuse hypokinesis with regional variation.   Doppler parameters are consistent with restrictive diastolic   physiology. The E&'e&' ratio is markedly elevated >20, suggesting   increased LV filling pressure. - Mitral valve: MAC with leaflet calcification. Moderate   regurgitation. - Left atrium: Severely dilated. - Right ventricle: The  cavity size was mildly dilated. Moderately   reduced systolic function. - Right atrium: The atrium was mildly dilated. - Tricuspid valve: There was mild regurgitation. - Pulmonary arteries: PA peak pressure: 41 mm Hg (S). - Inferior vena cava: The vessel was dilated. The respirophasic   diameter changes were blunted (< 50%), consistent with elevated   central venous pressure.  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   Cath 04/23/2018  Severe 2 Vessel CAD:  Ost RCA to Prox RCA lesion is 95% stenosed. Prox RCA lesion is 80% stenosed. Prox RCA to Mid RCA lesion is 99% stenosed. Mid RCA lesion is 80% stenosed.  Ost LAD lesion is 30% stenosed. Prox LAD lesion is 95% stenosed.  Mid LM to Dist LM lesion is 35% stenosed.  Ost Cx to Prox Cx lesion is 60% stenosed. - very small caliber vessel  LV end diastolic pressure is moderately elevated.   Severe 2 Vessel CAD: 85% calcified pLAD, extensive calcified 95% ostial, 80&90% prox and 80% mid (modereate caliber) RCA with 90% tiny RPAV. 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  (Would be amenable to Impella)  Recommendation: Holding area for sheath removal.  Return to 6 E. for ongoing care. With severely calcified disease in the LAD and RCA that would likely require atherectomy in her low EF, would potentially benefit from Impella support --> with the patient being DNR and relatively poor status to begin with, would need to have conversation with other interventionalists and  attending cardiologist along with family to determine the best course of action going forward. -->  Perhaps medical management with palliative care would be an option.  I do think that attempted PCI would be best served with Impella support if possible.  I will restart IV heparin 8 hours after sheath removal. Will discuss with interventional cardiology colleagues to review images and determine potential interventional options and will communicate with Dr. Crenshaw.   Patient Profile     77 y.o. male admitted with non-ST elevation myocardial infarction.  Also with history of chronic kidney disease and dialysis has now been initiated.  Also with   history of paroxysmal atrial fibrillation, chronic combined systolic/diastolic congestive heart failure, hypertension, hyperlipidemia, prior CVA, peripheral vascular disease.  Has required transfusion this admission for bleeding from AV fistula.  Echocardiogram shows newly reduced LV function with ejection fraction 25 to 30%.  There is moderate mitral regurgitation. Cath 10/16 showed significant RCA and LAD disease, plan for staged PCI. Hospitalization complicated by bleeding from L arm fistula site.   Assessment & Plan    1. NSTEMI  - myoview in 2018 was abnormal with TID and inferior ischemia.   - cath 10/16/019 95% ost RCA, 80% prox RCA, 99% prox RCA, 80% mid RCA, 95% prox LAD, 60% ost LCx . 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 FLP tomorrow AM.   2. Ischemic cardiomyopathy: EF down to 25% by echo from previous 45%  - not on BB due to borderline hypotension and occasional bradycardia.    3. 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 TTS. Bleeding from AVF  - per Dr. Brabham, poorly functioning L radiocephalic fistula, plan for fistulogram after PCI  4. PAF on amiodarone. Given age and frailty, plan ASA and plavix for 30 days following PCI with no  coumadin, after 30 days, D/C ASA and continue pavix and resume coumadin.  5. Acute systolic HF: managed through dialysis  - Echo 06/06/2016 EF 45-50%  - repeat echo this admission showed EF down to 25-30%, moderate MR, severe LAE  - euvolemic on exam  6. PAD s/p L CEA  7. 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   8. Moderate MR  9. Elevated WBC: improving after abx  For questions or updates, please contact [facility] Please consult [email or web address] for contact info under   Signed, Hao Meng, PA  04/29/2018, 8:10 AM     The patient was seen, examined and discussed with Hao Meng, PA-C and I agree with the above.   Plan for a cath/PCI today, s/p hemodialysis yesterday. The patient is asymptomatic, no recurrent chest pains. She appears euvolemic.  Romulus Hanrahan, MD 04/29/2018   

## 2018-04-29 NOTE — Progress Notes (Signed)
HD tx completed @ 0020 w/o problem UF goal met Blood rinsed back VSS Report called to Darlene, RN 

## 2018-04-30 ENCOUNTER — Inpatient Hospital Stay (HOSPITAL_COMMUNITY): Payer: Medicare Other

## 2018-04-30 ENCOUNTER — Encounter (HOSPITAL_COMMUNITY): Payer: Self-pay | Admitting: Interventional Cardiology

## 2018-04-30 DIAGNOSIS — M79661 Pain in right lower leg: Secondary | ICD-10-CM

## 2018-04-30 LAB — RENAL FUNCTION PANEL
ALBUMIN: 2.5 g/dL — AB (ref 3.5–5.0)
ANION GAP: 12 (ref 5–15)
BUN: 26 mg/dL — ABNORMAL HIGH (ref 8–23)
CALCIUM: 7.6 mg/dL — AB (ref 8.9–10.3)
CO2: 24 mmol/L (ref 22–32)
Chloride: 102 mmol/L (ref 98–111)
Creatinine, Ser: 4.29 mg/dL — ABNORMAL HIGH (ref 0.61–1.24)
GFR calc non Af Amer: 12 mL/min — ABNORMAL LOW (ref >60)
GFR, EST AFRICAN AMERICAN: 14 mL/min — AB (ref >60)
GLUCOSE: 86 mg/dL (ref 70–99)
PHOSPHORUS: 4.5 mg/dL (ref 2.5–4.6)
Potassium: 4.3 mmol/L (ref 3.5–5.1)
SODIUM: 138 mmol/L (ref 135–145)

## 2018-04-30 LAB — GLUCOSE, CAPILLARY: Glucose-Capillary: 76 mg/dL (ref 70–99)

## 2018-04-30 LAB — LIPID PANEL
CHOLESTEROL: 98 mg/dL (ref 0–200)
HDL: 26 mg/dL — AB (ref >40)
LDL Cholesterol: 41 mg/dL (ref 0–99)
TRIGLYCERIDES: 157 mg/dL — AB (ref <150)
Total CHOL/HDL Ratio: 3.8 RATIO
VLDL: 31 mg/dL (ref 0–40)

## 2018-04-30 LAB — CBC
HCT: 23.5 % — ABNORMAL LOW (ref 39.0–52.0)
HEMOGLOBIN: 7.2 g/dL — AB (ref 13.0–17.0)
MCH: 30.8 pg (ref 26.0–34.0)
MCHC: 30.6 g/dL (ref 30.0–36.0)
MCV: 100.4 fL — ABNORMAL HIGH (ref 80.0–100.0)
Platelets: 272 10*3/uL (ref 150–400)
RBC: 2.34 MIL/uL — ABNORMAL LOW (ref 4.22–5.81)
RDW: 18.8 % — AB (ref 11.5–15.5)
WBC: 16.8 10*3/uL — AB (ref 4.0–10.5)
nRBC: 0 % (ref 0.0–0.2)

## 2018-04-30 LAB — PREPARE RBC (CROSSMATCH)

## 2018-04-30 LAB — HEPATITIS B SURFACE ANTIBODY,QUALITATIVE: Hep B S Ab: NONREACTIVE

## 2018-04-30 LAB — HEMOGLOBIN AND HEMATOCRIT, BLOOD
HEMATOCRIT: 29.9 % — AB (ref 39.0–52.0)
HEMOGLOBIN: 9.3 g/dL — AB (ref 13.0–17.0)

## 2018-04-30 MED ORDER — HEPARIN SODIUM (PORCINE) 1000 UNIT/ML DIALYSIS
20.0000 [IU]/kg | INTRAMUSCULAR | Status: DC | PRN
  Administered 2018-04-30: 1600 [IU] via INTRAVENOUS_CENTRAL
  Filled 2018-04-30: qty 1.6

## 2018-04-30 MED ORDER — OXYCODONE-ACETAMINOPHEN 5-325 MG PO TABS
1.0000 | ORAL_TABLET | Freq: Four times a day (QID) | ORAL | Status: DC | PRN
  Administered 2018-04-30 – 2018-05-06 (×6): 1 via ORAL
  Filled 2018-04-30 (×6): qty 1

## 2018-04-30 MED ORDER — HEPARIN SODIUM (PORCINE) 1000 UNIT/ML IJ SOLN
INTRAMUSCULAR | Status: AC
  Administered 2018-04-30: 1600 [IU] via INTRAVENOUS_CENTRAL
  Filled 2018-04-30: qty 2

## 2018-04-30 MED ORDER — NEPRO/CARBSTEADY PO LIQD
237.0000 mL | Freq: Two times a day (BID) | ORAL | Status: DC
  Administered 2018-05-01 – 2018-05-07 (×7): 237 mL via ORAL
  Filled 2018-04-30 (×7): qty 237

## 2018-04-30 MED ORDER — HEPARIN SODIUM (PORCINE) 1000 UNIT/ML IJ SOLN
INTRAMUSCULAR | Status: AC
  Filled 2018-04-30: qty 3

## 2018-04-30 MED ORDER — SODIUM CHLORIDE 0.9% IV SOLUTION
Freq: Once | INTRAVENOUS | Status: AC
  Administered 2018-04-30: 08:00:00 via INTRAVENOUS

## 2018-04-30 MED ORDER — MORPHINE SULFATE (PF) 2 MG/ML IV SOLN
INTRAVENOUS | Status: AC
  Filled 2018-04-30: qty 1

## 2018-04-30 MED FILL — Nitroglycerin IV Soln 100 MCG/ML in D5W: INTRA_ARTERIAL | Qty: 10 | Status: AC

## 2018-04-30 NOTE — Progress Notes (Signed)
Nutrition Follow-up  DOCUMENTATION CODES:   Not applicable  INTERVENTION:    Nepro Shake po BID, each supplement provides 425 kcal and 19 grams protein  30 ml Prostat BID, each supplement provides 100 kcals and 15 grams protein.  Provide Renal MVI  NUTRITION DIAGNOSIS:   Increased nutrient needs related to acute illness, chronic illness as evidenced by estimated needs.  Ongoing  GOAL:   Patient will meet greater than or equal to 90% of their needs  Progressing  MONITOR:   PO intake, Supplement acceptance, Weight trends, Labs, I & O's  REASON FOR ASSESSMENT:   Consult Assessment of nutrition requirement/status, Diet education  ASSESSMENT:   77 y.o. 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 Patient started on HD on 10/11.   10/18- temp cath per IR 10/22- heart cath - stented LAD  Pt reports having an okay appetite due to ongoing arm right pain. Meal completions charted as 50-85% for his last five meals. He can tolerate Prostat but does not like Boost Breeze. Will attempt Nepro and monitor compliance. Labs WDL, If pt dislikes Nepro, Ensure would be appropriate.   Weight noted to decrease from 167 lb on 10/16 to 161 lb today. Unsure of EDW. Pt may benefit from repeat NFPE as his fluid status is decreasing. Unable to perform today as pt was in pain.   Provided Renal Pyramid to patient/family. Reviewed food groups and provided written recommended serving sizes specifically determined for patient's current nutritional status. Explained why diet restrictions are needed and provided  lists of foods to limit/avoid that are high potassium, sodium, and phosphorus. Provided specific recommendations on safer alternatives of these foods. Strongly encouraged compliance of this diet. Discussed importance of protein intake at each meal and snack. Provided examples of how to maximize protein intake throughout the day. Discussed need for fluid restriction with dialysis, importance of minimizing weight gain between HD treatments, and renal-friendly beverage options. Encouraged pt to discuss specific diet questions/concerns with RD at HD outpatient facility.   UOP: anuric Net UF: 226 ml   Medications reviewed and include: Rena-Vit Labs reviewed: corrected calcium 8.8 (L) PTH 63 (wdl) phosphorus 4.5 (wdl)- no binder  Diet Order:   Diet Order            Diet Heart Room service appropriate? Yes; Fluid consistency: Thin  Diet effective now              EDUCATION NEEDS:   Not appropriate for education at this time  Skin:  Skin Assessment: Reviewed RN Assessment  Last BM:  04/27/18  Height:   Ht Readings from Last 1 Encounters:  04/18/18 5\' 6"  (1.676 m)    Weight:   Wt Readings from Last 1 Encounters:  04/30/18 73.4 kg    Ideal Body Weight:  64.54 kg  BMI:  Body mass index is 26.12 kg/m.  Estimated Nutritional Needs:   Kcal:  2100-2300  Protein:  95-105 grams  Fluid:  UOP +1 L  Mariana Single RD, LDN Clinical Nutrition Pager # [phone]

## 2018-04-30 NOTE — Progress Notes (Addendum)
Patient ID: Troy [name], male   DOB: 05/28/1941, 77 y.o.   MRN: [id number] Consulted for evaluation of right groin false aneurysm status post right groin access for coronary intervention yesterday. Patient is very complex history.  Has long-term hemodialysis and was seen on 04/27/2018 by Dr. Trula Slade with planned for fistulogram of poorly functioning fistula following resolution of cardiac issues.  He was complaining of groin pain this morning and underwent duplex to rule out false aneurysm.  This did show a false aneurysm and he is seen for further evaluation.  On physical exam he does have some tenderness and minimal bruising in his right groin.  He does have a palpable popliteal and dorsalis pedis pulse which is 1+.  He is tender in his right groin and the pulse is mildly expansile.  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  Long discussion with the patient and his son and daughter-in-law present.  Have recommended treatment with compression tomorrow.  Explained that the procedure would be similar to the ultrasound that he had today to hopefully seal the small false aneurysm.  Also discussed potential need for thrombin injection and even potential for surgery which would be unlikely.  We will make the patient n.p.o. and coordinate compression tomorrow  He is on Plavix but not on anticoagulation

## 2018-04-30 NOTE — Progress Notes (Addendum)
Right arm circumference noted to be 33.5cm/13.25in , measured by [employer] nurse Delrae Rend RN

## 2018-04-30 NOTE — Progress Notes (Signed)
RLE limited arterial duplex:  An area with well defined borders measuring 1.4 cm x 0.9 cm was visualized arising off of the CFA with ultrasound characteristics of a pseudoaneurysm. The neck measures approximately 0.3 cm wide and 0.5 cm long.    Landry Mellow, RDMS, RVT   Results given to RN

## 2018-04-30 NOTE — Procedures (Signed)
Patient was seen on dialysis and the procedure was supervised.  BFR 350  Via temp cath BP is  93/28.   Patient appears to be tolerating treatment well in spite of low BP   [name] 04/30/2018

## 2018-04-30 NOTE — Progress Notes (Signed)
Patient assisted back to room & reassessed : s/p Hemodialysis and post blood tx of  1 unit PRBC  , AAO x4, rt arm ecchymosis , previous marked hematoma site extended in size, 30.5 cm arm circumference, kept elevated on pillow. C/o rt groin pain , site level 0 , soft,  no hematoma noted, sb/p  70's -90's Rhonda B notified. With orders . H/H stat requested, [place] rt groin in progress at bedside

## 2018-04-30 NOTE — Progress Notes (Addendum)
Progress Note  Patient Name: Troy [name] Date of Encounter: 04/30/2018  Primary Cardiologist: Kirk Ruths, MD   Subjective   R arm is very sore. R groin is also very sore.  Inpatient Medications    Scheduled Meds: . sodium chloride   Intravenous Once  . amiodarone  200 mg Oral Daily  . aspirin EC  81 mg Oral Daily  . atorvastatin  20 mg Oral QHS  . Chlorhexidine Gluconate Cloth  6 each Topical Q0600  . clopidogrel  75 mg Oral Daily  . Darbepoetin Alfa  200 mcg Intravenous Q Tue-HD  . feeding supplement  1 Container Oral BID BM  . feeding supplement (PRO-STAT SUGAR FREE 64)  30 mL Oral Daily  . heparin  5,000 Units Subcutaneous Q8H  . levothyroxine  50 mcg Oral QAC breakfast  . lidocaine-prilocaine   Topical Q M,W,F  . multivitamin  1 tablet Oral QHS  . sodium chloride flush  3 mL Intravenous Q12H  . sodium chloride flush  3 mL Intravenous Q12H   Continuous Infusions: . sodium chloride    . sodium chloride    . aztreonam 0.5 g (04/29/18 2217)  . ferric gluconate (FERRLECIT/NULECIT) IV Stopped (04/28/18 2300)   PRN Meds: sodium chloride, sodium chloride, acetaminophen, heparin, lidocaine, morphine injection, nitroGLYCERIN, ondansetron **OR** ondansetron (ZOFRAN) IV, sodium chloride flush, sodium chloride flush   Vital Signs    Vitals:   04/29/18 1603 04/29/18 2100 04/30/18 0414 04/30/18 0812  BP: (!) 94/38 (!) 155/24 (!) 165/23 (!) 99/36  Pulse: 67 63 66 66  Resp: 15 15 16 18   Temp: 98.1 F (36.7 C) (!) 97.5 F (36.4 C) 97.6 F (36.4 C) 98 F (36.7 C)  TempSrc: Oral Oral Oral   SpO2: 98% 98% 99% 97%  Weight:   78 kg   Height:        Intake/Output Summary (Last 24 hours) at 04/30/2018 9163 Last data filed at 04/29/2018 2217 Gross per 24 hour  Intake 290 ml  Output -  Net 290 ml   Filed Weights   04/29/18 0027 04/29/18 0300 04/30/18 0414  Weight: 76.7 kg 78.7 kg 78 kg    Telemetry    SR, seen in HD- Personally Reviewed  ECG    SR,  inferolateral Twave changes slightly different from 10/22  - Personally Reviewed  Physical Exam   General: Well developed, well nourished, male in no acute distress Head: Eyes PERRLA, No xanthomas.   Normocephalic and atraumatic, edentulous Lungs: Clear bilaterally to auscultation. Heart: HRRR S1 S2, without RG. Short SEM.  No JVD. Abdomen: Bowel sounds are present, abdomen soft and non-tender without masses or  hernias noted. Msk: Normal strength and tone for age. Extremities: No clubbing, cyanosis or edema. RUE w/ extensive swelling and ecchymosis and hematoma inner upper arm is marked, tender to palpation.  R groin w/ small hematoma, very tender. No bruit noted but pulse is hard to find, cap refill ok  Skin:  No rashes or lesions noted. Neuro: Alert and oriented X 3. Psych:  Good affect, responds appropriately   Labs    Chemistry Recent Labs  Lab 04/28/18 0711 04/29/18 0424 04/30/18 0343  NA 136 137 138  K 3.5 3.8 4.3  CL 101 101 102  CO2 23 26 24   GLUCOSE 90 84 86  BUN 38* 11 26*  CREATININE 5.44* 2.74* 4.29*  CALCIUM 7.2* 7.7* 7.6*  ALBUMIN 2.3* 2.7* 2.5*  GFRNONAA 9* 21* 12*  GFRAA 11* 24* 14*  ANIONGAP 12 10 12     Lab Results  Component Value Date   ALT 18 07/23/2017   AST 24 07/23/2017   ALKPHOS 76 07/23/2017   BILITOT 0.3 07/23/2017     Hematology Recent Labs  Lab 04/28/18 0711 04/29/18 0424 04/30/18 0343  WBC 12.4* 10.6* 16.8*  RBC 2.88* 2.78* 2.34*  HGB 8.7* 8.5* 7.2*  HCT 28.8* 27.4* 23.5*  MCV 100.0 98.6 100.4*  MCH 30.2 30.6 30.8  MCHC 30.2 31.0 30.6  RDW 18.8* 18.6* 18.8*  PLT 224 197 272   Lab Results  Component Value Date   CHOL 98 04/30/2018   HDL 26 (L) 04/30/2018   LDLCALC 41 04/30/2018   TRIG 157 (H) 04/30/2018   CHOLHDL 3.8 04/30/2018     Radiology    No results found.  Cardiac Studies   Echo 04/19/2018 LV EF: 25% -   30% Study Conclusions  - Left ventricle: The cavity size was normal. Wall thickness was    increased in a pattern of mild LVH. Systolic function was   severely reduced. The estimated ejection fraction was in the   range of 25% to 30%. Diffuse hypokinesis with regional variation.   Doppler parameters are consistent with restrictive diastolic   physiology. The E&'e&' ratio is markedly elevated >20, suggesting   increased LV filling pressure. - Mitral valve: MAC with leaflet calcification. Moderate   regurgitation. - Left atrium: Severely dilated. - Right ventricle: The cavity size was mildly dilated. Moderately   reduced systolic function. - Right atrium: The atrium was mildly dilated. - Tricuspid valve: There was mild regurgitation. - Pulmonary arteries: PA peak pressure: 41 mm Hg (S). - Inferior vena cava: The vessel was dilated. The respirophasic   diameter changes were blunted (< 50%), consistent with elevated   central venous pressure.  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   Cath 04/23/2018  Severe 2 Vessel CAD:  Ost RCA to Prox RCA lesion is 95% stenosed. Prox RCA lesion is 80% stenosed. Prox RCA to Mid RCA lesion is 99% stenosed. Mid RCA lesion is 80% stenosed.  Ost LAD lesion is 30% stenosed. Prox LAD lesion is 95% stenosed.  Mid LM to Dist LM lesion is 35% stenosed.  Ost Cx to Prox Cx lesion is 60% stenosed. - very small caliber vessel  LV end diastolic pressure is moderately elevated.   Severe 2 Vessel CAD: 85% calcified pLAD, extensive calcified 95% ostial, 80&90% prox and 80% mid (modereate caliber) RCA with 90% tiny RPAV. 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 Moderately elevated LVEDP despite borderline systemic hypotension Bilateral renal artery disease with at least 70% right 90% left renal artery stenosis noted on abdominal  aorta angiogram but otherwise minimal aortoiliac disease other than tortuous.  (Would be amenable to Impella)  Recommendation: Holding area for sheath removal.  Return to 6 E. for ongoing care. With severely calcified disease in the LAD and RCA that would likely require atherectomy in her low EF, would potentially benefit from Impella support --> with the patient being DNR and relatively poor status to begin with, would need to have conversation with other interventionalists and attending cardiologist along with family to determine the best course of action going forward. -->  Perhaps medical management  with palliative care would be an option.  I do think that attempted PCI would be best served with Impella support if possible.  I will restart IV heparin 8 hours after sheath removal. Will discuss with interventional cardiology colleagues to review images and determine potential interventional options and will communicate with Dr. Stanford Breed.  PCI: 04/29/2018  Mid LM to Dist LM lesion is 35% stenosed.  Ost LAD lesion is 30% stenosed.  Prox LAD lesion is 95% stenosed.  A drug-eluting stent was successfully placed using a STENT SYNERGY DES 3X32.  Post intervention, there is a 0% residual stenosis.  Ost Cx to Prox Cx lesion is 60% stenosed.  Ost RCA to Prox RCA lesion is 95% stenosed.  Prox RCA lesion is 80% stenosed.  Prox RCA to Mid RCA lesion is 99% stenosed.  Mid RCA lesion is 80% stenosed.  LV end diastolic pressure is normal. LVEDP 12 mm Hg.  There is no aortic valve stenosis. Recommend uninterrupted dual antiplatelet therapy with Aspirin 53m daily and Clopidogrel 716mdaily for a minimum of 12 months (ACS - Class I recommendation).   Patient was preloaded with Plavix and this was continued.  Collaterals to the RCA already developing.  Would treat medically for now.  RCA PCI would be higher risk given the long, calcified, diffuse disease.   Patient Profile     [age].o. male  admitted with non-ST elevation myocardial infarction.  Also with history of chronic kidney disease and dialysis has now been initiated.  Also with history of paroxysmal atrial fibrillation, chronic combined systolic/diastolic congestive heart failure, hypertension, hyperlipidemia, prior CVA, peripheral vascular disease.  Has required transfusion this admission for bleeding from AV fistula.  Echocardiogram shows newly reduced LV function with ejection fraction 25 to 30%.  There is moderate mitral regurgitation. Cath 10/16 showed significant RCA and LAD disease, plan for staged PCI. Hospitalization complicated by bleeding from L arm fistula site.   Assessment & Plan    1. NSTEMI - s/p DES LAD, ASA & Plavix x 30 days, then Plavix - Residual dz RCA at 95%, rx medically - no CP now, continue to follow for this. - LDL is at goal, HDL low  2. Ischemic cardiomyopathy: EF down to 25% by echo from previous 45% - no BB due to BP problems and hx bradycardia - discuss w/ MD adding ACE/ARB since he is already on HD, take on non-HD days  3. 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 PAF on amiodarone.  - continue Amio - w/ age/frailty, will not do triple drug therapy - start coumadin once pt has completed a month of ASA and is on Plavix alone  5. Acute systolic HF: managed through dialysis - volume mgt per HD - EF 45-50%>>25-30% - mod MR, severe LAE  6. PAD s/p L CEA - no sx  7. 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   8. Moderate MR - follow clinically  9. Elevated WBC:  - ABX per IM  10. R groin pain - ck [place]For questions or updates, please contact [facility] consult [email or web address] for contact info  under   Signed, RhRosaria FerriesPA-C  04/30/2018, 9:07 AM  The patient was seen, examined and discussed with Rosaria Ferries, PA-C and I agree with the above.   The patient with known CAD, end-stage renal disease on hemodialysis, ischemic cardiomyopathy, moderate mitral regurgitation, who was admitted with non-STEMI and underwent left cardiac catheterization with stenting of LAD yesterday, there is residual disease and RCA that will be treated medically.  Patient has chronic anemia secondary to chronic kidney disease however now worsening from baseline of 8.5-7.2, this will need to be followed closely as patient requires dual antiplatelet therapy for 30 days when we will discontinue aspirin and continue Plavix only.  Patient is undergoing hemodialysis today, blood pressure down from 1 60-100, I would observe patient until tomorrow and if stable discharge then.  Ena Dawley, MD 04/30/2018

## 2018-04-30 NOTE — Progress Notes (Signed)
CARDIAC REHAB PHASE I   Pt educated on importance of ASA, Plavix and NTG. Pt given MI book and CHF booklet. Reviewed restrictions. Transport in room to take pt to HD. Will continue to follow and educate.  [phone] Rufina Falco, RN BSN 04/30/2018 8:33 AM

## 2018-04-30 NOTE — Progress Notes (Signed)
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 Vascular consult done per above practitioner. , site monitored , endorsed to incoming RN.

## 2018-04-30 NOTE — Progress Notes (Signed)
Ultrasound at bedside at this time.

## 2018-04-30 NOTE — Progress Notes (Addendum)
PT Cancellation Note  Patient Details Name: Troy [name] MRN: [id number] DOB: February 05, 1941   Cancelled Treatment:    Reason Eval/Treat Not Completed: Patient at procedure or test/unavailable. Pt off floor for HD. Recognize that updated PT notes are needed for insurance authorization for SNF. Will follow-up for PT treatment as schedule permits.  9:29WK: pt just now back from HD treatment session. Reporting too fatigued to participate, but may be able to later this afternoon. Will follow-up as schedule permits.  4:62MM: pt limited by pain/fatigue. Also noted increased groin hematoma size with ultrasound characteristics of a pseudoaneurysm. Will follow-up tomorrow.  Mabeline Caras, PT, DPT Acute Rehabilitation Services  Pager [phone] Office [place] 04/30/2018, 9:28 AM

## 2018-04-30 NOTE — Progress Notes (Signed)
Subjective:  Had heart cath yest- stented LAD- has bruise and pain in groin - seen on HD  Objective Vital signs in last 24 hours: Vitals:   04/30/18 0828 04/30/18 0849 04/30/18 0850 04/30/18 0945  BP: (!) 102/23 (!) 97/28 (!) 104/30 (!) (P) 100/30  Pulse: 66 65 61 (P) 64  Resp: 13   (P) 17  Temp: (!) 97.5 F (36.4 C)   (P) 98 F (36.7 C)  TempSrc: Oral   (P) Oral  SpO2: 95%     Weight: 73.7 kg     Height:       Weight change: -0.7 kg  Intake/Output Summary (Last 24 hours) at 04/30/2018 0959 Last data filed at 04/30/2018 9476 Gross per 24 hour  Intake 605 ml  Output -  Net 605 ml    Assessment/ Plan: Pt is a 77 y.o. yo male who was admitted on 04/18/2018 with new start to HD- hosp complicated by worsening cardiac status/Afib, cath showing CAD needs intervention- also poorly functioning AVF  Assessment/Plan: 1. Renal- new start to HD- now running MWF- has OP spot at [facility] upon D/C.  Access issues- bleeding from AVF- difficult to cannulate.  Now s/p temp cath per IR on 10/18.  VVS on board to do fistulogram and possible TDC if AVF not felt to be usable. Will tentatively plan for next HD on Fri.  Has actually tolerated dialysis pretty well considering....   2. CAD/afib/CM- cath showing CAD- s/p stenting to LAD per cards  3. Anemia- CKD and ongoing bleeding - ESA and iron being given- drifting down- giving blood today with HD 4. Secondary hyperparathyroidism- PTH 63- I think was on vit D as OP- phos now 4.5 no binder  5. HTN/volume- BP soft, amiodarone- not on any BP meds   Troy [name]    Labs: Basic Metabolic Panel: Recent Labs  Lab 04/28/18 0711 04/29/18 0424 04/30/18 0343  NA 136 137 138  K 3.5 3.8 4.3  CL 101 101 102  CO2 23 26 24   GLUCOSE 90 84 86  BUN 38* 11 26*  CREATININE 5.44* 2.74* 4.29*  CALCIUM 7.2* 7.7* 7.6*  PHOS 4.9* 2.5 4.5   Liver Function Tests: Recent Labs  Lab 04/28/18 0711 04/29/18 0424 04/30/18 0343  ALBUMIN 2.3* 2.7* 2.5*   No  results for input(s): LIPASE, AMYLASE in the last 168 hours. No results for input(s): AMMONIA in the last 168 hours. CBC: Recent Labs  Lab 04/27/18 0450 04/27/18 1623 04/28/18 0711 04/29/18 0424 04/30/18 0343  WBC 14.8* 14.8* 12.4* 10.6* 16.8*  HGB 9.3* 9.3* 8.7* 8.5* 7.2*  HCT 29.4* 29.7* 28.8* 27.4* 23.5*  MCV 97.7 98.3 100.0 98.6 100.4*  PLT 245 231 224 197 272   Cardiac Enzymes: No results for input(s): CKTOTAL, CKMB, CKMBINDEX, TROPONINI in the last 168 hours. CBG: Recent Labs  Lab 04/29/18 1905  GLUCAP 102*    Iron Studies: No results for input(s): IRON, TIBC, TRANSFERRIN, FERRITIN in the last 72 hours. Studies/Results: No results found. Medications: Infusions: . sodium chloride    . sodium chloride    . aztreonam 0.5 g (04/29/18 2217)  . ferric gluconate (FERRLECIT/NULECIT) IV Stopped (04/28/18 2300)    Scheduled Medications: . sodium chloride   Intravenous Once  . amiodarone  200 mg Oral Daily  . aspirin EC  81 mg Oral Daily  . atorvastatin  20 mg Oral QHS  . Chlorhexidine Gluconate Cloth  6 each Topical Q0600  . clopidogrel  75 mg Oral Daily  .  Darbepoetin Alfa  200 mcg Intravenous Q Tue-HD  . feeding supplement  1 Container Oral BID BM  . feeding supplement (PRO-STAT SUGAR FREE 64)  30 mL Oral Daily  . heparin      . heparin  5,000 Units Subcutaneous Q8H  . levothyroxine  50 mcg Oral QAC breakfast  . lidocaine-prilocaine   Topical Q M,W,F  . multivitamin  1 tablet Oral QHS  . sodium chloride flush  3 mL Intravenous Q12H  . sodium chloride flush  3 mL Intravenous Q12H    have reviewed scheduled and prn medications.  Physical Exam: General: alert, pleaasant Heart: brady Lungs: mostly clear Abdomen: soft, non tender Extremities: min edema Dialysis Access: right sided temp cath placed 10/18 -not bleeding- left AVF wrapped but can appreciate bruit    04/30/2018,9:59 AM  LOS: 12 days

## 2018-04-30 NOTE — Progress Notes (Signed)
PROGRESS NOTE    Troy [name]  IHW:[id number] DOB: [birth year] DOA: 04/18/2018 PCP: Gaynelle Arabian, MD    Brief Narrative:  77 y.o. 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 Also noted to have progressive renal failure with uremia for which nephrology was consulted and began hemodialysis 10/11 through remotely placed left RC AVF. An outpatient HD chair has been arranged.   Echocardiogram demonstrated worsening of LV dysfunction (EF 45-50% 2017 now 25-30%). Cardiology was consulted, recommending heart catheterization which the patient and family consented to after discussions with palliative care consultants. Left heart catheterization on 10/16 after vitamin K given for supratherapeutic INR showed LAD and RCA disease, though only LAD lesion was felt to be amenable to stenting. On 10/17 he developed erythema proximal to fistula site for which vancomycin and aztreonam were started empirically. Stenting to LAD was scheduled but postponed due to lack of dialysis access (fistula not able to be cannulated). IR placed dialysis catheter in right IJ for HD, though this has had recurrent bleeding issues for which dermabond and suturing were performed 10/20 with stability. Course further complicated by anemia due to bleeding and hematoma formation on the right arm (U/S ruled out clot). LHC planned 10/22 to be followed by fistulogram. Ultimate SNF disposition planned.  Assessment & Plan:   Principal Problem:   Acute on chronic systolic heart failure (HCC) Active Problems:   CKD (chronic kidney disease), stage V (HCC)   ESRD (end stage  renal disease) (HCC)   Leukocytosis   Atypical chest pain   Paroxysmal A-fib (HCC)   NSTEMI (non-ST elevated myocardial infarction) [facility])   Myocardial infarction [facility])   Palliative care by specialist   DNR (do not resuscitate) discussion  NSTEMI: With hx abnormal stress testing Jan 2018 not followed by LHC due to CKD.  - LHC 04/23/2018 showed LAD and RCA lesions, plan PCI to LAD 10/22 (delayed due to fistula issues) with further plan based on clinical course (aggressive medical management vs. staged PCI to RCA). - Pt on heparin gtt, loaded with plavix, has continued DAPT, statin, and antihypertensives/antianginals as BP will tolerate. Per Cardiology note, plan would by to continue DAPT x30 days without coumadin, then stop aspirin, continue plavix and restart coumadin.  Uncontrolled bleeding: Difficult situation in patient that requires heparin for confirmed NSTEMI. Qualitative platelet dysfunction likely contributing.  - Aim for low therapeutic goal of heparin (0.3-0.5).  - Appreciate ongoing evaluations by IR. Sutured and dermabonded 10/20 without evidence of bleeding/hematoma.  - Continued with anticoagulation as tolerated.  -Hgb down to 7.2 today (1gm drop). Will give 1 unit PRBC transfusion on HD  Chronic combined HFrEF: EF worsened to 25-30%.  - Manage volume with HD.  - Otherwise on guideline directed therapy as BP allows -Stable at present.   Sinus bradycardia: Does not seem to be symptomatic.  - BB on hold at present  ESRD: CLIP process completed, has spot at [facility] MWF at 12:15pm.  - Dialysis per nephrology last 10/21 -Planned fistulogram per below  AVF difficulty cannulation: Patent on U/S 10/20.  - HD thru right IJ. - Vascular surgery consulted, planning fistulogram following PCI. -  RUE seems more swollen and ecchymotic compared to LUE -Vascular surgery on board. Will repeat CBC, continue to transfuse as tolerated  Anemia of CKD and acute blood loss: s/p 1u PRBCs  10/11, 2u 10/12, 1 units 10/23  in setting of AVF bleeding and supratherapeutic INR, later had IJ cath site bleeding seems to have improved but now with significant hematoma/ecchymosis on right arm possibly related to blood draw. - Repeat CBC in AM - EPO per nephrology  Left arm cellulitis vs. fistula infection: WBC climbed 19 > 30, improving slowly with antibiotics.  - Initial blood cultures negative, repeat is negative at 4 days. - Given empiric vancomycin, aztreonam for total of 7 days  Right arm edema, hematoma, extravasation: No SVT or DVT on U/S. Unable to DC blood thinners - Elevate above the level of the heart -Repeat CBC and continue to transfuse as tolerated.   Acute toxic metabolic encephalopathy: Due to uremia. Resolved  Paroxysmal AFib:  - Continue amiodarone. Currently bradycardic so holding BB. - Continued on anticoagulation per above per Cardiology -Continue to follow CBC trends  Supratherapeutic INR: Resolved s/p vit K.  PVD, carotid stenosis: s/p CEA 2011.  - Continue ASA, statin  Hypothyroidism: Chronic, stable.  - Continue synthroid 36mcg - Stable at present  DVT prophylaxis: Heparin subq Code Status: Full Family Communication: Pt in room, family not at bedside Disposition Plan: Uncertain at this time  Consultants:   Cardiology  Nephrology  Vascular Surgery  Procedures:   [facility] 04/23/2018  Antimicrobials: Anti-infectives (From admission, onward)   Start     Dose/Rate Route Frequency Ordered Stop   05/01/18 1200  vancomycin (VANCOCIN) IVPB 750 mg/150 ml premix  Status:  Discontinued     750 mg 150 mL/hr over 60 Minutes Intravenous Every T-Th-Sa (Hemodialysis) 04/28/18 2115 04/29/18 1248   04/29/18 2200  aztreonam (AZACTAM) 0.5 g in dextrose 5 % 50 mL IVPB     0.5 g 100 mL/hr over 30 Minutes Intravenous Every 12 hours 04/29/18 1248 05/01/18 0959   04/29/18 1200  vancomycin (VANCOCIN) IVPB 750 mg/150 ml premix  Status:  Discontinued      750 mg 150 mL/hr over 60 Minutes Intravenous Every T-Th-Sa (Hemodialysis) 04/27/18 1037 04/28/18 2115   04/28/18 2200  vancomycin (VANCOCIN) IVPB 750 mg/150 ml premix     750 mg 150 mL/hr over 60 Minutes Intravenous  Once 04/28/18 2114 04/29/18 0034   04/28/18 1200  vancomycin (VANCOCIN) IVPB 750 mg/150 ml premix  Status:  Discontinued     750 mg 150 mL/hr over 60 Minutes Intravenous Every M-W-F (Hemodialysis) 04/25/18 1007 04/27/18 1037   04/26/18 1731  Vancomycin (VANCOCIN) 750-5 MG/150ML-% IVPB    Note to Pharmacy:  Alex Gardener   : cabinet override      04/26/18 1731 04/27/18 0544   04/26/18 1200  vancomycin (VANCOCIN) IVPB 750 mg/150 ml premix     750 mg 150 mL/hr over 60 Minutes Intravenous Every Sat (Hemodialysis) 04/26/18 1049 04/26/18 1900   04/25/18 1200  vancomycin (VANCOCIN) IVPB 750 mg/150 ml premix  Status:  Discontinued     750 mg 150 mL/hr over 60 Minutes Intravenous Every M-W-F (Hemodialysis) 04/24/18 0545 04/25/18 1007   04/24/18 0630  aztreonam (AZACTAM) 0.5 g in dextrose 5 % 50 mL IVPB  Status:  Discontinued     0.5 g 100 mL/hr over 30 Minutes Intravenous Every 12 hours 04/24/18 0552 04/29/18 1248   04/24/18 0600  vancomycin (VANCOCIN) 1,250 mg in sodium chloride 0.9 % 250 mL  IVPB     1,250 mg 166.7 mL/hr over 90 Minutes Intravenous  Once 04/24/18 0545 04/24/18 0808       Subjective: Complaining of RUE pain this AM  Objective: Vitals:   04/30/18 1515 04/30/18 1530 04/30/18 1545 04/30/18 1600  BP: (!) 87/62 (!) 70/27 (!) 87/25 103/88  Pulse: 65 67 68 64  Resp: 20 13 20 13   Temp: 98 F (36.7 C)     TempSrc: Oral     SpO2: 96% 97% 96% 92%  Weight:      Height:        Intake/Output Summary (Last 24 hours) at 04/30/2018 1824 Last data filed at 04/30/2018 1300 Gross per 24 hour  Intake 725 ml  Output 226 ml  Net 499 ml   Filed Weights   04/30/18 0414 04/30/18 0828 04/30/18 1251  Weight: 78 kg 73.7 kg 73.4 kg    Examination:  General exam:  Appears calm and comfortable  Respiratory system: Clear to auscultation. Respiratory effort normal. Cardiovascular system: S1 & S2 heard, RRR.  Gastrointestinal system: Abdomen is nondistended, soft and nontender. No organomegaly or masses felt. Normal bowel sounds heard. Central nervous system: Alert and oriented. No focal neurological deficits. Extremities: Symmetric 5 x 5 power, RUE with ecchymosis, increased swelling > LUE Skin: No rashes, lesions Psychiatry: Judgement and insight appear normal. Mood & affect appropriate.   Data Reviewed: I have personally reviewed following labs and imaging studies  CBC: Recent Labs  Lab 04/27/18 0450 04/27/18 1623 04/28/18 0711 04/29/18 0424 04/30/18 0343 04/30/18 1520  WBC 14.8* 14.8* 12.4* 10.6* 16.8*  --   HGB 9.3* 9.3* 8.7* 8.5* 7.2* 9.3*  HCT 29.4* 29.7* 28.8* 27.4* 23.5* 29.9*  MCV 97.7 98.3 100.0 98.6 100.4*  --   PLT 245 231 224 197 272  --    Basic Metabolic Panel: Recent Labs  Lab 04/26/18 0541 04/27/18 0450 04/28/18 0711 04/29/18 0424 04/30/18 0343  NA 135 136 136 137 138  K 4.0 3.4* 3.5 3.8 4.3  CL 98 99 101 101 102  CO2 19* 26 23 26 24   GLUCOSE 70 88 90 84 86  BUN 81* 25* 38* 11 26*  CREATININE 7.97* 3.90* 5.44* 2.74* 4.29*  CALCIUM 7.1* 7.3* 7.2* 7.7* 7.6*  PHOS 8.6* 3.9 4.9* 2.5 4.5   GFR: Estimated Creatinine Clearance: 13 mL/min (A) (by C-G formula based on SCr of 4.29 mg/dL (H)). Liver Function Tests: Recent Labs  Lab 04/26/18 0541 04/27/18 0450 04/28/18 0711 04/29/18 0424 04/30/18 0343  ALBUMIN 2.1* 2.2* 2.3* 2.7* 2.5*   No results for input(s): LIPASE, AMYLASE in the last 168 hours. No results for input(s): AMMONIA in the last 168 hours. Coagulation Profile: Recent Labs  Lab 04/25/18 1108  INR 1.51   Cardiac Enzymes: No results for input(s): CKTOTAL, CKMB, CKMBINDEX, TROPONINI in the last 168 hours. BNP (last 3 results) No results for input(s): PROBNP in the last 8760 hours. HbA1C: No  results for input(s): HGBA1C in the last 72 hours. CBG: Recent Labs  Lab 04/29/18 1905 04/30/18 1235  GLUCAP 102* 76   Lipid Profile: Recent Labs    04/30/18 0343  CHOL 98  HDL 26*  LDLCALC 41  TRIG 157*  CHOLHDL 3.8   Thyroid Function Tests: No results for input(s): TSH, T4TOTAL, FREET4, T3FREE, THYROIDAB in the last 72 hours. Anemia Panel: No results for input(s): VITAMINB12, FOLATE, FERRITIN, TIBC, IRON, RETICCTPCT in the last 72 hours. Sepsis Labs: No results for input(s): PROCALCITON, LATICACIDVEN in the last  168 hours.  Recent Results (from the past 240 hour(s))  Culture, blood (Routine X 2) w Reflex to ID Panel     Status: None   Collection Time: 04/21/18 12:08 PM  Result Value Ref Range Status   Specimen Description BLOOD RIGHT HAND  Final   Special Requests   Final    BOTTLES DRAWN AEROBIC ONLY Blood Culture adequate volume   Culture   Final    NO GROWTH 5 DAYS Performed at [facility], [address], [place], [place] [address]    Report Status 04/26/2018 FINAL  Final  Culture, blood (Routine X 2) w Reflex to ID Panel     Status: None   Collection Time: 04/21/18 12:12 PM  Result Value Ref Range Status   Specimen Description BLOOD RIGHT HAND  Final   Special Requests   Final    BOTTLES DRAWN AEROBIC ONLY Blood Culture adequate volume   Culture   Final    NO GROWTH 5 DAYS Performed at [facility], [place] [address], [place], [place] [address]    Report Status 04/26/2018 FINAL  Final  Culture, blood (routine x 2)     Status: None   Collection Time: 04/24/18  6:00 AM  Result Value Ref Range Status   Specimen Description BLOOD RIGHT ANTECUBITAL  Final   Special Requests   Final    BOTTLES DRAWN AEROBIC AND ANAEROBIC Blood Culture adequate volume   Culture   Final    NO GROWTH 5 DAYS Performed at [facility], [place] [address], [place], [place] [address]    Report Status 04/29/2018 FINAL  Final  Culture, blood (routine x 2)     Status:  None   Collection Time: 04/24/18  6:15 AM  Result Value Ref Range Status   Specimen Description BLOOD RIGHT HAND  Final   Special Requests   Final    BOTTLES DRAWN AEROBIC AND ANAEROBIC Blood Culture adequate volume   Culture   Final    NO GROWTH 5 DAYS Performed at [facility], [place] [address] [place], [place] [address]    Report Status 04/29/2018 FINAL  Final     Radiology Studies: No results found.  Scheduled Meds: . amiodarone  200 mg Oral Daily  . aspirin EC  81 mg Oral Daily  . atorvastatin  20 mg Oral QHS  . Chlorhexidine Gluconate Cloth  6 each Topical Q0600  . clopidogrel  75 mg Oral Daily  . Darbepoetin Alfa  200 mcg Intravenous Q Tue-HD  . [START ON 05/01/2018] feeding supplement (NEPRO CARB STEADY)  237 mL Oral BID BM  . feeding supplement (PRO-STAT SUGAR FREE 64)  30 mL Oral Daily  . heparin  5,000 Units Subcutaneous Q8H  . levothyroxine  50 mcg Oral QAC breakfast  . lidocaine-prilocaine   Topical Q M,W,F  . multivitamin  1 tablet Oral QHS  . sodium chloride flush  3 mL Intravenous Q12H  . sodium chloride flush  3 mL Intravenous Q12H   Continuous Infusions: . sodium chloride    . sodium chloride    . aztreonam Stopped (04/30/18 1435)  . ferric gluconate (FERRLECIT/NULECIT) IV 125 mg (04/30/18 1214)     LOS: 12 days   Marylu Lund, MD [facility] Pager On Amion  If 7PM-7AM, please contact night-coverage 04/30/2018, 6:24 PM

## 2018-05-01 ENCOUNTER — Inpatient Hospital Stay (HOSPITAL_COMMUNITY): Payer: Medicare Other

## 2018-05-01 DIAGNOSIS — I729 Aneurysm of unspecified site: Secondary | ICD-10-CM

## 2018-05-01 DIAGNOSIS — I724 Aneurysm of artery of lower extremity: Secondary | ICD-10-CM

## 2018-05-01 LAB — RENAL FUNCTION PANEL
ANION GAP: 8 (ref 5–15)
Albumin: 2.3 g/dL — ABNORMAL LOW (ref 3.5–5.0)
BUN: 17 mg/dL (ref 8–23)
CHLORIDE: 101 mmol/L (ref 98–111)
CO2: 27 mmol/L (ref 22–32)
Calcium: 7.6 mg/dL — ABNORMAL LOW (ref 8.9–10.3)
Creatinine, Ser: 2.98 mg/dL — ABNORMAL HIGH (ref 0.61–1.24)
GFR calc non Af Amer: 19 mL/min — ABNORMAL LOW (ref >60)
GFR, EST AFRICAN AMERICAN: 22 mL/min — AB (ref >60)
GLUCOSE: 96 mg/dL (ref 70–99)
POTASSIUM: 3.8 mmol/L (ref 3.5–5.1)
Phosphorus: 2.9 mg/dL (ref 2.5–4.6)
SODIUM: 136 mmol/L (ref 135–145)

## 2018-05-01 LAB — CBC
HEMATOCRIT: 25.9 % — AB (ref 39.0–52.0)
HEMOGLOBIN: 8.1 g/dL — AB (ref 13.0–17.0)
MCH: 30.6 pg (ref 26.0–34.0)
MCHC: 31.3 g/dL (ref 30.0–36.0)
MCV: 97.7 fL (ref 80.0–100.0)
NRBC: 0 % (ref 0.0–0.2)
PLATELETS: 228 10*3/uL (ref 150–400)
RBC: 2.65 MIL/uL — AB (ref 4.22–5.81)
RDW: 19 % — ABNORMAL HIGH (ref 11.5–15.5)
WBC: 14.7 10*3/uL — ABNORMAL HIGH (ref 4.0–10.5)

## 2018-05-01 MED ORDER — MORPHINE SULFATE (PF) 2 MG/ML IV SOLN
2.0000 mg | INTRAVENOUS | Status: AC | PRN
  Administered 2018-05-01 (×2): 1 mg via INTRAVENOUS
  Administered 2018-05-05: 2 mg via INTRAVENOUS
  Filled 2018-05-01: qty 1

## 2018-05-01 MED ORDER — NEOMYCIN-POLYMYXIN-DEXAMETH 3.5-10000-0.1 OP SUSP: 1.0000 [drp] | Freq: Four times a day (QID) | OPHTHALMIC | Status: DC

## 2018-05-01 MED ORDER — HEPARIN SODIUM (PORCINE) 5000 UNIT/ML IJ SOLN
5000.0000 [IU] | Freq: Three times a day (TID) | INTRAMUSCULAR | Status: DC
  Administered 2018-05-01 – 2018-05-02 (×2): 5000 [IU] via SUBCUTANEOUS
  Filled 2018-05-01 (×2): qty 1

## 2018-05-01 MED ORDER — BACITRACIN-POLYMYXIN B 500-10000 UNIT/GM OP OINT
TOPICAL_OINTMENT | Freq: Four times a day (QID) | OPHTHALMIC | Status: DC
  Administered 2018-05-01 – 2018-05-02 (×6): via OPHTHALMIC
  Administered 2018-05-03: 1 via OPHTHALMIC
  Administered 2018-05-03 – 2018-05-06 (×7): via OPHTHALMIC
  Administered 2018-05-07: 1 via OPHTHALMIC
  Administered 2018-05-07 – 2018-05-08 (×3): via OPHTHALMIC
  Filled 2018-05-01: qty 3.5

## 2018-05-01 MED ORDER — CHLORHEXIDINE GLUCONATE CLOTH 2 % EX PADS: 6.0000 | MEDICATED_PAD | Freq: Every day | CUTANEOUS | Status: DC

## 2018-05-01 NOTE — Progress Notes (Signed)
OT Cancellation Note  Patient Details Name: [name] MRN: [id number] DOB: [birth year]   Cancelled Treatment:    Reason Eval/Treat Not Completed: Medical issues which prohibited therapy Per chart review, pt with R groin pseudoaneurysm being treated with compression, requiring bed rest for 4-6 hours. Will continue with OT POC as pt medically appropriate and available.  Zenovia Jarred, MSOT, OTR/L Behavioral Health OT/ Acute Relief OT Office: [phone]  Zenovia Jarred 05/01/2018, 3:04 PM

## 2018-05-01 NOTE — Progress Notes (Signed)
CSW continuing to follow for disposition planning. Noted patient's medical acuity and not yet ready for discharge. Plan will be for SNF when patient medically stable. CSW to follow.  Estanislado Emms, [place]

## 2018-05-01 NOTE — Progress Notes (Signed)
Successful closure in the right groin pseudodaneuysm after compression for 15 minutes.   Troy [name] 05/01/2018 11:40 AM

## 2018-05-01 NOTE — Progress Notes (Signed)
Subjective:  Events noted had psuedoanurysm that required compression.  HD yest with min volume removal but tolerated well   Objective Vital signs in last 24 hours: Vitals:   04/30/18 2156 04/30/18 2157 05/01/18 0350 05/01/18 0732  BP: (!) 179/28 (!) 149/34 (!) 150/47 (!) 124/34  Pulse:   64 71  Resp:   10 19  Temp:   97.8 F (36.6 C) 98.2 F (36.8 C)  TempSrc:   Oral Oral  SpO2:   98% 97%  Weight:   73.4 kg   Height:       Weight change: -4.3 kg  Intake/Output Summary (Last 24 hours) at 05/01/2018 1137 Last data filed at 04/30/2018 1300 Gross per 24 hour  Intake 120 ml  Output 226 ml  Net -106 ml    Assessment/ Plan: Pt is a 77 y.o. yo male who was admitted on 04/18/2018 with new start to HD- hosp complicated by worsening cardiac status/Afib, cath showing CAD needs intervention- also poorly functioning AVF  Assessment/Plan: 1. Renal- new start to HD- now running MWF- has OP spot at [facility] MWF second upon D/C.  Access issues- bleeding from AVF- difficult to cannulate.  Now s/p temp cath per IR on 10/18.  VVS on board to do fistulogram and possible TDC if AVF not felt to be usable. Will tentatively plan for next HD tomorrow.  Has actually tolerated dialysis pretty well considering....   2. CAD/afib/CM- cath showing CAD- s/p stenting to LAD per cards  3. Anemia- CKD and ongoing bleeding - ESA and iron being given- drifting down- gave blood 10/23 with HD 4. Secondary hyperparathyroidism- PTH 63- I think was on vit D as OP- phos now 4.5 no binder  5. HTN/volume- BP soft, amiodarone- not on any BP meds   Troy [name]    Labs: Basic Metabolic Panel: Recent Labs  Lab 04/29/18 0424 04/30/18 0343 05/01/18 0340  NA 137 138 136  K 3.8 4.3 3.8  CL 101 102 101  CO2 26 24 27   GLUCOSE 84 86 96  BUN 11 26* 17  CREATININE 2.74* 4.29* 2.98*  CALCIUM 7.7* 7.6* 7.6*  PHOS 2.5 4.5 2.9   Liver Function Tests: Recent Labs  Lab 04/29/18 0424 04/30/18 0343 05/01/18 0340   ALBUMIN 2.7* 2.5* 2.3*   No results for input(s): LIPASE, AMYLASE in the last 168 hours. No results for input(s): AMMONIA in the last 168 hours. CBC: Recent Labs  Lab 04/27/18 1623 04/28/18 0711 04/29/18 0424 04/30/18 0343 04/30/18 1520 05/01/18 0340  WBC 14.8* 12.4* 10.6* 16.8*  --  14.7*  HGB 9.3* 8.7* 8.5* 7.2* 9.3* 8.1*  HCT 29.7* 28.8* 27.4* 23.5* 29.9* 25.9*  MCV 98.3 100.0 98.6 100.4*  --  97.7  PLT 231 224 197 272  --  228   Cardiac Enzymes: No results for input(s): CKTOTAL, CKMB, CKMBINDEX, TROPONINI in the last 168 hours. CBG: Recent Labs  Lab 04/29/18 1905 04/30/18 1235  GLUCAP 102* 76    Iron Studies: No results for input(s): IRON, TIBC, TRANSFERRIN, FERRITIN in the last 72 hours. Studies/Results: No results found. Medications: Infusions: . sodium chloride    . sodium chloride    . ferric gluconate (FERRLECIT/NULECIT) IV 125 mg (04/30/18 1214)    Scheduled Medications: . amiodarone  200 mg Oral Daily  . aspirin EC  81 mg Oral Daily  . atorvastatin  20 mg Oral QHS  . Chlorhexidine Gluconate Cloth  6 each Topical Q0600  . clopidogrel  75 mg Oral Daily  .  Darbepoetin Alfa  200 mcg Intravenous Q Tue-HD  . feeding supplement (NEPRO CARB STEADY)  237 mL Oral BID BM  . feeding supplement (PRO-STAT SUGAR FREE 64)  30 mL Oral Daily  . heparin  5,000 Units Subcutaneous Q8H  . levothyroxine  50 mcg Oral QAC breakfast  . lidocaine-prilocaine   Topical Q M,W,F  . multivitamin  1 tablet Oral QHS  . sodium chloride flush  3 mL Intravenous Q12H  . sodium chloride flush  3 mL Intravenous Q12H    have reviewed scheduled and prn medications.  Physical Exam: General: alert, [place]: brady Lungs: mostly clear Abdomen: soft, non tender Extremities: min edema Dialysis Access: right sided temp cath placed 10/18 -not bleeding- left AVF wrapped but can appreciate bruit    05/01/2018,11:37 AM  LOS: 13 days

## 2018-05-01 NOTE — Progress Notes (Addendum)
PT Cancellation Note  Patient Details Name: Troy [name] MRN: [id number] DOB: 01-16-1941   Cancelled Treatment:    Reason Eval/Treat Not Completed: Patient not medically ready. Pt with right groin pseudodaneuysm being treated with compression; per chart, bed rest for 4-6 hours. Will hold off for PT treatment.  Mabeline Caras, PT, DPT Acute Rehabilitation Services  Pager [phone] Office [place] 05/01/2018, 11:41 AM

## 2018-05-01 NOTE — Progress Notes (Addendum)
Compression of right groin psa successful.  Will delay next dose of sq heparin until 11pm.  Pt tolerated procedure well.  Morphine 2mg  total given.  VSS.  Recheck right groin with duplex in 6 hours or first thing tomorrow morning.  Bed rest for 4-6 hours.   Leontine Locket, [facility] 05/01/2018 11:36 AM

## 2018-05-01 NOTE — Progress Notes (Signed)
PROGRESS NOTE    Troy [name]  ZOX:[id number] DOB: 1941-05-27 DOA: 04/18/2018 PCP: Gaynelle Arabian, MD    Brief Narrative:  77 y.o. 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 Also noted to have progressive renal failure with uremia for which nephrology was consulted and began hemodialysis 10/11 through remotely placed left RC AVF. An outpatient HD chair has been arranged.   Echocardiogram demonstrated worsening of LV dysfunction (EF 45-50% 2017 now 25-30%). Cardiology was consulted, recommending heart catheterization which the patient and family consented to after discussions with palliative care consultants. Left heart catheterization on 10/16 after vitamin K given for supratherapeutic INR showed LAD and RCA disease, though only LAD lesion was felt to be amenable to stenting. On 10/17 he developed erythema proximal to fistula site for which vancomycin and aztreonam were started empirically. Stenting to LAD was scheduled but postponed due to lack of dialysis access (fistula not able to be cannulated). IR placed dialysis catheter in right IJ for HD, though this has had recurrent bleeding issues for which dermabond and suturing were performed 10/20 with stability. Course further complicated by anemia due to bleeding and hematoma formation on the right arm (U/S ruled out clot). LHC planned 10/22 to be followed by fistulogram. Ultimate SNF disposition planned.  Assessment & Plan:   Principal Problem:   Acute on chronic systolic heart failure (HCC) Active Problems:   CKD (chronic kidney disease), stage V (HCC)   ESRD (end stage  renal disease) (HCC)   Leukocytosis   Atypical chest pain   Paroxysmal A-fib (HCC)   NSTEMI (non-ST elevated myocardial infarction) [facility])   Myocardial infarction [facility])   Palliative care by specialist   DNR (do not resuscitate) discussion  NSTEMI: With hx abnormal stress testing Jan 2018 not followed by LHC due to CKD.  - LHC 04/23/2018 showed LAD and RCA lesions, plan PCI to LAD 10/22 (delayed due to fistula issues) with further plan based on clinical course (aggressive medical management vs. staged PCI to RCA). -Continued DAPT, statin, and antihypertensives/antianginals as BP will tolerate. Per Cardiology note, plan would by to continue DAPT x30 days without coumadin, then stop aspirin, continue plavix and restart coumadin -Stable at present.  Uncontrolled bleeding: Difficult situation in patient that requires heparin for confirmed NSTEMI. Qualitative platelet dysfunction likely contributing.  - Continued with platelet as per above as tolerated.  -s/p 1 unit PRBC for hgb down to 7.2 with hgb up to 9.3, repeat 8.1 -repeat cbc in AM. Have discussed with surgery. Recommendation to cont to monitor for now unless pt develops signs of compartment syndrome -Vascular Surgery following. S/p compression tx 10/24  Chronic combined HFrEF: EF worsened to 25-30%.  - Manage volume with HD.  - Otherwise on guideline directed therapy as BP allows - Currently stable   Sinus bradycardia: Does not seem to be symptomatic.  - BB remains on hold  ESRD: CLIP process completed, has spot at [facility] MWF at 12:15pm.  - Dialysis per nephrology last 10/21 - Planned fistulogram per below  AVF difficulty cannulation: Patent on U/S 10/20.  - HD thru right IJ. - Vascular surgery consulted, planning fistulogram  following PCI. -RUE seems more swollen and ecchymotic compared to LUE -Vascular surgery on board. Will repeat CBC, continue to transfuse as tolerated  Anemia of CKD and acute blood loss: s/p 1u PRBCs  10/11, 2u 10/12, 1 units 10/23  in setting of AVF bleeding and supratherapeutic INR, later had IJ cath site bleeding seems to have improved but now with significant hematoma/ecchymosis on right arm possibly related to blood draw. - Recheck CBC in AM - EPO per nephrology  Left arm cellulitis vs. fistula infection: WBC climbed 19 > 30, improving slowly with antibiotics.  - Initial blood cultures negative, repeat cultures neg - Given empiric vancomycin, aztreonam for total of 7 days  Right arm edema, hematoma, extravasation: No SVT or DVT on U/S. Unable to DC blood thinners - Elevate above the level of the heart -RUE soft tissue US ordered and pending - Discussed with general surgery. Recs to monitor and to notify Ortho if signs worrisome for compartment syndrome.   Acute toxic metabolic encephalopathy: Due to uremia. Baseline mental status this AM  Paroxysmal AFib:  - Continue amiodarone. Held BB secondary to bradycardia - Continued on anticoagulation per above per Cardiology - Rate controlled at present  Supratherapeutic INR: Resolved s/p vit K.  PVD, carotid stenosis: s/p CEA 2011.  - Continue ASA, statin as tolerated  Hypothyroidism: Chronic, stable.  - Continue synthroid 65mcg - Currently stable  DVT prophylaxis: Heparin subq Code Status: Full Family Communication: Pt in room, family not at bedside Disposition Plan: Uncertain at this time  Consultants:   Cardiology  Nephrology  Vascular Surgery  Procedures:   [facility] 04/23/2018  Antimicrobials: Anti-infectives (From admission, onward)   Start     Dose/Rate Route Frequency Ordered Stop   05/01/18 1200  vancomycin (VANCOCIN) IVPB 750 mg/150 ml premix  Status:  Discontinued     750 mg 150 mL/hr over 60 Minutes Intravenous Every T-Th-Sa (Hemodialysis) 04/28/18 2115 04/29/18 1248   04/29/18 2200  aztreonam (AZACTAM) 0.5 g in dextrose 5 % 50 mL IVPB     0.5 g 100 mL/hr over 30 Minutes Intravenous Every 12 hours  04/29/18 1248 04/30/18 2223   04/29/18 1200  vancomycin (VANCOCIN) IVPB 750 mg/150 ml premix  Status:  Discontinued     750 mg 150 mL/hr over 60 Minutes Intravenous Every T-Th-Sa (Hemodialysis) 04/27/18 1037 04/28/18 2115   04/28/18 2200  vancomycin (VANCOCIN) IVPB 750 mg/150 ml premix     750 mg 150 mL/hr over 60 Minutes Intravenous  Once 04/28/18 2114 04/29/18 0034   04/28/18 1200  vancomycin (VANCOCIN) IVPB 750 mg/150 ml premix  Status:  Discontinued     750 mg 150 mL/hr over 60 Minutes Intravenous Every M-W-F (Hemodialysis) 04/25/18 1007 04/27/18 1037   04/26/18 1731  Vancomycin (VANCOCIN) 750-5 MG/150ML-% IVPB    Note to Pharmacy:  Alex Gardener   : cabinet override      04/26/18 1731 04/27/18 0544   04/26/18 1200  vancomycin (VANCOCIN) IVPB 750 mg/150 ml premix     750 mg 150 mL/hr over 60 Minutes Intravenous Every Sat (Hemodialysis) 04/26/18 1049 04/26/18 1900   04/25/18 1200  vancomycin (VANCOCIN) IVPB 750 mg/150 ml premix  Status:  Discontinued     750 mg 150 mL/hr over 60 Minutes Intravenous Every M-W-F (Hemodialysis) 04/24/18 0545 04/25/18 1007   04/24/18 0630  aztreonam (AZACTAM) 0.5 g in dextrose 5 % 50 mL IVPB  Status:  Discontinued     0.5 g 100 mL/hr over 30 Minutes Intravenous Every 12  hours 04/24/18 0552 04/29/18 1248   04/24/18 0600  vancomycin (VANCOCIN) 1,250 mg in sodium chloride 0.9 % 250 mL IVPB     1,250 mg 166.7 mL/hr over 90 Minutes Intravenous  Once 04/24/18 0545 04/24/18 0808      Subjective: Reports continued RUE swelling  Objective: Vitals:   04/30/18 2156 04/30/18 2157 05/01/18 0350 05/01/18 0732  BP: (!) 179/28 (!) 149/34 (!) 150/47 (!) 124/34  Pulse:   64 71  Resp:   10 19  Temp:   97.8 F (36.6 C) 98.2 F (36.8 C)  TempSrc:   Oral Oral  SpO2:   98% 97%  Weight:   73.4 kg   Height:       No intake or output data in the 24 hours ending 05/01/18 1513 Filed Weights   04/30/18 0828 04/30/18 1251 05/01/18 0350  Weight: 73.7 kg 73.4 kg  73.4 kg    Examination: General exam: Awake, laying in bed, in nad Respiratory system: Normal respiratory effort, no wheezing Cardiovascular system: regular rate, s1, s2 Gastrointestinal system: Soft, nondistended, positive BS Central nervous system: CN2-12 grossly intact, strength intact Extremities: Perfused, RUE with swelling Skin: multiple areas of ecchymosis Psychiatry: Mood normal // no visual hallucinations   Data Reviewed: I have personally reviewed following labs and imaging studies  CBC: Recent Labs  Lab 04/27/18 1623 04/28/18 0711 04/29/18 0424 04/30/18 0343 04/30/18 1520 05/01/18 0340  WBC 14.8* 12.4* 10.6* 16.8*  --  14.7*  HGB 9.3* 8.7* 8.5* 7.2* 9.3* 8.1*  HCT 29.7* 28.8* 27.4* 23.5* 29.9* 25.9*  MCV 98.3 100.0 98.6 100.4*  --  97.7  PLT 231 224 197 272  --  517   Basic Metabolic Panel: Recent Labs  Lab 04/27/18 0450 04/28/18 0711 04/29/18 0424 04/30/18 0343 05/01/18 0340  NA 136 136 137 138 136  K 3.4* 3.5 3.8 4.3 3.8  CL 99 101 101 102 101  CO2 26 23 26 24 27   GLUCOSE 88 90 84 86 96  BUN 25* 38* 11 26* 17  CREATININE 3.90* 5.44* 2.74* 4.29* 2.98*  CALCIUM 7.3* 7.2* 7.7* 7.6* 7.6*  PHOS 3.9 4.9* 2.5 4.5 2.9   GFR: Estimated Creatinine Clearance: 18.7 mL/min (A) (by C-G formula based on SCr of 2.98 mg/dL (H)). Liver Function Tests: Recent Labs  Lab 04/27/18 0450 04/28/18 0711 04/29/18 0424 04/30/18 0343 05/01/18 0340  ALBUMIN 2.2* 2.3* 2.7* 2.5* 2.3*   No results for input(s): LIPASE, AMYLASE in the last 168 hours. No results for input(s): AMMONIA in the last 168 hours. Coagulation Profile: Recent Labs  Lab 04/25/18 1108  INR 1.51   Cardiac Enzymes: No results for input(s): CKTOTAL, CKMB, CKMBINDEX, TROPONINI in the last 168 hours. BNP (last 3 results) No results for input(s): PROBNP in the last 8760 hours. HbA1C: No results for input(s): HGBA1C in the last 72 hours. CBG: Recent Labs  Lab 04/29/18 1905 04/30/18 1235  GLUCAP  102* 76   Lipid Profile: Recent Labs    04/30/18 0343  CHOL 98  HDL 26*  LDLCALC 41  TRIG 157*  CHOLHDL 3.8   Thyroid Function Tests: No results for input(s): TSH, T4TOTAL, FREET4, T3FREE, THYROIDAB in the last 72 hours. Anemia Panel: No results for input(s): VITAMINB12, FOLATE, FERRITIN, TIBC, IRON, RETICCTPCT in the last 72 hours. Sepsis Labs: No results for input(s): PROCALCITON, LATICACIDVEN in the last 168 hours.  Recent Results (from the past 240 hour(s))  Culture, blood (routine x 2)     Status: None  Collection Time: 04/24/18  6:00 AM  Result Value Ref Range Status   Specimen Description BLOOD RIGHT ANTECUBITAL  Final   Special Requests   Final    BOTTLES DRAWN AEROBIC AND ANAEROBIC Blood Culture adequate volume   Culture   Final    NO GROWTH 5 DAYS Performed at [facility], [address], [place], [place] [address]    Report Status 04/29/2018 FINAL  Final  Culture, blood (routine x 2)     Status: None   Collection Time: 04/24/18  6:15 AM  Result Value Ref Range Status   Specimen Description BLOOD RIGHT HAND  Final   Special Requests   Final    BOTTLES DRAWN AEROBIC AND ANAEROBIC Blood Culture adequate volume   Culture   Final    NO GROWTH 5 DAYS Performed at [facility], [place] [address], [place], [place] [address]    Report Status 04/29/2018 FINAL  Final     Radiology Studies: No results found.  Scheduled Meds: . amiodarone  200 mg Oral Daily  . aspirin EC  81 mg Oral Daily  . atorvastatin  20 mg Oral QHS  . bacitracin-polymyxin b   Both Eyes QID  . Chlorhexidine Gluconate Cloth  6 each Topical Q0600  . Chlorhexidine Gluconate Cloth  6 each Topical Q0600  . clopidogrel  75 mg Oral Daily  . Darbepoetin Alfa  200 mcg Intravenous Q Tue-HD  . feeding supplement (NEPRO CARB STEADY)  237 mL Oral BID BM  . feeding supplement (PRO-STAT SUGAR FREE 64)  30 mL Oral Daily  . heparin  5,000 Units Subcutaneous Q8H  . levothyroxine  50 mcg Oral QAC  breakfast  . lidocaine-prilocaine   Topical Q M,W,F  . multivitamin  1 tablet Oral QHS  . sodium chloride flush  3 mL Intravenous Q12H  . sodium chloride flush  3 mL Intravenous Q12H   Continuous Infusions: . sodium chloride    . sodium chloride    . ferric gluconate (FERRLECIT/NULECIT) IV 125 mg (04/30/18 1214)     LOS: 13 days   Marylu Lund, MD [facility] Pager On Amion  If 7PM-7AM, please contact night-coverage 05/01/2018, 3:13 PM

## 2018-05-01 NOTE — Progress Notes (Addendum)
U/s was successful with closure Still with right arm swelling Due to multiple medical issues, will delay fistulogram until next week   [name]

## 2018-05-01 NOTE — Progress Notes (Addendum)
Progress Note  Patient Name: Troy [name] Date of Encounter: 05/01/2018  Primary Cardiologist: Kirk Ruths, MD   Subjective   No chest pain. Plan for compression for right groin false aneurysm.   Inpatient Medications    Scheduled Meds: . amiodarone  200 mg Oral Daily  . aspirin EC  81 mg Oral Daily  . atorvastatin  20 mg Oral QHS  . Chlorhexidine Gluconate Cloth  6 each Topical Q0600  . clopidogrel  75 mg Oral Daily  . Darbepoetin Alfa  200 mcg Intravenous Q Tue-HD  . feeding supplement (NEPRO CARB STEADY)  237 mL Oral BID BM  . feeding supplement (PRO-STAT SUGAR FREE 64)  30 mL Oral Daily  . heparin  5,000 Units Subcutaneous Q8H  . levothyroxine  50 mcg Oral QAC breakfast  . lidocaine-prilocaine   Topical Q M,W,F  . multivitamin  1 tablet Oral QHS  . sodium chloride flush  3 mL Intravenous Q12H  . sodium chloride flush  3 mL Intravenous Q12H   Continuous Infusions: . sodium chloride    . sodium chloride    . ferric gluconate (FERRLECIT/NULECIT) IV 125 mg (04/30/18 1214)   PRN Meds: sodium chloride, sodium chloride, acetaminophen, heparin, lidocaine, nitroGLYCERIN, ondansetron **OR** ondansetron (ZOFRAN) IV, oxyCODONE-acetaminophen, sodium chloride flush, sodium chloride flush   Vital Signs    Vitals:   04/30/18 2156 04/30/18 2157 05/01/18 0350 05/01/18 0732  BP: (!) 179/28 (!) 149/34 (!) 150/47 (!) 124/34  Pulse:   64 71  Resp:   10 19  Temp:   97.8 F (36.6 C) 98.2 F (36.8 C)  TempSrc:   Oral Oral  SpO2:   98% 97%  Weight:   73.4 kg   Height:        Intake/Output Summary (Last 24 hours) at 05/01/2018 0854 Last data filed at 04/30/2018 1300 Gross per 24 hour  Intake 435 ml  Output 226 ml  Net 209 ml   Filed Weights   04/30/18 0828 04/30/18 1251 05/01/18 0350  Weight: 73.7 kg 73.4 kg 73.4 kg   Telemetry    Sinus rhythm - Personally Reviewed  ECG    N/A  Physical Exam   GEN: No acute distress.   Neck: No JVD Cardiac: RRR, no  murmurs, rubs, or gallops.  Respiratory: Clear to auscultation bilaterally. GI: Soft, nontender, non-distended  MS: No edema; No deformity. Neuro:  Nonfocal  Psych: Normal affect  Extremity: R arm swelling, R groin with mild ecchymosis without bruit and good pedal pluse  Labs    Chemistry Recent Labs  Lab 04/29/18 0424 04/30/18 0343 05/01/18 0340  NA 137 138 136  K 3.8 4.3 3.8  CL 101 102 101  CO2 _0 GLUCOSE 84 86 96  BUN 11 26* 17  CREATININE 2.74* 4.29* 2.98*  CALCIUM 7.7* 7.6* 7.6*  ALBUMIN 2.7* 2.5* 2.3*  GFRNONAA 21* 12* 19*  GFRAA 24* 14* 22*  ANIONGAP _1 Hematology Recent Labs  Lab 04/29/18 0424 04/30/18 0343 04/30/18 1520 05/01/18 0340  WBC 10.6* 16.8*  --  14.7*  RBC 2.78* 2.34*  --  2.65*  HGB 8.5* 7.2* 9.3* 8.1*  HCT 27.4* 23.5* 29.9* 25.9*  MCV 98.6 100.4*  --  97.7  MCH 30.6 30.8  --  30.6  MCHC 31.0 30.6  --  31.3  RDW 18.6* 18.8*  --  19.0*  PLT 197 272  --  228   Radiology    No  results found.  Cardiac Studies   Echo 04/19/2018 LV EF: 25% - 30% Study Conclusions  - Left ventricle: The cavity size was normal. Wall thickness was increased in a pattern of mild LVH. Systolic function was severely reduced. The estimated ejection fraction was in the range of 25% to 30%. Diffuse hypokinesis with regional variation. Doppler parameters are consistent with restrictive diastolic physiology. The E&'e&' ratio is markedly elevated >20, suggesting increased LV filling pressure. - Mitral valve: MAC with leaflet calcification. Moderate regurgitation. - Left atrium: Severely dilated. - Right ventricle: The cavity size was mildly dilated. Moderately reduced systolic function. - Right atrium: The atrium was mildly dilated. - Tricuspid valve: There was mild regurgitation. - Pulmonary arteries: PA peak pressure: 41 mm Hg (S). - Inferior vena cava: The vessel was dilated. The respirophasic diameter changes were  blunted (<50%), consistent with elevated central venous pressure.  Impressions:  - Compared to a prior study in 2017, the LVEF is lower at 25-30% with grade 3 DD and high LV fililng pressure, moderate RV dysfunction and mild to moderate pulmonary hypertension with RVSP of 41 mmHg.   Cath 04/23/2018  Severe 2 Vessel CAD:  Ost RCA to Prox RCA lesion is 95% stenosed. Prox RCA lesion is 80% stenosed. Prox RCA to Mid RCA lesion is 99% stenosed. Mid RCA lesion is 80% stenosed.  Ost LAD lesion is 30% stenosed. Prox LAD lesion is 95% stenosed.  Mid LM to Dist LM lesion is 35% stenosed.  Ost Cx to Prox Cx lesion is 60% stenosed. - very small caliber vessel  LV end diastolic pressure is moderately elevated.  Severe 2 Vessel CAD: 85% calcified pLAD, extensive calcified 95% ostial, 80&90% prox and 80% mid (modereate caliber) RCA with 90% tiny RPAV. 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 (Would be amenable to Impella)  Recommendation: Holding area for sheath removal. Return to 6 E. for ongoing care. With severely calcified disease in the LAD and RCA that would likely require atherectomy in her low EF, would potentially benefit from Impella support -->with the patient being DNR and relatively poor status to begin with, would need to have conversation with other interventionalists and attending cardiologist along with family to determine the best course of action going forward. -->Perhaps medical management with palliative care would be an option. I do think that attempted PCI would be best served with Impella support if possible.  I will restart IV heparin 8 hours after sheath removal. Will  discuss with interventional cardiology colleagues to review images and determine potential interventional options and will communicate with Dr. Stanford Breed.  PCI: 04/29/2018  Mid LM to Dist LM lesion is 35% stenosed.  Ost LAD lesion is 30% stenosed.  Prox LAD lesion is 95% stenosed.  A drug-eluting stent was successfully placed using a STENT SYNERGY DES 3X32.  Post intervention, there is a 0% residual stenosis.  Ost Cx to Prox Cx lesion is 60% stenosed.  Ost RCA to Prox RCA lesion is 95% stenosed.  Prox RCA lesion is 80% stenosed.  Prox RCA to Mid RCA lesion is 99% stenosed.  Mid RCA lesion is 80% stenosed.  LV end diastolic pressure is normal. LVEDP 12 mm Hg.  There is no aortic valve stenosis. Recommend uninterrupted dual antiplatelet therapy with Aspirin 73m  daily and Clopidogrel 42m daily for a minimum of 12 months (ACS - Class I recommendation).  Patient was preloaded with Plavix and this was continued. Collaterals to the RCA already developing. Would treat medically for now. RCA PCI would be higher risk given the long, calcified, diffuse disease.   Patient Profile     77y.o. male with a hx of stage 5 CKD, abnormal stress test in 07/2016 (pt declined cath due to concerns regarding worsening renal function and need for HD), PVD with carotid artery occlusion s/p left CEA in 2011, atrial fibrillation, chronic anticoagulation with coumadin, chronic combined systolic and diastolic HF moderate MR, HTN, HLD and h/o stroke presented with chest pain and ruled in for NSTEMI.  Echocardiogram shows newly reduced LV function with ejection fraction 25 to 30%. There is moderate mitral regurgitation. Cath 10/16 showed significant RCA and LAD disease, plan for staged PCI. Hospitalization complicated by requiring HD, bleeding from L arm fistula site. Now right groin false aneurysm status post right groin access for coronary intervention on 11/22.   Assessment & Plan    1. NSTEMI -  s/p DES LAD and medical therapy for residual dz RCA at 95% - No recurrent chest pain - LDL is at goal, HDL low - ASA & Plavix for 30 days, then Plavix only with restart Coumadin.  2. Ischemic cardiomyopathy/ Chronic systolic CHF  - EF down to 25% by echo from previous 45% - No BB due to BP problems and hx bradycardia. No ACE/ARB due to ESRD - Volume managed through dialysis  3. Acute on chronic renal disease/ESRD - Now on HD per Nephrology - Dr BTrula Slade L fistula functions poorly but still has thrill - fistulogram and further per [facility]  4. PAF on amiodarone.  - continue Amio - w/ age/frailty, will not do triple drug therapy - start coumadin once pt has completed a month of ASA and is on Plavix alone  5. Right groin false aneurysm post cath - Plan for compression today per vascular   6. Anemia  - S/p multiple transfusion this admission. Per primary   7. R arm swelling - Per primary team   For questions or updates, please contact C[place]Please consult [email or web address] for contact info under     Signed, BLeanor Kail PA  05/01/2018, 8:54 AM    The patient was seen, examined and discussed with Bhagat,Bhavinkumar PA-C and I agree with the above.   77y.o. male with a hx of stage 5 CKD, PVD with carotid artery occlusion s/p left CEA in 2011, atrial fibrillation, chronic anticoagulation with coumadin, chronic combined systolic and diastolic HF (LVEF 251-76%, moderate MR, HTN, HLD and h/o stroke, admitted with NSTEMI, s/p DES LAD and medical therapy for residual dz RCA at 95%. No recurrent chest pain, but right groin pain - cath insertion site (no bruit and good peripheral pulses, mild hematoma), vascular Duplex showed a false aneurysm. Dr Early consulted and requested treatment with compression today. He also discussed with him that potential need for thrombin injection and even potential for surgery would be unlikely. The patient also developed significant RUE swelling  and hematoma - painful and warm. He is currently on ASA & Plavix for 30 days, then Plavix only with restart Coumadin. He remains in SR. He received 1 unit of PRBC the last nigh with improvement of Hb 7.2->9.3 but then 8.1 this am. I think that surgery should be consulted for consideration of a possible hematoma drainage. We will follow.  Ena Dawley, MD 05/01/2018

## 2018-05-02 ENCOUNTER — Inpatient Hospital Stay (HOSPITAL_COMMUNITY): Payer: Medicare Other

## 2018-05-02 ENCOUNTER — Inpatient Hospital Stay (HOSPITAL_COMMUNITY): Admission: RE | Admit: 2018-05-02 | Payer: Medicare Other | Source: Ambulatory Visit

## 2018-05-02 DIAGNOSIS — I724 Aneurysm of artery of lower extremity: Secondary | ICD-10-CM

## 2018-05-02 DIAGNOSIS — Z515 Encounter for palliative care: Secondary | ICD-10-CM

## 2018-05-02 LAB — RENAL FUNCTION PANEL
ANION GAP: 10 (ref 5–15)
Albumin: 2.4 g/dL — ABNORMAL LOW (ref 3.5–5.0)
BUN: 38 mg/dL — AB (ref 8–23)
CHLORIDE: 102 mmol/L (ref 98–111)
CO2: 26 mmol/L (ref 22–32)
Calcium: 7.6 mg/dL — ABNORMAL LOW (ref 8.9–10.3)
Creatinine, Ser: 4.95 mg/dL — ABNORMAL HIGH (ref 0.61–1.24)
GFR calc Af Amer: 12 mL/min — ABNORMAL LOW (ref >60)
GFR, EST NON AFRICAN AMERICAN: 10 mL/min — AB (ref >60)
GLUCOSE: 117 mg/dL — AB (ref 70–99)
PHOSPHORUS: 4.4 mg/dL (ref 2.5–4.6)
POTASSIUM: 3.4 mmol/L — AB (ref 3.5–5.1)
Sodium: 138 mmol/L (ref 135–145)

## 2018-05-02 LAB — CBC
HCT: 25.1 % — ABNORMAL LOW (ref 39.0–52.0)
Hemoglobin: 7.5 g/dL — ABNORMAL LOW (ref 13.0–17.0)
MCH: 29.8 pg (ref 26.0–34.0)
MCHC: 29.9 g/dL — AB (ref 30.0–36.0)
MCV: 99.6 fL (ref 80.0–100.0)
PLATELETS: 246 10*3/uL (ref 150–400)
RBC: 2.52 MIL/uL — AB (ref 4.22–5.81)
RDW: 18.9 % — ABNORMAL HIGH (ref 11.5–15.5)
WBC: 16.7 10*3/uL — ABNORMAL HIGH (ref 4.0–10.5)
nRBC: 0 % (ref 0.0–0.2)

## 2018-05-02 LAB — PREPARE RBC (CROSSMATCH)

## 2018-05-02 MED ORDER — DARBEPOETIN ALFA 200 MCG/0.4ML IJ SOSY
PREFILLED_SYRINGE | INTRAMUSCULAR | Status: AC
  Administered 2018-05-02: 200 ug
  Filled 2018-05-02: qty 0.4

## 2018-05-02 MED ORDER — SODIUM CHLORIDE 0.9% IV SOLUTION: Freq: Once | INTRAVENOUS | Status: AC

## 2018-05-02 MED ORDER — HEPARIN SODIUM (PORCINE) 1000 UNIT/ML IJ SOLN
INTRAMUSCULAR | Status: AC
  Filled 2018-05-02: qty 3

## 2018-05-02 MED ORDER — DARBEPOETIN ALFA 60 MCG/0.3ML IJ SOSY: 200.0000 ug | PREFILLED_SYRINGE | INTRAMUSCULAR | Status: DC

## 2018-05-02 NOTE — Progress Notes (Signed)
PROGRESS NOTE    Troy [name]  EGB:[id number] DOB: [birth year] DOA: 04/18/2018 PCP: Gaynelle Arabian, MD    Brief Narrative:  77 y.o. 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 Also noted to have progressive renal failure with uremia for which nephrology was consulted and began hemodialysis 10/11 through remotely placed left RC AVF. An outpatient HD chair has been arranged.   Echocardiogram demonstrated worsening of LV dysfunction (EF 45-50% 2017 now 25-30%). Cardiology was consulted, recommending heart catheterization which the patient and family consented to after discussions with palliative care consultants. Left heart catheterization on 10/16 after vitamin K given for supratherapeutic INR showed LAD and RCA disease, though only LAD lesion was felt to be amenable to stenting. On 10/17 he developed erythema proximal to fistula site for which vancomycin and aztreonam were started empirically. Stenting to LAD was scheduled but postponed due to lack of dialysis access (fistula not able to be cannulated). IR placed dialysis catheter in right IJ for HD, though this has had recurrent bleeding issues for which dermabond and suturing were performed 10/20 with stability. Course further complicated by anemia due to bleeding and hematoma formation on the right arm (U/S ruled out clot). LHC planned 10/22 to be followed by fistulogram. Ultimate SNF disposition planned.  Assessment & Plan:   Principal Problem:   Acute on chronic systolic heart failure (HCC) Active Problems:   CKD (chronic kidney disease), stage V (HCC)   ESRD (end stage  renal disease) (HCC)   Leukocytosis   Atypical chest pain   Paroxysmal A-fib (HCC)   NSTEMI (non-ST elevated myocardial infarction) [facility])   Myocardial infarction [facility])   Palliative care by specialist   DNR (do not resuscitate) discussion  NSTEMI: With hx abnormal stress testing Jan 2018 not followed by LHC due to CKD.  - LHC 04/23/2018 showed LAD and RCA lesions, plan PCI to LAD 10/22 (delayed due to fistula issues) with further plan based on clinical course (aggressive medical management vs. staged PCI to RCA). -Continued DAPT, statin, and antihypertensives/antianginals as BP will tolerate. Per Cardiology note, plan would by to continue DAPT x30 days without coumadin, then stop aspirin, continue plavix and restart coumadin -remains stable currently  Uncontrolled bleeding: Difficult situation in patient that requires heparin for confirmed NSTEMI. Qualitative platelet dysfunction likely contributing.  - Continued with platelet as per above as tolerated.  -s/p 1 unit PRBC for hgb down to 7.2 with hgb up to 9.3, repeat 8.1 -Hgb 7.5 with one more unit PRBC ordered. -repeat cbc in AM.  -Ordered and reviewed soft tissue US of RUE. Findings of small amount of fluid and subcutaneous edema -Had earlier discussed case with General Surgery who does not recommend surgical drainage -If pt develops signs worrisome for compartment syndrome, would discuss with Orthopedic Surgery at that time -Vascular Surgery following. S/p compression tx 10/24  Chronic combined HFrEF: EF worsened to 25-30%.  - Manage volume with HD.  - Otherwise on guideline directed therapy as BP allows - Tolerating HD  Sinus bradycardia: Does not seem to be symptomatic.  - BB remains on hold  ESRD: CLIP process completed, has spot at  [facility] MWF at 12:15pm.  - Dialysis per nephrology last 10/21 - Planned fistulogram for next week  AVF difficulty cannulation: Patent on U/S 10/20.  - HD thru right IJ. - Vascular surgery  consulted, planning fistulogram next week -RUE seems more swollen and ecchymotic compared to LUE -Vascular surgery on board. Will recheck CBC, continue to transfuse as tolerated  Anemia of CKD and acute blood loss: s/p 1u PRBCs 10/11, 2u 10/12, 1 units 10/23  in setting of AVF bleeding and supratherapeutic INR, later had IJ cath site bleeding seems to have improved but now with significant hematoma/ecchymosis on right arm possibly related to blood draw. - Repeat CBC in AM - EPO per nephrology  Left arm cellulitis vs. fistula infection: WBC climbed 19 > 30, improving slowly with antibiotics.  - Initial blood cultures negative, repeat cultures neg - Given empiric vancomycin, aztreonam for total of 7 days  Right arm edema, hematoma, extravasation: No SVT or DVT on U/S. Unable to DC blood thinners - Elevate above the level of the heart -RUE soft tissue US ordered and reviewed. Small fluid collection noted -Earlier discussed with General Surgery who did not recommend drainage of lesion.  -will stop subq heparin and transition to SCD's for DVT prophylaxis  Acute toxic metabolic encephalopathy: Due to uremia.  -Mentation seems to be at baseline this AM  Paroxysmal AFib:  - Continue amiodarone. Held BB secondary to bradycardia - Continued on anticoagulation per above per Cardiology - Remains rate controlled  Supratherapeutic INR: Resolved s/p vit K.  PVD, carotid stenosis: s/p CEA 2011.  - Continue ASA, statin as tolerated  Hypothyroidism: Chronic, stable.  - Continue synthroid 65mcg - Presently stable  DVT prophylaxis: Heparin subq Code Status: Full Family Communication: Pt in room, family not at bedside Disposition Plan: Uncertain at this time  Consultants:   Cardiology  Nephrology  Vascular Surgery  Procedures:   [facility] 04/23/2018  Antimicrobials: Anti-infectives (From admission, onward)   Start     Dose/Rate Route Frequency Ordered Stop   05/01/18 1200   vancomycin (VANCOCIN) IVPB 750 mg/150 ml premix  Status:  Discontinued     750 mg 150 mL/hr over 60 Minutes Intravenous Every T-Th-Sa (Hemodialysis) 04/28/18 2115 04/29/18 1248   04/29/18 2200  aztreonam (AZACTAM) 0.5 g in dextrose 5 % 50 mL IVPB     0.5 g 100 mL/hr over 30 Minutes Intravenous Every 12 hours 04/29/18 1248 04/30/18 2223   04/29/18 1200  vancomycin (VANCOCIN) IVPB 750 mg/150 ml premix  Status:  Discontinued     750 mg 150 mL/hr over 60 Minutes Intravenous Every T-Th-Sa (Hemodialysis) 04/27/18 1037 04/28/18 2115   04/28/18 2200  vancomycin (VANCOCIN) IVPB 750 mg/150 ml premix     750 mg 150 mL/hr over 60 Minutes Intravenous  Once 04/28/18 2114 04/29/18 0034   04/28/18 1200  vancomycin (VANCOCIN) IVPB 750 mg/150 ml premix  Status:  Discontinued     750 mg 150 mL/hr over 60 Minutes Intravenous Every M-W-F (Hemodialysis) 04/25/18 1007 04/27/18 1037   04/26/18 1731  Vancomycin (VANCOCIN) 750-5 MG/150ML-% IVPB    Note to Pharmacy:  Alex Gardener   : cabinet override      04/26/18 1731 04/27/18 0544   04/26/18 1200  vancomycin (VANCOCIN) IVPB 750 mg/150 ml premix     750 mg 150 mL/hr over 60 Minutes Intravenous Every Sat (Hemodialysis) 04/26/18 1049 04/26/18 1900   04/25/18 1200  vancomycin (VANCOCIN) IVPB 750 mg/150 ml premix  Status:  Discontinued  750 mg 150 mL/hr over 60 Minutes Intravenous Every M-W-F (Hemodialysis) 04/24/18 0545 04/25/18 1007   04/24/18 0630  aztreonam (AZACTAM) 0.5 g in dextrose 5 % 50 mL IVPB  Status:  Discontinued     0.5 g 100 mL/hr over 30 Minutes Intravenous Every 12 hours 04/24/18 0552 04/29/18 1248   04/24/18 0600  vancomycin (VANCOCIN) 1,250 mg in sodium chloride 0.9 % 250 mL IVPB     1,250 mg 166.7 mL/hr over 90 Minutes Intravenous  Once 04/24/18 0545 04/24/18 0808      Subjective: Still complaining of RUE swelling and discomfort  Objective: Vitals:   05/02/18 1130 05/02/18 1200 05/02/18 1216 05/02/18 1500  BP: (!) 90/25 (!) 108/28  (!) 90/20 (!) 76/26  Pulse: (!) 58 61 63 63  Resp:   18 15  Temp:   98 F (36.7 C) 98.3 F (36.8 C)  TempSrc:   Oral Oral  SpO2:    98%  Weight:   73.5 kg   Height:        Intake/Output Summary (Last 24 hours) at 05/02/2018 1705 Last data filed at 05/02/2018 1216 Gross per 24 hour  Intake 630 ml  Output 592 ml  Net 38 ml   Filed Weights   05/02/18 0438 05/02/18 0807 05/02/18 1216  Weight: 74.9 kg 73.8 kg 73.5 kg    Examination: General exam: Conversant, in no acute distress Respiratory system: normal chest rise, clear, no audible wheezing Cardiovascular system: regular rhythm, s1-s2 Gastrointestinal system: Nondistended, nontender, pos BS Central nervous system: No seizures, no tremors Extremities: RUE swelling tenderness Skin: No rashes, no pallor, multiple areas of ecchymosis Psychiatry: Affect normal // no auditory hallucinations   Data Reviewed: I have personally reviewed following labs and imaging studies  CBC: Recent Labs  Lab 04/28/18 0711 04/29/18 0424 04/30/18 0343 04/30/18 1520 05/01/18 0340 05/02/18 0339  WBC 12.4* 10.6* 16.8*  --  14.7* 16.7*  HGB 8.7* 8.5* 7.2* 9.3* 8.1* 7.5*  HCT 28.8* 27.4* 23.5* 29.9* 25.9* 25.1*  MCV 100.0 98.6 100.4*  --  97.7 99.6  PLT 224 197 272  --  228 505   Basic Metabolic Panel: Recent Labs  Lab 04/28/18 0711 04/29/18 0424 04/30/18 0343 05/01/18 0340 05/02/18 0836  NA 136 137 138 136 138  K 3.5 3.8 4.3 3.8 3.4*  CL 101 101 102 101 102  CO2 23 26 24 27 26   GLUCOSE 90 84 86 96 117*  BUN 38* 11 26* 17 38*  CREATININE 5.44* 2.74* 4.29* 2.98* 4.95*  CALCIUM 7.2* 7.7* 7.6* 7.6* 7.6*  PHOS 4.9* 2.5 4.5 2.9 4.4   GFR: Estimated Creatinine Clearance: 11.3 mL/min (A) (by C-G formula based on SCr of 4.95 mg/dL (H)). Liver Function Tests: Recent Labs  Lab 04/28/18 0711 04/29/18 0424 04/30/18 0343 05/01/18 0340 05/02/18 0836  ALBUMIN 2.3* 2.7* 2.5* 2.3* 2.4*   No results for input(s): LIPASE, AMYLASE in the  last 168 hours. No results for input(s): AMMONIA in the last 168 hours. Coagulation Profile: No results for input(s): INR, PROTIME in the last 168 hours. Cardiac Enzymes: No results for input(s): CKTOTAL, CKMB, CKMBINDEX, TROPONINI in the last 168 hours. BNP (last 3 results) No results for input(s): PROBNP in the last 8760 hours. HbA1C: No results for input(s): HGBA1C in the last 72 hours. CBG: Recent Labs  Lab 04/29/18 1905 04/30/18 1235  GLUCAP 102* 76   Lipid Profile: Recent Labs    04/30/18 0343  CHOL 98  HDL 26*  LDLCALC 41  TRIG 157*  CHOLHDL 3.8   Thyroid Function Tests: No results for input(s): TSH, T4TOTAL, FREET4, T3FREE, THYROIDAB in the last 72 hours. Anemia Panel: No results for input(s): VITAMINB12, FOLATE, FERRITIN, TIBC, IRON, RETICCTPCT in the last 72 hours. Sepsis Labs: No results for input(s): PROCALCITON, LATICACIDVEN in the last 168 hours.  Recent Results (from the past 240 hour(s))  Culture, blood (routine x 2)     Status: None   Collection Time: 04/24/18  6:00 AM  Result Value Ref Range Status   Specimen Description BLOOD RIGHT ANTECUBITAL  Final   Special Requests   Final    BOTTLES DRAWN AEROBIC AND ANAEROBIC Blood Culture adequate volume   Culture   Final    NO GROWTH 5 DAYS Performed at [facility], [address] [place], [place] [address]    Report Status 04/29/2018 FINAL  Final  Culture, blood (routine x 2)     Status: None   Collection Time: 04/24/18  6:15 AM  Result Value Ref Range Status   Specimen Description BLOOD RIGHT HAND  Final   Special Requests   Final    BOTTLES DRAWN AEROBIC AND ANAEROBIC Blood Culture adequate volume   Culture   Final    NO GROWTH 5 DAYS Performed at [facility], [place] [address], [place], [place] [address]    Report Status 04/29/2018 FINAL  Final     Radiology Studies: [place] Rt Upper Extrem Ltd Soft Tissue Non Vascular  Result Date: 05/02/2018 CLINICAL DATA:  77 year old with  possible hematoma in the right upper extremity. EXAM: ULTRASOUND RIGHT UPPER EXTREMITY LIMITED TECHNIQUE: Ultrasound examination of the upper extremity soft tissues was performed in the area of clinical concern. COMPARISON:  None FINDINGS: Subcutaneous edema at the area of concern. Images are labeled as right upper forearm. A small amount of fluid around vascular structures but this is not a dedicated vascular exam and not clear which vascular structures being imaged. IMPRESSION: Small amount of fluid and subcutaneous edema at the area of concern. Fluid is near vascular structures but not clear which vascular structures are being imaged. Electronically Signed   By: Markus Daft M.D.   On: 05/02/2018 11:38    Scheduled Meds: . sodium chloride   Intravenous Once  . amiodarone  200 mg Oral Daily  . aspirin EC  81 mg Oral Daily  . atorvastatin  20 mg Oral QHS  . bacitracin-polymyxin b   Both Eyes QID  . Chlorhexidine Gluconate Cloth  6 each Topical Q0600  . clopidogrel  75 mg Oral Daily  . Darbepoetin Alfa  200 mcg Intravenous Q Fri-HD  . feeding supplement (NEPRO CARB STEADY)  237 mL Oral BID BM  . feeding supplement (PRO-STAT SUGAR FREE 64)  30 mL Oral Daily  . heparin  5,000 Units Subcutaneous Q8H  . levothyroxine  50 mcg Oral QAC breakfast  . lidocaine-prilocaine   Topical Q M,W,F  . multivitamin  1 tablet Oral QHS  . sodium chloride flush  3 mL Intravenous Q12H   Continuous Infusions: . sodium chloride       LOS: 14 days   Marylu Lund, MD [facility] Pager On Amion  If 7PM-7AM, please contact night-coverage 05/02/2018, 5:05 PM

## 2018-05-02 NOTE — Progress Notes (Addendum)
Progress Note  Patient Name: Troy [name] Date of Encounter: 05/02/2018  Primary Cardiologist: Kirk Ruths, MD   Subjective   Seen in HD. No chest pain or dyspnea. Sleepy. Plan to transfuse 1 Unit PRBCs during HD.   Inpatient Medications    Scheduled Meds: . sodium chloride   Intravenous Once  . amiodarone  200 mg Oral Daily  . aspirin EC  81 mg Oral Daily  . atorvastatin  20 mg Oral QHS  . bacitracin-polymyxin b   Both Eyes QID  . Chlorhexidine Gluconate Cloth  6 each Topical Q0600  . clopidogrel  75 mg Oral Daily  . Darbepoetin Alfa  200 mcg Intravenous Q Tue-HD  . feeding supplement (NEPRO CARB STEADY)  237 mL Oral BID BM  . feeding supplement (PRO-STAT SUGAR FREE 64)  30 mL Oral Daily  . heparin  5,000 Units Subcutaneous Q8H  . levothyroxine  50 mcg Oral QAC breakfast  . lidocaine-prilocaine   Topical Q M,W,F  . multivitamin  1 tablet Oral QHS  . sodium chloride flush  3 mL Intravenous Q12H   Continuous Infusions: . sodium chloride    . ferric gluconate (FERRLECIT/NULECIT) IV 125 mg (04/30/18 1214)   PRN Meds: sodium chloride, acetaminophen, heparin, lidocaine, morphine injection, nitroGLYCERIN, ondansetron **OR** ondansetron (ZOFRAN) IV, oxyCODONE-acetaminophen, sodium chloride flush   Vital Signs    Vitals:   05/02/18 0830 05/02/18 0839 05/02/18 0845 05/02/18 0900  BP: (!) 75/21 (!) 85/22 (!) 82/18 (!) 88/19  Pulse: 61 60 60 (!) 58  Resp:   13   Temp:   98.9 F (37.2 C)   TempSrc:   Oral   SpO2:      Weight:      Height:        Intake/Output Summary (Last 24 hours) at 05/02/2018 0915 Last data filed at 05/02/2018 0900 Gross per 24 hour  Intake 315 ml  Output -  Net 315 ml   Filed Weights   05/01/18 0350 05/02/18 0438 05/02/18 0807  Weight: 73.4 kg 74.9 kg 73.8 kg    Telemetry    Sr - Personally Reviewed  ECG    N/A  Physical Exam   GEN: No acute distress.   Neck: No JVD Cardiac: RRR, no murmurs, rubs, or gallops.  Respiratory:  Clear to auscultation bilaterally. GI: Soft, nontender, non-distended  MS: No edema; No deformity. Neuro:  Nonfocal  Psych: Normal affect  Extremity: Bilateral arm swelling, R groin with mild ecchymosis without bruit and good pedal pluse  Labs    Chemistry Recent Labs  Lab 04/29/18 0424 04/30/18 0343 05/01/18 0340  NA 137 138 136  K 3.8 4.3 3.8  CL 101 102 101  CO2 _0 GLUCOSE 84 86 96  BUN 11 26* 17  CREATININE 2.74* 4.29* 2.98*  CALCIUM 7.7* 7.6* 7.6*  ALBUMIN 2.7* 2.5* 2.3*  GFRNONAA 21* 12* 19*  GFRAA 24* 14* 22*  ANIONGAP _1 Hematology Recent Labs  Lab 04/30/18 0343 04/30/18 1520 05/01/18 0340 05/02/18 0339  WBC 16.8*  --  14.7* 16.7*  RBC 2.34*  --  2.65* 2.52*  HGB 7.2* 9.3* 8.1* 7.5*  HCT 23.5* 29.9* 25.9* 25.1*  MCV 100.4*  --  97.7 99.6  MCH 30.8  --  30.6 29.8  MCHC 30.6  --  31.3 29.9*  RDW 18.8*  --  19.0* 18.9*  PLT 272  --  228 246    Radiology  No results found.  Cardiac Studies   Echo 04/19/2018 LV EF: 25% - 30% Study Conclusions  - Left ventricle: The cavity size was normal. Wall thickness was increased in a pattern of mild LVH. Systolic function was severely reduced. The estimated ejection fraction was in the range of 25% to 30%. Diffuse hypokinesis with regional variation. Doppler parameters are consistent with restrictive diastolic physiology. The E&'e&' ratio is markedly elevated >20, suggesting increased LV filling pressure. - Mitral valve: MAC with leaflet calcification. Moderate regurgitation. - Left atrium: Severely dilated. - Right ventricle: The cavity size was mildly dilated. Moderately reduced systolic function. - Right atrium: The atrium was mildly dilated. - Tricuspid valve: There was mild regurgitation. - Pulmonary arteries: PA peak pressure: 41 mm Hg (S). - Inferior vena cava: The vessel was dilated. The respirophasic diameter changes were blunted (<50%), consistent with  elevated central venous pressure.  Impressions:  - Compared to a prior study in 2017, the LVEF is lower at 25-30% with grade 3 DD and high LV fililng pressure, moderate RV dysfunction and mild to moderate pulmonary hypertension with RVSP of 41 mmHg.   Cath 04/23/2018  Severe 2 Vessel CAD:  Ost RCA to Prox RCA lesion is 95% stenosed. Prox RCA lesion is 80% stenosed. Prox RCA to Mid RCA lesion is 99% stenosed. Mid RCA lesion is 80% stenosed.  Ost LAD lesion is 30% stenosed. Prox LAD lesion is 95% stenosed.  Mid LM to Dist LM lesion is 35% stenosed.  Ost Cx to Prox Cx lesion is 60% stenosed. - very small caliber vessel  LV end diastolic pressure is moderately elevated.  Severe 2 Vessel CAD: 85% calcified pLAD, extensive calcified 95% ostial, 80&90% prox and 80% mid (modereate caliber) RCA with 90% tiny RPAV. 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 (Would be amenable to Impella)  Recommendation: Holding area for sheath removal. Return to 6 E. for ongoing care. With severely calcified disease in the LAD and RCA that would likely require atherectomy in her low EF, would potentially benefit from Impella support -->with the patient being DNR and relatively poor status to begin with, would need to have conversation with other interventionalists and attending cardiologist along with family to determine the best course of action going forward. -->Perhaps medical management with palliative care would be an option. I do think that attempted PCI would be best served with Impella support if possible.  I will restart IV heparin 8 hours after sheath removal. Will discuss with interventional  cardiology colleagues to review images and determine potential interventional options and will communicate with Dr. Stanford Breed.  PCI: 04/29/2018  Mid LM to Dist LM lesion is 35% stenosed.  Ost LAD lesion is 30% stenosed.  Prox LAD lesion is 95% stenosed.  A drug-eluting stent was successfully placed using a STENT SYNERGY DES 3X32.  Post intervention, there is a 0% residual stenosis.  Ost Cx to Prox Cx lesion is 60% stenosed.  Ost RCA to Prox RCA lesion is 95% stenosed.  Prox RCA lesion is 80% stenosed.  Prox RCA to Mid RCA lesion is 99% stenosed.  Mid RCA lesion is 80% stenosed.  LV end diastolic pressure is normal. LVEDP 12 mm Hg.  There is no aortic valve stenosis. Recommend uninterrupted dual antiplatelet therapy with Aspirin  78m daily and Clopidogrel 733mdaily for a minimum of 12 months (ACS - Class I recommendation).  Patient was preloaded with Plavix and this was continued. Collaterals to the RCA already developing. Would treat medically for now. RCA PCI would be higher risk given the long, calcified, diffuse disease.  Patient Profile     [age].o. male with a hx of stage 5 CKD, abnormal stress test in 07/2016 (pt declined cath due to concerns regarding worsening renal function and need for HD), PVD with carotid artery occlusion s/p left CEA in 2011, atrial fibrillation, chronic anticoagulation with coumadin, chronic combined systolic and diastolic HF moderate MR, HTN, HLD and h/o strokepresented with chest pain and ruled in for NSTEMI.  Echocardiogram shows newly reduced LV function with ejection fraction 25 to 30%. There is moderate mitral regurgitation. Cath 10/16 showed significant RCA and LAD disease, plan for staged PCI. Hospitalization complicated by requiring HD, bleeding from L arm fistula site. Now right groin false aneurysm status post right groin access for coronary intervention on 11/22.   Assessment & Plan    1. NSTEMI - s/p DES LAD and medical  therapy for residual dz RCA at 95% - No recurrent chest pain - LDL is at goal, HDL low - ASA & Plavix for 30 days, then Plavix only with restart Coumadin.  2. Ischemic cardiomyopathy/ Chronic systolic CHF  - EF down to 25% by echo from previous 45% - No BB due to BP problems and hx bradycardia. No ACE/ARB due to ESRD - Volume managed through dialysis  3. Acute on chronic renal disease/ESRD - Now on HD per Nephrology - Dr BrTrula SladeL fistula functions poorly but still has thrill - fistulogram and further per [facility]  4. PAF on amiodarone. - continue Amio - w/ age/frailty, will not do triple drug therapy - start coumadin once pt has completed a month of ASA and is on Plavix alone  5. Right groin false aneurysm - Resolved with compression yesterday. Plan to repeat study today   6. Anemia  - S/p multiple transfusion this admission. hgb dropped again to 7.5. Plan to transfuse another 1 unit of PRBCs today   7. R arm swelling - Pending [place]    For questions or updates, please contact [facility] consult [email or web address] for contact info under     Signed, BhLeanor KailPA  05/02/2018, 9:15 AM    The patient was seen, examined and discussed with Bhagat,Bhavinkumar PA-C and I agree with the above.   [age].o. male with a hx of stage 5 CKD, PVD with carotid artery occlusion s/p left CEA in 2011, atrial fibrillation, chronic anticoagulation with coumadin, chronic combined systolic and diastolic HF (LVEF 2567-12% moderate MR, HTN, HLD and h/o stroke, admitted with NSTEMI, s/p DES LAD and medical therapy for residual dz RCA at 95%. No recurrent chest pain, but right groin pain - cath insertion site (no bruit and good peripheral pulses, mild hematoma), vascular Duplex showed a false aneurysm. Dr Early consulted. The patient underwent a successful treatment with compression yesterday. He has no bruit or pain this am. Also good peripheral pulses.  The patient also developed  significant RUE swelling and hematoma - painful and warm, vascular [place] pending . He is currently on ASA & Plavix for 30 days, then Plavix only with restart Coumadin. He remains in SR. He received 1 unit of PRBC on 10/23, also receiving one at the HD this morning. Improvement of Hb 7.2->9.3 but then 7.5,  repeat this pm. I think that surgery should be consulted for consideration of a possible hematoma drainage. We will follow. He is currently chest pain free, BP low in HD with SBP 88 mmHg.   Ena Dawley, MD 05/02/2018

## 2018-05-02 NOTE — Procedures (Signed)
Patient was seen on dialysis and the procedure was supervised.  BFR 400  Via temp cath BP is  112/24.   Patient appears to be tolerating treatment well  [name] 05/02/2018

## 2018-05-02 NOTE — Progress Notes (Signed)
*  Preliminary Results*  Limited right lower extremity arterial duplex completed. There is no obvious evidence of right common femoral artery pseudoaneurysm.  05/02/2018 3:28 PM Maudry Mayhew, MHA, RVT, RDCS, RDMS

## 2018-05-02 NOTE — Progress Notes (Signed)
Subjective:  Seen on HD- some low BPs - good spirits  Objective Vital signs in last 24 hours: Vitals:   05/02/18 0900 05/02/18 0915 05/02/18 0930 05/02/18 0943  BP: (!) 88/19 (!) 93/23 (!) 96/19 (!) 130/32  Pulse: (!) 58 (!) 58 (!) 59 64  Resp: 14 14 15 16   Temp:  98.1 F (36.7 C)  98 F (36.7 C)  TempSrc:  Oral  Oral  SpO2:      Weight:      Height:       Weight change: 1.2 kg  Intake/Output Summary (Last 24 hours) at 05/02/2018 0951 Last data filed at 05/02/2018 9678 Gross per 24 hour  Intake 630 ml  Output -  Net 630 ml    Assessment/ Plan: Pt is a 77 y.o. yo male who was admitted on 04/18/2018 with new start to HD- hosp complicated by worsening cardiac status/Afib, cath showing CAD needs intervention- also poorly functioning AVF  Assessment/Plan: 1. Renal- new start to HD- now running MWF- has OP spot at [facility] MWF second upon D/C.  Access issues- bleeding from AVF- difficult to cannulate.  Now s/p temp cath per IR on 10/18.  VVS on board to do fistulogram and possible TDC if AVF not felt to be usable. VVS has put off fgram til next week the only issue is that he has a temp cath in place now 7 days, will be OK if can do early next week before temp cath NEEDS to be removed. Will tentatively plan for next HD Monday.  Has actually tolerated dialysis pretty well considering....   2. CAD/afib/CM- cath showing CAD- s/p stenting to LAD per cards  3. Anemia- CKD and ongoing bleeding - ESA and iron being given- drifting down- gave blood 10/23 with HD and again today for hgb of 7.5 4. Secondary hyperparathyroidism- PTH 63- I think was on vit D as OP- phos now 4.4 no binder  5. HTN/volume- BP soft, amiodarone- not on any BP meds   Troy [name]    Labs: Basic Metabolic Panel: Recent Labs  Lab 04/30/18 0343 05/01/18 0340 05/02/18 0836  NA 138 136 138  K 4.3 3.8 3.4*  CL 102 101 102  CO2 24 27 26   GLUCOSE 86 96 117*  BUN 26* 17 38*  CREATININE 4.29* 2.98* 4.95*   CALCIUM 7.6* 7.6* 7.6*  PHOS 4.5 2.9 4.4   Liver Function Tests: Recent Labs  Lab 04/30/18 0343 05/01/18 0340 05/02/18 0836  ALBUMIN 2.5* 2.3* 2.4*   No results for input(s): LIPASE, AMYLASE in the last 168 hours. No results for input(s): AMMONIA in the last 168 hours. CBC: Recent Labs  Lab 04/28/18 0711 04/29/18 0424 04/30/18 0343 04/30/18 1520 05/01/18 0340 05/02/18 0339  WBC 12.4* 10.6* 16.8*  --  14.7* 16.7*  HGB 8.7* 8.5* 7.2* 9.3* 8.1* 7.5*  HCT 28.8* 27.4* 23.5* 29.9* 25.9* 25.1*  MCV 100.0 98.6 100.4*  --  97.7 99.6  PLT 224 197 272  --  228 246   Cardiac Enzymes: No results for input(s): CKTOTAL, CKMB, CKMBINDEX, TROPONINI in the last 168 hours. CBG: Recent Labs  Lab 04/29/18 1905 04/30/18 1235  GLUCAP 102* 76    Iron Studies: No results for input(s): IRON, TIBC, TRANSFERRIN, FERRITIN in the last 72 hours. Studies/Results: No results found. Medications: Infusions: . sodium chloride    . ferric gluconate (FERRLECIT/NULECIT) IV 125 mg (04/30/18 1214)    Scheduled Medications: . sodium chloride   Intravenous Once  . amiodarone  200 mg Oral Daily  . aspirin EC  81 mg Oral Daily  . atorvastatin  20 mg Oral QHS  . bacitracin-polymyxin b   Both Eyes QID  . Chlorhexidine Gluconate Cloth  6 each Topical Q0600  . clopidogrel  75 mg Oral Daily  . Darbepoetin Alfa  200 mcg Intravenous Q Tue-HD  . feeding supplement (NEPRO CARB STEADY)  237 mL Oral BID BM  . feeding supplement (PRO-STAT SUGAR FREE 64)  30 mL Oral Daily  . heparin  5,000 Units Subcutaneous Q8H  . levothyroxine  50 mcg Oral QAC breakfast  . lidocaine-prilocaine   Topical Q M,W,F  . multivitamin  1 tablet Oral QHS  . sodium chloride flush  3 mL Intravenous Q12H    have reviewed scheduled and prn medications.  Physical Exam: General: alert, pleaasant Heart: brady Lungs: mostly clear Abdomen: soft, non tender Extremities: min edema Dialysis Access: right sided temp cath placed 10/18  -not bleeding- left AVF wrapped but can appreciate bruit    05/02/2018,9:51 AM  LOS: 14 days

## 2018-05-02 NOTE — Progress Notes (Signed)
PT Cancellation Note  Patient Details Name: DAETON KLUTH MRN: [id number] DOB: June 16, 1941   Cancelled Treatment:    Reason Eval/Treat Not Completed: Patient at procedure or test/unavailable (HD). Will follow-up as schedule permits.  Mabeline Caras, PT, DPT Acute Rehabilitation Services  Pager [phone] Office [place] 05/02/2018, 9:00 AM

## 2018-05-03 DIAGNOSIS — T148XXA Other injury of unspecified body region, initial encounter: Secondary | ICD-10-CM

## 2018-05-03 DIAGNOSIS — I5022 Chronic systolic (congestive) heart failure: Secondary | ICD-10-CM

## 2018-05-03 LAB — CBC
HCT: 28.5 % — ABNORMAL LOW (ref 39.0–52.0)
Hemoglobin: 8.6 g/dL — ABNORMAL LOW (ref 13.0–17.0)
MCH: 29.7 pg (ref 26.0–34.0)
MCHC: 30.2 g/dL (ref 30.0–36.0)
MCV: 98.3 fL (ref 80.0–100.0)
Platelets: 259 10*3/uL (ref 150–400)
RBC: 2.9 MIL/uL — ABNORMAL LOW (ref 4.22–5.81)
RDW: 19.1 % — AB (ref 11.5–15.5)
WBC: 14.8 10*3/uL — ABNORMAL HIGH (ref 4.0–10.5)
nRBC: 0 % (ref 0.0–0.2)

## 2018-05-03 LAB — TYPE AND SCREEN
ABO/RH(D): B POS
Antibody Screen: NEGATIVE
Unit division: 0
Unit division: 0

## 2018-05-03 LAB — BPAM RBC
Blood Product Expiration Date: 201911182359
Blood Product Expiration Date: 201911222359
ISSUE DATE / TIME: 201910230927
ISSUE DATE / TIME: 201910250852
Unit Type and Rh: 7300
Unit Type and Rh: 7300

## 2018-05-03 MED ORDER — ALPRAZOLAM 0.25 MG PO TABS
0.2500 mg | ORAL_TABLET | Freq: Once | ORAL | Status: AC
  Administered 2018-05-03: 0.25 mg via ORAL
  Filled 2018-05-03: qty 1

## 2018-05-03 NOTE — Progress Notes (Signed)
Called to report to [place], [place], patient transferred with all belongings

## 2018-05-03 NOTE — Progress Notes (Signed)
Subjective:  HD yest- removed  592- UF limited by hypotension- no new c/o's  Objective Vital signs in last 24 hours: Vitals:   05/02/18 2236 05/03/18 0524 05/03/18 0830 05/03/18 1154  BP:  (!) 114/16 (!) 128/44 (!) 115/33  Pulse: 63 62 68 76  Resp: 12 (!) 3 14 18   Temp:  98 F (36.7 C) 97.8 F (36.6 C) 97.6 F (36.4 C)  TempSrc:  Axillary Oral Oral  SpO2: 95% 97% 98% 97%  Weight:      Height:       Weight change: -1.1 kg  Intake/Output Summary (Last 24 hours) at 05/03/2018 1227 Last data filed at 05/03/2018 9622 Gross per 24 hour  Intake 480 ml  Output 220 ml  Net 260 ml    Assessment/ Plan: Pt is a 77 y.o. yo male who was admitted on 04/18/2018 with new start to HD- hosp complicated by worsening cardiac status/Afib, cath showing CAD needs intervention- also poorly functioning AVF  Assessment/Plan: 1. Renal- new start to HD- now running MWF- has OP spot at [facility] MWF second shift upon D/C.  Access issues- bleeding from AVF- difficult to cannulate.  Now s/p temp cath per IR on 10/18.  VVS on board to do fistulogram and possible TDC if AVF not felt to be usable. VVS has put off fgram til next week the only issue is that he has a temp cath in place now 8 days, will be OK if can do early next week before temp cath NEEDS to be removed. Will tentatively plan for next HD Monday.  Has actually tolerated dialysis pretty well considering....  He wants to do it in a recliner on Monday :)  2. CAD/afib/CM- cath showing CAD- s/p stenting to LAD per cards  3. Anemia- CKD and ongoing bleeding - ESA and iron being given- drifting down- gave blood 10/23 with HD and 10/25 for hgb of 7.5--- is 8.6 today  4. Secondary hyperparathyroidism- PTH 63- I think was on vit D as OP- phos now 4.4 no binder  5. HTN/volume- BP soft, amiodarone- not on any BP meds   [name]    Labs: Basic Metabolic Panel: Recent Labs  Lab 04/30/18 0343 05/01/18 0340 05/02/18 0836  NA 138 136 138  K 4.3 3.8  3.4*  CL 102 101 102  CO2 24 27 26   GLUCOSE 86 96 117*  BUN 26* 17 38*  CREATININE 4.29* 2.98* 4.95*  CALCIUM 7.6* 7.6* 7.6*  PHOS 4.5 2.9 4.4   Liver Function Tests: Recent Labs  Lab 04/30/18 0343 05/01/18 0340 05/02/18 0836  ALBUMIN 2.5* 2.3* 2.4*   No results for input(s): LIPASE, AMYLASE in the last 168 hours. No results for input(s): AMMONIA in the last 168 hours. CBC: Recent Labs  Lab 04/29/18 0424 04/30/18 0343  05/01/18 0340 05/02/18 0339 05/03/18 0450  WBC 10.6* 16.8*  --  14.7* 16.7* 14.8*  HGB 8.5* 7.2*   < > 8.1* 7.5* 8.6*  HCT 27.4* 23.5*   < > 25.9* 25.1* 28.5*  MCV 98.6 100.4*  --  97.7 99.6 98.3  PLT 197 272  --  228 246 259   < > = values in this interval not displayed.   Cardiac Enzymes: No results for input(s): CKTOTAL, CKMB, CKMBINDEX, TROPONINI in the last 168 hours. CBG: Recent Labs  Lab 04/29/18 1905 04/30/18 1235  GLUCAP 102* 76    Iron Studies: No results for input(s): IRON, TIBC, TRANSFERRIN, FERRITIN in the last 72 hours. Studies/Results:  [place] Rt Upper Extrem Ltd Soft Tissue Non Vascular  Result Date: 05/02/2018 CLINICAL DATA:  77 year old with possible hematoma in the right upper extremity. EXAM: ULTRASOUND RIGHT UPPER EXTREMITY LIMITED TECHNIQUE: Ultrasound examination of the upper extremity soft tissues was performed in the area of clinical concern. COMPARISON:  None FINDINGS: Subcutaneous edema at the area of concern. Images are labeled as right upper forearm. A small amount of fluid around vascular structures but this is not a dedicated vascular exam and not clear which vascular structures being imaged. IMPRESSION: Small amount of fluid and subcutaneous edema at the area of concern. Fluid is near vascular structures but not clear which vascular structures are being imaged. Electronically Signed   By: Markus Daft M.D.   On: 05/02/2018 11:38   Medications: Infusions: . sodium chloride      Scheduled Medications: . amiodarone  200 mg  Oral Daily  . aspirin EC  81 mg Oral Daily  . atorvastatin  20 mg Oral QHS  . bacitracin-polymyxin b   Both Eyes QID  . Chlorhexidine Gluconate Cloth  6 each Topical Q0600  . clopidogrel  75 mg Oral Daily  . Darbepoetin Alfa  200 mcg Intravenous Q Fri-HD  . feeding supplement (NEPRO CARB STEADY)  237 mL Oral BID BM  . feeding supplement (PRO-STAT SUGAR FREE 64)  30 mL Oral Daily  . levothyroxine  50 mcg Oral QAC breakfast  . lidocaine-prilocaine   Topical Q M,W,F  . multivitamin  1 tablet Oral QHS  . sodium chloride flush  3 mL Intravenous Q12H    have reviewed scheduled and prn medications.  Physical Exam: General: alert, pleaasant Heart: brady Lungs: mostly clear Abdomen: soft, non tender Extremities: min edema Dialysis Access: right sided temp cath placed 10/18 -not bleeding- left AVF wrapped but can appreciate bruit    05/03/2018,12:27 PM  LOS: 15 days

## 2018-05-03 NOTE — Progress Notes (Signed)
PROGRESS NOTE    Troy [name]  TIR:[id number] DOB: [birth year] DOA: 04/18/2018 PCP: Gaynelle Arabian, MD    Brief Narrative:  77 y.o. 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 Also noted to have progressive renal failure with uremia for which nephrology was consulted and began hemodialysis 10/11 through remotely placed left RC AVF. An outpatient HD chair has been arranged.   Echocardiogram demonstrated worsening of LV dysfunction (EF 45-50% 2017 now 25-30%). Cardiology was consulted, recommending heart catheterization which the patient and family consented to after discussions with palliative care consultants. Left heart catheterization on 10/16 after vitamin K given for supratherapeutic INR showed LAD and RCA disease, though only LAD lesion was felt to be amenable to stenting. On 10/17 he developed erythema proximal to fistula site for which vancomycin and aztreonam were started empirically. Stenting to LAD was scheduled but postponed due to lack of dialysis access (fistula not able to be cannulated). IR placed dialysis catheter in right IJ for HD, though this has had recurrent bleeding issues for which dermabond and suturing were performed 10/20 with stability. Course further complicated by anemia due to bleeding and hematoma formation on the right arm (U/S ruled out clot). LHC planned 10/22 to be followed by fistulogram. Ultimate SNF disposition planned.  Assessment & Plan:   Principal Problem:   Acute on chronic systolic heart failure (HCC) Active Problems:   CKD (chronic kidney disease), stage V (HCC)   ESRD (end stage  renal disease) (HCC)   Leukocytosis   Atypical chest pain   Paroxysmal A-fib (HCC)   NSTEMI (non-ST elevated myocardial infarction) [facility])   Myocardial infarction [facility])   Palliative care by specialist   DNR (do not resuscitate) discussion  NSTEMI: With hx abnormal stress testing Jan 2018 not followed by LHC due to CKD.  - LHC 04/23/2018 showed LAD and RCA lesions, plan PCI to LAD 10/22 (delayed due to fistula issues) with further plan based on clinical course (aggressive medical management vs. staged PCI to RCA). -Continued DAPT, statin, and antihypertensives/antianginals as BP will tolerate. Per Cardiology note, plan would by to continue DAPT x30 days without coumadin, then stop aspirin, continue plavix and restart coumadin -currently stable with out chest pains  Uncontrolled bleeding: Difficult situation in patient that requires heparin for confirmed NSTEMI. Qualitative platelet dysfunction likely contributing.  - Continued with platelet as per above as tolerated.  -s/p 1 unit PRBC for hgb down to 7.2 with hgb up to 9.3, repeat 8.1 -Hgb 7.5 with one more unit PRBC ordered. -repeat cbc in AM.  -Ordered and reviewed soft tissue US of RUE. Findings of small amount of fluid and subcutaneous edema -Had earlier discussed case with General Surgery who does not recommend surgical drainage -Vascular Surgery following. S/p compression tx 10/24 -Stable at this time  Chronic combined HFrEF: EF worsened to 25-30%.  - Manage volume with HD.  - Otherwise on guideline directed therapy as BP allows - Tolerating HD  Sinus bradycardia: Does not seem to be symptomatic.  - BB remains on hold  ESRD: CLIP process completed, has spot at [facility] MWF at 12:15pm.  - Dialysis per nephrology  last 10/21 - Planned fistulogram for next week  AVF difficulty cannulation: Patent on U/S 10/20.  - HD thru right IJ. - Vascular surgery consulted, planning fistulogram next week -RUE seems more swollen and  ecchymotic compared to LUE, although swelling appears to be improving today  Anemia of CKD and acute blood loss: s/p 1u PRBCs 10/11, 2u 10/12, 1 units 10/23  in setting of AVF bleeding and supratherapeutic INR, later had IJ cath site bleeding seems to have improved but now with significant hematoma/ecchymosis on right arm possibly related to blood draw. - Recheck CBC in AM -Hgb remains stable overnight - EPO per nephrology  Left arm cellulitis vs. fistula infection: WBC climbed 19 > 30, improving slowly with antibiotics.  - Initial blood cultures negative, repeat cultures neg - Given empiric vancomycin, aztreonam for total of 7 days  Right arm edema, hematoma, extravasation: No SVT or DVT on U/S. Unable to DC blood thinners - Elevate above the level of the heart -RUE soft tissue US ordered and reviewed. Small fluid collection noted -Earlier discussed with General Surgery who did not recommend drainage of lesion.  -will stop subq heparin and transition to SCD's for DVT prophylaxis -Per above, R UE seems less swollen today  Acute toxic metabolic encephalopathy: Due to uremia.  -Mentation seems to be at baseline this AM  Paroxysmal AFib:  - Continue amiodarone. Held BB secondary to bradycardia - Continued on anticoagulation per above per Cardiology - Rate controlled at present  Supratherapeutic INR: Resolved s/p vit K.  PVD, carotid stenosis: s/p CEA 2011.  - Continue ASA, statin as tolerated  Hypothyroidism: Chronic, stable.  - Continue synthroid 35mcg - Stable currently  DVT prophylaxis: Heparin subq Code Status: Full Family Communication: Pt in room, family not at bedside Disposition Plan: Uncertain at this time  Consultants:   Cardiology  Nephrology  Vascular Surgery  Procedures:   [facility] 04/23/2018  Antimicrobials: Anti-infectives (From admission, onward)   Start     Dose/Rate Route Frequency Ordered Stop   05/01/18 1200  vancomycin (VANCOCIN) IVPB 750  mg/150 ml premix  Status:  Discontinued     750 mg 150 mL/hr over 60 Minutes Intravenous Every T-Th-Sa (Hemodialysis) 04/28/18 2115 04/29/18 1248   04/29/18 2200  aztreonam (AZACTAM) 0.5 g in dextrose 5 % 50 mL IVPB     0.5 g 100 mL/hr over 30 Minutes Intravenous Every 12 hours 04/29/18 1248 04/30/18 2223   04/29/18 1200  vancomycin (VANCOCIN) IVPB 750 mg/150 ml premix  Status:  Discontinued     750 mg 150 mL/hr over 60 Minutes Intravenous Every T-Th-Sa (Hemodialysis) 04/27/18 1037 04/28/18 2115   04/28/18 2200  vancomycin (VANCOCIN) IVPB 750 mg/150 ml premix     750 mg 150 mL/hr over 60 Minutes Intravenous  Once 04/28/18 2114 04/29/18 0034   04/28/18 1200  vancomycin (VANCOCIN) IVPB 750 mg/150 ml premix  Status:  Discontinued     750 mg 150 mL/hr over 60 Minutes Intravenous Every M-W-F (Hemodialysis) 04/25/18 1007 04/27/18 1037   04/26/18 1731  Vancomycin (VANCOCIN) 750-5 MG/150ML-% IVPB    Note to Pharmacy:  Alex Gardener   : cabinet override      04/26/18 1731 04/27/18 0544   04/26/18 1200  vancomycin (VANCOCIN) IVPB 750 mg/150 ml premix     750 mg 150 mL/hr over 60 Minutes Intravenous Every Sat (Hemodialysis) 04/26/18 1049 04/26/18 1900   04/25/18 1200  vancomycin (VANCOCIN) IVPB 750 mg/150 ml premix  Status:  Discontinued     750  mg 150 mL/hr over 60 Minutes Intravenous Every M-W-F (Hemodialysis) 04/24/18 0545 04/25/18 1007   04/24/18 0630  aztreonam (AZACTAM) 0.5 g in dextrose 5 % 50 mL IVPB  Status:  Discontinued     0.5 g 100 mL/hr over 30 Minutes Intravenous Every 12 hours 04/24/18 0552 04/29/18 1248   04/24/18 0600  vancomycin (VANCOCIN) 1,250 mg in sodium chloride 0.9 % 250 mL IVPB     1,250 mg 166.7 mL/hr over 90 Minutes Intravenous  Once 04/24/18 0545 04/24/18 0808      Subjective: No complaints this AM. Eager to start therapy  Objective: Vitals:   05/03/18 0524 05/03/18 0830 05/03/18 1154 05/03/18 1434  BP: (!) 114/16 (!) 128/44 (!) 115/33 (!) 115/35  Pulse: 62  68 76 64  Resp: (!) 3 14 18    Temp: 98 F (36.7 C) 97.8 F (36.6 C) 97.6 F (36.4 C) 98.7 F (37.1 C)  TempSrc: Axillary Oral Oral Oral  SpO2: 97% 98% 97% 98%  Weight:      Height:        Intake/Output Summary (Last 24 hours) at 05/03/2018 1540 Last data filed at 05/03/2018 0086 Gross per 24 hour  Intake 480 ml  Output 220 ml  Net 260 ml   Filed Weights   05/02/18 0438 05/02/18 0807 05/02/18 1216  Weight: 74.9 kg 73.8 kg 73.5 kg    Examination: General exam: Awake, laying in bed, in nad Respiratory system: Normal respiratory effort, no wheezing Cardiovascular system: regular rate, s1, s2 Gastrointestinal system: Soft, nondistended, positive BS Central nervous system: CN2-12 grossly intact, strength intact Extremities: RUE swollen but improved from yesterday Skin: Normal skin turgor, no notable skin lesions seen, multiple areas of ecchymosis Psychiatry: Mood normal // no visual hallucinations   Data Reviewed: I have personally reviewed following labs and imaging studies  CBC: Recent Labs  Lab 04/29/18 0424 04/30/18 0343 04/30/18 1520 05/01/18 0340 05/02/18 0339 05/03/18 0450  WBC 10.6* 16.8*  --  14.7* 16.7* 14.8*  HGB 8.5* 7.2* 9.3* 8.1* 7.5* 8.6*  HCT 27.4* 23.5* 29.9* 25.9* 25.1* 28.5*  MCV 98.6 100.4*  --  97.7 99.6 98.3  PLT 197 272  --  228 246 761   Basic Metabolic Panel: Recent Labs  Lab 04/28/18 0711 04/29/18 0424 04/30/18 0343 05/01/18 0340 05/02/18 0836  NA 136 137 138 136 138  K 3.5 3.8 4.3 3.8 3.4*  CL 101 101 102 101 102  CO2 23 26 24 27 26   GLUCOSE 90 84 86 96 117*  BUN 38* 11 26* 17 38*  CREATININE 5.44* 2.74* 4.29* 2.98* 4.95*  CALCIUM 7.2* 7.7* 7.6* 7.6* 7.6*  PHOS 4.9* 2.5 4.5 2.9 4.4   GFR: Estimated Creatinine Clearance: 11.3 mL/min (A) (by C-G formula based on SCr of 4.95 mg/dL (H)). Liver Function Tests: Recent Labs  Lab 04/28/18 0711 04/29/18 0424 04/30/18 0343 05/01/18 0340 05/02/18 0836  ALBUMIN 2.3* 2.7* 2.5*  2.3* 2.4*   No results for input(s): LIPASE, AMYLASE in the last 168 hours. No results for input(s): AMMONIA in the last 168 hours. Coagulation Profile: No results for input(s): INR, PROTIME in the last 168 hours. Cardiac Enzymes: No results for input(s): CKTOTAL, CKMB, CKMBINDEX, TROPONINI in the last 168 hours. BNP (last 3 results) No results for input(s): PROBNP in the last 8760 hours. HbA1C: No results for input(s): HGBA1C in the last 72 hours. CBG: Recent Labs  Lab 04/29/18 1905 04/30/18 1235  GLUCAP 102* 76   Lipid Profile: No results for input(s):  CHOL, HDL, LDLCALC, TRIG, CHOLHDL, LDLDIRECT in the last 72 hours. Thyroid Function Tests: No results for input(s): TSH, T4TOTAL, FREET4, T3FREE, THYROIDAB in the last 72 hours. Anemia Panel: No results for input(s): VITAMINB12, FOLATE, FERRITIN, TIBC, IRON, RETICCTPCT in the last 72 hours. Sepsis Labs: No results for input(s): PROCALCITON, LATICACIDVEN in the last 168 hours.  Recent Results (from the past 240 hour(s))  Culture, blood (routine x 2)     Status: None   Collection Time: 04/24/18  6:00 AM  Result Value Ref Range Status   Specimen Description BLOOD RIGHT ANTECUBITAL  Final   Special Requests   Final    BOTTLES DRAWN AEROBIC AND ANAEROBIC Blood Culture adequate volume   Culture   Final    NO GROWTH 5 DAYS Performed at [facility], [address], [place], [place] [address]    Report Status 04/29/2018 FINAL  Final  Culture, blood (routine x 2)     Status: None   Collection Time: 04/24/18  6:15 AM  Result Value Ref Range Status   Specimen Description BLOOD RIGHT HAND  Final   Special Requests   Final    BOTTLES DRAWN AEROBIC AND ANAEROBIC Blood Culture adequate volume   Culture   Final    NO GROWTH 5 DAYS Performed at [facility], [place] [address] [place],  [address]    Report Status 04/29/2018 FINAL  Final     Radiology Studies: [place] Rt Upper Extrem Ltd Soft Tissue Non  Vascular  Result Date: 05/02/2018 CLINICAL DATA:  77 year old with possible hematoma in the right upper extremity. EXAM: ULTRASOUND RIGHT UPPER EXTREMITY LIMITED TECHNIQUE: Ultrasound examination of the upper extremity soft tissues was performed in the area of clinical concern. COMPARISON:  None FINDINGS: Subcutaneous edema at the area of concern. Images are labeled as right upper forearm. A small amount of fluid around vascular structures but this is not a dedicated vascular exam and not clear which vascular structures being imaged. IMPRESSION: Small amount of fluid and subcutaneous edema at the area of concern. Fluid is near vascular structures but not clear which vascular structures are being imaged. Electronically Signed   By: Markus Daft M.D.   On: 05/02/2018 11:38    Scheduled Meds: . amiodarone  200 mg Oral Daily  . aspirin EC  81 mg Oral Daily  . atorvastatin  20 mg Oral QHS  . bacitracin-polymyxin b   Both Eyes QID  . Chlorhexidine Gluconate Cloth  6 each Topical Q0600  . clopidogrel  75 mg Oral Daily  . Darbepoetin Alfa  200 mcg Intravenous Q Fri-HD  . feeding supplement (NEPRO CARB STEADY)  237 mL Oral BID BM  . feeding supplement (PRO-STAT SUGAR FREE 64)  30 mL Oral Daily  . levothyroxine  50 mcg Oral QAC breakfast  . lidocaine-prilocaine   Topical Q M,W,F  . multivitamin  1 tablet Oral QHS  . sodium chloride flush  3 mL Intravenous Q12H   Continuous Infusions: . sodium chloride       LOS: 15 days   Marylu Lund, MD [facility] Pager On Amion  If 7PM-7AM, please contact night-coverage 05/03/2018, 3:40 PM

## 2018-05-03 NOTE — Progress Notes (Signed)
Progress Note  Patient Name: Troy [name] Date of Encounter: 05/03/2018  Primary Cardiologist: Kirk Ruths, MD   Subjective   No complaints.   Inpatient Medications    Scheduled Meds: . amiodarone  200 mg Oral Daily  . aspirin EC  81 mg Oral Daily  . atorvastatin  20 mg Oral QHS  . bacitracin-polymyxin b   Both Eyes QID  . Chlorhexidine Gluconate Cloth  6 each Topical Q0600  . clopidogrel  75 mg Oral Daily  . Darbepoetin Alfa  200 mcg Intravenous Q Fri-HD  . feeding supplement (NEPRO CARB STEADY)  237 mL Oral BID BM  . feeding supplement (PRO-STAT SUGAR FREE 64)  30 mL Oral Daily  . levothyroxine  50 mcg Oral QAC breakfast  . lidocaine-prilocaine   Topical Q M,W,F  . multivitamin  1 tablet Oral QHS  . sodium chloride flush  3 mL Intravenous Q12H   Continuous Infusions: . sodium chloride     PRN Meds: sodium chloride, acetaminophen, heparin, lidocaine, morphine injection, nitroGLYCERIN, ondansetron **OR** ondansetron (ZOFRAN) IV, oxyCODONE-acetaminophen, sodium chloride flush   Vital Signs    Vitals:   05/02/18 2232 05/02/18 2236 05/03/18 0524 05/03/18 0830  BP: (!) 127/14  (!) 114/16 (!) 128/44  Pulse: 62 63 62 68  Resp: 15 12 (!) 3 14  Temp:   98 F (36.7 C) 97.8 F (36.6 C)  TempSrc:   Axillary Oral  SpO2: 97% 95% 97% 98%  Weight:      Height:        Intake/Output Summary (Last 24 hours) at 05/03/2018 0836 Last data filed at 05/02/2018 1950 Gross per 24 hour  Intake 870 ml  Output 812 ml  Net 58 ml   Filed Weights   05/02/18 0438 05/02/18 0807 05/02/18 1216  Weight: 74.9 kg 73.8 kg 73.5 kg    Telemetry    SR - Personally Reviewed  ECG    na  Physical Exam   GEN: No acute distress.   Neck: No JVD Cardiac: RRR, 2/6 systolic murmur at apex no, rubs, or gallops.  Respiratory: Clear to auscultation bilaterally. GI: Soft, nontender, non-distended  MS: No edema; No deformity. Neuro:  Nonfocal  Psych: Normal affect   Labs      Chemistry Recent Labs  Lab 04/30/18 0343 05/01/18 0340 05/02/18 0836  NA 138 136 138  K 4.3 3.8 3.4*  CL 102 101 102  CO2 24 27 26   GLUCOSE 86 96 117*  BUN 26* 17 38*  CREATININE 4.29* 2.98* 4.95*  CALCIUM 7.6* 7.6* 7.6*  ALBUMIN 2.5* 2.3* 2.4*  GFRNONAA 12* 19* 10*  GFRAA 14* 22* 12*  ANIONGAP 12 8 10      Hematology Recent Labs  Lab 05/01/18 0340 05/02/18 0339 05/03/18 0450  WBC 14.7* 16.7* 14.8*  RBC 2.65* 2.52* 2.90*  HGB 8.1* 7.5* 8.6*  HCT 25.9* 25.1* 28.5*  MCV 97.7 99.6 98.3  MCH 30.6 29.8 29.7  MCHC 31.3 29.9* 30.2  RDW 19.0* 18.9* 19.1*  PLT 228 246 259    Cardiac EnzymesNo results for input(s): TROPONINI in the last 168 hours. No results for input(s): TROPIPOC in the last 168 hours.   BNPNo results for input(s): BNP, PROBNP in the last 168 hours.   DDimer No results for input(s): DDIMER in the last 168 hours.   Radiology    [place] Rt Upper Extrem Ltd Soft Tissue Non Vascular  Result Date: 05/02/2018 CLINICAL DATA:  77 year old with possible hematoma in the right upper  extremity. EXAM: ULTRASOUND RIGHT UPPER EXTREMITY LIMITED TECHNIQUE: Ultrasound examination of the upper extremity soft tissues was performed in the area of clinical concern. COMPARISON:  None FINDINGS: Subcutaneous edema at the area of concern. Images are labeled as right upper forearm. A small amount of fluid around vascular structures but this is not a dedicated vascular exam and not clear which vascular structures being imaged. IMPRESSION: Small amount of fluid and subcutaneous edema at the area of concern. Fluid is near vascular structures but not clear which vascular structures are being imaged. Electronically Signed   By: Markus Daft M.D.   On: 05/02/2018 11:38    Cardiac Studies    Patient Profile     77 y.o.malewith a hx of stage 5 CKD, abnormal stress test in 07/2016 (pt declined cath due to concerns regarding worsening renal function and need for HD), PVD with carotid artery  occlusion s/p left CEA in 2011, atrial fibrillation, chronic anticoagulation with coumadin, chronic combined systolic and diastolic HF moderate MR, HTN, HLD and h/o strokepresented with chest pain and ruled in for NSTEMI.  Echocardiogram shows newly reduced LV function with ejection fraction 25 to 30%. There is moderate mitral regurgitation. Cath 10/16 showed significant RCA and LAD disease, plan for staged PCI. Hospitalization complicated byrequiring HD,bleeding from L arm fistula site. Nowright groin false aneurysm status post right groin access for coronary interventionon 11/22.  Assessment & Plan    1. NSTEMI - s/p DES LADand medical therapy for residual dz RCA at 95% 04/29/18 - medical therapy ASA, atorva 20, plavix 75,. Plan to stop ASA after 30 days then start coumadin   2. Chronic systolic HF - - EF down to 25% by echo from previous 45% -No BB due to BP problems and hx bradycardia. NoACE/ARBdue to ESRD - volume controlled through HD   3. ESRD - per nephrology  4. PAF - on amio - from notes plans is for 30 days of DAPT, then stop ASA and start coumadin. Avoiding triple therapy due to advanced age and bleeding risk   5. Right groin pseudoaneurysm - resolved with compresoin, repeat imaging shows now recurrence.      For questions or updates, please contact [place] Please consult [email or web address] for contact info under        Signed, Carlyle Dolly, MD  05/03/2018, 8:36 AM

## 2018-05-04 DIAGNOSIS — I25119 Atherosclerotic heart disease of native coronary artery with unspecified angina pectoris: Secondary | ICD-10-CM

## 2018-05-04 LAB — CBC
HCT: 30.4 % — ABNORMAL LOW (ref 39.0–52.0)
Hemoglobin: 9.3 g/dL — ABNORMAL LOW (ref 13.0–17.0)
MCH: 30.2 pg (ref 26.0–34.0)
MCHC: 30.6 g/dL (ref 30.0–36.0)
MCV: 98.7 fL (ref 80.0–100.0)
Platelets: 322 10*3/uL (ref 150–400)
RBC: 3.08 MIL/uL — ABNORMAL LOW (ref 4.22–5.81)
RDW: 19.2 % — ABNORMAL HIGH (ref 11.5–15.5)
WBC: 13.8 10*3/uL — ABNORMAL HIGH (ref 4.0–10.5)
nRBC: 0 % (ref 0.0–0.2)

## 2018-05-04 MED ORDER — CHLORHEXIDINE GLUCONATE CLOTH 2 % EX PADS
6.0000 | MEDICATED_PAD | Freq: Every day | CUTANEOUS | Status: DC
  Administered 2018-05-05 – 2018-05-06 (×2): 6 via TOPICAL

## 2018-05-04 MED ORDER — ALPRAZOLAM 0.25 MG PO TABS
0.2500 mg | ORAL_TABLET | Freq: Once | ORAL | Status: AC
  Administered 2018-05-04: 0.25 mg via ORAL
  Filled 2018-05-04: qty 1

## 2018-05-04 NOTE — Progress Notes (Signed)
PROGRESS NOTE    Troy [name]  DGL:[id number] DOB: 1941-07-05 DOA: 04/18/2018 PCP: Gaynelle Arabian, MD    Brief Narrative:  77 y.o. 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 Also noted to have progressive renal failure with uremia for which nephrology was consulted and began hemodialysis 10/11 through remotely placed left RC AVF. An outpatient HD chair has been arranged.   Echocardiogram demonstrated worsening of LV dysfunction (EF 45-50% 2017 now 25-30%). Cardiology was consulted, recommending heart catheterization which the patient and family consented to after discussions with palliative care consultants. Left heart catheterization on 10/16 after vitamin K given for supratherapeutic INR showed LAD and RCA disease, though only LAD lesion was felt to be amenable to stenting. On 10/17 he developed erythema proximal to fistula site for which vancomycin and aztreonam were started empirically. Stenting to LAD was scheduled but postponed due to lack of dialysis access (fistula not able to be cannulated). IR placed dialysis catheter in right IJ for HD, though this has had recurrent bleeding issues for which dermabond and suturing were performed 10/20 with stability. Course further complicated by anemia due to bleeding and hematoma formation on the right arm (U/S ruled out clot). LHC planned 10/22 to be followed by fistulogram. [facility] SNF disposition planned.  Assessment & Plan:   Principal Problem:   Acute on chronic systolic heart failure (HCC) Active Problems:   CKD (chronic kidney disease), stage V (HCC)   ESRD (end stage  renal disease) (HCC)   Leukocytosis   Atypical chest pain   Paroxysmal A-fib (HCC)   NSTEMI (non-ST elevated myocardial infarction) [facility])   Myocardial infarction [facility])   Palliative care by specialist   DNR (do not resuscitate) discussion  NSTEMI: With hx abnormal stress testing Jan 2018 not followed by LHC due to CKD.  - LHC 04/23/2018 showed LAD and RCA lesions, plan PCI to LAD 10/22 (delayed due to fistula issues) with further plan based on clinical course (aggressive medical management vs. staged PCI to RCA). -Continued DAPT, statin, and antihypertensives/antianginals as BP will tolerate. Per Cardiology note, plan would by to continue DAPT x30 days without coumadin, then stop aspirin, continue plavix and restart coumadin -Stable this AM without chest pains  Uncontrolled bleeding: Difficult situation in patient that requires heparin for confirmed NSTEMI. Qualitative platelet dysfunction likely contributing.  - Continued with platelet as per above as tolerated.  -Ordered and reviewed soft tissue US of RUE. Findings of small amount of fluid and subcutaneous edema -Had earlier discussed case with General Surgery who does not recommend surgical drainage -Vascular Surgery following. S/p compression tx 10/24 -Hgb stable, higher today. Labs reviewed -Repeat cbc in AM  Chronic combined HFrEF: EF worsened to 25-30%.  - Manage volume with HD.  - Otherwise on guideline directed therapy as BP allows - Had been tolerating HD  Sinus bradycardia: Does not seem to be symptomatic.  - BB presently still on hold  ESRD: CLIP process completed, has spot at [facility] MWF at 12:15pm.  - Dialysis per nephrology last 10/21 - Planned fistulogram for this week per Vascular Surgery  AVF difficulty cannulation: Patent on U/S 10/20.  -  HD thru right IJ. - Vascular surgery consulted, planning fistulogram this week -RUE is more edematous than LUE, however does appear to be improving  Anemia of CKD and acute  blood loss: s/p 1u PRBCs 10/11, 2u 10/12, 1 units 10/23  in setting of AVF bleeding and supratherapeutic INR, later had IJ cath site bleeding seems to have improved but now with significant hematoma/ecchymosis on right arm possibly related to blood draw. - Repeat CBC in AM -Hgb remains stable overnight - EPO per nephrology  Left arm cellulitis vs. fistula infection: WBC climbed 19 > 30, improving slowly with antibiotics.  - Initial blood cultures negative, repeat cultures neg - Given empiric vancomycin, aztreonam for total of 7 days  Right arm edema, hematoma, extravasation: No SVT or DVT on U/S. Unable to DC blood thinners - Elevate above the level of the heart -RUE soft tissue US ordered and reviewed. Small fluid collection noted -Earlier discussed with General Surgery who did not recommend drainage of lesion.  -will stop subq heparin and transition to SCD's for DVT prophylaxis -RUE appears less swollen  Acute toxic metabolic encephalopathy: Due to uremia.  -Mentation is at baseline  Paroxysmal AFib:  - Continue amiodarone. Held BB secondary to bradycardia - Continued on anticoagulation per above per Cardiology - Remains rate controlled  Supratherapeutic INR: Resolved s/p vit K.  PVD, carotid stenosis: s/p CEA 2011.  -Will continue with ASA, statin as tolerated  Hypothyroidism: Chronic, stable.  - Continue synthroid 77mcg - Presently stable  DVT prophylaxis: Heparin subq Code Status: Full Family Communication: Pt in room, family not at bedside Disposition Plan: Uncertain at this time  Consultants:   Cardiology  Nephrology  Vascular Surgery  Procedures:   [facility] 04/23/2018  Antimicrobials: Anti-infectives (From admission, onward)   Start     Dose/Rate Route Frequency Ordered Stop   05/01/18 1200  vancomycin (VANCOCIN) IVPB 750 mg/150 ml premix  Status:  Discontinued     750 mg 150 mL/hr over 60 Minutes Intravenous Every T-Th-Sa (Hemodialysis) 04/28/18 2115  04/29/18 1248   04/29/18 2200  aztreonam (AZACTAM) 0.5 g in dextrose 5 % 50 mL IVPB     0.5 g 100 mL/hr over 30 Minutes Intravenous Every 12 hours 04/29/18 1248 04/30/18 2223   04/29/18 1200  vancomycin (VANCOCIN) IVPB 750 mg/150 ml premix  Status:  Discontinued     750 mg 150 mL/hr over 60 Minutes Intravenous Every T-Th-Sa (Hemodialysis) 04/27/18 1037 04/28/18 2115   04/28/18 2200  vancomycin (VANCOCIN) IVPB 750 mg/150 ml premix     750 mg 150 mL/hr over 60 Minutes Intravenous  Once 04/28/18 2114 04/29/18 0034   04/28/18 1200  vancomycin (VANCOCIN) IVPB 750 mg/150 ml premix  Status:  Discontinued     750 mg 150 mL/hr over 60 Minutes Intravenous Every M-W-F (Hemodialysis) 04/25/18 1007 04/27/18 1037   04/26/18 1731  Vancomycin (VANCOCIN) 750-5 MG/150ML-% IVPB    Note to Pharmacy:  Alex Gardener   : cabinet override      04/26/18 1731 04/27/18 0544   04/26/18 1200  vancomycin (VANCOCIN) IVPB 750 mg/150 ml premix     750 mg 150 mL/hr over 60 Minutes Intravenous Every Sat (Hemodialysis) 04/26/18 1049 04/26/18 1900   04/25/18 1200  vancomycin (VANCOCIN) IVPB 750 mg/150 ml premix  Status:  Discontinued     750 mg 150 mL/hr over 60 Minutes Intravenous Every M-W-F (Hemodialysis) 04/24/18 0545 04/25/18 1007   04/24/18 0630  aztreonam (AZACTAM) 0.5 g in dextrose 5 % 50 mL IVPB  Status:  Discontinued     0.5 g 100 mL/hr over 30 Minutes Intravenous Every 12 hours 04/24/18 0552 04/29/18 1248   04/24/18 0600  vancomycin (VANCOCIN) 1,250 mg in sodium chloride 0.9 % 250 mL IVPB     1,250 mg 166.7 mL/hr over 90 Minutes Intravenous  Once 04/24/18 0545 04/24/18 0808      Subjective: Eager to start therapy at SNF  Objective: Vitals:   05/03/18 2150 05/04/18 0500 05/04/18 0552 05/04/18 1433  BP: (!) 105/41  (!) 116/55 (!) 118/41  Pulse: 61  77 65  Resp: 18  14   Temp: 98.7 F (37.1 C)  97.9 F (36.6 C) 98.9 F (37.2 C)  TempSrc: Oral   Oral  SpO2: 93%  99% 98%  Weight:  79.4 kg      Height:        Intake/Output Summary (Last 24 hours) at 05/04/2018 1650 Last data filed at 05/04/2018 1200 Gross per 24 hour  Intake 240 ml  Output -  Net 240 ml   Filed Weights   05/02/18 0807 05/02/18 1216 05/04/18 0500  Weight: 73.8 kg 73.5 kg 79.4 kg    Examination: General exam: Conversant, in no acute distress Respiratory system: normal chest rise, clear, no audible wheezing Cardiovascular system: regular rhythm, s1-s2 Gastrointestinal system: Nondistended, nontender, pos BS Central nervous system: No seizures, no tremors Extremities: No cyanosis, no joint deformities, RUE swelling improved Skin: No rashes, no pallor Psychiatry: Affect normal // no auditory hallucinations   Data Reviewed: I have personally reviewed following labs and imaging studies  CBC: Recent Labs  Lab 04/30/18 0343 04/30/18 1520 05/01/18 0340 05/02/18 0339 05/03/18 0450 05/04/18 0752  WBC 16.8*  --  14.7* 16.7* 14.8* 13.8*  HGB 7.2* 9.3* 8.1* 7.5* 8.6* 9.3*  HCT 23.5* 29.9* 25.9* 25.1* 28.5* 30.4*  MCV 100.4*  --  97.7 99.6 98.3 98.7  PLT 272  --  228 246 259 270   Basic Metabolic Panel: Recent Labs  Lab 04/28/18 0711 04/29/18 0424 04/30/18 0343 05/01/18 0340 05/02/18 0836  NA 136 137 138 136 138  K 3.5 3.8 4.3 3.8 3.4*  CL 101 101 102 101 102  CO2 23 26 24 27 26   GLUCOSE 90 84 86 96 117*  BUN 38* 11 26* 17 38*  CREATININE 5.44* 2.74* 4.29* 2.98* 4.95*  CALCIUM 7.2* 7.7* 7.6* 7.6* 7.6*  PHOS 4.9* 2.5 4.5 2.9 4.4   GFR: Estimated Creatinine Clearance: 12.4 mL/min (A) (by C-G formula based on SCr of 4.95 mg/dL (H)). Liver Function Tests: Recent Labs  Lab 04/28/18 0711 04/29/18 0424 04/30/18 0343 05/01/18 0340 05/02/18 0836  ALBUMIN 2.3* 2.7* 2.5* 2.3* 2.4*   No results for input(s): LIPASE, AMYLASE in the last 168 hours. No results for input(s): AMMONIA in the last 168 hours. Coagulation Profile: No results for input(s): INR, PROTIME in the last 168 hours. Cardiac  Enzymes: No results for input(s): CKTOTAL, CKMB, CKMBINDEX, TROPONINI in the last 168 hours. BNP (last 3 results) No results for input(s): PROBNP in the last 8760 hours. HbA1C: No results for input(s): HGBA1C in the last 72 hours. CBG: Recent Labs  Lab 04/29/18 1905 04/30/18 1235  GLUCAP 102* 76   Lipid Profile: No results for input(s): CHOL, HDL, LDLCALC, TRIG, CHOLHDL, LDLDIRECT in the last 72 hours. Thyroid Function Tests: No results for input(s): TSH, T4TOTAL, FREET4, T3FREE, THYROIDAB in the last 72 hours. Anemia Panel: No results for input(s): VITAMINB12, FOLATE, FERRITIN, TIBC, IRON, RETICCTPCT in the last 72  hours. Sepsis Labs: No results for input(s): PROCALCITON, LATICACIDVEN in the last 168 hours.  No results found for this or any previous visit (from the past 240 hour(s)).   Radiology Studies: No results found.  Scheduled Meds: . amiodarone  200 mg Oral Daily  . aspirin EC  81 mg Oral Daily  . atorvastatin  20 mg Oral QHS  . bacitracin-polymyxin b   Both Eyes QID  . Chlorhexidine Gluconate Cloth  6 each Topical Q0600  . [START ON 05/05/2018] Chlorhexidine Gluconate Cloth  6 each Topical Q0600  . clopidogrel  75 mg Oral Daily  . Darbepoetin Alfa  200 mcg Intravenous Q Fri-HD  . feeding supplement (NEPRO CARB STEADY)  237 mL Oral BID BM  . feeding supplement (PRO-STAT SUGAR FREE 64)  30 mL Oral Daily  . levothyroxine  50 mcg Oral QAC breakfast  . lidocaine-prilocaine   Topical Q M,W,F  . multivitamin  1 tablet Oral QHS  . sodium chloride flush  3 mL Intravenous Q12H   Continuous Infusions: . sodium chloride       LOS: 16 days   Marylu Lund, MD [facility] Pager On Amion  If 7PM-7AM, please contact night-coverage 05/04/2018, 4:50 PM

## 2018-05-04 NOTE — Progress Notes (Signed)
Subjective:  - no new c/o's  Objective Vital signs in last 24 hours: Vitals:   05/03/18 1434 05/03/18 2150 05/04/18 0500 05/04/18 0552  BP: (!) 115/35 (!) 105/41  (!) 116/55  Pulse: 64 61  77  Resp:  18  14  Temp: 98.7 F (37.1 C) 98.7 F (37.1 C)  97.9 F (36.6 C)  TempSrc: Oral Oral    SpO2: 98% 93%  99%  Weight:   79.4 kg   Height:       Weight change: 5.6 kg No intake or output data in the 24 hours ending 05/04/18 1233  Assessment/ Plan: Pt is a 77 y.o. yo male who was admitted on 04/18/2018 with new start to HD- hosp complicated by worsening cardiac status/Afib, cath showing CAD s/p intervention- also poorly functioning AVF  Assessment/Plan: 1. Renal- new start to HD- now running MWF- has OP spot at [facility] MWF second shift upon D/C.  Access issues- bleeding from AVF- difficult to cannulate.  Now s/p temp cath per IR on 10/18.  VVS on board to do fistulogram and possible TDC if AVF not felt to be usable. VVS has put off fgram -  the only issue is that he has a temp cath in place now 9 days, will be OK if can do early next week before temp cath NEEDS to be removed. Will tentatively plan for next HD Monday.  Has actually tolerated dialysis pretty well considering....  He wants to do it in a recliner on Monday :)  2. CAD/afib/CM- cath showing CAD- s/p stenting to LAD per cards - plavix 3. Anemia- CKD and ongoing bleeding - ESA and iron being given- drifting down- gave blood 10/23 with HD and 10/25 for hgb of 7.5--- is 9.3 today  4. Secondary hyperparathyroidism- PTH 63- I think was on vit D as OP, none here- phos now 4.4 no binder  5. HTN/volume- BP soft, amiodarone- not on any BP meds   [name]    Labs: Basic Metabolic Panel: Recent Labs  Lab 04/30/18 0343 05/01/18 0340 05/02/18 0836  NA 138 136 138  K 4.3 3.8 3.4*  CL 102 101 102  CO2 24 27 26   GLUCOSE 86 96 117*  BUN 26* 17 38*  CREATININE 4.29* 2.98* 4.95*  CALCIUM 7.6* 7.6* 7.6*  PHOS 4.5 2.9 4.4    Liver Function Tests: Recent Labs  Lab 04/30/18 0343 05/01/18 0340 05/02/18 0836  ALBUMIN 2.5* 2.3* 2.4*   No results for input(s): LIPASE, AMYLASE in the last 168 hours. No results for input(s): AMMONIA in the last 168 hours. CBC: Recent Labs  Lab 04/30/18 0343  05/01/18 0340 05/02/18 0339 05/03/18 0450 05/04/18 0752  WBC 16.8*  --  14.7* 16.7* 14.8* 13.8*  HGB 7.2*   < > 8.1* 7.5* 8.6* 9.3*  HCT 23.5*   < > 25.9* 25.1* 28.5* 30.4*  MCV 100.4*  --  97.7 99.6 98.3 98.7  PLT 272  --  228 246 259 322   < > = values in this interval not displayed.   Cardiac Enzymes: No results for input(s): CKTOTAL, CKMB, CKMBINDEX, TROPONINI in the last 168 hours. CBG: Recent Labs  Lab 04/29/18 1905 04/30/18 1235  GLUCAP 102* 76    Iron Studies: No results for input(s): IRON, TIBC, TRANSFERRIN, FERRITIN in the last 72 hours. Studies/Results: No results found. Medications: Infusions: . sodium chloride      Scheduled Medications: . amiodarone  200 mg Oral Daily  . aspirin EC  81 mg Oral Daily  . atorvastatin  20 mg Oral QHS  . bacitracin-polymyxin b   Both Eyes QID  . Chlorhexidine Gluconate Cloth  6 each Topical Q0600  . clopidogrel  75 mg Oral Daily  . Darbepoetin Alfa  200 mcg Intravenous Q Fri-HD  . feeding supplement (NEPRO CARB STEADY)  237 mL Oral BID BM  . feeding supplement (PRO-STAT SUGAR FREE 64)  30 mL Oral Daily  . levothyroxine  50 mcg Oral QAC breakfast  . lidocaine-prilocaine   Topical Q M,W,F  . multivitamin  1 tablet Oral QHS  . sodium chloride flush  3 mL Intravenous Q12H    have reviewed scheduled and prn medications.  Physical Exam: General: alert, [place] Heart: brady Lungs: mostly clear Abdomen: soft, non tender Extremities: min edema Dialysis Access: right sided temp cath placed 10/18 -not bleeding- left AVF  can appreciate bruit - swelling on both arms    05/04/2018,12:33 PM  LOS: 16 days

## 2018-05-04 NOTE — Progress Notes (Addendum)
Progress Note  Patient Name: Troy [name] Date of Encounter: 05/04/2018  Primary Cardiologist: Stanford Breed  Subjective   No chest pain or SOB. Main complaint is swelling and bruising of the right upper extremity.   She is status post stenting of the left anterior descending artery.  Plan is for medical therapy for the right coronary artery.  No angina    Inpatient Medications    Scheduled Meds: . amiodarone  200 mg Oral Daily  . aspirin EC  81 mg Oral Daily  . atorvastatin  20 mg Oral QHS  . bacitracin-polymyxin b   Both Eyes QID  . Chlorhexidine Gluconate Cloth  6 each Topical Q0600  . clopidogrel  75 mg Oral Daily  . Darbepoetin Alfa  200 mcg Intravenous Q Fri-HD  . feeding supplement (NEPRO CARB STEADY)  237 mL Oral BID BM  . feeding supplement (PRO-STAT SUGAR FREE 64)  30 mL Oral Daily  . levothyroxine  50 mcg Oral QAC breakfast  . lidocaine-prilocaine   Topical Q M,W,F  . multivitamin  1 tablet Oral QHS  . sodium chloride flush  3 mL Intravenous Q12H   Continuous Infusions: . sodium chloride     PRN Meds: sodium chloride, acetaminophen, heparin, lidocaine, morphine injection, nitroGLYCERIN, ondansetron **OR** ondansetron (ZOFRAN) IV, oxyCODONE-acetaminophen, sodium chloride flush   Vital Signs    Vitals:   05/03/18 1434 05/03/18 2150 05/04/18 0500 05/04/18 0552  BP: (!) 115/35 (!) 105/41  (!) 116/55  Pulse: 64 61  77  Resp:  18  14  Temp: 98.7 F (37.1 C) 98.7 F (37.1 C)  97.9 F (36.6 C)  TempSrc: Oral Oral    SpO2: 98% 93%  99%  Weight:   79.4 kg   Height:        Intake/Output Summary (Last 24 hours) at 05/04/2018 0909 Last data filed at 05/03/2018 0925 Gross per 24 hour  Intake 240 ml  Output -  Net 240 ml   Filed Weights   05/02/18 0807 05/02/18 1216 05/04/18 0500  Weight: 73.8 kg 73.5 kg 79.4 kg    Telemetry    NSR with occasional PVCs - Personally Reviewed  ECG    n/a - Personally Reviewed  Physical Exam   GEN: No acute  distress.   Neck: No JVD. Cardiac: RRR, II/VI systolic murmur at the apex, no rubs, or gallops.  Respiratory: Clear to auscultation bilaterally.  GI: Soft, nontender, non-distended.   MS: Diffuse ecchymosis of the right upper extremity; No deformity. Neuro:  Alert and oriented x 3; Nonfocal.  Psych: Normal affect.  Labs    Chemistry Recent Labs  Lab 04/30/18 0343 05/01/18 0340 05/02/18 0836  NA 138 136 138  K 4.3 3.8 3.4*  CL 102 101 102  CO2 24 27 26   GLUCOSE 86 96 117*  BUN 26* 17 38*  CREATININE 4.29* 2.98* 4.95*  CALCIUM 7.6* 7.6* 7.6*  ALBUMIN 2.5* 2.3* 2.4*  GFRNONAA 12* 19* 10*  GFRAA 14* 22* 12*  ANIONGAP 12 8 10      Hematology Recent Labs  Lab 05/02/18 0339 05/03/18 0450 05/04/18 0752  WBC 16.7* 14.8* 13.8*  RBC 2.52* 2.90* 3.08*  HGB 7.5* 8.6* 9.3*  HCT 25.1* 28.5* 30.4*  MCV 99.6 98.3 98.7  MCH 29.8 29.7 30.2  MCHC 29.9* 30.2 30.6  RDW 18.9* 19.1* 19.2*  PLT 246 259 322    Cardiac EnzymesNo results for input(s): TROPONINI in the last 168 hours. No results for input(s): TROPIPOC in the  last 168 hours.   BNPNo results for input(s): BNP, PROBNP in the last 168 hours.   DDimer No results for input(s): DDIMER in the last 168 hours.   Radiology    No results found.  Cardiac Studies   Echo 04/19/2018: Study Conclusions  - Left ventricle: The cavity size was normal. Wall thickness was   increased in a pattern of mild LVH. Systolic function was   severely reduced. The estimated ejection fraction was in the   range of 25% to 30%. Diffuse hypokinesis with regional variation.   Doppler parameters are consistent with restrictive diastolic   physiology. The E&'e&' ratio is markedly elevated >20, suggesting   increased LV filling pressure. - Mitral valve: MAC with leaflet calcification. Moderate   regurgitation. - Left atrium: Severely dilated. - Right ventricle: The cavity size was mildly dilated. Moderately   reduced systolic function. - Right  atrium: The atrium was mildly dilated. - Tricuspid valve: There was mild regurgitation. - Pulmonary arteries: PA peak pressure: 41 mm Hg (S). - Inferior vena cava: The vessel was dilated. The respirophasic   diameter changes were blunted (< 50%), consistent with elevated   central venous pressure.  Impressions:  - Compared to a prior study in 2017, the LVEF is lower at 25-30%   with grade 3 DD and high LV fililng pressure, moderate RV   dysfunction and mild to moderate pulmonary hypertension with RVSP of 41 mmHg. __________  [facility] 04/23/2018: Conclusion     Severe 2 Vessel CAD:  Ost RCA to Prox RCA lesion is 95% stenosed. Prox RCA lesion is 80% stenosed. Prox RCA to Mid RCA lesion is 99% stenosed. Mid RCA lesion is 80% stenosed.  Ost LAD lesion is 30% stenosed. Prox LAD lesion is 95% stenosed.  Mid LM to Dist LM lesion is 35% stenosed.  Ost Cx to Prox Cx lesion is 60% stenosed. - very small caliber vessel  LV end diastolic pressure is moderately elevated.   Severe 2 Vessel CAD: 85% calcified pLAD, extensive calcified 95% ostial, 80&90% prox and 80% mid (modereate caliber) RCA with 90% tiny RPAV. 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  (Would be amenable to Impella)  Recommendation: Holding area for sheath removal.  Return to 6 E. for ongoing care. With severely calcified disease in the LAD and RCA that would likely require atherectomy in her low EF, would potentially benefit from Impella support --> with the patient being DNR and relatively poor status to begin with, would need to have conversation with other interventionalists and attending cardiologist along with family to determine the best  course of action going forward. -->  Perhaps medical management with palliative care would be an option.  I do think that attempted PCI would be best served with Impella support if possible.  I will restart IV heparin 8 hours after sheath removal. Will discuss with interventional cardiology colleagues to review images and determine potential interventional options and will communicate with Dr. Stanford Breed.  __________  [facility] 04/29/2018: Conclusion     Mid LM to Dist LM lesion is 35% stenosed.  Ost LAD lesion is 30% stenosed.  Prox LAD lesion is 95% stenosed.  A drug-eluting stent was successfully placed using a STENT SYNERGY DES 3X32.  Post intervention, there is a  0% residual stenosis.  Ost Cx to Prox Cx lesion is 60% stenosed.  Ost RCA to Prox RCA lesion is 95% stenosed.  Prox RCA lesion is 80% stenosed.  Prox RCA to Mid RCA lesion is 99% stenosed.  Mid RCA lesion is 80% stenosed.  LV end diastolic pressure is normal. LVEDP 12 mm Hg.  There is no aortic valve stenosis.     Recommend uninterrupted dual antiplatelet therapy with Aspirin 32m daily and Clopidogrel 729mdaily for a minimum of 12 months (ACS - Class I recommendation).   Patient was preloaded with Plavix and this was continued.  Collaterals to the RCA already developing.  Would treat medically for now.  RCA PCI would be higher risk given the long, calcified, diffuse disease.   __________  Patient Profile     89.o. male with history of CAD with NSTEMI this admission with prior abnormal stress test in 07/2016 with the patient declining cath due to concerns with worsening renal function and need for HD, carotid artery disease s/p left-sided CEA in 2011, PAF on Coumadin, chronic combined CHF, stroke, moderate mitral regurgitation, HTN, and HLD who was admitted with a NSTEMI.   Assessment & Plan    1. NSTEMI: -No chest pain -Status post PCI/DES to the LAD with medical therapy being advised for residual RCA  disease -DAPT with ASA 81 mg and Plavix 75 mg daily with plans to stop ASA after 30 days and start Coumadin at that time -Lipitor 20 mg, LDL 41 this admission  -Cardiac rehab  2. Chronic combined CHF: -Volume managed through HD -Prior EF 45%, now down to 25% this admission -Not currently on beta blocker secondary to prior bradycardia  -Not on ACEi/ARB/spiro/Entresto secondary to ESRD -Consider Imdur/hydralazine if BP allows in follow up  3. ESRD: -HD per nephrology   4. PAF: -Maintaining sinus rhythm on amiodarone  -Notes indicate the plan is to have the patient on DAPT x 3 days, then stop ASA and start Coumadin in an effort to avoid triple therapy given high bleeding risk with her comorbid conditions   5. Right groin pseudoaneurysm: -Resolved with compression with repeat imaging showing no recurrence   6. Anemia of chronic disease: -HGB stable at 9.3  7. Hypokalemia: -Per nephrology    For questions or updates, please contact [facility] consult [email or web address] for contact info under Cardiology/STEMI.    Signed, RyChristell FaithPA-C [facility]: ([phone]/27/2019, 9:09 AM   Attending Note:   The patient was seen and examined.  Agree with assessment and plan as noted above.  Changes made to the above note as needed.  Patient seen and independently examined with  RyChristell FaithPA .   We discussed all aspects of the encounter. I agree with the assessment and plan as stated above.  1.  Coronary artery disease: The patient is status post stenting of the LAD.  The right coronary lesion will be treated medically.  His right arm has large ecchymosis.  The hematomas seem to be stable by duplex scan.  Is very stable from a cardiac standpoint.  It appears that the plan is to have him do dialysis tomorrow and then possibly have a fistulogram in the next day or so.     I have spent a total of 40 minutes with patient reviewing hospital  notes , telemetry,  EKGs, labs and examining patient as well as establishing an assessment and plan that was discussed with the patient. > 50% of  time was spent in direct patient care.    Thayer Headings, Brooke Bonito., MD, [facility] 05/04/2018, 11:33 AM [address],  [place] Pager [phone]

## 2018-05-05 ENCOUNTER — Encounter (HOSPITAL_COMMUNITY): Payer: Self-pay | Admitting: Interventional Radiology

## 2018-05-05 LAB — RENAL FUNCTION PANEL
ALBUMIN: 2.3 g/dL — AB (ref 3.5–5.0)
ANION GAP: 9 (ref 5–15)
BUN: 56 mg/dL — ABNORMAL HIGH (ref 8–23)
CO2: 27 mmol/L (ref 22–32)
Calcium: 8.1 mg/dL — ABNORMAL LOW (ref 8.9–10.3)
Chloride: 104 mmol/L (ref 98–111)
Creatinine, Ser: 6.11 mg/dL — ABNORMAL HIGH (ref 0.61–1.24)
GFR calc Af Amer: 9 mL/min — ABNORMAL LOW (ref >60)
GFR, EST NON AFRICAN AMERICAN: 8 mL/min — AB (ref >60)
Glucose, Bld: 120 mg/dL — ABNORMAL HIGH (ref 70–99)
Phosphorus: 3.3 mg/dL (ref 2.5–4.6)
Potassium: 3.6 mmol/L (ref 3.5–5.1)
Sodium: 140 mmol/L (ref 135–145)

## 2018-05-05 LAB — PROTIME-INR
INR: 1.25
Prothrombin Time: 15.6 seconds — ABNORMAL HIGH (ref 11.4–15.2)

## 2018-05-05 LAB — CBC
HCT: 30.7 % — ABNORMAL LOW (ref 39.0–52.0)
HEMOGLOBIN: 9.6 g/dL — AB (ref 13.0–17.0)
MCH: 31.2 pg (ref 26.0–34.0)
MCHC: 31.3 g/dL (ref 30.0–36.0)
MCV: 99.7 fL (ref 80.0–100.0)
Platelets: 342 10*3/uL (ref 150–400)
RBC: 3.08 MIL/uL — AB (ref 4.22–5.81)
RDW: 19.5 % — ABNORMAL HIGH (ref 11.5–15.5)
WBC: 13.7 10*3/uL — ABNORMAL HIGH (ref 4.0–10.5)
nRBC: 0 % (ref 0.0–0.2)

## 2018-05-05 MED ORDER — VANCOMYCIN HCL IN DEXTROSE 1-5 GM/200ML-% IV SOLN
1000.0000 mg | INTRAVENOUS | Status: AC
  Administered 2018-05-06: 1000 mg via INTRAVENOUS
  Filled 2018-05-05 (×2): qty 200

## 2018-05-05 MED ORDER — ALTEPLASE 2 MG IJ SOLR
2.0000 mg | Freq: Once | INTRAMUSCULAR | Status: DC | PRN
  Administered 2018-05-05: 2.8 mg

## 2018-05-05 MED ORDER — ALTEPLASE 2 MG IJ SOLR
INTRAMUSCULAR | Status: AC
  Administered 2018-05-05: 2.8 mg
  Filled 2018-05-05: qty 4

## 2018-05-05 MED ORDER — MORPHINE SULFATE (PF) 2 MG/ML IV SOLN
INTRAVENOUS | Status: AC
  Filled 2018-05-05: qty 1

## 2018-05-05 NOTE — H&P (View-Only) (Signed)
  Progress Note    05/05/2018 9:51 AM Hospital Day 17  Subjective:  No complaints this morning.  Says the bandage over his fistula has been on at least a week.  Afebrile VSS  Vitals:   05/04/18 1930 05/05/18 0551  BP: (!) 168/58 (!) 148/58  Pulse: 75 67  Resp:  16  Temp:  (!) 97.5 F (36.4 C)  SpO2: 98% 100%    Physical Exam: Cardiac:  regular Lungs:  Non labored Extremities:  +palpable left radial pulse; the fistula is pulsatile and there is a pulsatile mass present over the fistula.       CBC    Component Value Date/Time   WBC 13.7 (H) 05/05/2018 0643   RBC 3.08 (L) 05/05/2018 0643   HGB 9.6 (L) 05/05/2018 0643   HGB 10.4 (L) 11/12/2016 0945   HCT 30.7 (L) 05/05/2018 0643   HCT 30.9 (L) 11/12/2016 0945   PLT 342 05/05/2018 0643   PLT 303 11/12/2016 0945   MCV 99.7 05/05/2018 0643   MCV 93 11/12/2016 0945   MCH 31.2 05/05/2018 0643   MCHC 31.3 05/05/2018 0643   RDW 19.5 (H) 05/05/2018 0643   RDW 17.0 (H) 11/12/2016 0945   LYMPHSABS 0.9 04/22/2018 0615   MONOABS 0.3 04/22/2018 0615   EOSABS 0.3 04/22/2018 0615   BASOSABS 0.0 04/22/2018 0615    BMET    Component Value Date/Time   NA 138 05/02/2018 0836   K 3.4 (L) 05/02/2018 0836   CL 102 05/02/2018 0836   CO2 26 05/02/2018 0836   GLUCOSE 117 (H) 05/02/2018 0836   BUN 38 (H) 05/02/2018 0836   CREATININE 4.95 (H) 05/02/2018 0836   CREATININE 4.32 (H) 06/14/2016 1236   CALCIUM 7.6 (L) 05/02/2018 0836   CALCIUM 8.6 03/21/2018 1015   GFRNONAA 10 (L) 05/02/2018 0836   GFRAA 12 (L) 05/02/2018 0836    INR    Component Value Date/Time   INR 1.51 04/25/2018 1108     Intake/Output Summary (Last 24 hours) at 05/05/2018 0951 Last data filed at 05/05/2018 0900 Gross per 24 hour  Intake 600 ml  Output 2 ml  Net 598 ml     Assessment/Plan:  77 y.o. male with ESRD who states he should dialyze today  Hospital Day 17  -pt's dressing removed over fistula found to have a pulsatile mass over  fistula.  Pt is scheduled to undergo fistulogram tomorrow, however, Dr. Scot Dock examined the pt and doesn't feel the fistula is salvageable.  Will plan for fistula ligation tomorrow and address pulsatile mass and replace the temporary catheter with a tunneled dialysis catheter by Dr. Scot Dock. -pt should dialyze today. -will check INR.  Please hold anticoagulation.  Npo after MN and consent. -right groin pseudoaneurysm resolved after compression.   Leontine Locket, PA-C [facility] [phone] 05/05/2018 9:51 AM  I have interviewed the patient and examined the patient. I agree with the findings by the PA. At significant risk for bleeding. I have recommended placement of TDC, ligation of AVF, and placement of new AVF/AVG in the upper arm. Discussed with patient.   Gae Gallop, MD [phone]

## 2018-05-05 NOTE — Progress Notes (Signed)
CSW continuing to follow for disposition planning. Patient's facility, [facility], to start a new [facility] authorization for patient. Patient will need new auth before admitting to the facility. CSW to follow for patient's medical readiness and will support.   Estanislado Emms, [place]

## 2018-05-05 NOTE — Progress Notes (Addendum)
  Progress Note    05/05/2018 9:51 AM Hospital Day 17  Subjective:  No complaints this morning.  Says the bandage over his fistula has been on at least a week.  Afebrile VSS  Vitals:   05/04/18 1930 05/05/18 0551  BP: (!) 168/58 (!) 148/58  Pulse: 75 67  Resp:  16  Temp:  (!) 97.5 F (36.4 C)  SpO2: 98% 100%    Physical Exam: Cardiac:  regular Lungs:  Non labored Extremities:  +palpable left radial pulse; the fistula is pulsatile and there is a pulsatile mass present over the fistula.       CBC    Component Value Date/Time   WBC 13.7 (H) 05/05/2018 0643   RBC 3.08 (L) 05/05/2018 0643   HGB 9.6 (L) 05/05/2018 0643   HGB 10.4 (L) 11/12/2016 0945   HCT 30.7 (L) 05/05/2018 0643   HCT 30.9 (L) 11/12/2016 0945   PLT 342 05/05/2018 0643   PLT 303 11/12/2016 0945   MCV 99.7 05/05/2018 0643   MCV 93 11/12/2016 0945   MCH 31.2 05/05/2018 0643   MCHC 31.3 05/05/2018 0643   RDW 19.5 (H) 05/05/2018 0643   RDW 17.0 (H) 11/12/2016 0945   LYMPHSABS 0.9 04/22/2018 0615   MONOABS 0.3 04/22/2018 0615   EOSABS 0.3 04/22/2018 0615   BASOSABS 0.0 04/22/2018 0615    BMET    Component Value Date/Time   NA 138 05/02/2018 0836   K 3.4 (L) 05/02/2018 0836   CL 102 05/02/2018 0836   CO2 26 05/02/2018 0836   GLUCOSE 117 (H) 05/02/2018 0836   BUN 38 (H) 05/02/2018 0836   CREATININE 4.95 (H) 05/02/2018 0836   CREATININE 4.32 (H) 06/14/2016 1236   CALCIUM 7.6 (L) 05/02/2018 0836   CALCIUM 8.6 03/21/2018 1015   GFRNONAA 10 (L) 05/02/2018 0836   GFRAA 12 (L) 05/02/2018 0836    INR    Component Value Date/Time   INR 1.51 04/25/2018 1108     Intake/Output Summary (Last 24 hours) at 05/05/2018 0951 Last data filed at 05/05/2018 0900 Gross per 24 hour  Intake 600 ml  Output 2 ml  Net 598 ml     Assessment/Plan:  77 y.o. male with ESRD who states he should dialyze today  Hospital Day 17  -pt's dressing removed over fistula found to have a pulsatile mass over  fistula.  Pt is scheduled to undergo fistulogram tomorrow, however, Dr. Scot Dock examined the pt and doesn't feel the fistula is salvageable.  Will plan for fistula ligation tomorrow and address pulsatile mass and replace the temporary catheter with a tunneled dialysis catheter by Dr. Scot Dock. -pt should dialyze today. -will check INR.  Please hold anticoagulation.  Npo after MN and consent. -right groin pseudoaneurysm resolved after compression.   Leontine Locket, PA-C [facility] [phone] 05/05/2018 9:51 AM  I have interviewed the patient and examined the patient. I agree with the findings by the PA. At significant risk for bleeding. I have recommended placement of TDC, ligation of AVF, and placement of new AVF/AVG in the upper arm. Discussed with patient.   Gae Gallop, MD [phone]

## 2018-05-05 NOTE — Progress Notes (Signed)
Occupational Therapy Treatment Patient Details Name: Troy [name] MRN: [id number] DOB: 1940-10-21 Today's Date: 05/05/2018    History of present illness Patient is a 77 y.o. male admitted 04/18/18 with chest pain. Found to have NSTEMI, uremia and new ESRD; started on HD. Pt with severe R CAD and significant LAD disease. IR placed RIJ HD cath, though this has had recurrent bleeding issues s/p suturing 10/20 for stability. Course further complicated by anemia due to bleeding and hematoma formation on RUE (U/S ruled out clot). S/p LHC 10/22. PMH includes stroke, PAF, HTN, CKD, CHF.   OT comments  Pt progressing towards OT goals, presents supine in bed pleasant and willing to participate in therapy session. Pt completed functional mobility using RW with overall minA. Continues to require mod-maxA for toileting ADLs, minA for LB ADLs given increased time/cues. Pt continues to have limitations due to fatigue requiring seated rest breaks throughout session. Feel SNF recommendation remains appropriate at this time. Will continue to follow acutely to progress pt towards established OT goals.   Follow Up Recommendations  SNF    Equipment Recommendations  Other (comment);3 in 1 bedside commode(TBD in next venue)          Precautions / Restrictions Precautions Precautions: Fall Restrictions Weight Bearing Restrictions: No       Mobility Bed Mobility Overal bed mobility: Needs Assistance Bed Mobility: Supine to Sit     Supine to sit: Min assist     General bed mobility comments: light minA to support trunk upon sitting upright; cues to scoot towards EOB  Transfers Overall transfer level: Needs assistance Equipment used: Rolling walker (2 wheeled) Transfers: Sit to/from Stand Sit to Stand: Min assist;Min guard Stand pivot transfers: Min guard       General transfer comment: minA from EOB; minguard from [facility] and recliner; cues to reach back for chair during descent    Balance  Overall balance assessment: Needs assistance Sitting-balance support: Feet supported;Single extremity supported Sitting balance-Leahy Scale: Fair     Standing balance support: During functional activity;Bilateral upper extremity supported Standing balance-Leahy Scale: Poor Standing balance comment: Reliant on UE support                           ADL either performed or assessed with clinical judgement   ADL Overall ADL's : Needs assistance/impaired                     Lower Body Dressing: Sit to/from stand;Moderate assistance Lower Body Dressing Details (indicate cue type and reason): pt able to doff/don new pair of socks given increased time/encouragement; VCs to use figure 4 technique Toilet Transfer: Minimal assistance;Stand-pivot;BSC;RW   Toileting- Clothing Manipulation and Hygiene: Maximal assistance;Sit to/from stand Toileting - Clothing Manipulation Details (indicate cue type and reason): assist for gown management and peri-care after BM; pt initially stood from EOB to attempt voiding bladder in urinal, while standing pt began having BM, returned to sitting and setup with BSC to transfer for further toileting     Functional mobility during ADLs: Minimal assistance;Rolling walker General ADL Comments: pt fatigued with activity and requires rest breaks, though motivated to participate with therapy                       Cognition Arousal/Alertness: Awake/alert Behavior During Therapy: [facility] for tasks assessed/performed Overall Cognitive Status: Within Functional Limits for tasks assessed  Exercises Other Exercises Other Exercises: Serial sit to stands x10- needs UE to push off   Shoulder Instructions       General Comments      Pertinent Vitals/ Pain       Pain Assessment: Faces Faces Pain Scale: Hurts even more Pain Location: BUEs Pain Descriptors / Indicators: Sore Pain  Intervention(s): Monitored during session;Limited activity within patient's tolerance;Repositioned  Home Living                                          Prior Functioning/Environment              Frequency  Min 2X/week        Progress Toward Goals  OT Goals(current goals can now be found in the care plan section)  Progress towards OT goals: Progressing toward goals  Acute Rehab OT Goals Patient Stated Goal: to get better OT Goal Formulation: With patient Time For Goal Achievement: 05/08/18 Potential to Achieve Goals: Good ADL Goals Pt Will Perform Upper Body Bathing: sitting;with supervision Pt Will Perform Lower Body Bathing: with min guard assist;sit to/from stand Pt Will Perform Upper Body Dressing: with set-up;sitting Pt Will Perform Lower Body Dressing: with min guard assist;sit to/from stand Pt Will Transfer to Toilet: with supervision;ambulating Pt Will Perform Toileting - Clothing Manipulation and hygiene: with min guard assist;sit to/from stand Additional ADL Goal #1: Pt will complete bed mobility at supervision level to prepare for OOB ADLs.  Plan Discharge plan remains appropriate    Co-evaluation                 AM-PAC PT "6 Clicks" Daily Activity     Outcome Measure   Help from another person eating meals?: None Help from another person taking care of personal grooming?: A Little Help from another person toileting, which includes using toliet, bedpan, or urinal?: A Lot Help from another person bathing (including washing, rinsing, drying)?: A Lot Help from another person to put on and taking off regular upper body clothing?: A Little Help from another person to put on and taking off regular lower body clothing?: A Lot 6 Click Score: 16    End of Session Equipment Utilized During Treatment: Rolling walker;Gait belt  OT Visit Diagnosis: Unsteadiness on feet (R26.81);Muscle weakness (generalized) (M62.81);Pain Pain -  Right/Left: Left Pain - part of body: Arm   Activity Tolerance Patient tolerated treatment well   Patient Left in chair;with call bell/phone within reach(handoff to PT to continue session)   Nurse Communication Mobility status        Time: 8099-8338 OT Time Calculation (min): 42 min  Charges: OT Treatments $Self Care/Home Management : 23-37 mins  Lou Cal, OT [facility] Pager [phone] Office [place] 05/05/2018, 1:06 PM

## 2018-05-05 NOTE — Progress Notes (Signed)
Progress Note  Patient Name: Troy [name] Date of Encounter: 05/05/2018  Primary Cardiologist: Kirk Ruths, MD   Subjective   Feeling well.  No chest pain.    Inpatient Medications    Scheduled Meds: . amiodarone  200 mg Oral Daily  . aspirin EC  81 mg Oral Daily  . atorvastatin  20 mg Oral QHS  . bacitracin-polymyxin b   Both Eyes QID  . Chlorhexidine Gluconate Cloth  6 each Topical Q0600  . Chlorhexidine Gluconate Cloth  6 each Topical Q0600  . clopidogrel  75 mg Oral Daily  . Darbepoetin Alfa  200 mcg Intravenous Q Fri-HD  . feeding supplement (NEPRO CARB STEADY)  237 mL Oral BID BM  . feeding supplement (PRO-STAT SUGAR FREE 64)  30 mL Oral Daily  . levothyroxine  50 mcg Oral QAC breakfast  . lidocaine-prilocaine   Topical Q M,W,F  . multivitamin  1 tablet Oral QHS  . sodium chloride flush  3 mL Intravenous Q12H   Continuous Infusions: . sodium chloride    . [START ON 05/06/2018] vancomycin     PRN Meds: sodium chloride, acetaminophen, heparin, lidocaine, morphine injection, nitroGLYCERIN, ondansetron **OR** ondansetron (ZOFRAN) IV, oxyCODONE-acetaminophen, sodium chloride flush   Vital Signs    Vitals:   05/05/18 0551 05/05/18 1315 05/05/18 1318 05/05/18 1330  BP: (!) 148/58 (!) 113/37 (!) 123/25 (!) 104/27  Pulse: 67 66 66 61  Resp: 16 18    Temp: (!) 97.5 F (36.4 C) 98.6 F (37 C)    TempSrc: Oral Oral    SpO2: 100%     Weight: 73.6 kg     Height:        Intake/Output Summary (Last 24 hours) at 05/05/2018 1452 Last data filed at 05/05/2018 1300 Gross per 24 hour  Intake 360 ml  Output 2 ml  Net 358 ml   Filed Weights   05/02/18 1216 05/04/18 0500 05/05/18 0551  Weight: 73.5 kg 79.4 kg 73.6 kg    Telemetry    Sinus rhythm.  PVCs.  - Personally Reviewed  ECG    n/a - Personally Reviewed  Physical Exam   VS:  BP (!) 106/26   Pulse 67   Temp 98.6 F (37 C) (Oral)   Resp 18   Ht 5' 6"  (1.676 m)   Wt 73.6 kg   SpO2 100%   BMI  26.18 kg/m  , BMI Body mass index is 26.18 kg/m. GENERAL:  Well appearing HEENT: Pupils equal round and reactive, fundi not visualized, oral mucosa unremarkable NECK:  No jugular venous distention, waveform within normal limits, carotid upstroke brisk and symmetric, no bruits LUNGS:  Clear to auscultation bilaterally HEART:  RRR.  PMI not displaced or sustained,S1 and S2 within normal limits, no S3, no S4, no clicks, no rubs, no murmurs ABD:  Flat, positive bowel sounds normal in frequency in pitch, no bruits, no rebound, no guarding, no midline pulsatile mass, no hepatomegaly, no splenomegaly EXT:  2 plus pulses throughout, no edema, no cyanosis no clubbing SKIN:  No rashes no nodules NEURO:  Cranial nerves II through XII grossly intact, motor grossly intact throughout PSYCH:  Cognitively intact, oriented to person place and time   Labs    Chemistry Recent Labs  Lab 05/01/18 0340 05/02/18 0836 05/05/18 1303  NA 136 138 140  K 3.8 3.4* 3.6  CL 101 102 104  CO2 27 26 27   GLUCOSE 96 117* 120*  BUN 17 38* 56*  CREATININE  2.98* 4.95* 6.11*  CALCIUM 7.6* 7.6* 8.1*  ALBUMIN 2.3* 2.4* 2.3*  GFRNONAA 19* 10* 8*  GFRAA 22* 12* 9*  ANIONGAP 8 10 9      Hematology Recent Labs  Lab 05/03/18 0450 05/04/18 0752 05/05/18 0643  WBC 14.8* 13.8* 13.7*  RBC 2.90* 3.08* 3.08*  HGB 8.6* 9.3* 9.6*  HCT 28.5* 30.4* 30.7*  MCV 98.3 98.7 99.7  MCH 29.7 30.2 31.2  MCHC 30.2 30.6 31.3  RDW 19.1* 19.2* 19.5*  PLT 259 322 342    Cardiac EnzymesNo results for input(s): TROPONINI in the last 168 hours. No results for input(s): TROPIPOC in the last 168 hours.   BNPNo results for input(s): BNP, PROBNP in the last 168 hours.   DDimer No results for input(s): DDIMER in the last 168 hours.   Radiology    No results found.  Cardiac Studies   LHC 04/29/18:  Mid LM to Dist LM lesion is 35% stenosed.  Ost LAD lesion is 30% stenosed.  Prox LAD lesion is 95% stenosed.  A drug-eluting  stent was successfully placed using a STENT SYNERGY DES 3X32.  Post intervention, there is a 0% residual stenosis.  Ost Cx to Prox Cx lesion is 60% stenosed.  Ost RCA to Prox RCA lesion is 95% stenosed.  Prox RCA lesion is 80% stenosed.  Prox RCA to Mid RCA lesion is 99% stenosed.  Mid RCA lesion is 80% stenosed.  LV end diastolic pressure is normal. LVEDP 12 mm Hg.  There is no aortic valve stenosis.  Recommend uninterrupted dual antiplatelet therapy with Aspirin 33m daily and Clopidogrel 749mdaily for a minimum of 12 months (ACS - Class I recommendation).   Patient was preloaded with Plavix and this was continued.  Collaterals to the RCA already developing.  Would treat medically for now.  RCA PCI would be higher risk given the long, calcified, diffuse disease.   Echo 04/19/18: Study Conclusions  - Left ventricle: The cavity size was normal. Wall thickness was   increased in a pattern of mild LVH. Systolic function was   severely reduced. The estimated ejection fraction was in the   range of 25% to 30%. Diffuse hypokinesis with regional variation.   Doppler parameters are consistent with restrictive diastolic   physiology. The E&'e&' ratio is markedly elevated >20, suggesting   increased LV filling pressure. - Mitral valve: MAC with leaflet calcification. Moderate   regurgitation. - Left atrium: Severely dilated. - Right ventricle: The cavity size was mildly dilated. Moderately   reduced systolic function. - Right atrium: The atrium was mildly dilated. - Tricuspid valve: There was mild regurgitation. - Pulmonary arteries: PA peak pressure: 41 mm Hg (S). - Inferior vena cava: The vessel was dilated. The respirophasic   diameter changes were blunted (< 50%), consistent with elevated   central venous pressure.     Patient Profile     [age].o. male with CAD, acute on chronic renal failure, carotid artery disease status post left CEA in 2011, paroxysmal atrial  fibrillation on Coumadin, tonic systolic and diastolic heart failure, prior CVA, hypertension, and hyperlipidemia here with NSTEMI.  Now status post LAD PCI.  Assessment & Plan    # CAD: # NSTEMI: S/P LAD PCI.  Plan for medical management of the RCA.  Continue aspirin, Plavix and atorvastatin.  No beta blocker 2/2 hypotension.  DAPT with ASA 81 mg and Plavix 75 mg daily with plans to stop ASA after 30 days and start Coumadin at that time.  #  Acute systolic and diastolic heart failure: LVEF now 25% down from 45% previously.  No beta blocker due to bradycardia.  No ACE-I/ARB 2/2 2/2 ESRD.  Consider if there is no chance of renal recovery and OK per nephrology.  Volume managed with HD.    # Paroxysmal atrial fibrillation: Continue amiodarone.  Currently in sinus rhythm.  Start warfarin in 30 days.   # ESRD:  Newly started HD this admission.    [facility] will sign off.   Medication Recommendations:  Start coumadin 30 days post PCI and stop aspirin at that time.  Other recommendations (labs, testing, etc):   Follow up as an outpatient:  We will arrange.  Please call us when patient is nearly ready for discharge.   For questions or updates, please contact [place] Please consult [email or web address] for contact info under        Signed, Skeet Latch, MD  05/05/2018, 2:52 PM

## 2018-05-05 NOTE — Progress Notes (Signed)
[facility] Progress Note    Assessment/ Plan:   Pt is a 77 y.o. yo male who was admitted on 04/18/2018 with new start to HD- hosp complicated by worsening cardiac status/Afib, cath showing CAD s/p intervention- also poorly functioning AVF   1. Renal- new start to HD- now running MWF- has OP spot at [facility] MWF second shift upon D/C.  Access issues- bleeding from AVF with planned ligation tomorrow by Dr Scot Dock who feels that the fistula is not salvageable.  Now s/p temp cath per IR on 10/18  - VVS  Dr. Scot Dock to perform fistulogram tomorrow + conversion to [facility].  - Next HD today.  Has actually tolerated dialysis pretty well considering....  He wants to do it in a recliner on Monday  2. CAD/afib/CM- cath showing CAD- s/p stenting to LAD per cards - plavix 3. Anemia- CKD and ongoing bleeding - ESA and iron being given- drifting down- gave blood 10/23 with HD and 10/25 for hgb of 7.5--- is 9.3 today  4. Secondary hyperparathyroidism- PTH 63- I think was on vit D as OP, none here- phos now 4.4 no binder  5. HTN/volume- BP soft, amiodarone- not on any BP meds   Subjective:   Mild discomfort in the right arm which is swollen. O/w denies f/c/n/v/dyspnea   Objective:   BP (!) 148/58 (BP Location: Left Leg)   Pulse 67   Temp (!) 97.5 F (36.4 C) (Oral)   Resp 16   Ht 5\' 6"  (1.676 m)   Wt 73.6 kg   SpO2 100%   BMI 26.18 kg/m   Intake/Output Summary (Last 24 hours) at 05/05/2018 1135 Last data filed at 05/05/2018 0900 Gross per 24 hour  Intake 600 ml  Output 2 ml  Net 598 ml   Weight change: -5.826 kg  Physical Exam: General: alert, [place] Heart: brady Lungs: mostly clear Abdomen: soft, non tender Extremities: min edema Dialysis Access: right sided temp cath placed 10/18 -not bleeding- left AVF  can appreciate bruit - swelling on both arms R>>L  Imaging: No results found.  Labs: BMET Recent Labs  Lab 04/29/18 0424 04/30/18 0343 05/01/18 0340  05/02/18 0836  NA 137 138 136 138  K 3.8 4.3 3.8 3.4*  CL 101 102 101 102  CO2 26 24 27 26   GLUCOSE 84 86 96 117*  BUN 11 26* 17 38*  CREATININE 2.74* 4.29* 2.98* 4.95*  CALCIUM 7.7* 7.6* 7.6* 7.6*  PHOS 2.5 4.5 2.9 4.4   CBC Recent Labs  Lab 05/02/18 0339 05/03/18 0450 05/04/18 0752 05/05/18 0643  WBC 16.7* 14.8* 13.8* 13.7*  HGB 7.5* 8.6* 9.3* 9.6*  HCT 25.1* 28.5* 30.4* 30.7*  MCV 99.6 98.3 98.7 99.7  PLT 246 259 322 342    Medications:    . amiodarone  200 mg Oral Daily  . aspirin EC  81 mg Oral Daily  . atorvastatin  20 mg Oral QHS  . bacitracin-polymyxin b   Both Eyes QID  . Chlorhexidine Gluconate Cloth  6 each Topical Q0600  . Chlorhexidine Gluconate Cloth  6 each Topical Q0600  . clopidogrel  75 mg Oral Daily  . Darbepoetin Alfa  200 mcg Intravenous Q Fri-HD  . feeding supplement (NEPRO CARB STEADY)  237 mL Oral BID BM  . feeding supplement (PRO-STAT SUGAR FREE 64)  30 mL Oral Daily  . levothyroxine  50 mcg Oral QAC breakfast  . lidocaine-prilocaine   Topical Q M,W,F  . multivitamin  1 tablet Oral  QHS  . sodium chloride flush  3 mL Intravenous Q12H      Otelia Santee, MD 05/05/2018, 11:35 AM

## 2018-05-05 NOTE — Progress Notes (Signed)
PROGRESS NOTE    Troy [name]  STM:[id number] DOB: 12-26-1940 DOA: 04/18/2018 PCP: Gaynelle Arabian, MD    Brief Narrative:  77 y.o. 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 Also noted to have progressive renal failure with uremia for which nephrology was consulted and began hemodialysis 10/11 through remotely placed left RC AVF. An outpatient HD chair has been arranged.   Echocardiogram demonstrated worsening of LV dysfunction (EF 45-50% 2017 now 25-30%). Cardiology was consulted, recommending heart catheterization which the patient and family consented to after discussions with palliative care consultants. Left heart catheterization on 10/16 after vitamin K given for supratherapeutic INR showed LAD and RCA disease, though only LAD lesion was felt to be amenable to stenting. On 10/17 he developed erythema proximal to fistula site for which vancomycin and aztreonam were started empirically. Stenting to LAD was scheduled but postponed due to lack of dialysis access (fistula not able to be cannulated). IR placed dialysis catheter in right IJ for HD, though this has had recurrent bleeding issues for which dermabond and suturing were performed 10/20 with stability. Course further complicated by anemia due to bleeding and hematoma formation on the right arm (U/S ruled out clot). LHC planned 10/22 to be followed by fistulogram. [facility] SNF disposition planned.  Assessment & Plan:   Principal Problem:   Acute on chronic systolic heart failure (HCC) Active Problems:   CKD (chronic kidney disease), stage V (HCC)   ESRD (end stage  renal disease) (HCC)   Leukocytosis   Atypical chest pain   Paroxysmal A-fib (HCC)   NSTEMI (non-ST elevated myocardial infarction) [facility])   Myocardial infarction [facility])   Palliative care by specialist   DNR (do not resuscitate) discussion  NSTEMI: With hx abnormal stress testing Jan 2018 not followed by LHC due to CKD.  - LHC 04/23/2018 showed LAD and RCA lesions, plan PCI to LAD 10/22 (delayed due to fistula issues) with further plan based on clinical course (aggressive medical management vs. staged PCI to RCA). -Continued DAPT, statin, and antihypertensives/antianginals as BP will tolerate. Per Cardiology note, plan would by to continue DAPT x30 days without coumadin, then stop aspirin, continue plavix and restart coumadin -Remains stable this AM without chest pains. Cardiology has since signed off   Uncontrolled bleeding: Difficult situation in patient that requires heparin for confirmed NSTEMI. Qualitative platelet dysfunction likely contributing.  - Continued with platelet as per above as tolerated.  -Ordered and reviewed soft tissue US of RUE. Findings of small amount of fluid and subcutaneous edema -Had earlier discussed case with General Surgery who does not recommend surgical drainage -Vascular Surgery following. S/p compression tx 10/24 -Hgb stable and trending up  Chronic combined HFrEF: EF worsened to 25-30%.  - Manage volume with HD.  - Otherwise on guideline directed therapy as BP allows - Stable on HD  Sinus bradycardia: Does not seem to be symptomatic.  - BB remains on hold  ESRD: CLIP process completed, has spot at [facility] MWF at 12:15pm.  - Dialysis per nephrology last 10/21 - Planned fistulogram for this week per Vascular Surgery  AVF difficulty cannulation: Patent on U/S 10/20.  -  HD thru right IJ. - Vascular surgery consulted. Per vascular surgery, fistula likely not salvageable with plan for fisutla ligation 10/29 and replacement of temp HD cath  Anemia of  CKD and acute blood loss: s/p 1u PRBCs 10/11, 2u 10/12, 1 units 10/23  in setting of AVF bleeding and supratherapeutic INR, later had IJ cath site bleeding seems to have improved but now with significant hematoma/ecchymosis on right arm possibly related to blood draw. - Recheck CBC in AM -Hgb remains stable overnight - EPO per nephrology  Left arm cellulitis vs. fistula infection: WBC climbed 19 > 30, improved with abx - Initial blood cultures negative, repeat cultures neg - Given empiric vancomycin, aztreonam for total of 7 days  Right arm edema, hematoma, extravasation: No SVT or DVT on U/S. Unable to DC blood thinners - Elevate above the level of the heart -RUE soft tissue US ordered and reviewed. Small fluid collection noted -Earlier discussed with General Surgery who did not recommend drainage of lesion.  -will stop subq heparin and transition to SCD's for DVT prophylaxis -RUE continues to improve on exam  Acute toxic metabolic encephalopathy: Due to uremia.  -Mentation is at baseline at this time  Paroxysmal AFib:  - Continue amiodarone. Held BB secondary to bradycardia - Continued on anticoagulation per above per Cardiology - Patient remains rate controlled  Supratherapeutic INR: Resolved s/p vit K.  PVD, carotid stenosis: s/p CEA 2011.  -Will continue with ASA, statin as tolerated  Hypothyroidism: Chronic, stable.  - Continue synthroid 37mcg - Remains stable at this time  DVT prophylaxis: Heparin subq Code Status: Full Family Communication: Pt in room, family not at bedside Disposition Plan: Uncertain at this time  Consultants:   Cardiology  Nephrology  Vascular Surgery  Procedures:   [facility] 04/23/2018  Antimicrobials: Anti-infectives (From admission, onward)   Start     Dose/Rate Route Frequency Ordered Stop   05/06/18 0600  vancomycin (VANCOCIN) IVPB 1000 mg/200 mL premix     1,000 mg 200 mL/hr over 60 Minutes Intravenous On call to O.R. 05/05/18  1024 05/07/18 0559   05/01/18 1200  vancomycin (VANCOCIN) IVPB 750 mg/150 ml premix  Status:  Discontinued     750 mg 150 mL/hr over 60 Minutes Intravenous Every T-Th-Sa (Hemodialysis) 04/28/18 2115 04/29/18 1248   04/29/18 2200  aztreonam (AZACTAM) 0.5 g in dextrose 5 % 50 mL IVPB     0.5 g 100 mL/hr over 30 Minutes Intravenous Every 12 hours 04/29/18 1248 04/30/18 2223   04/29/18 1200  vancomycin (VANCOCIN) IVPB 750 mg/150 ml premix  Status:  Discontinued     750 mg 150 mL/hr over 60 Minutes Intravenous Every T-Th-Sa (Hemodialysis) 04/27/18 1037 04/28/18 2115   04/28/18 2200  vancomycin (VANCOCIN) IVPB 750 mg/150 ml premix     750 mg 150 mL/hr over 60 Minutes Intravenous  Once 04/28/18 2114 04/29/18 0034   04/28/18 1200  vancomycin (VANCOCIN) IVPB 750 mg/150 ml premix  Status:  Discontinued     750 mg 150 mL/hr over 60 Minutes Intravenous Every M-W-F (Hemodialysis) 04/25/18 1007 04/27/18 1037   04/26/18 1731  Vancomycin (VANCOCIN) 750-5 MG/150ML-% IVPB    Note to Pharmacy:  Alex Gardener   : cabinet override      04/26/18 1731 04/27/18 0544   04/26/18 1200  vancomycin (VANCOCIN) IVPB 750 mg/150 ml premix     750 mg 150 mL/hr over 60 Minutes Intravenous Every Sat (Hemodialysis) 04/26/18 1049 04/26/18 1900   04/25/18 1200  vancomycin (VANCOCIN) IVPB 750  mg/150 ml premix  Status:  Discontinued     750 mg 150 mL/hr over 60 Minutes Intravenous Every M-W-F (Hemodialysis) 04/24/18 0545 04/25/18 1007   04/24/18 0630  aztreonam (AZACTAM) 0.5 g in dextrose 5 % 50 mL IVPB  Status:  Discontinued     0.5 g 100 mL/hr over 30 Minutes Intravenous Every 12 hours 04/24/18 0552 04/29/18 1248   04/24/18 0600  vancomycin (VANCOCIN) 1,250 mg in sodium chloride 0.9 % 250 mL IVPB     1,250 mg 166.7 mL/hr over 90 Minutes Intravenous  Once 04/24/18 0545 04/24/18 0808      Subjective: No complaints at this time. Eager for discharge  Objective: Vitals:   05/05/18 1330 05/05/18 1400 05/05/18 1430  05/05/18 1500  BP: (!) 104/27 (!) 111/35 (!) 111/14 (!) 106/26  Pulse: 61 63 64 67  Resp:      Temp:      TempSrc:      SpO2:      Weight:      Height:        Intake/Output Summary (Last 24 hours) at 05/05/2018 1630 Last data filed at 05/05/2018 1300 Gross per 24 hour  Intake 360 ml  Output -  Net 360 ml   Filed Weights   05/02/18 1216 05/04/18 0500 05/05/18 0551  Weight: 73.5 kg 79.4 kg 73.6 kg    Examination: General exam: Awake, laying in bed, in nad Respiratory system: Normal respiratory effort, no wheezing Cardiovascular system: regular rate, s1, s2 Gastrointestinal system: Soft, nondistended, positive BS Central nervous system: CN2-12 grossly intact, strength intact Extremities: Perfused, no clubbing, RUE edema, improving Skin: Normal skin turgor, L fistula site with dark smooth superficial nodule Psychiatry: Mood normal // no visual hallucinations   Data Reviewed: I have personally reviewed following labs and imaging studies  CBC: Recent Labs  Lab 05/01/18 0340 05/02/18 0339 05/03/18 0450 05/04/18 0752 05/05/18 0643  WBC 14.7* 16.7* 14.8* 13.8* 13.7*  HGB 8.1* 7.5* 8.6* 9.3* 9.6*  HCT 25.9* 25.1* 28.5* 30.4* 30.7*  MCV 97.7 99.6 98.3 98.7 99.7  PLT 228 246 259 322 956   Basic Metabolic Panel: Recent Labs  Lab 04/29/18 0424 04/30/18 0343 05/01/18 0340 05/02/18 0836 05/05/18 1303  NA 137 138 136 138 140  K 3.8 4.3 3.8 3.4* 3.6  CL 101 102 101 102 104  CO2 26 24 27 26 27   GLUCOSE 84 86 96 117* 120*  BUN 11 26* 17 38* 56*  CREATININE 2.74* 4.29* 2.98* 4.95* 6.11*  CALCIUM 7.7* 7.6* 7.6* 7.6* 8.1*  PHOS 2.5 4.5 2.9 4.4 3.3   GFR: Estimated Creatinine Clearance: 9.1 mL/min (A) (by C-G formula based on SCr of 6.11 mg/dL (H)). Liver Function Tests: Recent Labs  Lab 04/29/18 0424 04/30/18 0343 05/01/18 0340 05/02/18 0836 05/05/18 1303  ALBUMIN 2.7* 2.5* 2.3* 2.4* 2.3*   No results for input(s): LIPASE, AMYLASE in the last 168 hours. No  results for input(s): AMMONIA in the last 168 hours. Coagulation Profile: Recent Labs  Lab 05/05/18 1224  INR 1.25   Cardiac Enzymes: No results for input(s): CKTOTAL, CKMB, CKMBINDEX, TROPONINI in the last 168 hours. BNP (last 3 results) No results for input(s): PROBNP in the last 8760 hours. HbA1C: No results for input(s): HGBA1C in the last 72 hours. CBG: Recent Labs  Lab 04/29/18 1905 04/30/18 1235  GLUCAP 102* 76   Lipid Profile: No results for input(s): CHOL, HDL, LDLCALC, TRIG, CHOLHDL, LDLDIRECT in the last 72 hours. Thyroid Function Tests: No results  for input(s): TSH, T4TOTAL, FREET4, T3FREE, THYROIDAB in the last 72 hours. Anemia Panel: No results for input(s): VITAMINB12, FOLATE, FERRITIN, TIBC, IRON, RETICCTPCT in the last 72 hours. Sepsis Labs: No results for input(s): PROCALCITON, LATICACIDVEN in the last 168 hours.  No results found for this or any previous visit (from the past 240 hour(s)).   Radiology Studies: No results found.  Scheduled Meds: . amiodarone  200 mg Oral Daily  . aspirin EC  81 mg Oral Daily  . atorvastatin  20 mg Oral QHS  . bacitracin-polymyxin b   Both Eyes QID  . Chlorhexidine Gluconate Cloth  6 each Topical Q0600  . Chlorhexidine Gluconate Cloth  6 each Topical Q0600  . clopidogrel  75 mg Oral Daily  . Darbepoetin Alfa  200 mcg Intravenous Q Fri-HD  . feeding supplement (NEPRO CARB STEADY)  237 mL Oral BID BM  . feeding supplement (PRO-STAT SUGAR FREE 64)  30 mL Oral Daily  . levothyroxine  50 mcg Oral QAC breakfast  . lidocaine-prilocaine   Topical Q M,W,F  . morphine      . multivitamin  1 tablet Oral QHS  . sodium chloride flush  3 mL Intravenous Q12H   Continuous Infusions: . sodium chloride    . [START ON 05/06/2018] vancomycin       LOS: 17 days   Marylu Lund, MD [facility] Pager On Amion  If 7PM-7AM, please contact night-coverage 05/05/2018, 4:30 PM

## 2018-05-05 NOTE — Plan of Care (Signed)
Pt resting quietly in bed this shift, no acute issues overnight.

## 2018-05-05 NOTE — Progress Notes (Signed)
Physical Therapy Treatment Patient Details Name: Troy [name] MRN: [id number] DOB: [birth year] Today's Date: 05/05/2018    History of Present Illness Patient is a 77 y.o. male admitted 04/18/18 with chest pain. Found to have NSTEMI, uremia and new ESRD; started on HD. Pt with severe R CAD and significant LAD disease. IR placed RIJ HD cath, though this has had recurrent bleeding issues s/p suturing 10/20 for stability. Course further complicated by anemia due to bleeding and hematoma formation on RUE (U/S ruled out clot). S/p LHC 10/22. PMH includes stroke, PAF, HTN, CKD, CHF.    PT Comments    Patient progressing well towards PT goals. Improved ambulation distance with Min guard assist for balance/safety. Continues to require seated rest breaks due to fatigue and SOB. HR stable 70-90s bpm throughout. Tolerated LE functional strengthening. Pt eager to do HD in recliner instead of the bed. Frequency increased to 3x/week as pt aiming to try to return home pending further improvement in mobility. Will follow.    Follow Up Recommendations  SNF;Supervision for mobility/OOB     Equipment Recommendations  None recommended by PT    Recommendations for Other Services       Precautions / Restrictions Precautions Precautions: Fall Restrictions Weight Bearing Restrictions: No    Mobility  Bed Mobility               General bed mobility comments: Up in chair upon PT arrival.   Transfers Overall transfer level: Needs assistance Equipment used: Rolling walker (2 wheeled) Transfers: Sit to/from Stand   Stand pivot transfers: Min guard       General transfer comment: Min guard for safety. Stood from chair x10 using UEs. Cues to reach back for chair and to lower slowly to descend.  Ambulation/Gait Ambulation/Gait assistance: Min guard Gait Distance (Feet): 100 Feet(x2 bouts) Assistive device: Rolling walker (2 wheeled) Gait Pattern/deviations: Step-through pattern;Decreased  stride length;Trunk flexed;Antalgic Gait velocity: Decreased   General Gait Details: Slow, mildly unsteady gait with forward flexed posture and min guard for balance. Intermittent cues for upright posture and to maintain closer proximity to RW. 1 seated rest break. HR ranged from 70-90s bpm.   Stairs             Wheelchair Mobility    Modified Rankin (Stroke Patients Only)       Balance Overall balance assessment: Needs assistance Sitting-balance support: Feet supported;Single extremity supported Sitting balance-Leahy Scale: Fair     Standing balance support: During functional activity;Bilateral upper extremity supported Standing balance-Leahy Scale: Poor Standing balance comment: Reliant on UE support                            Cognition Arousal/Alertness: Awake/alert Behavior During Therapy: WFL for tasks assessed/performed Overall Cognitive Status: Within Functional Limits for tasks assessed                                        Exercises Other Exercises Other Exercises: Serial sit to stands x10- needs UE to push off    General Comments        Pertinent Vitals/Pain Pain Assessment: Faces Faces Pain Scale: Hurts even more Pain Location: BUEs Pain Descriptors / Indicators: Sore Pain Intervention(s): Monitored during session;Repositioned    Home Living  Prior Function            PT Goals (current goals can now be found in the care plan section) Progress towards PT goals: Progressing toward goals    Frequency    Min 3X/week      PT Plan Current plan remains appropriate;Frequency needs to be updated    Co-evaluation              AM-PAC PT "6 Clicks" Daily Activity  Outcome Measure  Difficulty turning over in bed (including adjusting bedclothes, sheets and blankets)?: A Little Difficulty moving from lying on back to sitting on the side of the bed? : Unable Difficulty sitting  down on and standing up from a chair with arms (e.g., wheelchair, bedside commode, etc,.)?: None Help needed moving to and from a bed to chair (including a wheelchair)?: A Little Help needed walking in hospital room?: A Little Help needed climbing 3-5 steps with a railing? : A Lot 6 Click Score: 16    End of Session Equipment Utilized During Treatment: Gait belt Activity Tolerance: Patient tolerated treatment well Patient left: in chair;with call bell/phone within reach Nurse Communication: Mobility status PT Visit Diagnosis: Muscle weakness (generalized) (M62.81);Difficulty in walking, not elsewhere classified (R26.2);Unsteadiness on feet (R26.81)     Time: 9381-0175 PT Time Calculation (min) (ACUTE ONLY): 26 min  Charges:  $Gait Training: 8-22 mins                     Wray Kearns, PT, DPT Acute Rehabilitation Services Pager [phone] Office [place] 05/05/2018, 11:59 AM

## 2018-05-06 ENCOUNTER — Inpatient Hospital Stay (HOSPITAL_COMMUNITY): Payer: Medicare Other | Admitting: Certified Registered Nurse Anesthetist

## 2018-05-06 ENCOUNTER — Encounter (HOSPITAL_COMMUNITY): Payer: Self-pay

## 2018-05-06 ENCOUNTER — Telehealth: Payer: Self-pay | Admitting: Vascular Surgery

## 2018-05-06 ENCOUNTER — Inpatient Hospital Stay (HOSPITAL_COMMUNITY): Payer: Medicare Other

## 2018-05-06 ENCOUNTER — Encounter (HOSPITAL_COMMUNITY)
Admission: EM | Disposition: A | Discharge status: Skilled Nursing Facility | Payer: Self-pay | Source: Home / Self Care | Attending: Internal Medicine

## 2018-05-06 HISTORY — PX: INSERTION OF DIALYSIS CATHETER: SHX1324

## 2018-05-06 HISTORY — PX: LIGATION OF ARTERIOVENOUS  FISTULA: SHX5948

## 2018-05-06 LAB — BASIC METABOLIC PANEL
Anion gap: 9 (ref 5–15)
BUN: 24 mg/dL — AB (ref 8–23)
CHLORIDE: 103 mmol/L (ref 98–111)
CO2: 26 mmol/L (ref 22–32)
Calcium: 7.8 mg/dL — ABNORMAL LOW (ref 8.9–10.3)
Creatinine, Ser: 3.65 mg/dL — ABNORMAL HIGH (ref 0.61–1.24)
GFR calc Af Amer: 17 mL/min — ABNORMAL LOW (ref >60)
GFR calc non Af Amer: 15 mL/min — ABNORMAL LOW (ref >60)
GLUCOSE: 94 mg/dL (ref 70–99)
POTASSIUM: 3.5 mmol/L (ref 3.5–5.1)
Sodium: 138 mmol/L (ref 135–145)

## 2018-05-06 LAB — CBC
HEMATOCRIT: 29.6 % — AB (ref 39.0–52.0)
HEMOGLOBIN: 8.9 g/dL — AB (ref 13.0–17.0)
MCH: 30.1 pg (ref 26.0–34.0)
MCHC: 30.1 g/dL (ref 30.0–36.0)
MCV: 100 fL (ref 80.0–100.0)
Platelets: 366 10*3/uL (ref 150–400)
RBC: 2.96 MIL/uL — ABNORMAL LOW (ref 4.22–5.81)
RDW: 19.7 % — ABNORMAL HIGH (ref 11.5–15.5)
WBC: 13.7 10*3/uL — ABNORMAL HIGH (ref 4.0–10.5)
nRBC: 0.1 % (ref 0.0–0.2)

## 2018-05-06 LAB — PROTIME-INR
INR: 1.31
Prothrombin Time: 16.1 seconds — ABNORMAL HIGH (ref 11.4–15.2)

## 2018-05-06 IMAGING — DX DG CHEST 1V PORT
1 series · 1 of 1 positions shown · non-contrast
Comparison: None.

CLINICAL DATA: Dialysis catheter insertion

EXAM:
PORTABLE CHEST 1 VIEW

[chest ap]
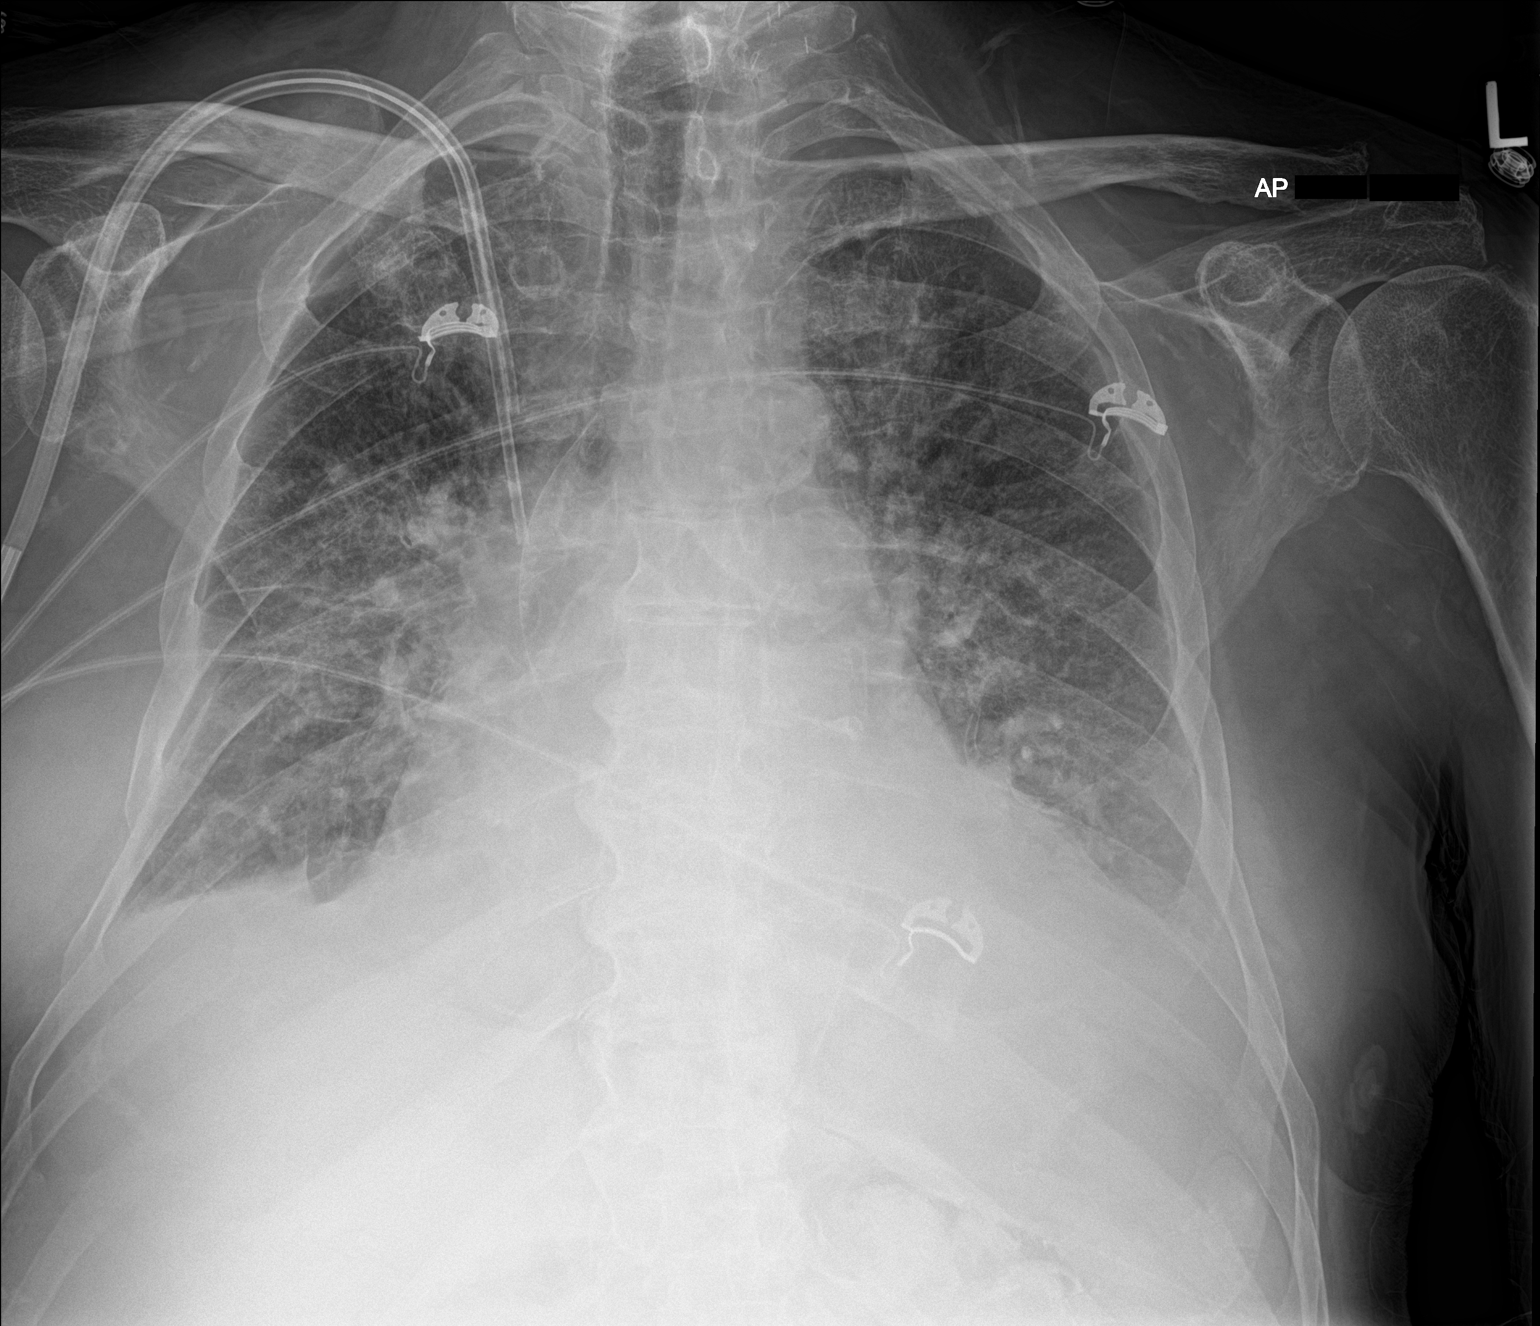

[1 of 1 positions shown; findings below may reference images not displayed]

FINDINGS: Right IJ dual lumen dialysis catheter is noted with tips in the the
mid as well as distal SVC. Cardiomegaly with interstitial edema and
small bilateral pleural effusions. No pneumothorax. Ectatic
atherosclerotic aorta. No acute osseous abnormality.
IMPRESSION: 1. Cardiomegaly with aortic atherosclerosis. Interstitial edema with
small left greater than right pleural effusions.
2. Dialysis catheter from right IJ approach with tips in the mid and
distal SVC respectively. No pneumothorax.

## 2018-05-06 SURGERY — LIGATION OF ARTERIOVENOUS  FISTULA
Anesthesia: General | Site: Neck | Laterality: Right

## 2018-05-06 MED ORDER — SODIUM CHLORIDE 0.9 % IV SOLN
INTRAVENOUS | Status: DC | PRN
  Administered 2018-05-06: 13:00:00 via INTRAVENOUS
  Administered 2018-05-06: 25 ug/min via INTRAVENOUS

## 2018-05-06 MED ORDER — EPHEDRINE SULFATE 50 MG/ML IJ SOLN
INTRAMUSCULAR | Status: DC | PRN
  Administered 2018-05-06 (×4): 5 mg via INTRAVENOUS
  Administered 2018-05-06: 10 mg via INTRAVENOUS

## 2018-05-06 MED ORDER — PHENYLEPHRINE HCL 10 MG/ML IJ SOLN
INTRAMUSCULAR | Status: DC | PRN
  Administered 2018-05-06 (×2): 40 ug via INTRAVENOUS
  Administered 2018-05-06: 80 ug via INTRAVENOUS

## 2018-05-06 MED ORDER — FENTANYL CITRATE (PF) 100 MCG/2ML IJ SOLN
INTRAMUSCULAR | Status: AC
  Filled 2018-05-06: qty 2

## 2018-05-06 MED ORDER — LIDOCAINE 2% (20 MG/ML) 5 ML SYRINGE
INTRAMUSCULAR | Status: DC | PRN
  Administered 2018-05-06: 100 mg via INTRAVENOUS

## 2018-05-06 MED ORDER — PROPOFOL 10 MG/ML IV BOLUS
INTRAVENOUS | Status: AC
  Filled 2018-05-06: qty 20

## 2018-05-06 MED ORDER — OXYCODONE HCL 5 MG PO TABS: 5.0000 mg | ORAL_TABLET | Freq: Once | ORAL | Status: DC | PRN

## 2018-05-06 MED ORDER — 0.9 % SODIUM CHLORIDE (POUR BTL) OPTIME
TOPICAL | Status: DC | PRN
  Administered 2018-05-06: 1000 mL

## 2018-05-06 MED ORDER — OXYCODONE HCL 5 MG/5ML PO SOLN: 5.0000 mg | Freq: Once | ORAL | Status: DC | PRN

## 2018-05-06 MED ORDER — LIDOCAINE 2% (20 MG/ML) 5 ML SYRINGE
INTRAMUSCULAR | Status: AC
  Filled 2018-05-06: qty 5

## 2018-05-06 MED ORDER — ONDANSETRON HCL 4 MG/2ML IJ SOLN
INTRAMUSCULAR | Status: AC
  Filled 2018-05-06: qty 2

## 2018-05-06 MED ORDER — DEXAMETHASONE SODIUM PHOSPHATE 10 MG/ML IJ SOLN
INTRAMUSCULAR | Status: DC | PRN
  Administered 2018-05-06: 5 mg via INTRAVENOUS

## 2018-05-06 MED ORDER — FENTANYL CITRATE (PF) 250 MCG/5ML IJ SOLN
INTRAMUSCULAR | Status: DC | PRN
  Administered 2018-05-06 (×2): 25 ug via INTRAVENOUS

## 2018-05-06 MED ORDER — ONDANSETRON HCL 4 MG/2ML IJ SOLN: 4.0000 mg | Freq: Once | INTRAMUSCULAR | Status: DC | PRN

## 2018-05-06 MED ORDER — EPHEDRINE 5 MG/ML INJ
INTRAVENOUS | Status: AC
  Filled 2018-05-06: qty 10

## 2018-05-06 MED ORDER — VASOPRESSIN 20 UNIT/ML IV SOLN
INTRAVENOUS | Status: DC | PRN
  Administered 2018-05-06 (×2): 2 [IU] via INTRAVENOUS
  Administered 2018-05-06: 1 [IU] via INTRAVENOUS

## 2018-05-06 MED ORDER — FENTANYL CITRATE (PF) 100 MCG/2ML IJ SOLN
25.0000 ug | INTRAMUSCULAR | Status: DC | PRN
  Administered 2018-05-06: 50 ug via INTRAVENOUS

## 2018-05-06 MED ORDER — HEPARIN SODIUM (PORCINE) 1000 UNIT/ML IJ SOLN
INTRAMUSCULAR | Status: AC
  Filled 2018-05-06: qty 1

## 2018-05-06 MED ORDER — SODIUM CHLORIDE 0.9 % IV SOLN: INTRAVENOUS | Status: DC | PRN

## 2018-05-06 MED ORDER — SODIUM CHLORIDE 0.9 % IV SOLN
INTRAVENOUS | Status: DC
  Administered 2018-05-06 (×2): via INTRAVENOUS

## 2018-05-06 MED ORDER — CLOPIDOGREL BISULFATE 75 MG PO TABS
75.0000 mg | ORAL_TABLET | Freq: Every day | ORAL | Status: DC
  Administered 2018-05-07 – 2018-05-08 (×2): 75 mg via ORAL
  Filled 2018-05-06 (×2): qty 1

## 2018-05-06 MED ORDER — FENTANYL CITRATE (PF) 250 MCG/5ML IJ SOLN
INTRAMUSCULAR | Status: AC
  Filled 2018-05-06: qty 5

## 2018-05-06 MED ORDER — DEXAMETHASONE SODIUM PHOSPHATE 10 MG/ML IJ SOLN
INTRAMUSCULAR | Status: AC
  Filled 2018-05-06: qty 1

## 2018-05-06 MED ORDER — ONDANSETRON HCL 4 MG/2ML IJ SOLN
INTRAMUSCULAR | Status: DC | PRN
  Administered 2018-05-06: 4 mg via INTRAVENOUS

## 2018-05-06 MED ORDER — PROPOFOL 10 MG/ML IV BOLUS
INTRAVENOUS | Status: DC | PRN
  Administered 2018-05-06: 50 mg via INTRAVENOUS

## 2018-05-06 MED ORDER — HEPARIN SODIUM (PORCINE) 1000 UNIT/ML IJ SOLN
INTRAMUSCULAR | Status: DC | PRN
  Administered 2018-05-06: 7000 [IU] via INTRAVENOUS

## 2018-05-06 MED ORDER — SODIUM CHLORIDE 0.9 % IV SOLN
INTRAVENOUS | Status: AC
  Filled 2018-05-06: qty 1.2

## 2018-05-06 MED ORDER — SODIUM CHLORIDE 0.9 % IV SOLN
INTRAVENOUS | Status: DC | PRN
  Administered 2018-05-06: 500 mL

## 2018-05-06 MED ORDER — PROTAMINE SULFATE 10 MG/ML IV SOLN
INTRAVENOUS | Status: DC | PRN
  Administered 2018-05-06: 40 mg via INTRAVENOUS

## 2018-05-06 MED ORDER — HEPARIN SODIUM (PORCINE) 1000 UNIT/ML IJ SOLN
INTRAMUSCULAR | Status: DC | PRN
  Administered 2018-05-06: 4000 [IU] via INTRAVENOUS

## 2018-05-06 MED ORDER — BACITRACIN ZINC 500 UNIT/GM EX OINT
TOPICAL_OINTMENT | CUTANEOUS | Status: AC
  Filled 2018-05-06: qty 28.35

## 2018-05-06 MED ORDER — PHENYLEPHRINE 40 MCG/ML (10ML) SYRINGE FOR IV PUSH (FOR BLOOD PRESSURE SUPPORT): PREFILLED_SYRINGE | INTRAVENOUS | Status: DC | PRN

## 2018-05-06 SURGICAL SUPPLY — 74 items
ADH SKN CLS APL DERMABOND .7 (GAUZE/BANDAGES/DRESSINGS) ×3
ADH SKN CLS LQ APL DERMABOND (GAUZE/BANDAGES/DRESSINGS) ×3
ARMBAND PINK RESTRICT EXTREMIT (MISCELLANEOUS) ×8 IMPLANT
BAG DECANTER FOR FLEXI CONT (MISCELLANEOUS) ×4 IMPLANT
BANDAGE ELASTIC 4 VELCRO ST LF (GAUZE/BANDAGES/DRESSINGS) ×2 IMPLANT
BIOPATCH RED 1 DISK 7.0 (GAUZE/BANDAGES/DRESSINGS) ×4 IMPLANT
BNDG GAUZE ELAST 4 BULKY (GAUZE/BANDAGES/DRESSINGS) ×2 IMPLANT
CANISTER SUCT 3000ML PPV (MISCELLANEOUS) ×4 IMPLANT
CANNULA VESSEL 3MM 2 BLNT TIP (CANNULA) ×4 IMPLANT
CATH CANNON HEMO 15FR 19 (HEMODIALYSIS SUPPLIES) ×2 IMPLANT
CATH PALINDROME RT-P 15FX19CM (CATHETERS) ×2 IMPLANT
CATH PALINDROME RT-P 15FX23CM (CATHETERS) IMPLANT
CATH PALINDROME RT-P 15FX28CM (CATHETERS) IMPLANT
CATH PALINDROME RT-P 15FX55CM (CATHETERS) IMPLANT
CHLORAPREP W/TINT 26ML (MISCELLANEOUS) ×4 IMPLANT
CLIP VESOCCLUDE MED 6/CT (CLIP) ×4 IMPLANT
CLIP VESOCCLUDE SM WIDE 6/CT (CLIP) ×6 IMPLANT
COVER PROBE W GEL 5X96 (DRAPES) IMPLANT
COVER SURGICAL LIGHT HANDLE (MISCELLANEOUS) ×6 IMPLANT
COVER WAND RF STERILE (DRAPES) ×4 IMPLANT
DECANTER SPIKE VIAL GLASS SM (MISCELLANEOUS) ×4 IMPLANT
DERMABOND ADHESIVE PROPEN (GAUZE/BANDAGES/DRESSINGS) ×1
DERMABOND ADVANCED (GAUZE/BANDAGES/DRESSINGS) ×1
DERMABOND ADVANCED .7 DNX12 (GAUZE/BANDAGES/DRESSINGS) ×3 IMPLANT
DERMABOND ADVANCED .7 DNX6 (GAUZE/BANDAGES/DRESSINGS) ×1 IMPLANT
DRAPE C-ARM 42X72 X-RAY (DRAPES) ×4 IMPLANT
DRAPE CHEST BREAST 15X10 FENES (DRAPES) ×4 IMPLANT
DRSG OPSITE 4X5.5 SM (GAUZE/BANDAGES/DRESSINGS) ×2 IMPLANT
ELECT REM PT RETURN 9FT ADLT (ELECTROSURGICAL) ×4
ELECTRODE REM PT RTRN 9FT ADLT (ELECTROSURGICAL) ×3 IMPLANT
GAUZE 4X4 16PLY RFD (DISPOSABLE) ×4 IMPLANT
GAUZE SPONGE 2X2 8PLY STRL LF (GAUZE/BANDAGES/DRESSINGS) ×1 IMPLANT
GAUZE SPONGE 4X4 12PLY STRL (GAUZE/BANDAGES/DRESSINGS) ×2 IMPLANT
GAUZE SPONGE 4X4 16PLY XRAY LF (GAUZE/BANDAGES/DRESSINGS) ×2 IMPLANT
GLOVE BIO SURGEON STRL SZ 6 (GLOVE) ×2 IMPLANT
GLOVE BIO SURGEON STRL SZ 6.5 (GLOVE) ×4 IMPLANT
GLOVE BIO SURGEON STRL SZ7.5 (GLOVE) ×8 IMPLANT
GLOVE BIOGEL PI IND STRL 6 (GLOVE) ×1 IMPLANT
GLOVE BIOGEL PI IND STRL 7.0 (GLOVE) ×1 IMPLANT
GLOVE BIOGEL PI IND STRL 8 (GLOVE) ×5 IMPLANT
GLOVE BIOGEL PI INDICATOR 6 (GLOVE) ×1
GLOVE BIOGEL PI INDICATOR 7.0 (GLOVE) ×1
GLOVE BIOGEL PI INDICATOR 8 (GLOVE) ×3
GOWN STRL REUS W/ TWL LRG LVL3 (GOWN DISPOSABLE) ×9 IMPLANT
GOWN STRL REUS W/TWL LRG LVL3 (GOWN DISPOSABLE) ×12
KIT BASIN OR (CUSTOM PROCEDURE TRAY) ×4 IMPLANT
KIT TURNOVER KIT B (KITS) ×4 IMPLANT
NDL 18GX1X1/2 (RX/OR ONLY) (NEEDLE) ×2 IMPLANT
NDL HYPO 25GX1X1/2 BEV (NEEDLE) ×2 IMPLANT
NEEDLE 18GX1X1/2 (RX/OR ONLY) (NEEDLE) ×4 IMPLANT
NEEDLE HYPO 25GX1X1/2 BEV (NEEDLE) ×4 IMPLANT
NS IRRIG 1000ML POUR BTL (IV SOLUTION) ×4 IMPLANT
PACK CV ACCESS (CUSTOM PROCEDURE TRAY) ×4 IMPLANT
PACK SURGICAL SETUP 50X90 (CUSTOM PROCEDURE TRAY) ×4 IMPLANT
PAD ARMBOARD 7.5X6 YLW CONV (MISCELLANEOUS) ×8 IMPLANT
PATCH VASC XENOSURE 1CMX6CM (Vascular Products) ×4 IMPLANT
PATCH VASC XENOSURE 1X6 (Vascular Products) ×1 IMPLANT
SPONGE GAUZE 2X2 STER 10/PKG (GAUZE/BANDAGES/DRESSINGS) ×1
SPONGE SURGIFOAM ABS GEL 100 (HEMOSTASIS) IMPLANT
SUT ETHILON 3 0 PS 1 (SUTURE) ×12 IMPLANT
SUT PROLENE 6 0 BV (SUTURE) ×6 IMPLANT
SUT SILK 0 TIES 10X30 (SUTURE) ×4 IMPLANT
SUT VIC AB 3-0 SH 27 (SUTURE) ×4
SUT VIC AB 3-0 SH 27X BRD (SUTURE) ×3 IMPLANT
SUT VIC AB 4-0 PS2 18 (SUTURE) ×2 IMPLANT
SUT VICRYL 4-0 PS2 18IN ABS (SUTURE) ×8 IMPLANT
SYR 10ML LL (SYRINGE) ×4 IMPLANT
SYR 20CC LL (SYRINGE) ×8 IMPLANT
SYR 5ML LL (SYRINGE) ×8 IMPLANT
SYR CONTROL 10ML LL (SYRINGE) ×4 IMPLANT
TOWEL GREEN STERILE (TOWEL DISPOSABLE) ×8 IMPLANT
TOWEL GREEN STERILE FF (TOWEL DISPOSABLE) ×4 IMPLANT
UNDERPAD 30X30 (UNDERPADS AND DIAPERS) ×4 IMPLANT
WATER STERILE IRR 1000ML POUR (IV SOLUTION) ×4 IMPLANT

## 2018-05-06 NOTE — Anesthesia Procedure Notes (Addendum)
Procedure Name: LMA Insertion Date/Time: 05/06/2018 11:14 AM Performed by: Bryson Corona, CRNA Pre-anesthesia Checklist: Patient identified, Emergency Drugs available, Suction available and Patient being monitored Patient Re-evaluated:Patient Re-evaluated prior to induction Oxygen Delivery Method: Circle system utilized Preoxygenation: Pre-oxygenation with 100% oxygen Induction Type: IV induction Ventilation: Mask ventilation without difficulty LMA: LMA with gastric port inserted LMA Size: 4.0 Number of attempts: 1 Airway Equipment and Method: Bite block Placement Confirmation: positive ETCO2 Tube secured with: Tape Dental Injury: Teeth and Oropharynx as per pre-operative assessment

## 2018-05-06 NOTE — Progress Notes (Signed)
Dr. Scot Dock was able to revise the left radial cephalic fistula.  Use tunneled dialysis catheter and do not stick for 8 weeks.    Will have pt return to clinic in a couple of weeks for suture removal and duplex of fistula.  Leontine Locket, [facility] 05/06/2018 1:19 PM

## 2018-05-06 NOTE — Progress Notes (Signed)
[facility] Progress Note    Assessment/ Plan:   Pt is a82 y.o.yo malewho was admitted on 10/11/2019with new start to HD- hosp complicated by worsening cardiac status/Afib, cath showing CAD s/pintervention- also poorly functioning AVF   1. Renal- new start to HD- now running MWF- has OP spot at [facility] MWF second shift upon D/C. Access issues- bleeding from AVF with planned ligation today by Dr Scot Dock who feels that the fistula is not salvageable + conversion of the  temp cath (IR on 10/18) to TC.   - Next HD tomorrow. Has actually tolerated dialysis pretty well considering.  2. CAD/afib/CM-cath showing CAD- s/p stenting to LAD per cards - plavix 3. Anemia- CKD and ongoing bleeding - ESA and iron being given- drifting down- gave blood 10/23 with HD and 10/25 for hgb of 7.5--- is now9.3.  4. Secondary hyperparathyroidism- PTH 63- I think was on vit D as OP, none here- phos now 4.4 no binder  5. HTN/volume- BP soft, amiodarone- not on any BP meds  Subjective:   Mild discomfort in the right arm which is swollen. O/w denies f/c/n/v/dyspnea Slept very well last night.   Objective:   BP (!) 164/49 (BP Location: Right Arm)   Pulse 63   Temp 98.1 F (36.7 C) (Oral)   Resp 16   Ht 5\' 6"  (1.676 m)   Wt 72.4 kg   SpO2 99%   BMI 25.76 kg/m   Intake/Output Summary (Last 24 hours) at 05/06/2018 0746 Last data filed at 05/05/2018 2230 Gross per 24 hour  Intake 480 ml  Output 1581 ml  Net -1101 ml   Weight change: -1.179 kg  Physical Exam: General: alert, pleasant Heart: brady Lungs: mostly clear Abdomen: soft, non tender Extremities: min edema Dialysis Access: right sided temp cath placed 10/18 -not bleeding- left AVF can appreciate bruit, pulsatile at juxta segment - swelling on both armsR>>L  Imaging: No results found.  Labs: BMET Recent Labs  Lab 04/30/18 0343 05/01/18 0340 05/02/18 0836 05/05/18 1303 05/06/18 0334  NA 138 136 138 140 138   K 4.3 3.8 3.4* 3.6 3.5  CL 102 101 102 104 103  CO2 24 27 26 27 26   GLUCOSE 86 96 117* 120* 94  BUN 26* 17 38* 56* 24*  CREATININE 4.29* 2.98* 4.95* 6.11* 3.65*  CALCIUM 7.6* 7.6* 7.6* 8.1* 7.8*  PHOS 4.5 2.9 4.4 3.3  --    CBC Recent Labs  Lab 05/03/18 0450 05/04/18 0752 05/05/18 0643 05/06/18 0334  WBC 14.8* 13.8* 13.7* 13.7*  HGB 8.6* 9.3* 9.6* 8.9*  HCT 28.5* 30.4* 30.7* 29.6*  MCV 98.3 98.7 99.7 100.0  PLT 259 322 342 366    Medications:    . amiodarone  200 mg Oral Daily  . aspirin EC  81 mg Oral Daily  . atorvastatin  20 mg Oral QHS  . bacitracin-polymyxin b   Both Eyes QID  . Chlorhexidine Gluconate Cloth  6 each Topical Q0600  . Chlorhexidine Gluconate Cloth  6 each Topical Q0600  . clopidogrel  75 mg Oral Daily  . Darbepoetin Alfa  200 mcg Intravenous Q Fri-HD  . feeding supplement (NEPRO CARB STEADY)  237 mL Oral BID BM  . feeding supplement (PRO-STAT SUGAR FREE 64)  30 mL Oral Daily  . levothyroxine  50 mcg Oral QAC breakfast  . lidocaine-prilocaine   Topical Q M,W,F  . multivitamin  1 tablet Oral QHS  . sodium chloride flush  3 mL Intravenous Q12H  Otelia Santee, MD 05/06/2018, 7:46 AM

## 2018-05-06 NOTE — Progress Notes (Signed)
CSW following for disposition planing. Per MD, patient likely ready after HD tomorrow. CSW awaiting new [facility] authorization for [employer]. Auth is needed before patient can admit to SNF. CSW to follow and support.  Estanislado Emms, [place]

## 2018-05-06 NOTE — Op Note (Signed)
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 Scot Dock, MD, FACS  ASSIST: Leontine Locket, PA  ANESTHESIA: General  EBL: Minimal  INDICATIONS:    Troy [name] is a 77 y.o. male has a left radiocephalic fistula.  He developed a small pseudoaneurysm overlying the fistula with ulceration that I felt this was at risk for bleeding.  I recommended excision of this possible ligation of the fistula and exchange of his temporary catheter for permanent catheter.  If the fistula could not be salvaged we were considering placing new access.  FINDINGS:   The pseudoaneurysm was addressed and there was an area of stenosis identified by exam in the distal forearm which I felt we could potentially revise.  This was treated with a bovine pericardial patch angioplasty with an excellent result.  TECHNIQUE:   The patient was taken to the operating room and received a general anesthetic.  The neck and upper chest and the right IJ temporary catheter were prepped and draped into the field.  The catheter was clamped and then divided.  A guidewire was introduced into the right atrium.  The old catheter was removed.  The dilator and peel-away sheath were advanced over the wire and the wire and dilator removed.  The 19 cm tunneled dialysis catheter was advanced through the peel-away sheath and positioned in the superior vena cava.  The exit site for the catheter was selected and the catheter was brought through the tunnel.  The distal ports were attached.  Both ports withdrew easily  with and flushed with heparinized saline and filled with concentrated heparin.  The catheter was secured at its exit site with a 3-0 nylon suture.  The IJ cannulation site was closed with a 4-0 Vicryl.  A sterile dressing was applied.  Attention was then turned to the left arm.  The left arm was prepped and draped in usual sterile fashion.  An elliptical incision was made over the pseudoaneurysm and this was dissected free and excised.  The neck of the pseudoaneurysm was repaired at this point the fistula had a reasonable thrill although clearly it was pulsatile at the wrist and then there was an area of stenosis on exam.  I felt we could potentially address this with patch angioplasty.  A longitudinal incision was made over this area and the fistula was dissected free proximal and distal to the stenosis.  Was marked intimal hyperplasia in this area.  The patient was heparinized.  The fistula was clamped proximal and distal to the stenosis and the vein opened longitudinally.  A bovine pericardial patch was then tailored and sewn with continuous 6-0 Prolene suture.  At this point there was excellent thrill in the fistula.  This wound was closed with a deep layer of 3-0 Vicryl and the skin closed with 4-0 Vicryl.  The incision where the pseudoaneurysm was excised, given the size and the with this was closed with interrupted 3 oh nylons.  Sterile dressing was applied.  The patient tolerated the procedure well transferred to the recovery room in stable condition.  All  needle and sponge counts were correct.  [name], MD, FACS [facility]  DATE OF DICTATION:   05/06/2018

## 2018-05-06 NOTE — Progress Notes (Signed)
PT Cancellation Note  Patient Details Name: Troy [name] MRN: [id number] DOB: [birth year]   Cancelled Treatment:    Reason Eval/Treat Not Completed: Medical issues which prohibited therapy. Pt had fistula repaired this morning, no pressure through LUE for use of RW today per Dr Wyline Copas. Will plan to follow up tomorrow.    [place] 05/06/2018, 4:48 PM

## 2018-05-06 NOTE — Progress Notes (Signed)
PROGRESS NOTE    Troy [name]  CXK:[id number] DOB: 12/23/1940 DOA: 04/18/2018 PCP: Gaynelle Arabian, MD    Brief Narrative:  77 y.o. 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 Also noted to have progressive renal failure with uremia for which nephrology was consulted and began hemodialysis 10/11 through remotely placed left RC AVF. An outpatient HD chair has been arranged.   Echocardiogram demonstrated worsening of LV dysfunction (EF 45-50% 2017 now 25-30%). Cardiology was consulted, recommending heart catheterization which the patient and family consented to after discussions with palliative care consultants. Left heart catheterization on 10/16 after vitamin K given for supratherapeutic INR showed LAD and RCA disease, though only LAD lesion was felt to be amenable to stenting. On 10/17 he developed erythema proximal to fistula site for which vancomycin and aztreonam were started empirically. Stenting to LAD was scheduled but postponed due to lack of dialysis access (fistula not able to be cannulated). IR placed dialysis catheter in right IJ for HD, though this has had recurrent bleeding issues for which dermabond and suturing were performed 10/20 with stability. Course further complicated by anemia due to bleeding and hematoma formation on the right arm (U/S ruled out clot). LHC planned 10/22 to be followed by fistulogram. Ultimate SNF disposition planned.  Assessment & Plan:   Principal Problem:   Acute on chronic systolic heart failure (HCC) Active Problems:   CKD (chronic kidney disease), stage V (HCC)   ESRD (end stage  renal disease) (HCC)   Leukocytosis   Atypical chest pain   Paroxysmal A-fib (HCC)   NSTEMI (non-ST elevated myocardial infarction) [facility])   Myocardial infarction [facility])   Palliative care by specialist   DNR (do not resuscitate) discussion  NSTEMI: With hx abnormal stress testing Jan 2018 not followed by LHC due to CKD.  - LHC 04/23/2018 showed LAD and RCA lesions, plan PCI to LAD 10/22 (delayed due to fistula issues) with further plan based on clinical course (aggressive medical management vs. staged PCI to RCA). -Continued DAPT, statin, and antihypertensives/antianginals as BP will tolerate. Per Cardiology note, plan would by to continue DAPT x30 days without coumadin, then stop aspirin, continue plavix and restart coumadin -Remains stable this AM without chest pains. Cardiology has since signed off   Uncontrolled bleeding: Difficult situation in patient that requires heparin for confirmed NSTEMI. Qualitative platelet dysfunction likely contributing.  - Continued with platelet as per above as tolerated.  -Ordered and reviewed soft tissue US of RUE. Findings of small amount of fluid and subcutaneous edema -Had earlier discussed case with General Surgery who does not recommend surgical drainage -Vascular Surgery following. S/p compression tx 10/24 -Hgb remains stable  Chronic combined HFrEF: EF worsened to 25-30%.  - Manage volume with HD.  - Otherwise on guideline directed therapy as BP allows - Stable on HD  Sinus bradycardia: Does not seem to be symptomatic.  - BB remains on hold  ESRD: CLIP process completed, has spot at [facility] MWF at 12:15pm.  - Dialysis per nephrology last 10/21 - fistula salvaged by Vascular Surgery 10/29, recommendation for using tunneled HD catheter and not to stick for another 8  weeks  AVF difficulty cannulation: Patent on U/S 10/20.  - HD thru right IJ. - Vascular surgery consulted. Underwent vascular procedure 10/29 where fistula was successfully  salvaged  Anemia of CKD and acute blood loss: s/p 1u PRBCs 10/11, 2u 10/12, 1 units 10/23  in setting of AVF bleeding and supratherapeutic INR, later had IJ cath site bleeding seems to have improved but now with significant hematoma/ecchymosis on right arm possibly related to blood draw. - Recheck CBC in AM -Hgb remains stable overnight - Continue EPO per nephrology  Left arm cellulitis vs. fistula infection: WBC climbed 19 > 30, improved with abx - Initial blood cultures negative, repeat cultures neg - Completed vancomycin, aztreonam for total of 7 days  Right arm edema, hematoma, extravasation: No SVT or DVT on U/S. Unable to DC blood thinners - Elevate above the level of the heart -RUE soft tissue US ordered and reviewed. Small fluid collection noted -Earlier discussed with General Surgery who did not recommend drainage of lesion.  -will stop subq heparin and transition to SCD's for DVT prophylaxis -RUE continues to improve on exam  Acute toxic metabolic encephalopathy: Due to uremia.  -Mentation is at baseline at this time  Paroxysmal AFib:  - Continue amiodarone. Held BB secondary to bradycardia - Continued on anticoagulation per above per Cardiology - Patient remains rate controlled  Supratherapeutic INR: Resolved s/p vit K.  PVD, carotid stenosis: s/p CEA 2011.  -Will continue with ASA, statin as tolerated  Hypothyroidism: Chronic, stable.  - Continue synthroid 75mcg - Remains stable at this time  DVT prophylaxis: Heparin subq Code Status: Full Family Communication: Pt in room, family not at bedside Disposition Plan: Uncertain at this time  Consultants:   Cardiology  Nephrology  Vascular Surgery  Procedures:   [facility] 04/23/2018  Antimicrobials: Anti-infectives (From admission, onward)   Start     Dose/Rate Route Frequency Ordered Stop   05/06/18 0600  vancomycin (VANCOCIN) IVPB 1000 mg/200 mL premix     1,000 mg 200 mL/hr over 60 Minutes  Intravenous On call to O.R. 05/05/18 1024 05/06/18 1123   05/01/18 1200  vancomycin (VANCOCIN) IVPB 750 mg/150 ml premix  Status:  Discontinued     750 mg 150 mL/hr over 60 Minutes Intravenous Every T-Th-Sa (Hemodialysis) 04/28/18 2115 04/29/18 1248   04/29/18 2200  aztreonam (AZACTAM) 0.5 g in dextrose 5 % 50 mL IVPB     0.5 g 100 mL/hr over 30 Minutes Intravenous Every 12 hours 04/29/18 1248 04/30/18 2223   04/29/18 1200  vancomycin (VANCOCIN) IVPB 750 mg/150 ml premix  Status:  Discontinued     750 mg 150 mL/hr over 60 Minutes Intravenous Every T-Th-Sa (Hemodialysis) 04/27/18 1037 04/28/18 2115   04/28/18 2200  vancomycin (VANCOCIN) IVPB 750 mg/150 ml premix     750 mg 150 mL/hr over 60 Minutes Intravenous  Once 04/28/18 2114 04/29/18 0034   04/28/18 1200  vancomycin (VANCOCIN) IVPB 750 mg/150 ml premix  Status:  Discontinued     750 mg 150 mL/hr over 60 Minutes Intravenous Every M-W-F (Hemodialysis) 04/25/18 1007 04/27/18 1037   04/26/18 1731  Vancomycin (VANCOCIN) 750-5 MG/150ML-% IVPB    Note to Pharmacy:  Alex Gardener   : cabinet override      04/26/18 1731 04/27/18 0544   04/26/18 1200  vancomycin (VANCOCIN) IVPB 750 mg/150 ml premix     750 mg 150 mL/hr over 60 Minutes Intravenous Every Sat (Hemodialysis) 04/26/18 1049 04/26/18 1900   04/25/18 1200  vancomycin (VANCOCIN) IVPB  750 mg/150 ml premix  Status:  Discontinued     750 mg 150 mL/hr over 60 Minutes Intravenous Every M-W-F (Hemodialysis) 04/24/18 0545 04/25/18 1007   04/24/18 0630  aztreonam (AZACTAM) 0.5 g in dextrose 5 % 50 mL IVPB  Status:  Discontinued     0.5 g 100 mL/hr over 30 Minutes Intravenous Every 12 hours 04/24/18 0552 04/29/18 1248   04/24/18 0600  vancomycin (VANCOCIN) 1,250 mg in sodium chloride 0.9 % 250 mL IVPB     1,250 mg 166.7 mL/hr over 90 Minutes Intravenous  Once 04/24/18 0545 04/24/18 0808      Subjective: Without complaints. Eager to start therapy at SNF  Objective: Vitals:   05/06/18  1345 05/06/18 1356 05/06/18 1400 05/06/18 1433  BP:  (!) 102/33 (!) 102/33   Pulse: 68 70 66   Resp: 11 16 12    Temp:  97.7 F (36.5 C)  98.1 F (36.7 C)  TempSrc:    Oral  SpO2: 95% 94% 95%   Weight:      Height:        Intake/Output Summary (Last 24 hours) at 05/06/2018 1806 Last data filed at 05/06/2018 1612 Gross per 24 hour  Intake 1149.67 ml  Output 105 ml  Net 1044.67 ml   Filed Weights   05/04/18 0500 05/05/18 0551 05/06/18 0452  Weight: 79.4 kg 73.6 kg 72.4 kg    Examination: General exam: Conversant, in no acute distress Respiratory system: normal chest rise, clear, no audible wheezing Cardiovascular system: regular rhythm, s1-s2 Gastrointestinal system: Nondistended, nontender, pos BS Central nervous system: No seizures, no tremors Extremities: No cyanosis, no joint deformities, fistula site covered in dressing Skin: No rashes, no pallor Psychiatry: Affect normal // no auditory hallucinations   Data Reviewed: I have personally reviewed following labs and imaging studies  CBC: Recent Labs  Lab 05/02/18 0339 05/03/18 0450 05/04/18 0752 05/05/18 0643 05/06/18 0334  WBC 16.7* 14.8* 13.8* 13.7* 13.7*  HGB 7.5* 8.6* 9.3* 9.6* 8.9*  HCT 25.1* 28.5* 30.4* 30.7* 29.6*  MCV 99.6 98.3 98.7 99.7 100.0  PLT 246 259 322 342 419   Basic Metabolic Panel: Recent Labs  Lab 04/30/18 0343 05/01/18 0340 05/02/18 0836 05/05/18 1303 05/06/18 0334  NA 138 136 138 140 138  K 4.3 3.8 3.4* 3.6 3.5  CL 102 101 102 104 103  CO2 24 27 26 27 26   GLUCOSE 86 96 117* 120* 94  BUN 26* 17 38* 56* 24*  CREATININE 4.29* 2.98* 4.95* 6.11* 3.65*  CALCIUM 7.6* 7.6* 7.6* 8.1* 7.8*  PHOS 4.5 2.9 4.4 3.3  --    GFR: Estimated Creatinine Clearance: 15.3 mL/min (A) (by C-G formula based on SCr of 3.65 mg/dL (H)). Liver Function Tests: Recent Labs  Lab 04/30/18 0343 05/01/18 0340 05/02/18 0836 05/05/18 1303  ALBUMIN 2.5* 2.3* 2.4* 2.3*   No results for input(s): LIPASE,  AMYLASE in the last 168 hours. No results for input(s): AMMONIA in the last 168 hours. Coagulation Profile: Recent Labs  Lab 05/05/18 1224 05/06/18 0334  INR 1.25 1.31   Cardiac Enzymes: No results for input(s): CKTOTAL, CKMB, CKMBINDEX, TROPONINI in the last 168 hours. BNP (last 3 results) No results for input(s): PROBNP in the last 8760 hours. HbA1C: No results for input(s): HGBA1C in the last 72 hours. CBG: Recent Labs  Lab 04/29/18 1905 04/30/18 1235  GLUCAP 102* 76   Lipid Profile: No results for input(s): CHOL, HDL, LDLCALC, TRIG, CHOLHDL, LDLDIRECT in the last 72 hours. Thyroid  Function Tests: No results for input(s): TSH, T4TOTAL, FREET4, T3FREE, THYROIDAB in the last 72 hours. Anemia Panel: No results for input(s): VITAMINB12, FOLATE, FERRITIN, TIBC, IRON, RETICCTPCT in the last 72 hours. Sepsis Labs: No results for input(s): PROCALCITON, LATICACIDVEN in the last 168 hours.  No results found for this or any previous visit (from the past 240 hour(s)).   Radiology Studies: Dg Chest Port 1 View  Result Date: 05/06/2018 CLINICAL DATA:  Dialysis catheter insertion EXAM: PORTABLE CHEST 1 VIEW COMPARISON:  None. FINDINGS: Right IJ dual lumen dialysis catheter is noted with tips in the the mid as well as distal SVC. Cardiomegaly with interstitial edema and small bilateral pleural effusions. No pneumothorax. Ectatic atherosclerotic aorta. No acute osseous abnormality. IMPRESSION: 1. Cardiomegaly with aortic atherosclerosis. Interstitial edema with small left greater than right pleural effusions. 2. Dialysis catheter from right IJ approach with tips in the mid and distal SVC respectively. No pneumothorax. Electronically Signed   By: Ashley Royalty M.D.   On: 05/06/2018 13:59   Dg Fluoro Guide Cv Line-no Report  Result Date: 05/06/2018 Fluoroscopy was utilized by the requesting physician.  No radiographic interpretation.    Scheduled Meds: . amiodarone  200 mg Oral Daily  .  aspirin EC  81 mg Oral Daily  . atorvastatin  20 mg Oral QHS  . bacitracin-polymyxin b   Both Eyes QID  . Chlorhexidine Gluconate Cloth  6 each Topical Q0600  . [START ON 05/07/2018] clopidogrel  75 mg Oral Daily  . Darbepoetin Alfa  200 mcg Intravenous Q Fri-HD  . feeding supplement (NEPRO CARB STEADY)  237 mL Oral BID BM  . feeding supplement (PRO-STAT SUGAR FREE 64)  30 mL Oral Daily  . fentaNYL      . levothyroxine  50 mcg Oral QAC breakfast  . lidocaine-prilocaine   Topical Q M,W,F  . multivitamin  1 tablet Oral QHS  . sodium chloride flush  3 mL Intravenous Q12H   Continuous Infusions: . sodium chloride       LOS: 18 days   Marylu Lund, MD [facility] Pager On Amion  If 7PM-7AM, please contact night-coverage 05/06/2018, 6:06 PM

## 2018-05-06 NOTE — Telephone Encounter (Signed)
-----   Message from Mena Goes, RN sent at 05/06/2018  1:52 PM EDT ----- Regarding: 2 weeks for suture removal and duplex   ----- Message ----- From: Gabriel Earing, PA-C Sent: 05/06/2018   1:12 PM EDT To: [facility] Admin Pool, [facility] Charge Pool  S/p revision of left RC AVF 05/06/18.  F/u with Dr. Scot Dock (MD only) in 2 weeks with duplex.  Pt will need to be seen in 2 weeks as he will also need suture removal.  Thanks.

## 2018-05-06 NOTE — Discharge Instructions (Signed)
° °  [facility]  Discharge Instructions  AV Fistula or Graft Surgery for Dialysis Access  Please refer to the following instructions for your post-procedure care. Your surgeon or physician assistant will discuss any changes with you.  Activity  You may drive the day following your surgery, if you are comfortable and no longer taking prescription pain medication. Resume full activity as the soreness in your incision resolves.  Bathing/Showering  You may shower after you go home. Keep your incision dry for 48 hours. Do not soak in a bathtub, hot tub, or swim until the incision heals completely. You may not shower if you have a hemodialysis catheter.  Incision Care  Clean your incision with mild soap and water after 48 hours. Pat the area dry with a clean towel. You do not need a bandage unless otherwise instructed. Do not apply any ointments or creams to your incision. You may have skin glue on your incision. Do not peel it off. It will come off on its own in about one week. Your arm may swell a bit after surgery. To reduce swelling use pillows to elevate your arm so it is above your heart. Your doctor will tell you if you need to lightly wrap your arm with an ACE bandage.  Diet  Resume your normal diet. There are not special food restrictions following this procedure. In order to heal from your surgery, it is CRITICAL to get adequate nutrition. Your body requires vitamins, minerals, and protein. Vegetables are the best source of vitamins and minerals. Vegetables also provide the perfect balance of protein. Processed food has little nutritional value, so try to avoid this.  Medications  Resume taking all of your medications. If your incision is causing pain, you may take over-the counter pain relievers such as acetaminophen (Tylenol). If you were prescribed a stronger pain medication, please be aware these medications can cause nausea and constipation. Prevent  nausea by taking the medication with a snack or meal. Avoid constipation by drinking plenty of fluids and eating foods with high amount of fiber, such as fruits, vegetables, and grains.  Do not take Tylenol if you are taking prescription pain medications.  Follow up Your surgeon may want to see you in the office following your access surgery. If so, this will be arranged at the time of your surgery.  Please call us immediately for any of the following conditions:  Increased pain, redness, drainage (pus) from your incision site Fever of 101 degrees or higher Severe or worsening pain at your incision site Hand pain or numbness.  Reduce your risk of vascular disease:  Stop smoking. If you would like help, call [facility] at 1-800-QUIT-NOW (812)715-0169) or [place] at [place] your cholesterol Maintain a desired weight Control your diabetes Keep your blood pressure down  Dialysis  It will take several weeks to several months for your new dialysis access to be ready for use. Your surgeon will determine when it is okay to use it. Your nephrologist will continue to direct your dialysis. You can continue to use your Permcath until your new access is ready for use.   05/06/2018 Troy [name] [id number] [birth year]  Surgeon(s): Angelia Mould, MD  Procedure(s): Revision of left radial cephalic AV fistula INSERTION OF DIALYSIS CATHETER, right internal jugular  x Do not stick fistula for 8 weeks    If you have any questions, please call the office at [phone].

## 2018-05-06 NOTE — Anesthesia Preprocedure Evaluation (Addendum)
Anesthesia Evaluation  Patient identified by MRN, date of birth, ID band Patient awake    Reviewed: Allergy & Precautions, NPO status , Patient's Chart, lab work & pertinent test results  History of Anesthesia Complications Negative for: history of anesthetic complications  Airway Mallampati: II  TM Distance: >3 FB Neck ROM: Full    Dental  (+) Edentulous Upper, Edentulous Lower   Pulmonary former smoker,    breath sounds clear to auscultation       Cardiovascular hypertension, Pt. on medications and Pt. on home beta blockers + Past MI, + Cardiac Stents, + Peripheral Vascular Disease (s/p Left CEA) and +CHF  + dysrhythmias Atrial Fibrillation  Rhythm:Regular Rate:Normal   '19 Cath - Mid LM to Dist LM lesion is 35% stenosed. Ost LAD lesion is 30% stenosed. Prox LAD lesion is 95% stenosed. A drug-eluting stent was successfully placed using a STENT SYNERGY DES 3X32. Post intervention, there is a 0% residual stenosis. Ost Cx to Prox Cx lesion is 60% stenosed. Ost RCA to Prox RCA lesion is 95% stenosed. Prox RCA lesion is 80% stenosed. Prox RCA to Mid RCA lesion is 99% stenosed. Mid RCA lesion is 80% stenosed. LV end diastolic pressure is normal. LVEDP 12 mm Hg. There is no aortic valve stenosis  '19 TTE (pre-cath/stent) - Mild LVH. EF 25% to 30%. Diffuse hypokinesis with regional variation. Doppler parameters are consistent with restrictive diastolic physiology. Moderate MR. Severely dilated LA. RV was mildly dilated. Moderately reduced systolic function. RA mildly dilated. Mild TR. PA peak pressure: 41 mm Hg.    Neuro/Psych PSYCHIATRIC DISORDERS Depression CVA (residual visual deficits right eye), Residual Symptoms    GI/Hepatic negative GI ROS, Neg liver ROS,   Endo/Other  Hypothyroidism   Renal/GU ESRF and DialysisRenal disease     Musculoskeletal  (+) Arthritis ,   Abdominal   Peds  Hematology  (+)  anemia ,   Anesthesia Other Findings   Reproductive/Obstetrics                          Anesthesia Physical Anesthesia Plan  ASA: IV  Anesthesia Plan: General   Post-op Pain Management:    Induction: Intravenous  PONV Risk Score and Plan: 2 and Treatment may vary due to age or medical condition and Ondansetron  Airway Management Planned: LMA  Additional Equipment: None  Intra-op Plan:   Post-operative Plan: Extubation in OR  Informed Consent: I have reviewed the patients History and Physical, chart, labs and discussed the procedure including the risks, benefits and alternatives for the proposed anesthesia with the patient or authorized representative who has indicated his/her understanding and acceptance.   Dental advisory given  Plan Discussed with: CRNA and Anesthesiologist  Anesthesia Plan Comments:        Anesthesia Quick Evaluation

## 2018-05-06 NOTE — Anesthesia Postprocedure Evaluation (Signed)
Anesthesia Post Note  Patient: Troy [name]  Procedure(s) Performed: revison of left arm radiocephalic  ARTERIOVENOUS  FISTULA Ewith bovine pericardium patch angioplasty and repair of pseudoaneurysm (Left Arm Lower) INSERTION OF DIALYSIS CATHETER, right internal jugular (Right Neck)     Patient location during evaluation: PACU Anesthesia Type: General Level of consciousness: awake and alert Pain management: pain level controlled Vital Signs Assessment: post-procedure vital signs reviewed and stable Respiratory status: spontaneous breathing, nonlabored ventilation, respiratory function stable and patient connected to nasal cannula oxygen Cardiovascular status: blood pressure returned to baseline and stable Postop Assessment: no apparent nausea or vomiting Anesthetic complications: no    Last Vitals:  Vitals:   05/06/18 1400 05/06/18 1433  BP: (!) 102/33   Pulse: 66   Resp: 12   Temp:  36.7 C  SpO2: 95%     Last Pain:  Vitals:   05/06/18 1433  TempSrc: Oral  PainSc:                  Audry Pili

## 2018-05-06 NOTE — Telephone Encounter (Signed)
sch appt spk to pt daughter mld ltr 05/21/18 11am Diaysis duplex 1130am suture removal MD

## 2018-05-06 NOTE — Interval H&P Note (Signed)
History and Physical Interval Note:  05/06/2018 10:46 AM  Troy [name]  has presented today for surgery, with the diagnosis of ulcer on fistula  The various methods of treatment have been discussed with the patient and family. After consideration of risks, benefits and other options for treatment, the patient has consented to  Procedure(s): LIGATION OF ARTERIOVENOUS  FISTULA EXCISION OF ULCER (Left) ARTERIOVENOUS (AV) FISTULA CREATION NEW LEFT UPPER ARM VERSUS GRAFT (Left) INSERTION OF DIALYSIS CATHETER (N/A) as a surgical intervention .  The patient's history has been reviewed, patient examined, no change in status, stable for surgery.  I have reviewed the patient's chart and labs.  Questions were answered to the patient's satisfaction.     [name]

## 2018-05-06 NOTE — Transfer of Care (Signed)
Immediate Anesthesia Transfer of Care Note  Patient: Troy [name]  Procedure(s) Performed: revison of left arm radiocephalic  ARTERIOVENOUS  FISTULA Ewith bovine pericardium patch angioplasty and repair of pseudoaneurysm (Left Arm Lower) INSERTION OF DIALYSIS CATHETER, right internal jugular (Right Neck)  Patient Location: PACU  Anesthesia Type:General  Level of Consciousness: drowsy and patient cooperative  Airway & Oxygen Therapy: Patient Spontanous Breathing and Patient connected to face mask oxygen  Post-op Assessment: Report given to RN, Post -op Vital signs reviewed and stable and Patient moving all extremities  Post vital signs: Reviewed and stable  Last Vitals:  Vitals Value Taken Time  BP 103/41 05/06/2018  1:26 PM  Temp 36.2 C 05/06/2018  1:26 PM  Pulse 72 05/06/2018  1:28 PM  Resp 14 05/06/2018  1:28 PM  SpO2 96 % 05/06/2018  1:28 PM  Vitals shown include unvalidated device data.  Last Pain:  Vitals:   05/06/18 1030  TempSrc:   PainSc: 0-No pain      Patients Stated Pain Goal: 0 (56/[id number]/16 [id number])  Complications: No apparent anesthesia complications

## 2018-05-06 NOTE — Progress Notes (Signed)
Nutrition Follow-up  DOCUMENTATION CODES:   Not applicable  INTERVENTION:    Continue Nepro Shake po BID, each supplement provides 425 kcal and 19 grams protein  Continue Pro-stat 30 ml BID, each supplement provides 100  kcal and 15 gm protein  Continue Renal multivitamin  NUTRITION DIAGNOSIS:   Increased nutrient needs related to acute illness, chronic illness as evidenced by estimated needs.  Ongoing  GOAL:   Patient will meet greater than or equal to 90% of their needs  Progressing  MONITOR:   PO intake, Supplement acceptance, Weight trends, Labs, I & O's   ASSESSMENT:   77 y.o. 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 Patient started on HD on 10/11.  AV fistula with ulceration. 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  PO intake has been variable, consuming 25-100% of meals. He is being offered Nepro and pro-stat supplements.  Labs and medications reviewed.   Diet Order:   Diet Order            Diet renal with fluid restriction Fluid restriction: 1200 mL Fluid; Room service appropriate? Yes; Fluid consistency: Thin  Diet effective now              EDUCATION NEEDS:   Not appropriate for education at this time  Skin:  Skin Assessment: Reviewed RN Assessment  Last BM:  10/28  Height:   Ht Readings from Last 1 Encounters:  04/18/18 5\' 6"  (1.676 m)    Weight:   Wt Readings from Last 1 Encounters:  05/06/18 72.4 kg    Ideal Body Weight:  64.54  kg  BMI:  Body mass index is 25.76 kg/m.  Estimated Nutritional Needs:   Kcal:  2100-2300  Protein:  95-105 grams  Fluid:  UOP +1 L    Molli Barrows, RD, LDN, [facility] Pager [phone] After Hours Pager [phone]

## 2018-05-07 ENCOUNTER — Encounter (HOSPITAL_COMMUNITY): Payer: Self-pay | Admitting: Vascular Surgery

## 2018-05-07 DIAGNOSIS — D631 Anemia in chronic kidney disease: Secondary | ICD-10-CM

## 2018-05-07 LAB — IRON AND TIBC
Iron: 44 ug/dL — ABNORMAL LOW (ref 45–182)
SATURATION RATIOS: 31 % (ref 17.9–39.5)
TIBC: 144 ug/dL — ABNORMAL LOW (ref 250–450)
UIBC: 100 ug/dL

## 2018-05-07 LAB — RENAL FUNCTION PANEL
Albumin: 2.2 g/dL — ABNORMAL LOW (ref 3.5–5.0)
Anion gap: 12 (ref 5–15)
BUN: 43 mg/dL — AB (ref 8–23)
CO2: 27 mmol/L (ref 22–32)
Calcium: 7.9 mg/dL — ABNORMAL LOW (ref 8.9–10.3)
Chloride: 102 mmol/L (ref 98–111)
Creatinine, Ser: 5.13 mg/dL — ABNORMAL HIGH (ref 0.61–1.24)
GFR calc Af Amer: 11 mL/min — ABNORMAL LOW (ref >60)
GFR, EST NON AFRICAN AMERICAN: 10 mL/min — AB (ref >60)
Glucose, Bld: 115 mg/dL — ABNORMAL HIGH (ref 70–99)
POTASSIUM: 3.9 mmol/L (ref 3.5–5.1)
Phosphorus: 5 mg/dL — ABNORMAL HIGH (ref 2.5–4.6)
Sodium: 141 mmol/L (ref 135–145)

## 2018-05-07 LAB — FERRITIN: Ferritin: 1306 ng/mL — ABNORMAL HIGH (ref 24–336)

## 2018-05-07 LAB — CBC
HEMATOCRIT: 26.9 % — AB (ref 39.0–52.0)
Hemoglobin: 7.8 g/dL — ABNORMAL LOW (ref 13.0–17.0)
MCH: 30.4 pg (ref 26.0–34.0)
MCHC: 29 g/dL — ABNORMAL LOW (ref 30.0–36.0)
MCV: 104.7 fL — ABNORMAL HIGH (ref 80.0–100.0)
NRBC: 0 % (ref 0.0–0.2)
Platelets: 333 10*3/uL (ref 150–400)
RBC: 2.57 MIL/uL — ABNORMAL LOW (ref 4.22–5.81)
RDW: 19.6 % — ABNORMAL HIGH (ref 11.5–15.5)
WBC: 13.2 10*3/uL — AB (ref 4.0–10.5)

## 2018-05-07 MED ORDER — CALCIUM ACETATE (PHOS BINDER) 667 MG PO CAPS
667.0000 mg | ORAL_CAPSULE | Freq: Two times a day (BID) | ORAL | Status: DC
  Administered 2018-05-07 – 2018-05-08 (×3): 667 mg via ORAL
  Filled 2018-05-07 (×3): qty 1

## 2018-05-07 MED ORDER — HEPARIN SODIUM (PORCINE) 1000 UNIT/ML IJ SOLN
INTRAMUSCULAR | Status: AC
  Administered 2018-05-07: 4200 [IU] via INTRAVENOUS
  Filled 2018-05-07: qty 5

## 2018-05-07 NOTE — Progress Notes (Signed)
Physical Therapy Treatment Patient Details Name: Troy [name] MRN: [id number] DOB: [birth year] Today's Date: 05/07/2018    History of Present Illness Patient is a 77 y.o. male admitted 04/18/18 with chest pain. Found to have NSTEMI, uremia and new ESRD; started on HD. Pt with severe R CAD and significant LAD disease. IR placed RIJ HD cath, though this has had recurrent bleeding issues s/p suturing 10/20 for stability. Course further complicated by anemia due to bleeding and hematoma formation on RUE (U/S ruled out clot). S/p LHC 10/22. PMH includes stroke, PAF, HTN, CKD, CHF.    PT Comments    Patient making slow and steady progress towards his goals. Verbalizes frustration on not being approved for SNF yet. Session focused on continual progression of mobility and activity tolerance. Ambulating limited hallway distances with walker and min guard assist. Gait speed of 1.02 ft/s indicating patient remains at high risk for falls. Continue to recommend SNF for ongoing Physical Therapy.      Follow Up Recommendations  SNF;Supervision for mobility/OOB     Equipment Recommendations  None recommended by PT    Recommendations for Other Services       Precautions / Restrictions Precautions Precautions: Fall Restrictions Weight Bearing Restrictions: No    Mobility  Bed Mobility Overal bed mobility: Needs Assistance Bed Mobility: Supine to Sit     Supine to sit: Supervision        Transfers Overall transfer level: Needs assistance Equipment used: Rolling walker (2 wheeled) Transfers: Sit to/from Stand Sit to Stand: Min guard         General transfer comment: min guard from edge of bed  Ambulation/Gait Ambulation/Gait assistance: Min guard Gait Distance (Feet): 120 Feet Assistive device: Rolling walker (2 wheeled) Gait Pattern/deviations: Step-through pattern;Decreased stride length;Trunk flexed;Antalgic Gait velocity: 1.02 ft/s Gait velocity interpretation: <1.31 ft/sec,  indicative of household ambulator General Gait Details: Cues for walker proximity and upright posture. Walker adjusted for comfort. HR 80-90s bpm.   Stairs             Wheelchair Mobility    Modified Rankin (Stroke Patients Only)       Balance Overall balance assessment: Needs assistance Sitting-balance support: Feet supported;Single extremity supported Sitting balance-Leahy Scale: Fair     Standing balance support: During functional activity;Bilateral upper extremity supported Standing balance-Leahy Scale: Poor Standing balance comment: Reliant on UE support                            Cognition Arousal/Alertness: Awake/alert Behavior During Therapy: WFL for tasks assessed/performed Overall Cognitive Status: Within Functional Limits for tasks assessed                                        Exercises      General Comments        Pertinent Vitals/Pain Pain Assessment: Faces Faces Pain Scale: Hurts little more Pain Location: L knee with mobility Pain Descriptors / Indicators: Sore Pain Intervention(s): Monitored during session    Home Living                      Prior Function            PT Goals (current goals can now be found in the care plan section) Acute Rehab PT Goals Patient Stated Goal: to get better Potential to Achieve  Goals: Good Progress towards PT goals: Progressing toward goals    Frequency    Min 3X/week      PT Plan Current plan remains appropriate    Co-evaluation              AM-PAC PT "6 Clicks" Daily Activity  Outcome Measure  Difficulty turning over in bed (including adjusting bedclothes, sheets and blankets)?: A Little Difficulty moving from lying on back to sitting on the side of the bed? : A Little Difficulty sitting down on and standing up from a chair with arms (e.g., wheelchair, bedside commode, etc,.)?: A Little Help needed moving to and from a bed to chair (including a  wheelchair)?: A Little Help needed walking in hospital room?: A Little Help needed climbing 3-5 steps with a railing? : A Lot 6 Click Score: 17    End of Session Equipment Utilized During Treatment: Gait belt Activity Tolerance: Patient tolerated treatment well Patient left: in bed;with call bell/phone within reach Nurse Communication: Mobility status PT Visit Diagnosis: Muscle weakness (generalized) (M62.81);Difficulty in walking, not elsewhere classified (R26.2);Unsteadiness on feet (R26.81)     Time: 3664-4034 PT Time Calculation (min) (ACUTE ONLY): 14 min  Charges:  $Gait Training: 8-22 mins                    Troy [name], [place], DPT Acute Rehabilitation Services Pager [phone] Office [phone]    Willy Eddy 05/07/2018, 4:40 PM

## 2018-05-07 NOTE — Progress Notes (Signed)
Patient's [facility] authorization for SNF remains pending. Patient requires auth before admitting to SNF. Patient will go to [place] when [place] completed. Updated patient's daughter. CSW to follow and support with discharge planning.  Troy [name], [place]

## 2018-05-07 NOTE — Progress Notes (Addendum)
  Postoperative hemodialysis access     Date of Surgery:  05/06/18 Surgeon: Scot Dock  Subjective:  No complaints; seen on HD  PHYSICAL EXAMINATION:  Vitals:   05/07/18 0800 05/07/18 0830  BP: (!) 117/37 (!) 94/25  Pulse: (!) 59 (!) 58  Resp:    Temp:    SpO2:      Incisions are clean with nylon sutures in tact Sensation in digits is intact;  There is  Thrill and it is easily palpable    ASSESSMENT/PLAN:  Troy [name] is a 77 y.o. 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 Scot Dock in a couple of weeks for suture removal and duplex of fistula -do not use for 8 weeks to allow fistula to rest.   -will sign off-call as needed.   Leontine Locket, PA-C [facility] [phone]   I have interviewed the patient and examined the patient. I agree with the findings by the PA.  Gae Gallop, MD [phone]

## 2018-05-07 NOTE — Progress Notes (Signed)
PROGRESS NOTE  Troy [name] JOA:[id number] DOB: Jun 17, 1941 DOA: 04/18/2018 PCP: Gaynelle Arabian, MD  HPI/Recap of past 24 hours: 77 y.o.malewith a history of stage 5 CKD not previously on HD, abnormal stress test Jan 2018 (pt declined LHC due to concern for renal impairment), PVD with carotid artery occlusion s/p left CEA in 2011, atrial fibrillation on coumadin, chronic combined systolic and diastolic CHF, moderate MR, HTN, HLD and history of strokewho presented with chest pain on 10/11 and was found to have elevated troponin consistent with NSTEMI. Also noted to have progressive renal failure with uremia for which nephrology was consulted and began hemodialysis 10/11 through remotely placed left RC AVF. An outpatient HD chair has been arranged.   05/07/2018: Patient seen and examined at his bedside in the dialysis center.  He has no new complaints.  No acute events overnight.  We will continue to monitor vital signs post hemodialysis today.  Assessment/Plan: Principal Problem:   Acute on chronic systolic heart failure (HCC) Active Problems:   CKD (chronic kidney disease), stage V (HCC)   ESRD (end stage renal disease) (HCC)   Leukocytosis   Atypical chest pain   Paroxysmal A-fib (HCC)   NSTEMI (non-ST elevated myocardial infarction) [facility])   Myocardial infarction [facility])   Palliative care by specialist   DNR (do not resuscitate) discussion  NSTEMI Left heart cath on 04/23/2018 showed LAD and RCA lesions. Continue dual antiplatelet therapy, statin and antihypertensive as recommended by cardiology  Chronic systolic CHF with LVEF 25 to 30% Will have volume removed by dialysis Continue to monitor on telemetry  Right arm edema, hematoma Continue to elevate  New end-stage renal disease on dialysis Monday Wednesday Friday Has outpatient spot second shift upon discharge Nephrology following  Physical debility/ambulatory dysfunction Plan to discharge to SNF  PVD, carotid  stenosis status post CEA in 2011 Continue aspirin statin  Hypothyroidism Continue Synthroid  Paroxysmal A. fib Continue amiodarone Not on anticoagulation due to risk of bleeding  Anemia of chronic disease secondary to advanced kidney failure Aranesp per nephrology No overt sign of bleeding   Code Status: Full code  Family Communication: None at bedside  Disposition Plan: SNF possibly tomorrow 05/08/2018   Consultants:  Nephrology  Cardiology  Vascular surgery  Procedures:  Left heart cath  Antimicrobials:  None  DVT prophylaxis: SCDs   Objective: Vitals:   05/07/18 0930 05/07/18 1000 05/07/18 1030 05/07/18 1100  BP: (!) 98/41 131/71 (!) 118/45 118/67  Pulse: (!) 59 60 (!) 59 66  Resp:      Temp:      TempSrc:      SpO2:      Weight:      Height:        Intake/Output Summary (Last 24 hours) at 05/07/2018 1131 Last data filed at 05/06/2018 1921 Gross per 24 hour  Intake 1389.67 ml  Output 275 ml  Net 1114.67 ml   Filed Weights   05/06/18 0452 05/07/18 0624 05/07/18 0720  Weight: 72.4 kg 73.9 kg 73.9 kg    Exam:  . General: 77 y.o. year-old male well developed well nourished in no acute distress.  Alert and interactive. . Cardiovascular: Regular rate and rhythm with no rubs or gallops.  No thyromegaly or JVD noted.   Marland Kitchen Respiratory: Clear to auscultation with no wheezes or rales. Good inspiratory effort. . Abdomen: Soft nontender nondistended with normal bowel sounds x4 quadrants. . Musculoskeletal: No lower extremity edema. 2/4 pulses in all 4 extremities. . Skin:  No ulcerative lesions noted or rashes . Psychiatry: Mood is appropriate for condition and setting   Data Reviewed: CBC: Recent Labs  Lab 05/03/18 0450 05/04/18 0752 05/05/18 0643 05/06/18 0334 05/07/18 0741  WBC 14.8* 13.8* 13.7* 13.7* 13.2*  HGB 8.6* 9.3* 9.6* 8.9* 7.8*  HCT 28.5* 30.4* 30.7* 29.6* 26.9*  MCV 98.3 98.7 99.7 100.0 104.7*  PLT 259 322 342 366 154    Basic Metabolic Panel: Recent Labs  Lab 05/01/18 0340 05/02/18 0836 05/05/18 1303 05/06/18 0334 05/07/18 0741  NA 136 138 140 138 141  K 3.8 3.4* 3.6 3.5 3.9  CL 101 102 104 103 102  CO2 27 26 27 26 27   GLUCOSE 96 117* 120* 94 115*  BUN 17 38* 56* 24* 43*  CREATININE 2.98* 4.95* 6.11* 3.65* 5.13*  CALCIUM 7.6* 7.6* 8.1* 7.8* 7.9*  PHOS 2.9 4.4 3.3  --  5.0*   GFR: Estimated Creatinine Clearance: 10.9 mL/min (A) (by C-G formula based on SCr of 5.13 mg/dL (H)). Liver Function Tests: Recent Labs  Lab 05/01/18 0340 05/02/18 0836 05/05/18 1303 05/07/18 0741  ALBUMIN 2.3* 2.4* 2.3* 2.2*   No results for input(s): LIPASE, AMYLASE in the last 168 hours. No results for input(s): AMMONIA in the last 168 hours. Coagulation Profile: Recent Labs  Lab 05/05/18 1224 05/06/18 0334  INR 1.25 1.31   Cardiac Enzymes: No results for input(s): CKTOTAL, CKMB, CKMBINDEX, TROPONINI in the last 168 hours. BNP (last 3 results) No results for input(s): PROBNP in the last 8760 hours. HbA1C: No results for input(s): HGBA1C in the last 72 hours. CBG: Recent Labs  Lab 04/30/18 1235  GLUCAP 76   Lipid Profile: No results for input(s): CHOL, HDL, LDLCALC, TRIG, CHOLHDL, LDLDIRECT in the last 72 hours. Thyroid Function Tests: No results for input(s): TSH, T4TOTAL, FREET4, T3FREE, THYROIDAB in the last 72 hours. Anemia Panel: Recent Labs    05/07/18 0339  FERRITIN 1,306*  TIBC 144*  IRON 44*   Urine analysis:    Component Value Date/Time   COLORURINE YELLOW 06/05/2016 0328   APPEARANCEUR CLEAR 06/05/2016 0328   LABSPEC 1.010 06/05/2016 0328   PHURINE 5.0 06/05/2016 0328   GLUCOSEU NEGATIVE 06/05/2016 0328   HGBUR TRACE (A) 06/05/2016 0328   BILIRUBINUR NEGATIVE 06/05/2016 0328   KETONESUR NEGATIVE 06/05/2016 0328   PROTEINUR NEGATIVE 06/05/2016 0328   UROBILINOGEN 0.2 02/15/2010 0845   NITRITE NEGATIVE 06/05/2016 0328   LEUKOCYTESUR NEGATIVE 06/05/2016 0328   Sepsis  Labs: @LABRCNTIP (procalcitonin:4,lacticidven:4)  )No results found for this or any previous visit (from the past 240 hour(s)).    Studies: Dg Chest Port 1 View  Result Date: 05/06/2018 CLINICAL DATA:  Dialysis catheter insertion EXAM: PORTABLE CHEST 1 VIEW COMPARISON:  None. FINDINGS: Right IJ dual lumen dialysis catheter is noted with tips in the the mid as well as distal SVC. Cardiomegaly with interstitial edema and small bilateral pleural effusions. No pneumothorax. Ectatic atherosclerotic aorta. No acute osseous abnormality. IMPRESSION: 1. Cardiomegaly with aortic atherosclerosis. Interstitial edema with small left greater than right pleural effusions. 2. Dialysis catheter from right IJ approach with tips in the mid and distal SVC respectively. No pneumothorax. Electronically Signed   By: Ashley Royalty M.D.   On: 05/06/2018 13:59   Dg Fluoro Guide Cv Line-no Report  Result Date: 05/06/2018 Fluoroscopy was utilized by the requesting physician.  No radiographic interpretation.    Scheduled Meds: . amiodarone  200 mg Oral Daily  . aspirin EC  81 mg Oral Daily  .  atorvastatin  20 mg Oral QHS  . bacitracin-polymyxin b   Both Eyes QID  . calcium acetate  667 mg Oral BID WC  . Chlorhexidine Gluconate Cloth  6 each Topical Q0600  . clopidogrel  75 mg Oral Daily  . Darbepoetin Alfa  200 mcg Intravenous Q Fri-HD  . feeding supplement (NEPRO CARB STEADY)  237 mL Oral BID BM  . feeding supplement (PRO-STAT SUGAR FREE 64)  30 mL Oral Daily  . levothyroxine  50 mcg Oral QAC breakfast  . lidocaine-prilocaine   Topical Q M,W,F  . multivitamin  1 tablet Oral QHS  . sodium chloride flush  3 mL Intravenous Q12H    Continuous Infusions: . sodium chloride       LOS: 19 days     Kayleen Memos, MD [facility] Pager [phone]  If 7PM-7AM, please contact night-coverage [email or web address] Password [email or web address] 05/07/2018, 11:31 AM

## 2018-05-07 NOTE — Progress Notes (Signed)
[facility] Progress Note    Assessment/ Plan:   Pt is a48 y.o.yo malewho was admitted on 10/11/2019with new start to HD- hosp complicated by worsening cardiac status/Afib, cath showing CAD s/pintervention- also poorly functioning AVF   1. Renal- new start to HD- now running MWF- has OP spot at [facility] MWF second shift upon D/C. Access issues- bleeding from AVF.  Seen on HD today 98/41 asympt UF goal 3L net 4K bath through RIJ TC  - Appreciate Dr. 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 Pt is very pleased but will not be able to use the fistula for a minimum of 8weeks per [facility].  2. CAD/afib/CM-cath showing CAD- s/p stenting to LAD per cards - plavix 3. Anemia- CKD and ongoing bleeding - ESA and iron being given- drifting down- gave blood 10/23 with HD and 10/25 for hgb of 7.5--- is now9.3.  4. Secondary hyperparathyroidism- PTH 63- I think was on vit D as OP, none here. - phos now 5 -> will start phoslo BID w/ meals (breakfast and lunch for now); may eventually need to change frequency. 5. HTN/volume- BP soft, amiodarone- not on any BP meds  Subjective:   Mild discomfort in the right arm which is swollen. O/w denies f/c/n/v/dyspnea  Minimal discomfort in surgical sites.   Objective:   BP 131/71   Pulse 60   Temp 98.2 F (36.8 C) (Oral)   Resp 14   Ht 5\' 6"  (1.676 m)   Wt 73.9 kg   SpO2 97%   BMI 26.30 kg/m   Intake/Output Summary (Last 24 hours) at 05/07/2018 1028 Last data filed at 05/06/2018 1921 Gross per 24 hour  Intake 1389.67 ml  Output 275 ml  Net 1114.67 ml   Weight change: 1.542 kg  Physical Exam: General: alert, pleasant Heart: brady Lungs: mostly clear Abdomen: soft, non tender Extremities: min edema Dialysis Access: right sided TC, good bruit in left RC AVF with 2 surgical wounds  Imaging: Dg Chest Port 1 View  Result Date:  05/06/2018 CLINICAL DATA:  Dialysis catheter insertion EXAM: PORTABLE CHEST 1 VIEW COMPARISON:  None. FINDINGS: Right IJ dual lumen dialysis catheter is noted with tips in the the mid as well as distal SVC. Cardiomegaly with interstitial edema and small bilateral pleural effusions. No pneumothorax. Ectatic atherosclerotic aorta. No acute osseous abnormality. IMPRESSION: 1. Cardiomegaly with aortic atherosclerosis. Interstitial edema with small left greater than right pleural effusions. 2. Dialysis catheter from right IJ approach with tips in the mid and distal SVC respectively. No pneumothorax. Electronically Signed   By: Ashley Royalty M.D.   On: 05/06/2018 13:59   Dg Fluoro Guide Cv Line-no Report  Result Date: 05/06/2018 Fluoroscopy was utilized by the requesting physician.  No radiographic interpretation.    Labs: BMET Recent Labs  Lab 05/01/18 0340 05/02/18 0836 05/05/18 1303 05/06/18 0334 05/07/18 0741  NA 136 138 140 138 141  K 3.8 3.4* 3.6 3.5 3.9  CL 101 102 104 103 102  CO2 27 26 27 26 27   GLUCOSE 96 117* 120* 94 115*  BUN 17 38* 56* 24* 43*  CREATININE 2.98* 4.95* 6.11* 3.65* 5.13*  CALCIUM 7.6* 7.6* 8.1* 7.8* 7.9*  PHOS 2.9 4.4 3.3  --  5.0*   CBC Recent Labs  Lab 05/04/18 0752 05/05/18 0643 05/06/18 0334 05/07/18 0741  WBC 13.8* 13.7* 13.7* 13.2*  HGB 9.3* 9.6* 8.9* 7.8*  HCT  30.4* 30.7* 29.6* 26.9*  MCV 98.7 99.7 100.0 104.7*  PLT 322 342 366 333    Medications:    . amiodarone  200 mg Oral Daily  . aspirin EC  81 mg Oral Daily  . atorvastatin  20 mg Oral QHS  . bacitracin-polymyxin b   Both Eyes QID  . Chlorhexidine Gluconate Cloth  6 each Topical Q0600  . clopidogrel  75 mg Oral Daily  . Darbepoetin Alfa  200 mcg Intravenous Q Fri-HD  . feeding supplement (NEPRO CARB STEADY)  237 mL Oral BID BM  . feeding supplement (PRO-STAT SUGAR FREE 64)  30 mL Oral Daily  . levothyroxine  50 mcg Oral QAC breakfast  . lidocaine-prilocaine   Topical Q M,W,F  .  multivitamin  1 tablet Oral QHS  . sodium chloride flush  3 mL Intravenous Q12H      Otelia Santee, MD 05/07/2018, 10:28 AM

## 2018-05-07 NOTE — Care Management Important Message (Signed)
Important Message  Patient Details  Name: Troy [name] MRN: [id number] Date of Birth: 08-21-1940   Medicare Important Message Given:  Yes    [name] 05/07/2018, 2:36 PM

## 2018-05-07 NOTE — Progress Notes (Signed)
PT Cancellation Note  Patient Details Name: Troy [name] MRN: [id number] DOB: [birth year]   Cancelled Treatment:    Reason Eval/Treat Not Completed: Patient at procedure or test/unavailable(Pt in HD.  Will check back as able. )   Troy [name] 05/07/2018, 9:35 AM  Aren Pryde,PT Acute Rehabilitation Services Pager:  [phone]  Office:  [phone]

## 2018-05-08 LAB — CBC
HCT: 31.1 % — ABNORMAL LOW (ref 39.0–52.0)
Hemoglobin: 9 g/dL — ABNORMAL LOW (ref 13.0–17.0)
MCH: 30 pg (ref 26.0–34.0)
MCHC: 28.9 g/dL — AB (ref 30.0–36.0)
MCV: 103.7 fL — ABNORMAL HIGH (ref 80.0–100.0)
NRBC: 0 % (ref 0.0–0.2)
Platelets: 395 10*3/uL (ref 150–400)
RBC: 3 MIL/uL — ABNORMAL LOW (ref 4.22–5.81)
RDW: 20 % — AB (ref 11.5–15.5)
WBC: 12.1 10*3/uL — AB (ref 4.0–10.5)

## 2018-05-08 LAB — PROCALCITONIN: Procalcitonin: 0.81 ng/mL

## 2018-05-08 MED ORDER — NEPRO/CARBSTEADY PO LIQD: 237.0000 mL | Freq: Two times a day (BID) | ORAL | 0 refills | Status: DC

## 2018-05-08 MED ORDER — CLOPIDOGREL BISULFATE 75 MG PO TABS: 75.0000 mg | ORAL_TABLET | Freq: Every day | ORAL | 0 refills | Status: DC

## 2018-05-08 MED ORDER — RENA-VITE PO TABS: 1.0000 | ORAL_TABLET | Freq: Every day | ORAL | 0 refills | Status: DC

## 2018-05-08 MED ORDER — LIDOCAINE-PRILOCAINE 2.5-2.5 % EX CREA: TOPICAL_CREAM | CUTANEOUS | 0 refills | Status: AC

## 2018-05-08 MED ORDER — CALCIUM ACETATE (PHOS BINDER) 667 MG PO CAPS: 667.0000 mg | ORAL_CAPSULE | Freq: Two times a day (BID) | ORAL | 0 refills | Status: DC

## 2018-05-08 MED ORDER — ASPIRIN 81 MG PO TBEC: 81.0000 mg | DELAYED_RELEASE_TABLET | Freq: Every day | ORAL | 0 refills | Status: AC

## 2018-05-08 MED ORDER — BACITRACIN-POLYMYXIN B 500-10000 UNIT/GM OP OINT: TOPICAL_OINTMENT | Freq: Four times a day (QID) | OPHTHALMIC | 0 refills | Status: DC

## 2018-05-08 NOTE — Progress Notes (Signed)
CARDIAC REHAB PHASE I   PRE:  Rate/Rhythm: 49 SR  BP:  Sitting: 122/49      SaO2: 96 RA  MODE:  Ambulation: 190 ft   POST:  Rate/Rhythm: 91 SR with PVCs  BP:  Sitting: 129/67    SaO2: 99 RA   Pt ambulated 15ft in hallway assist of 2 with front wheel walker. Pt denies CP or SOB. Pt is ready to get home to his wife, but understands need for rehab. Encouragement and support provided to pt. Will continue to follow.  [id number] Rufina Falco, RN BSN 05/08/2018 11:04 AM

## 2018-05-08 NOTE — Progress Notes (Signed)
Patient's SNF continues to await insurance authorization. CSW met with patient at bedside and discussed discharge plan to SNF. Patient frustrated with having to be in the hospital for so long and frustrated with waiting for insurance approval. CSW provided support and encouragement and acknowledged patient's concerns. CSW explained insurance approval process and reason for delay. CSW also updated patient's daughter. CSW to follow and support with discharge when insurance authorization complete.  Estanislado Emms, [place]

## 2018-05-08 NOTE — Progress Notes (Signed)
OT Cancellation Note  Patient Details Name: Troy [name] MRN: [id number] DOB: 06/22/1941   Cancelled Treatment:    Reason Eval/Treat Not Completed: Other (comment). Pt reports he recently finished walking in the hallway and just returned to bed to rest. Reports he sat up in chair this morning. Politely requesting OT to come back later.   Tyrone Schimke, OT Acute Rehabilitation Services Pager: [phone] Office: [phone]  05/08/2018, 1:15 PM

## 2018-05-08 NOTE — Discharge Summary (Signed)
Discharge Summary  Troy [name] VQQ:[id number] DOB: [birth year]  PCP: Gaynelle Arabian, MD  Admit date: 04/18/2018 Discharge date: 05/08/2018  Time spent: 35 minutes  Recommendations for Outpatient Follow-up:  1. Continue HD as scheduled 2. Follow up with your cardiologist 3. Follow up with your nephrologist 4. Follow up with your PCP 5. Take your medications as prescribed  Discharge Diagnoses:  Active Hospital Problems   Diagnosis Date Noted  . Acute on chronic systolic heart failure ([place])   . Palliative care by specialist   . DNR (do not resuscitate) discussion   . Myocardial infarction ([place]) 04/18/2018  . NSTEMI (non-ST elevated myocardial infarction) ([place])   . Paroxysmal A-fib ([place])   . Atypical chest pain 06/07/2016  . Leukocytosis 06/05/2016  . ESRD (end stage renal disease) ([place]) 02/13/2012  . CKD (chronic kidney disease), stage V ([place]) 10/13/2011    Resolved Hospital Problems  No resolved problems to display.    Discharge Condition: stable  Diet recommendation: Renal diet  Vitals:   05/08/18 0515 05/08/18 1537  BP: (!) 105/46 (!) 107/43  Pulse: 73 77  Resp: 16   Temp: 97.6 F (36.4 C) 98.6 F (37 C)  SpO2: 98% 96%    History of present illness:  77 y.o.malewith a history of stage 5 CKD not previously on HD, abnormal stress test Jan 2018 (pt declined [facility] due to concern for renal impairment), PVD with carotid artery occlusion s/p left CEA in 2011, atrial fibrillation on coumadin, chronic combined systolic and diastolic CHF, moderate MR, HTN, HLD and history of strokewho presented with chest pain on 10/11 and was found to have elevated troponin consistent with NSTEMI. Also noted to have progressive renal failure with uremia for which nephrology was consulted and began hemodialysis 10/11 through remotely placed left RC AVF. An outpatient HD chair has been arranged.   05/07/2018: Patient seen and examined at his bedside in the dialysis center.  He has no  new complaints.  No acute events overnight.  We will continue to monitor vital signs post hemodialysis today.  05/08/18: No acute events overnight. No new complaints. Denies chest pain, dyspnea or cough.  On the day of discharge, the patient was hemodynamically stable and will need to follow up with his PCP, nephrologist, cardiologist post hospitalization.  Hospital Course:  Principal Problem:   Acute on chronic systolic heart failure (HCC) Active Problems:   CKD (chronic kidney disease), stage V (HCC)   ESRD (end stage renal disease) (HCC)   Leukocytosis   Atypical chest pain   Paroxysmal A-fib (HCC)   NSTEMI (non-ST elevated myocardial infarction) [facility])   Myocardial infarction [facility])   Palliative care by specialist   DNR (do not resuscitate) discussion  NSTEMI Left heart cath on 04/23/2018 showed LAD and RCA lesions. Continue dual antiplatelet therapy, statin and antihypertensive as recommended by cardiology Per Cardiology note, plan would be to continue DAPT x30 days without coumadin, then stop aspirin, continue plavix and restart coumadin  Chronic systolic CHF with LVEF 25 to 30% Will have volume removed by dialysis Continue to monitor on telemetry  Leukocytosis Completed 7 days of IV antibiotics for suspected cellulitis  Right arm edema, hematoma Continue to elevate  New end-stage renal disease on dialysis Monday Wednesday Friday Has outpatient spot second shift upon discharge Nephrology following  Physical debility/ambulatory dysfunction Plan to discharge to SNF  PVD, carotid stenosis status post CEA in 2011 Continue aspirin statin  Hypothyroidism Continue Synthroid  Paroxysmal A. fib Continue amiodarone Not on  anticoagulation due to risk of bleeding  Anemia of chronic disease secondary to advanced kidney failure Aranesp per nephrology No overt sign of bleeding  AVF difficulty cannulation: Patent on U/S 10/20.  HD thru right IJ. Vascular surgery  consulted. Underwent vascular procedure 10/29 where fistula was successfully salvaged  Left arm cellulitis vs. fistula infection: WBC climbed 19 >30, improved with abx Initial blood cultures negative, repeat cultures neg Completed vancomycin, aztreonamfor total of 7 days   Code Status: Full code   Consultants:  Nephrology  Cardiology  Vascular surgery  Procedures:  Left heart cath   Discharge Exam: BP (!) 107/43 (BP Location: Left Leg)   Pulse 77   Temp 98.6 F (37 C) (Oral)   Resp 16   Ht 5\' 6"  (1.676 m)   Wt 71.3 kg   SpO2 96%   BMI 25.37 kg/m  . General: 77 y.o. year-old male well developed well nourished in no acute distress.  Alert and oriented x3. . Cardiovascular: Regular rate and rhythm with no rubs or gallops.  No thyromegaly or JVD noted.   Marland Kitchen Respiratory: Clear to auscultation with no wheezes or rales. Good inspiratory effort. . Abdomen: Soft nontender nondistended with normal bowel sounds x4 quadrants. . Musculoskeletal: No lower extremity edema. 2/4 pulses in all 4 extremities. . Skin: No ulcerative lesions noted or rashes, . Psychiatry: Mood is appropriate for condition and setting  Discharge Instructions You were cared for by a hospitalist during your hospital stay. If you have any questions about your discharge medications or the care you received while you were in the hospital after you are discharged, you can call the unit and asked to speak with the hospitalist on call if the hospitalist that took care of you is not available. Once you are discharged, your primary care physician will handle any further medical issues. Please note that NO REFILLS for any discharge medications will be authorized once you are discharged, as it is imperative that you return to your primary care physician (or establish a relationship with a primary care physician if you do not have one) for your aftercare needs so that they can reassess your need for medications and  monitor your lab values.  Discharge Instructions    AMB Referral to Cardiac Rehabilitation - Phase II   Complete by:  As directed    Diagnosis:  Coronary Stents     Allergies as of 05/08/2018      Reactions   Penicillins Anaphylaxis, Other (See Comments)   Passed out   Tetanus Toxoids Anaphylaxis   Sulfa Antibiotics Other (See Comments)   UNSPECIFIED CHILDHOOD REACTION       Medication List    STOP taking these medications   hydrALAZINE 50 MG tablet Commonly known as:  APRESOLINE   traMADol 50 MG tablet Commonly known as:  ULTRAM   VITAMIN D PO   warfarin 5 MG tablet Commonly known as:  COUMADIN     TAKE these medications   allopurinol 100 MG tablet Commonly known as:  ZYLOPRIM Take 100 mg by mouth daily.   amiodarone 200 MG tablet Commonly known as:  PACERONE Take 1 tablet (200 mg total) by mouth daily.   amLODipine 5 MG tablet Commonly known as:  NORVASC Take 5 mg by mouth at bedtime.   aspirin 81 MG EC tablet Take 1 tablet (81 mg total) by mouth daily. Start taking on:  05/09/2018   atorvastatin 40 MG tablet Commonly known as:  LIPITOR Take 0.5 tablets (20  mg total) by mouth at bedtime.   bacitracin-polymyxin b ophthalmic ointment Commonly known as:  POLYSPORIN Place into both eyes 4 (four) times daily. apply to eye every 12 hours while awake   calcium acetate 667 MG capsule Commonly known as:  PHOSLO Take 1 capsule (667 mg total) by mouth 2 (two) times daily with breakfast and lunch. Start taking on:  05/09/2018   clopidogrel 75 MG tablet Commonly known as:  PLAVIX Take 1 tablet (75 mg total) by mouth daily. Start taking on:  05/09/2018   epoetin alfa 20000 UNIT/ML injection Commonly known as:  EPOGEN,PROCRIT Inject 20,000 Units into the skin every 14 (fourteen) days.   feeding supplement (NEPRO CARB STEADY) Liqd Take 237 mLs by mouth 2 (two) times daily between meals.   isosorbide mononitrate 30 MG 24 hr tablet Commonly known as:   IMDUR TAKE 1 TABLET(30 MG) BY MOUTH DAILY What changed:  See the new instructions.   levothyroxine 50 MCG tablet Commonly known as:  SYNTHROID, LEVOTHROID Take 1 tablet (50 mcg total) by mouth daily before breakfast. What changed:  Another medication with the same name was removed. Continue taking this medication, and follow the directions you see here.   lidocaine-prilocaine cream Commonly known as:  EMLA Apply topically every Monday, Wednesday, and Friday.   multivitamin Tabs tablet Take 1 tablet by mouth at bedtime.   nitroGLYCERIN 0.4 MG SL tablet Commonly known as:  NITROSTAT PLACE 1 TABLET UNDER THE TONGUE EVERY 5 MINUTES AS NEEDED FOR CHEST PAIN. MAX OF 3 DOSES What changed:  See the new instructions.   polyethylene glycol packet Commonly known as:  MIRALAX / GLYCOLAX Take 17 g by mouth daily.   sertraline 100 MG tablet Commonly known as:  ZOLOFT Take 100 mg by mouth daily.   triamcinolone cream 0.1 % Commonly known as:  KENALOG Apply 1 application topically 2 (two) times daily as needed (dry skin).   VITAMIN B-12 PO Take 1 tablet by mouth 2 (two) times daily.   VITAMIN C PO Take 1 tablet by mouth every other day.      Allergies  Allergen Reactions  . Penicillins Anaphylaxis and Other (See Comments)    Passed out  . Tetanus Toxoids Anaphylaxis  . Sulfa Antibiotics Other (See Comments)    UNSPECIFIED CHILDHOOD REACTION     Contact information for follow-up providers    Angelia Mould, MD In 2 weeks.   Specialties:  Vascular Surgery, Cardiology Why:  Office will call you to arrange your appt (sent) Contact information: [place] [address] [phone]            Contact information for after-discharge care    Destination    [facility] .   Service:  Skilled Nursing Contact information: [address] [place] [phone]                   The results of  significant diagnostics from this hospitalization (including imaging, microbiology, ancillary and laboratory) are listed below for reference.    Significant Diagnostic Studies: Dg Chest 2 View  Result Date: 04/24/2018 CLINICAL DATA:  CHF. EXAM: CHEST - 2 VIEW COMPARISON:  11/09/2017. FINDINGS: Cardiomegaly with diffuse bilateral interstitial prominence and bilateral pleural effusions suggesting CHF. Bibasilar atelectasis. Degenerative change thoracic spine. Old healed right fifth rib fracture. IMPRESSION: Cardiomegaly with diffuse bilateral from interstitial prominence and bilateral pleural effusions suggesting CHF. Similar findings noted on prior exam. Electronically Signed  By: [place]ight[place]   On: 04/24/2018 12:52   Dg Chest 2 View  Result Date: 04/18/2018 CLINICAL DATA:  Chest pain and shortness of breath EXAM: CHEST - 2 VIEW COMPARISON:  Nov 09, 2017 FINDINGS: The mediastinal contour is normal. Heart size is enlarged. There is pulmonary edema. There are small bilateral posterior pleural effusions. No focal pneumonia is identified. The visualized skeletal structures are stable. IMPRESSION: Congestive heart failure. Electronically Signed   By: Abelardo Diesel M.D.   On: 04/18/2018 12:44   Ir Fluoro Guide Cv Line Right  Result Date: 05/05/2018 INDICATION: Renal failure EXAM: TEMPORARY DIALYSIS CATHETER PLACEMENT, RIGHT JUGULAR MEDICATIONS: None ANESTHESIA/SEDATION: None. FLUOROSCOPY TIME:  Fluoroscopy Time: 0 minutes 6 seconds (1 mGy). COMPLICATIONS: None immediate. PROCEDURE: Informed written consent was obtained from the patient after a thorough discussion of the procedural risks, benefits and alternatives. All questions were addressed. Maximal Sterile Barrier Technique was utilized including caps, mask, sterile gowns, sterile gloves, sterile drape, hand hygiene and skin antiseptic. A timeout was performed prior to the initiation of the procedure. The right neck was prepped and draped in a  sterile fashion. 1% lidocaine was utilized for local anesthesia. Under sonographic guidance, a micropuncture needle was inserted into the right jugular vein and removed over an 018 wire which was up sized to an Amplatz. Twelve [place] dilator followed by a 12 [place] Trialysis temporary dialysis catheter was inserted over the wire. It was flushed and sewn in place. The right jugular vein was noted to be patent. Sonographic documentation was obtained. FINDINGS: Imaging documents placement of a temporary right jugular dialysis catheter with its tip at the cavoatrial junction. IMPRESSION: Successful temporary right jugular dialysis catheter placement with its tip at the cavoatrial junction. It is ready for use. Electronically Signed   By: Marybelle Killings M.D.   On: 05/05/2018 08:03   Ir US Guide Vasc Access Right  Result Date: 05/05/2018 INDICATION: Renal failure EXAM: TEMPORARY DIALYSIS CATHETER PLACEMENT, RIGHT JUGULAR MEDICATIONS: None ANESTHESIA/SEDATION: None. FLUOROSCOPY TIME:  Fluoroscopy Time: 0 minutes 6 seconds (1 mGy). COMPLICATIONS: None immediate. PROCEDURE: Informed written consent was obtained from the patient after a thorough discussion of the procedural risks, benefits and alternatives. All questions were addressed. Maximal Sterile Barrier Technique was utilized including caps, mask, sterile gowns, sterile gloves, sterile drape, hand hygiene and skin antiseptic. A timeout was performed prior to the initiation of the procedure. The right neck was prepped and draped in a sterile fashion. 1% lidocaine was utilized for local anesthesia. Under sonographic guidance, a micropuncture needle was inserted into the right jugular vein and removed over an 018 wire which was up sized to an Amplatz. Twelve [place] dilator followed by a 12 [place] Trialysis temporary dialysis catheter was inserted over the wire. It was flushed and sewn in place. The right jugular vein was noted to be patent. Sonographic documentation  was obtained. FINDINGS: Imaging documents placement of a temporary right jugular dialysis catheter with its tip at the cavoatrial junction. IMPRESSION: Successful temporary right jugular dialysis catheter placement with its tip at the cavoatrial junction. It is ready for use. Electronically Signed   By: Marybelle Killings M.D.   On: 05/05/2018 08:03   Dg Chest Port 1 View  Result Date: 05/06/2018 CLINICAL DATA:  Dialysis catheter insertion EXAM: PORTABLE CHEST 1 VIEW COMPARISON:  None. FINDINGS: Right IJ dual lumen dialysis catheter is noted with tips in the the mid as well as distal SVC. Cardiomegaly with interstitial edema and small bilateral pleural effusions.  No pneumothorax. Ectatic atherosclerotic aorta. No acute osseous abnormality. IMPRESSION: 1. Cardiomegaly with aortic atherosclerosis. Interstitial edema with small left greater than right pleural effusions. 2. Dialysis catheter from right IJ approach with tips in the mid and distal SVC respectively. No pneumothorax. Electronically Signed   By: Ashley Royalty M.D.   On: 05/06/2018 13:59   Dg Fluoro Guide Cv Line-no Report  Result Date: 05/06/2018 Fluoroscopy was utilized by the requesting physician.  No radiographic interpretation.   Vas [place] Groin Pseudoaneurysm  Result Date: 04/30/2018  ARTERIAL PSEUDOANEURYSM  Exam: Right groin History: S/p catheterization. Performing Technologist: Landry Mellow RDMS, RVT  Examination Guidelines: A complete evaluation includes B-mode imaging, spectral Doppler, color Doppler, and power Doppler as needed of all accessible portions of each vessel. Bilateral testing is considered an integral part of a complete examination. Limited examinations for reoccurring indications may be performed as noted. +------------+----------+---------+------+----------+ Right DuplexPSV (cm/s)Waveform PlaqueComment(s) +------------+----------+---------+------+----------+ CFA            210    triphasic                  +------------+----------+---------+------+----------+ Right Vein comments:CFV patent  Findings: An area with well defined borders measuring 1.4 cm x 0.9 cm was visualized arising off of the CFA with ultrasound characteristics of a pseudoaneurysm. The neck measures approximately 0.3 cm wide and 0.5 cm long.  Summary: Positive for pseudoaneurysm of the right groin.  Diagnosing physician: Curt Jews MD Electronically signed by Curt Jews MD on 04/30/2018 at 70:09:56 PM.   --------------------------------------------------------------------------------    Final    Vas US Duplex Dialysis Access (avf, Avg)  Result Date: 04/27/2018 DIALYSIS ACCESS Reason for Exam: Malfunctioning AVF. Access Site: Left Upper Extremity. Access Type: Radial-cephalic AVF.  Examination Guidelines: A complete evaluation includes B-mode imaging, spectral Doppler, color Doppler, and power Doppler as needed of all accessible portions of each vessel. Unilateral testing is considered an integral part of a complete examination. Limited examinations for reoccurring indications may be performed as noted.  Findings: +--------------------+----------+-----------------+--------+ AVF                 PSV (cm/s)Flow Vol (mL/min)Comments +--------------------+----------+-----------------+--------+ Native artery inflow   110           410                +--------------------+----------+-----------------+--------+ AVF Anastomosis        130           493                +--------------------+----------+-----------------+--------+  +------------+----------+-------------+----------+--------------------+ OUTFLOW VEINPSV (cm/s)Diameter (cm)Depth (cm)      Describe       +------------+----------+-------------+----------+--------------------+ Mid UA          25                                                +------------+----------+-------------+----------+--------------------+ AC Fossa       110        0.29        0.39                         +------------+----------+-------------+----------+--------------------+ Prox Forearm   350        0.38        0.81   Flow Vol: 997 mL/min +------------+----------+-------------+----------+--------------------+ Mid Forearm  0.49        1.78     AVF anastomosis    +------------+----------+-------------+----------+--------------------+   Summary: Patent arteriovenous fistula. *See table(s) above for measurements and observations.  Diagnosing physician: Harold Barban MD Electronically signed by Harold Barban MD on 04/27/2018 at 8:07:58 PM.   --------------------------------------------------------------------------------   Final    Vas [place] Lower Ext Arterial Pseudoaneurysm Compression  Result Date: 05/01/2018  ARTERIAL PSEUDOANEURYSM  Exam: Right groin Performing Technologist: Abram Sander Supporting Technologist: Oda Cogan RDMS, RVT  Examination Guidelines: A complete evaluation includes B-mode imaging, spectral Doppler, color Doppler, and power Doppler as needed of all accessible portions of each vessel. Bilateral testing is considered an integral part of a complete examination. Limited examinations for reoccurring indications may be performed as noted. +------------+----------+--------+------+----------+ Right DuplexPSV (cm/s)WaveformPlaqueComment(s) +------------+----------+--------+------+----------+ CFA             80    biphasic                 +------------+----------+--------+------+----------+ Prox SFA        85    biphasic                 +------------+----------+--------+------+----------+  Summary: Successful closure in the right groin pseudodaneuysm after compression for 15 minutes. Diagnosing physician: Servando Snare MD Electronically signed by Servando Snare MD on 05/01/2018 at 1:08:13 PM.   --------------------------------------------------------------------------------    Final    [place] Rt Upper [place] Soft Tissue Non Vascular  Result  Date: 05/02/2018 CLINICAL DATA:  77 year old with possible hematoma in the right upper extremity. EXAM: ULTRASOUND RIGHT UPPER EXTREMITY LIMITED TECHNIQUE: Ultrasound examination of the upper extremity soft tissues was performed in the area of clinical concern. COMPARISON:  None FINDINGS: Subcutaneous edema at the area of concern. Images are labeled as right upper forearm. A small amount of fluid around vascular structures but this is not a dedicated vascular exam and not clear which vascular structures being imaged. IMPRESSION: Small amount of fluid and subcutaneous edema at the area of concern. Fluid is near vascular structures but not clear which vascular structures are being imaged. Electronically Signed   By: Markus Daft M.D.   On: 05/02/2018 11:38   Vas [place] Lower Extremity Arterial Duplex  Result Date: 05/02/2018 LOWER EXTREMITY ARTERIAL DUPLEX STUDY Indications: Right CFA pseudoaneurysm compression completed 05/01/18.  Current ABI: Not obtained Performing Technologist: Maudry Mayhew MHA, RDMS, RVT, RDCS  Examination Guidelines: A complete evaluation includes B-mode imaging, spectral Doppler, color Doppler, and power Doppler as needed of all accessible portions of each vessel. Bilateral testing is considered an integral part of a complete examination. Limited examinations for reoccurring indications may be performed as noted  Summary: Right: No obvious evidence of pseudoaneurysm involving the right common femoral artery.  See table(s) above for measurements and observations. Electronically signed by Monica Martinez MD on 05/02/2018 at 6:02:35 PM.    Final    Vas [place] Upper Extremity Venous Duplex  Result Date: 04/27/2018 UPPER VENOUS STUDY  Indications: Erythema, and Swelling Performing Technologist: Landry Mellow RDMS, RVT  Examination Guidelines: A complete evaluation includes B-mode imaging, spectral Doppler, color Doppler, and power Doppler as needed of all accessible portions of each vessel.  Bilateral testing is considered an integral part of a complete examination. Limited examinations for reoccurring indications may be performed as noted.  Right Findings: +----------+------------+----------+---------+-----------+---------------------+ RIGHT     CompressiblePropertiesPhasicitySpontaneous       Summary        +----------+------------+----------+---------+-----------+---------------------+ IJV  Full                 Yes       Yes                          +----------+------------+----------+---------+-----------+---------------------+ Subclavian                                          not visualized due to                                                         central line                                                                placement       +----------+------------+----------+---------+-----------+---------------------+ Axillary      Full                 Yes       Yes                          +----------+------------+----------+---------+-----------+---------------------+ Brachial      Full                 Yes       Yes                          +----------+------------+----------+---------+-----------+---------------------+ Radial        Full                                                        +----------+------------+----------+---------+-----------+---------------------+ Ulnar         Full                                                        +----------+------------+----------+---------+-----------+---------------------+ Cephalic      Full                                                        +----------+------------+----------+---------+-----------+---------------------+ Basilic       Full                                                        +----------+------------+----------+---------+-----------+---------------------+  Summary:  Right: No evidence of deep vein thrombosis in the upper extremity.  No evidence of superficial vein thrombosis in the upper extremity.  *See table(s) above for measurements and observations.  Diagnosing physician: Harold Barban MD Electronically signed by Harold Barban MD on 04/27/2018 at 51:07:16 PM.    Final     Microbiology: No results found for this or any previous visit (from the past 240 hour(s)).   Labs: Basic Metabolic Panel: Recent Labs  Lab 05/02/18 0836 05/05/18 1303 05/06/18 0334 05/07/18 0741  NA 138 140 138 141  K 3.4* 3.6 3.5 3.9  CL 102 104 103 102  CO2 26 27 26 27   GLUCOSE 117* 120* 94 115*  BUN 38* 56* 24* 43*  CREATININE 4.95* 6.11* 3.65* 5.13*  CALCIUM 7.6* 8.1* 7.8* 7.9*  PHOS 4.4 3.3  --  5.0*   Liver Function Tests: Recent Labs  Lab 05/02/18 0836 05/05/18 1303 05/07/18 0741  ALBUMIN 2.4* 2.3* 2.2*   No results for input(s): LIPASE, AMYLASE in the last 168 hours. No results for input(s): AMMONIA in the last 168 hours. CBC: Recent Labs  Lab 05/04/18 0752 05/05/18 0643 05/06/18 0334 05/07/18 0741 05/08/18 0814  WBC 13.8* 13.7* 13.7* 13.2* 12.1*  HGB 9.3* 9.6* 8.9* 7.8* 9.0*  HCT 30.4* 30.7* 29.6* 26.9* 31.1*  MCV 98.7 99.7 100.0 104.7* 103.7*  PLT 322 342 366 333 395   Cardiac Enzymes: No results for input(s): CKTOTAL, CKMB, CKMBINDEX, TROPONINI in the last 168 hours. BNP: BNP (last 3 results) Recent Labs    04/18/18 1200  BNP >4,500.0*    ProBNP (last 3 results) No results for input(s): PROBNP in the last 8760 hours.  CBG: No results for input(s): GLUCAP in the last 168 hours.     Signed:  Kayleen Memos, MD [facility] 05/08/2018, 3:56 PM

## 2018-05-08 NOTE — Progress Notes (Signed)
Patient will discharge to [facility] Anticipated discharge date: 05/08/18 Family notified: Rolene Course, daughter Transportation by: [facility]  Nurse to call report to [phone]. Patient will go to room 113B at the facility.  CSW signing off.  Estanislado Emms, [place]  Clinical Social Worker

## 2018-05-08 NOTE — Clinical Social Work Placement (Signed)
   CLINICAL SOCIAL WORK PLACEMENT  NOTE  Date:  05/08/2018  Patient Details  Name: JERAMIA SALEEBY MRN: [id number] Date of Birth: [birth year]  Clinical Social Work is seeking post-discharge placement for this patient at the [facility] [place] level of care (*CSW will initial, date and re-position this form in  chart as items are completed):  Yes   Patient/family provided with [place] Work Department's list of facilities offering this level of care within the geographic area requested by the patient (or if unable, by the patient's family).  Yes   Patient/family informed of their freedom to choose among providers that offer the needed level of care, that participate in Medicare, Medicaid or managed care program needed by the patient, have an available bed and are willing to accept the patient.  Yes   Patient/family informed of [place]'s ownership interest in [facility] and [facility], as well as of the fact that they are under no obligation to receive care at these facilities.  PASRR submitted to EDS on 04/22/18     PASRR number received on 04/22/18     Existing PASRR number confirmed on       FL2 transmitted to all facilities in geographic area requested by pt/family on 04/22/18     FL2 transmitted to all facilities within larger geographic area on       Patient informed that his/her managed care company has contracts with or will negotiate with certain facilities, including the following:  [facility])         Patient/family informed of bed offers received.  Patient chooses bed at [facility])     Physician recommends and patient chooses bed at      Patient to be transferred to [facility]) on 05/08/18.  Patient to be transferred to facility by PTAR     Patient family notified on 05/08/18 of transfer.  Name of family member notified:   Rolene Course, daughter     PHYSICIAN Please prepare priority discharge summary, including medications, Please prepare prescriptions     Additional Comment:    _______________________________________________ Estanislado Emms, LCSW 05/08/2018, 3:09 PM

## 2018-05-08 NOTE — Progress Notes (Signed)
[facility] Progress Note    Assessment/ Plan:   Pt is a45 y.o.yo malewho was admitted on 10/11/2019with new start to HD- hosp complicated by worsening cardiac status/Afib, cath showing CAD s/pintervention- also poorly functioning AVF   1. Renal- new start to HD- now running MWF- has OP spot at [facility] MWF second shift upon D/C. Access issues- bleeding from AVF.  Seen on HD Wed Next HD Fri  - Appreciate Dr. 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 Pt is very pleased but will not be able to use the fistula for a minimum of 8weeks per [facility].  2. CAD/afib/CM-cath showing CAD- s/p stenting to LAD per cards - plavix 3. Anemia- CKD and ongoing bleeding - ESA and iron being given- drifting down- gave blood 10/23 with HD and 10/25 for hgb of 7.5--- isnow9.3. 4. Secondary hyperparathyroidism- PTH 63- I think was on vit D as OP, none here. - phos now 5 -> will start phoslo BID w/ meals (breakfast and lunch for now); may eventually need to change frequency. 5. HTN/volume- BP soft, amiodarone- not on any BP meds  Subjective:   Mild discomfort in the right arm which is swollen. O/w denies f/c/n/v/dyspnea  Minimal discomfort in surgical sites.  Wondering when he can go home.   Objective:   BP (!) 105/46 (BP Location: Left Leg)   Pulse 73   Temp 97.6 F (36.4 C) (Oral)   Resp 16   Ht 5\' 6"  (1.676 m)   Wt 71.3 kg   SpO2 98%   BMI 25.37 kg/m   Intake/Output Summary (Last 24 hours) at 05/08/2018 1149 Last data filed at 05/08/2018 0900 Gross per 24 hour  Intake 720 ml  Output 200 ml  Net 520 ml   Weight change: -0.036 kg  Physical Exam: General: alert, pleasant Heart: brady Lungs: mostly clear Abdomen: soft, non tender Extremities: min edema Dialysis Access: right sided TC, good bruit in left RC AVF with 2 surgical wounds  Imaging: Dg Chest Port 1 View  Result Date:  05/06/2018 CLINICAL DATA:  Dialysis catheter insertion EXAM: PORTABLE CHEST 1 VIEW COMPARISON:  None. FINDINGS: Right IJ dual lumen dialysis catheter is noted with tips in the the mid as well as distal SVC. Cardiomegaly with interstitial edema and small bilateral pleural effusions. No pneumothorax. Ectatic atherosclerotic aorta. No acute osseous abnormality. IMPRESSION: 1. Cardiomegaly with aortic atherosclerosis. Interstitial edema with small left greater than right pleural effusions. 2. Dialysis catheter from right IJ approach with tips in the mid and distal SVC respectively. No pneumothorax. Electronically Signed   By: Ashley Royalty M.D.   On: 05/06/2018 13:59   Dg Fluoro Guide Cv Line-no Report  Result Date: 05/06/2018 Fluoroscopy was utilized by the requesting physician.  No radiographic interpretation.    Labs: BMET Recent Labs  Lab 05/02/18 0836 05/05/18 1303 05/06/18 0334 05/07/18 0741  NA 138 140 138 141  K 3.4* 3.6 3.5 3.9  CL 102 104 103 102  CO2 26 27 26 27   GLUCOSE 117* 120* 94 115*  BUN 38* 56* 24* 43*  CREATININE 4.95* 6.11* 3.65* 5.13*  CALCIUM 7.6* 8.1* 7.8* 7.9*  PHOS 4.4 3.3  --  5.0*   CBC Recent Labs  Lab 05/05/18 0643 05/06/18 0334 05/07/18 0741 05/08/18 0814  WBC 13.7* 13.7* 13.2* 12.1*  HGB 9.6* 8.9* 7.8* 9.0*  HCT 30.7* 29.6* 26.9* 31.1*  MCV 99.7 100.0 104.7* 103.7*  PLT 342 366 333 395    Medications:    . amiodarone  200 mg Oral Daily  . aspirin EC  81 mg Oral Daily  . atorvastatin  20 mg Oral QHS  . bacitracin-polymyxin b   Both Eyes QID  . calcium acetate  667 mg Oral BID WC  . Chlorhexidine Gluconate Cloth  6 each Topical Q0600  . clopidogrel  75 mg Oral Daily  . Darbepoetin Alfa  200 mcg Intravenous Q Fri-HD  . feeding supplement (NEPRO CARB STEADY)  237 mL Oral BID BM  . feeding supplement (PRO-STAT SUGAR FREE 64)  30 mL Oral Daily  . levothyroxine  50 mcg Oral QAC breakfast  . lidocaine-prilocaine   Topical Q M,W,F  . multivitamin   1 tablet Oral QHS  . sodium chloride flush  3 mL Intravenous Q12H      Otelia Santee, MD 05/08/2018, 11:49 AM

## 2018-05-12 ENCOUNTER — Telehealth (HOSPITAL_COMMUNITY): Payer: Self-pay

## 2018-05-12 ENCOUNTER — Other Ambulatory Visit: Payer: Self-pay

## 2018-05-12 DIAGNOSIS — N186 End stage renal disease: Secondary | ICD-10-CM

## 2018-05-12 NOTE — Telephone Encounter (Signed)
Per phrase I "do not call".  Closed referral

## 2018-05-21 ENCOUNTER — Encounter (HOSPITAL_COMMUNITY): Payer: Medicare Other

## 2018-05-21 ENCOUNTER — Encounter: Payer: Medicare Other | Admitting: Vascular Surgery

## 2018-05-30 ENCOUNTER — Encounter: Payer: Medicare Other | Admitting: Family

## 2018-05-30 ENCOUNTER — Encounter (HOSPITAL_COMMUNITY): Payer: Medicare Other

## 2018-06-02 ENCOUNTER — Encounter: Payer: Self-pay | Admitting: Family

## 2018-06-10 ENCOUNTER — Other Ambulatory Visit: Payer: Self-pay | Admitting: Family Medicine

## 2018-06-10 ENCOUNTER — Ambulatory Visit
Admission: RE | Admit: 2018-06-10 | Discharge: 2018-06-10 | Disposition: A | Discharge status: Home / Self Care | Payer: Medicare Other | Source: Ambulatory Visit | Attending: Family Medicine | Admitting: Family Medicine

## 2018-06-10 DIAGNOSIS — R0600 Dyspnea, unspecified: Secondary | ICD-10-CM

## 2018-06-10 IMAGING — CR DG CHEST 2V
2 series · 2 of 2 positions shown · non-contrast
Comparison: [DATE] and earlier.

CLINICAL DATA: 77-year-old male with shortness of breath for 1
month. Stent placement in [REDACTED]. Dialysis.

EXAM:
CHEST - 2 VIEW

[w chest pa]
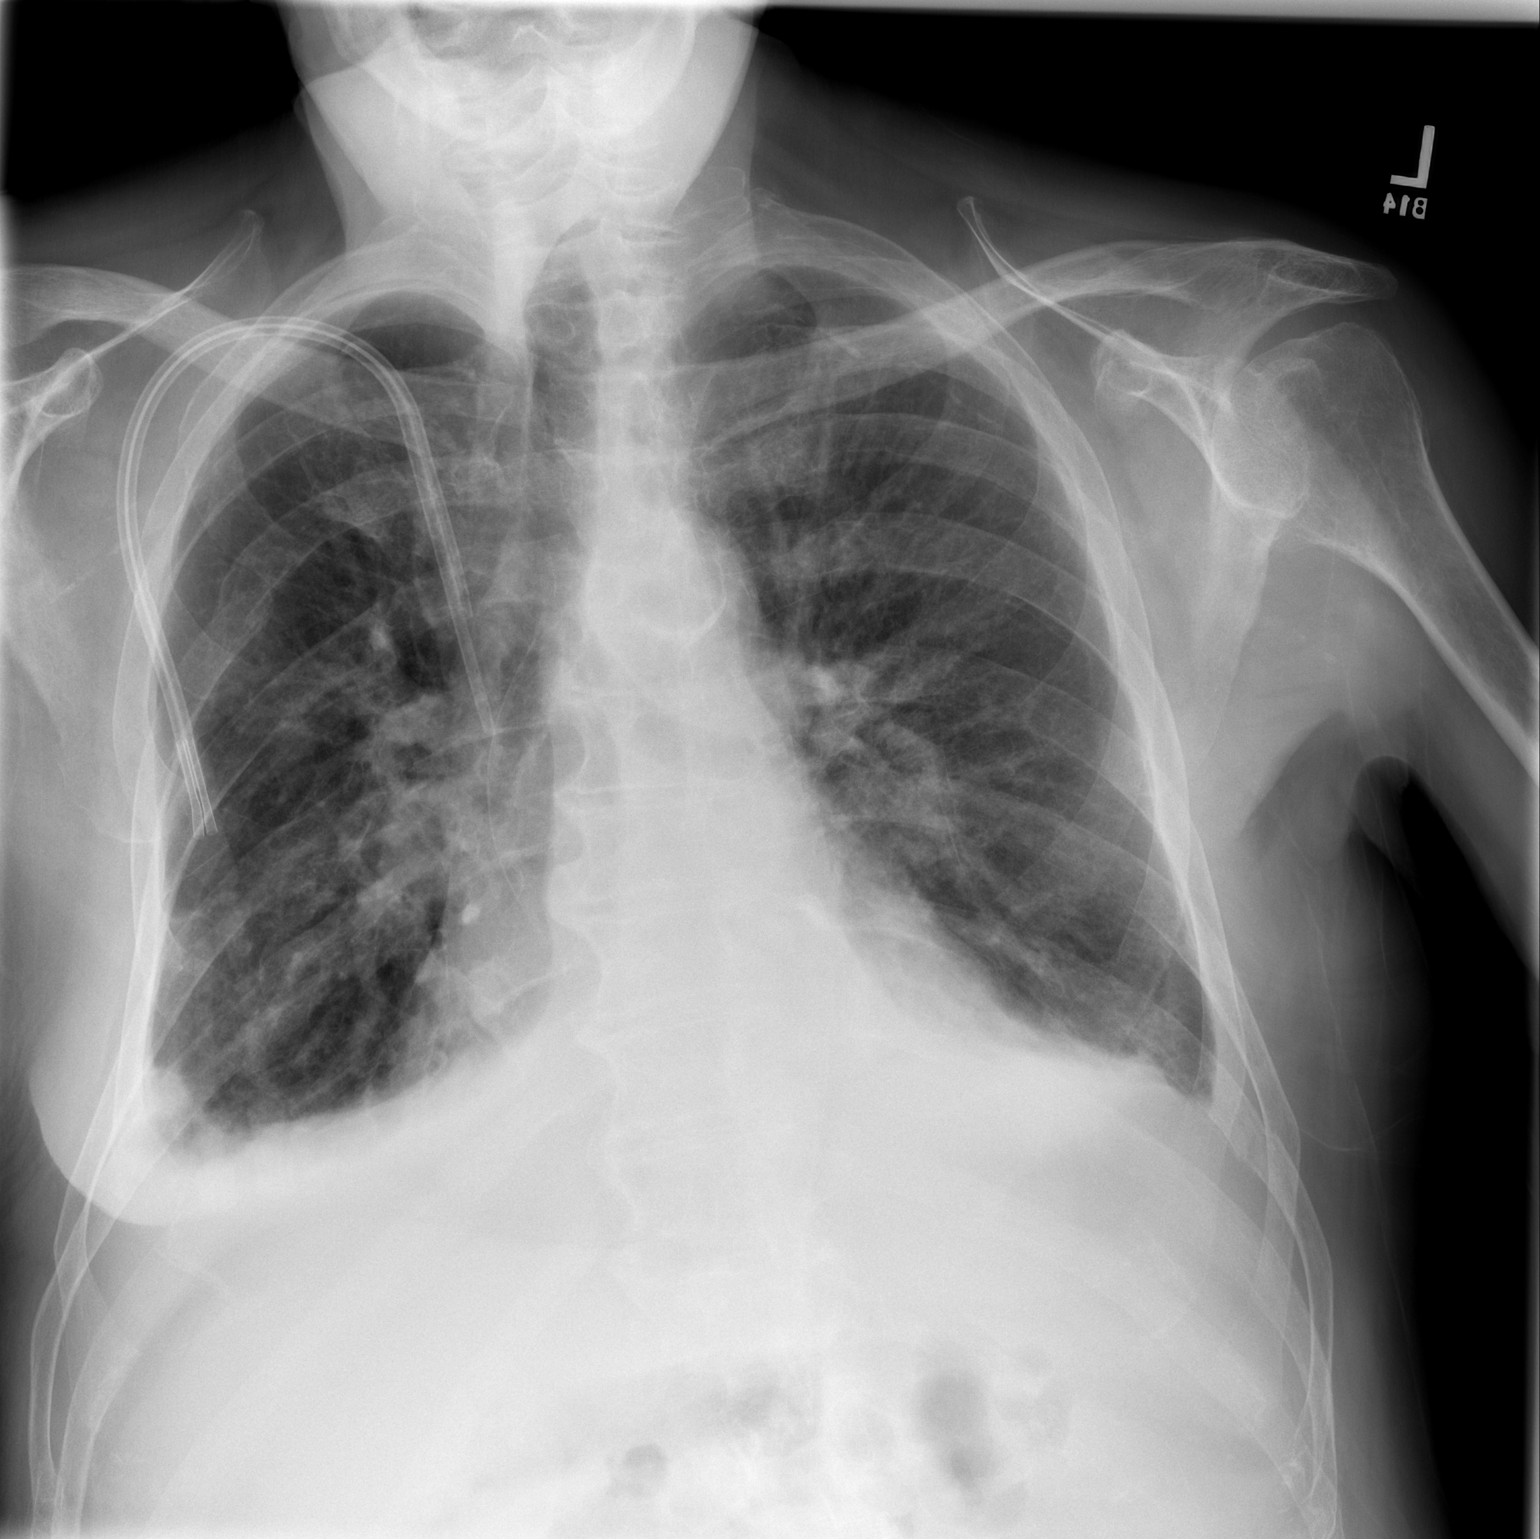

[w chest lat]
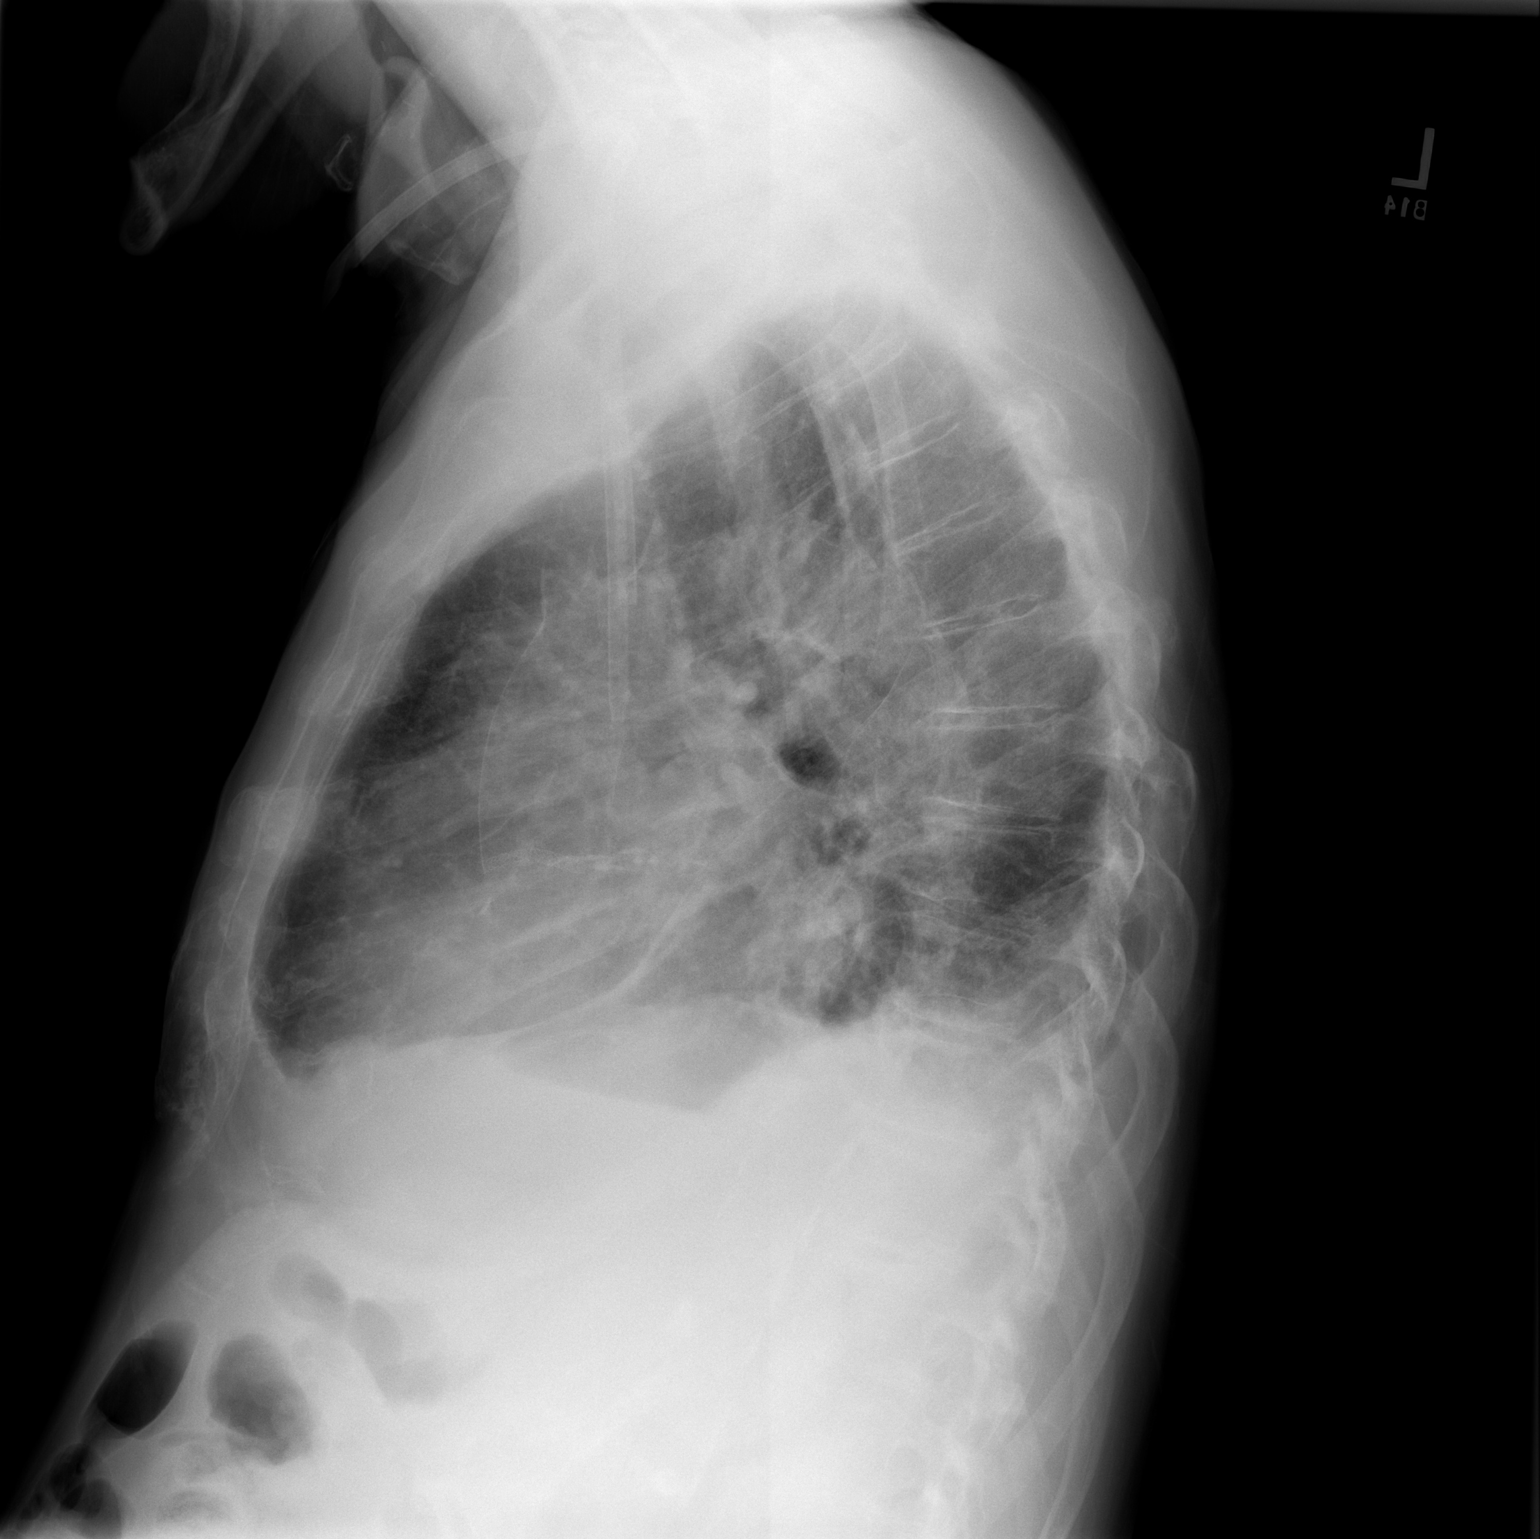

[2 of 2 positions shown; findings below may reference images not displayed]

FINDINGS: Stable right chest dual lumen dialysis catheter. Small bilateral
pleural effusions persist although pulmonary interstitial edema has
resolved since [REDACTED]. Mild cardiomegaly. Other mediastinal
contours are within normal limits. Visualized tracheal air column is
within normal limits. No pneumothorax. Bibasilar atelectasis with no
air bronchograms/consolidation.

Nipple shadow suspected at the right lateral lung base.

Abdominal Calcified aortic atherosclerosis. No acute osseous
abnormality identified. Negative visible bowel gas pattern.
IMPRESSION: 1. Resolved pulmonary interstitial edema since [REDACTED] but
persistent small pleural effusions.
2. No new cardiopulmonary abnormality.
3.  Aortic Atherosclerosis ([E8]-[E8]).

## 2018-06-12 ENCOUNTER — Ambulatory Visit (INDEPENDENT_AMBULATORY_CARE_PROVIDER_SITE_OTHER): Payer: Self-pay | Admitting: Family

## 2018-06-12 ENCOUNTER — Ambulatory Visit: Payer: Medicare Other | Admitting: Physician Assistant

## 2018-06-12 ENCOUNTER — Encounter: Payer: Self-pay | Admitting: Family

## 2018-06-12 VITALS — BP 123/60 | HR 70 | Temp 97.1°F | Resp 14 | Ht 68.0 in | Wt 156.0 lb

## 2018-06-12 DIAGNOSIS — Z992 Dependence on renal dialysis: Secondary | ICD-10-CM

## 2018-06-12 DIAGNOSIS — Z9889 Other specified postprocedural states: Secondary | ICD-10-CM

## 2018-06-12 DIAGNOSIS — I6523 Occlusion and stenosis of bilateral carotid arteries: Secondary | ICD-10-CM

## 2018-06-12 DIAGNOSIS — N186 End stage renal disease: Secondary | ICD-10-CM

## 2018-06-12 NOTE — Patient Instructions (Signed)
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
## 2018-06-17 ENCOUNTER — Other Ambulatory Visit: Payer: Self-pay | Admitting: Physician Assistant

## 2018-06-24 ENCOUNTER — Encounter: Payer: Self-pay | Admitting: Cardiology

## 2018-06-24 ENCOUNTER — Ambulatory Visit (INDEPENDENT_AMBULATORY_CARE_PROVIDER_SITE_OTHER): Payer: Medicare Other | Admitting: Cardiology

## 2018-06-24 VITALS — BP 110/52 | HR 82 | Ht 68.0 in | Wt 155.0 lb

## 2018-06-24 DIAGNOSIS — I2510 Atherosclerotic heart disease of native coronary artery without angina pectoris: Secondary | ICD-10-CM | POA: Diagnosis not present

## 2018-06-24 DIAGNOSIS — I5023 Acute on chronic systolic (congestive) heart failure: Secondary | ICD-10-CM

## 2018-06-24 DIAGNOSIS — E7800 Pure hypercholesterolemia, unspecified: Secondary | ICD-10-CM | POA: Diagnosis not present

## 2018-06-24 DIAGNOSIS — I480 Paroxysmal atrial fibrillation: Secondary | ICD-10-CM

## 2018-06-24 LAB — CBC
Hematocrit: 31.7 % — ABNORMAL LOW (ref 37.5–51.0)
Hemoglobin: 11.1 g/dL — ABNORMAL LOW (ref 13.0–17.7)
MCH: 33.2 pg — AB (ref 26.6–33.0)
MCHC: 35 g/dL (ref 31.5–35.7)
MCV: 95 fL (ref 79–97)
PLATELETS: 301 10*3/uL (ref 150–450)
RBC: 3.34 x10E6/uL — AB (ref 4.14–5.80)
RDW: 15.8 % — AB (ref 12.3–15.4)
WBC: 11.1 10*3/uL — ABNORMAL HIGH (ref 3.4–10.8)

## 2018-06-24 LAB — TSH: TSH: 12.79 u[IU]/mL — ABNORMAL HIGH (ref 0.450–4.500)

## 2018-06-24 LAB — HEPATIC FUNCTION PANEL
ALK PHOS: 125 IU/L — AB (ref 39–117)
ALT: 19 IU/L (ref 0–44)
AST: 26 IU/L (ref 0–40)
Albumin: 3.9 g/dL (ref 3.5–4.8)
BILIRUBIN TOTAL: 0.5 mg/dL (ref 0.0–1.2)
BILIRUBIN, DIRECT: 0.18 mg/dL (ref 0.00–0.40)
Total Protein: 6.8 g/dL (ref 6.0–8.5)
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
## 2018-06-24 NOTE — Patient Instructions (Signed)
Medication Instructions:   STOP AMLODIPINE  If you need a refill on your cardiac medications before your next appointment, please call your pharmacy.   Lab work:  Your physician recommends that you HAVE LAB WORK TODAY  If you have labs (blood work) drawn today and your tests are completely normal, you will receive your results only by: Marland Kitchen MyChart Message (if you have MyChart) OR . A paper copy in the mail If you have any lab test that is abnormal or we need to change your treatment, we will call you to review the results.  Follow-Up: Your physician recommends that you schedule a follow-up appointment in: [place]  Your physician recommends that you schedule a follow-up appointment in: [place]

## 2018-06-25 ENCOUNTER — Telehealth: Payer: Self-pay | Admitting: *Deleted

## 2018-06-25 DIAGNOSIS — R7989 Other specified abnormal findings of blood chemistry: Secondary | ICD-10-CM

## 2018-06-25 NOTE — Telephone Encounter (Signed)
-----   Message from Lelon Perla, MD sent at 06/25/2018  7:25 AM EST ----- Change levothyroxine to 75 micrograms daily Kirk Ruths

## 2018-06-25 NOTE — Telephone Encounter (Signed)
Left message for pt to call.

## 2018-06-27 MED ORDER — LEVOTHYROXINE SODIUM 75 MCG PO TABS: 75.0000 ug | ORAL_TABLET | Freq: Every day | ORAL | 3 refills | Status: AC

## 2018-06-27 NOTE — Telephone Encounter (Signed)
Spoke with pt, aware of results and medication change. New script sent to the pharmacy  

## 2018-07-01 ENCOUNTER — Ambulatory Visit: Payer: Medicare Other | Admitting: Physician Assistant

## 2018-07-08 ENCOUNTER — Ambulatory Visit: Payer: Medicare Other | Admitting: Cardiology

## 2018-07-08 ENCOUNTER — Ambulatory Visit: Payer: Medicare Other | Admitting: Physician Assistant

## 2018-07-08 DIAGNOSIS — Z9861 Coronary angioplasty status: Secondary | ICD-10-CM

## 2018-07-08 DIAGNOSIS — I2510 Atherosclerotic heart disease of native coronary artery without angina pectoris: Secondary | ICD-10-CM | POA: Insufficient documentation

## 2018-07-08 DIAGNOSIS — I255 Ischemic cardiomyopathy: Secondary | ICD-10-CM | POA: Insufficient documentation

## 2018-07-18 ENCOUNTER — Other Ambulatory Visit: Payer: Self-pay | Admitting: Family

## 2018-07-18 DIAGNOSIS — I6523 Occlusion and stenosis of bilateral carotid arteries: Secondary | ICD-10-CM

## 2018-07-21 ENCOUNTER — Encounter: Payer: Self-pay | Admitting: Cardiology
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
## 2018-08-12 ENCOUNTER — Other Ambulatory Visit: Payer: Self-pay | Admitting: *Deleted

## 2018-08-12 ENCOUNTER — Encounter: Payer: Self-pay | Admitting: *Deleted

## 2018-08-12 ENCOUNTER — Encounter: Payer: Self-pay | Admitting: Family

## 2018-08-12 ENCOUNTER — Ambulatory Visit: Payer: Medicare Other | Admitting: Physician Assistant

## 2018-08-12 ENCOUNTER — Other Ambulatory Visit: Payer: Self-pay

## 2018-08-12 ENCOUNTER — Ambulatory Visit (HOSPITAL_COMMUNITY)
Admission: RE | Admit: 2018-08-12 | Discharge: 2018-08-12 | Disposition: A | Discharge status: Home / Self Care | Payer: Medicare Other | Source: Ambulatory Visit | Attending: Family | Admitting: Family

## 2018-08-12 VITALS — BP 155/65 | HR 63 | Temp 97.5°F | Resp 18 | Ht 68.0 in | Wt 155.0 lb

## 2018-08-12 DIAGNOSIS — I6523 Occlusion and stenosis of bilateral carotid arteries: Secondary | ICD-10-CM | POA: Insufficient documentation

## 2018-08-12 DIAGNOSIS — N186 End stage renal disease: Secondary | ICD-10-CM

## 2018-08-28 ENCOUNTER — Encounter (HOSPITAL_COMMUNITY): Payer: Self-pay | Admitting: Vascular Surgery

## 2018-08-28 ENCOUNTER — Ambulatory Visit (HOSPITAL_COMMUNITY)
Admission: RE | Disposition: A | Discharge status: Home / Self Care | Payer: Self-pay | Source: Ambulatory Visit | Attending: Vascular Surgery

## 2018-08-28 ENCOUNTER — Other Ambulatory Visit: Payer: Self-pay

## 2018-08-28 ENCOUNTER — Ambulatory Visit (HOSPITAL_COMMUNITY)
Admission: RE | Admit: 2018-08-28 | Discharge: 2018-08-28 | Disposition: A | Discharge status: Home / Self Care | Payer: Medicare Other | Source: Ambulatory Visit | Attending: Vascular Surgery | Admitting: Vascular Surgery

## 2018-08-28 DIAGNOSIS — Y841 Kidney dialysis as the cause of abnormal reaction of the patient, or of later complication, without mention of misadventure at the time of the procedure: Secondary | ICD-10-CM | POA: Diagnosis not present

## 2018-08-28 DIAGNOSIS — I480 Paroxysmal atrial fibrillation: Secondary | ICD-10-CM | POA: Insufficient documentation

## 2018-08-28 DIAGNOSIS — Z8673 Personal history of transient ischemic attack (TIA), and cerebral infarction without residual deficits: Secondary | ICD-10-CM | POA: Insufficient documentation

## 2018-08-28 DIAGNOSIS — Z7982 Long term (current) use of aspirin: Secondary | ICD-10-CM | POA: Diagnosis not present

## 2018-08-28 DIAGNOSIS — Z955 Presence of coronary angioplasty implant and graft: Secondary | ICD-10-CM | POA: Diagnosis not present

## 2018-08-28 DIAGNOSIS — T82510A Breakdown (mechanical) of surgically created arteriovenous fistula, initial encounter: Secondary | ICD-10-CM | POA: Insufficient documentation

## 2018-08-28 DIAGNOSIS — F17290 Nicotine dependence, other tobacco product, uncomplicated: Secondary | ICD-10-CM | POA: Diagnosis not present

## 2018-08-28 DIAGNOSIS — Z79899 Other long term (current) drug therapy: Secondary | ICD-10-CM | POA: Diagnosis not present

## 2018-08-28 DIAGNOSIS — Z880 Allergy status to penicillin: Secondary | ICD-10-CM | POA: Insufficient documentation

## 2018-08-28 DIAGNOSIS — E785 Hyperlipidemia, unspecified: Secondary | ICD-10-CM | POA: Insufficient documentation

## 2018-08-28 DIAGNOSIS — T82898A Other specified complication of vascular prosthetic devices, implants and grafts, initial encounter: Secondary | ICD-10-CM | POA: Diagnosis not present

## 2018-08-28 DIAGNOSIS — Z7901 Long term (current) use of anticoagulants: Secondary | ICD-10-CM | POA: Diagnosis not present

## 2018-08-28 DIAGNOSIS — M1990 Unspecified osteoarthritis, unspecified site: Secondary | ICD-10-CM | POA: Diagnosis not present

## 2018-08-28 DIAGNOSIS — Z992 Dependence on renal dialysis: Secondary | ICD-10-CM | POA: Diagnosis not present

## 2018-08-28 DIAGNOSIS — I6523 Occlusion and stenosis of bilateral carotid arteries: Secondary | ICD-10-CM | POA: Diagnosis not present

## 2018-08-28 DIAGNOSIS — I132 Hypertensive heart and chronic kidney disease with heart failure and with stage 5 chronic kidney disease, or end stage renal disease: Secondary | ICD-10-CM | POA: Insufficient documentation

## 2018-08-28 DIAGNOSIS — Z7902 Long term (current) use of antithrombotics/antiplatelets: Secondary | ICD-10-CM | POA: Diagnosis not present

## 2018-08-28 DIAGNOSIS — N186 End stage renal disease: Secondary | ICD-10-CM | POA: Diagnosis not present

## 2018-08-28 DIAGNOSIS — M109 Gout, unspecified: Secondary | ICD-10-CM | POA: Insufficient documentation

## 2018-08-28 DIAGNOSIS — Z882 Allergy status to sulfonamides status: Secondary | ICD-10-CM | POA: Insufficient documentation

## 2018-08-28 DIAGNOSIS — I5032 Chronic diastolic (congestive) heart failure: Secondary | ICD-10-CM | POA: Insufficient documentation

## 2018-08-28 DIAGNOSIS — I252 Old myocardial infarction: Secondary | ICD-10-CM | POA: Insufficient documentation

## 2018-08-28 HISTORY — PX: A/V FISTULAGRAM: CATH118298

## 2018-08-28 LAB — POCT I-STAT 4, (NA,K, GLUC, HGB,HCT)
Glucose, Bld: 92 mg/dL (ref 70–99)
HCT: 35 % — ABNORMAL LOW (ref 39.0–52.0)
Hemoglobin: 11.9 g/dL — ABNORMAL LOW (ref 13.0–17.0)
Potassium: 4.8 mmol/L (ref 3.5–5.1)
Sodium: 140 mmol/L (ref 135–145)

## 2018-08-28 LAB — POCT I-STAT CREATININE: Creatinine, Ser: 5.2 mg/dL — ABNORMAL HIGH (ref 0.61–1.24)

## 2018-08-28 SURGERY — A/V FISTULAGRAM
Anesthesia: LOCAL

## 2018-08-28 MED ORDER — LIDOCAINE HCL (PF) 1 % IJ SOLN
INTRAMUSCULAR | Status: AC
  Filled 2018-08-28: qty 30

## 2018-08-28 MED ORDER — ACETAMINOPHEN 325 MG PO TABS
650.0000 mg | ORAL_TABLET | Freq: Four times a day (QID) | ORAL | Status: DC | PRN
  Administered 2018-08-28: 650 mg via ORAL
  Filled 2018-08-28: qty 2

## 2018-08-28 MED ORDER — SODIUM CHLORIDE 0.9 % IV SOLN: 250.0000 mL | INTRAVENOUS | Status: DC | PRN

## 2018-08-28 MED ORDER — SODIUM CHLORIDE 0.9% FLUSH: 3.0000 mL | INTRAVENOUS | Status: DC | PRN

## 2018-08-28 MED ORDER — SODIUM CHLORIDE 0.9% FLUSH: 3.0000 mL | Freq: Two times a day (BID) | INTRAVENOUS | Status: DC

## 2018-08-28 MED ORDER — HEPARIN (PORCINE) IN NACL 1000-0.9 UT/500ML-% IV SOLN
INTRAVENOUS | Status: AC
  Filled 2018-08-28: qty 500

## 2018-08-28 SURGICAL SUPPLY — 9 items
BAG SNAP BAND KOVER 36X36 (MISCELLANEOUS) ×2 IMPLANT
COVER DOME SNAP 22 D (MISCELLANEOUS) ×2 IMPLANT
KIT MICROPUNCTURE NIT STIFF (SHEATH) ×1 IMPLANT
PROTECTION STATION PRESSURIZED (MISCELLANEOUS) ×2
SHEATH PROBE COVER 6X72 (BAG) ×3 IMPLANT
STATION PROTECTION PRESSURIZED (MISCELLANEOUS) ×1 IMPLANT
STOPCOCK MORSE 400PSI 3WAY (MISCELLANEOUS) ×2 IMPLANT
TRAY PV CATH (CUSTOM PROCEDURE TRAY) ×2 IMPLANT
TUBING CIL FLEX 10 FLL-RA (TUBING) ×2 IMPLANT

## 2018-08-28 NOTE — Discharge Instructions (Signed)

## 2018-08-28 NOTE — Op Note (Signed)
    OPERATIVE NOTE   PROCEDURE: 1. left radiocephalic arteriovenous fistula cannulation under ultrasound guidance 2. left arm fistulogram including central venogram  PRE-OPERATIVE DIAGNOSIS: Malfunctioning left radiocephalic arteriovenous fistula  POST-OPERATIVE DIAGNOSIS: same as above   SURGEON: Marty Heck, MD  ANESTHESIA: local  ESTIMATED BLOOD LOSS: 5 cc  FINDING(S): 1. Patient has a left radiocephalic fistula at the wrist.  The fistula was patent and drains into the basilic system in the upper arm.  The basilic vein as well as all central venous structures were widely patent.  The cephalic vein portion of the fistula in the distal wrist has 2 focal stenosis that are tandem greater than 95% and the vein appeared sclerotic with a adjacent pseudoaneurysm near the arterial anastomosis.  Given that this portion of the fistula has undergone two previous open revisions by Dr. Scot Dock with patch angioplasty I did not feel this was amendable to angioplasty and would best be treated with new fistula placement in the left arm.  SPECIMEN(S):  None  CONTRAST: 20 cc  INDICATIONS: Troy [name] is a 78 y.o. male who  presents with malfunctioning left arteriovenous fistula.  The patient is scheduled for left arm fistulogram.  The patient is aware the risks include but are not limited to: bleeding, infection, thrombosis of the cannulated access, and possible anaphylactic reaction to the contrast.  The patient is aware of the risks of the procedure and elects to proceed forward.  DESCRIPTION: After full informed written consent was obtained, the patient was brought back to the angiography suite and placed supine upon the angiography table.  The patient was connected to monitoring equipment.  The left arm was prepped and draped in the standard fashion for a left arm fistulogram.  Under ultrasound guidance, the left  radiocephalic arteriovenous fistula was cannulated with a micropuncture  needle.  The microwire was advanced into the fistula and the needle was exchanged for the a microsheath, which was lodged 2 cm into the access.  The wire was removed and the sheath was connected to the IV extension tubing.  Hand injections were completed to image the access from the mid forearm up to the level of axilla.  The central venous structures were also imaged by hand injections.  Blood pressure cuff was inflated on the upper arm in order to get a reflux shot of the distal forearm based portion of the fistula and the arterial anastomosis.  Based on the images, this patient will need: new access given findings noted above.  A 4-0 Monocryl purse-string suture was sewn around the sheath.  The sheath was removed while tying down the suture.  A sterile bandage was applied to the puncture site.  COMPLICATIONS: None  CONDITION: None  Marty Heck, MD [facility] [place] Office: [phone] Pager: [phone]  08/28/2018 10:43 AM
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
## 2018-08-29 ENCOUNTER — Telehealth: Payer: Self-pay | Admitting: *Deleted

## 2018-08-29 ENCOUNTER — Encounter: Payer: Self-pay | Admitting: *Deleted

## 2018-08-29 MED FILL — Lidocaine HCl Local Preservative Free (PF) Inj 1%: INTRAMUSCULAR | Qty: 30 | Status: AC

## 2018-08-29 MED FILL — Heparin Sod (Porcine)-NaCl IV Soln 1000 Unit/500ML-0.9%: INTRAVENOUS | Qty: 500 | Status: AC

## 2018-08-29 NOTE — Telephone Encounter (Signed)
Confirmed with Caren Griffins at [place] pre-op letter received and she will review with patient.

## 2018-08-29 NOTE — Progress Notes (Signed)
Pre op instruction letter faxed to [facility] at [address] [facility]. She will review and give to patient.

## 2018-09-02 ENCOUNTER — Other Ambulatory Visit: Payer: Self-pay

## 2018-09-02 ENCOUNTER — Encounter (HOSPITAL_COMMUNITY): Payer: Self-pay | Admitting: *Deleted
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
## 2018-09-02 NOTE — Progress Notes (Signed)
   09/02/18 1337  OBSTRUCTIVE SLEEP APNEA  Have you ever been diagnosed with sleep apnea through a sleep study? No  Do you snore loudly (loud enough to be heard through closed doors)?  1  Do you often feel tired, fatigued, or sleepy during the daytime (such as falling asleep during driving or talking to someone)? 0  Has anyone observed you stop breathing during your sleep? 1  Do you have, or are you being treated for high blood pressure? 1  BMI more than 35 kg/m2? 0  Age > 50 (1-yes) 1  Neck circumference greater than:Male 16 inches or larger, Male 17inches or larger? 0  Male Gender (Yes=1) 1  Obstructive Sleep Apnea Score 5

## 2018-09-02 NOTE — Progress Notes (Signed)
SDW-Pre-op call completed by pt daughter, Caren Griffins [place]lyn [place]) [place]. Daughter denies that pt C/O SOB and chest pain. Daughter stated that pt is under the care of Dr. Stanford Breed , Cardiology. Daughter made aware to have pt stop taking vitamins, fish oil, Melatonin and herbal medications. Do not take any NSAIDs ie: Ibuprofen, Advil, Naproxen (Aleve), Motrin, BC and Goody Powder. Daughter stated that pt last dose of Plavix was Sunday. Daughter verbalized understanding of all pre-op instructions.

## 2018-09-02 NOTE — Anesthesia Preprocedure Evaluation (Addendum)
Anesthesia Evaluation  Patient identified by MRN, date of birth, ID band Patient awake    Reviewed: Allergy & Precautions, NPO status , Patient's Chart, lab work & pertinent test results  Airway Mallampati: I  TM Distance: >3 FB Neck ROM: Full    Dental  (+) Edentulous Upper, Edentulous Lower   Pulmonary former smoker,    Pulmonary exam normal        Cardiovascular hypertension, Pt. on medications + CAD and + Past MI  Normal cardiovascular exam     Neuro/Psych Depression Dementia CVA    GI/Hepatic GERD  Medicated and Controlled,  Endo/Other    Renal/GU ESRF and DialysisRenal disease     Musculoskeletal   Abdominal   Peds  Hematology   Anesthesia Other Findings   Reproductive/Obstetrics                           Anesthesia Physical Anesthesia Plan  ASA: III  Anesthesia Plan: MAC   Post-op Pain Management:    Induction: Intravenous  PONV Risk Score and Plan: 1 and Treatment may vary due to age or medical condition and Ondansetron  Airway Management Planned: Simple Face Mask  Additional Equipment:   Intra-op Plan:   Post-operative Plan:   Informed Consent: I have reviewed the patients History and Physical, chart, labs and discussed the procedure including the risks, benefits and alternatives for the proposed anesthesia with the patient or authorized representative who has indicated his/her understanding and acceptance.       Plan Discussed with: CRNA and Surgeon  Anesthesia Plan Comments: (See PAT note by Karoline Caldwell, PA-C )       Anesthesia Quick Evaluation

## 2018-09-04 ENCOUNTER — Encounter (HOSPITAL_COMMUNITY)
Admission: RE | Disposition: A | Discharge status: Home / Self Care | Payer: Self-pay | Source: Home / Self Care | Attending: Vascular Surgery

## 2018-09-04 ENCOUNTER — Ambulatory Visit (HOSPITAL_COMMUNITY): Payer: Medicare Other | Admitting: Physician Assistant

## 2018-09-04 ENCOUNTER — Encounter (HOSPITAL_COMMUNITY): Payer: Self-pay

## 2018-09-04 ENCOUNTER — Ambulatory Visit (HOSPITAL_COMMUNITY)
Admission: RE | Admit: 2018-09-04 | Discharge: 2018-09-04 | Disposition: A | Discharge status: Home / Self Care | Payer: Medicare Other | Attending: Vascular Surgery | Admitting: Vascular Surgery

## 2018-09-04 DIAGNOSIS — I440 Atrioventricular block, first degree: Secondary | ICD-10-CM | POA: Insufficient documentation

## 2018-09-04 DIAGNOSIS — E785 Hyperlipidemia, unspecified: Secondary | ICD-10-CM | POA: Insufficient documentation

## 2018-09-04 DIAGNOSIS — Z87891 Personal history of nicotine dependence: Secondary | ICD-10-CM | POA: Insufficient documentation

## 2018-09-04 DIAGNOSIS — Z7982 Long term (current) use of aspirin: Secondary | ICD-10-CM | POA: Diagnosis not present

## 2018-09-04 DIAGNOSIS — M109 Gout, unspecified: Secondary | ICD-10-CM | POA: Insufficient documentation

## 2018-09-04 DIAGNOSIS — Z955 Presence of coronary angioplasty implant and graft: Secondary | ICD-10-CM | POA: Diagnosis not present

## 2018-09-04 DIAGNOSIS — I2510 Atherosclerotic heart disease of native coronary artery without angina pectoris: Secondary | ICD-10-CM | POA: Diagnosis not present

## 2018-09-04 DIAGNOSIS — J90 Pleural effusion, not elsewhere classified: Secondary | ICD-10-CM | POA: Insufficient documentation

## 2018-09-04 DIAGNOSIS — Z8673 Personal history of transient ischemic attack (TIA), and cerebral infarction without residual deficits: Secondary | ICD-10-CM | POA: Insufficient documentation

## 2018-09-04 DIAGNOSIS — F0390 Unspecified dementia without behavioral disturbance: Secondary | ICD-10-CM | POA: Diagnosis not present

## 2018-09-04 DIAGNOSIS — Z7902 Long term (current) use of antithrombotics/antiplatelets: Secondary | ICD-10-CM | POA: Insufficient documentation

## 2018-09-04 DIAGNOSIS — Z79899 Other long term (current) drug therapy: Secondary | ICD-10-CM | POA: Insufficient documentation

## 2018-09-04 DIAGNOSIS — Z887 Allergy status to serum and vaccine status: Secondary | ICD-10-CM | POA: Insufficient documentation

## 2018-09-04 DIAGNOSIS — T82858A Stenosis of vascular prosthetic devices, implants and grafts, initial encounter: Secondary | ICD-10-CM | POA: Insufficient documentation

## 2018-09-04 DIAGNOSIS — E039 Hypothyroidism, unspecified: Secondary | ICD-10-CM | POA: Diagnosis not present

## 2018-09-04 DIAGNOSIS — K219 Gastro-esophageal reflux disease without esophagitis: Secondary | ICD-10-CM | POA: Insufficient documentation

## 2018-09-04 DIAGNOSIS — Z882 Allergy status to sulfonamides status: Secondary | ICD-10-CM | POA: Insufficient documentation

## 2018-09-04 DIAGNOSIS — M1990 Unspecified osteoarthritis, unspecified site: Secondary | ICD-10-CM | POA: Diagnosis not present

## 2018-09-04 DIAGNOSIS — Z7901 Long term (current) use of anticoagulants: Secondary | ICD-10-CM | POA: Insufficient documentation

## 2018-09-04 DIAGNOSIS — N186 End stage renal disease: Secondary | ICD-10-CM | POA: Insufficient documentation

## 2018-09-04 DIAGNOSIS — T82898A Other specified complication of vascular prosthetic devices, implants and grafts, initial encounter: Secondary | ICD-10-CM | POA: Diagnosis not present

## 2018-09-04 DIAGNOSIS — Z87442 Personal history of urinary calculi: Secondary | ICD-10-CM | POA: Insufficient documentation

## 2018-09-04 DIAGNOSIS — I132 Hypertensive heart and chronic kidney disease with heart failure and with stage 5 chronic kidney disease, or end stage renal disease: Secondary | ICD-10-CM | POA: Diagnosis not present

## 2018-09-04 DIAGNOSIS — D631 Anemia in chronic kidney disease: Secondary | ICD-10-CM | POA: Insufficient documentation

## 2018-09-04 DIAGNOSIS — Z9842 Cataract extraction status, left eye: Secondary | ICD-10-CM | POA: Diagnosis not present

## 2018-09-04 DIAGNOSIS — I6523 Occlusion and stenosis of bilateral carotid arteries: Secondary | ICD-10-CM | POA: Insufficient documentation

## 2018-09-04 DIAGNOSIS — I272 Pulmonary hypertension, unspecified: Secondary | ICD-10-CM | POA: Insufficient documentation

## 2018-09-04 DIAGNOSIS — Z992 Dependence on renal dialysis: Secondary | ICD-10-CM | POA: Insufficient documentation

## 2018-09-04 DIAGNOSIS — I700 Atherosclerosis of aorta: Secondary | ICD-10-CM | POA: Insufficient documentation

## 2018-09-04 DIAGNOSIS — I480 Paroxysmal atrial fibrillation: Secondary | ICD-10-CM | POA: Insufficient documentation

## 2018-09-04 DIAGNOSIS — I5032 Chronic diastolic (congestive) heart failure: Secondary | ICD-10-CM | POA: Insufficient documentation

## 2018-09-04 DIAGNOSIS — Z9841 Cataract extraction status, right eye: Secondary | ICD-10-CM | POA: Diagnosis not present

## 2018-09-04 DIAGNOSIS — Z8249 Family history of ischemic heart disease and other diseases of the circulatory system: Secondary | ICD-10-CM | POA: Insufficient documentation

## 2018-09-04 DIAGNOSIS — Z8371 Family history of colonic polyps: Secondary | ICD-10-CM | POA: Insufficient documentation

## 2018-09-04 DIAGNOSIS — F329 Major depressive disorder, single episode, unspecified: Secondary | ICD-10-CM | POA: Insufficient documentation

## 2018-09-04 DIAGNOSIS — I252 Old myocardial infarction: Secondary | ICD-10-CM | POA: Insufficient documentation

## 2018-09-04 HISTORY — DX: Gastro-esophageal reflux disease without esophagitis: K21.9

## 2018-09-04 HISTORY — PX: AV FISTULA PLACEMENT: SHX1204

## 2018-09-04 HISTORY — DX: Unspecified hearing loss, unspecified ear: H91.90

## 2018-09-04 HISTORY — DX: Presence of dental prosthetic device (complete) (partial): Z97.2

## 2018-09-04 HISTORY — DX: Major depressive disorder, single episode, unspecified: F32.9

## 2018-09-04 HISTORY — DX: Presence of spectacles and contact lenses: Z97.3

## 2018-09-04 HISTORY — DX: Unspecified dementia, unspecified severity, without behavioral disturbance, psychotic disturbance, mood disturbance, and anxiety: F03.90

## 2018-09-04 HISTORY — DX: Atherosclerotic heart disease of native coronary artery without angina pectoris: I25.10

## 2018-09-04 HISTORY — DX: Depression, unspecified: F32.A

## 2018-09-04 HISTORY — DX: Acute myocardial infarction, unspecified: I21.9

## 2018-09-04 HISTORY — DX: Hypothyroidism, unspecified: E03.9

## 2018-09-04 HISTORY — DX: Cardiac murmur, unspecified: R01.1

## 2018-09-04 LAB — POCT I-STAT 4, (NA,K, GLUC, HGB,HCT)
Glucose, Bld: 93 mg/dL (ref 70–99)
HEMATOCRIT: 43 % (ref 39.0–52.0)
Hemoglobin: 14.6 g/dL (ref 13.0–17.0)
POTASSIUM: 4.5 mmol/L (ref 3.5–5.1)
Sodium: 138 mmol/L (ref 135–145)

## 2018-09-04 SURGERY — ARTERIOVENOUS (AV) FISTULA CREATION
Anesthesia: Monitor Anesthesia Care | Site: Arm Lower | Laterality: Left

## 2018-09-04 MED ORDER — SODIUM CHLORIDE 0.9 % IV SOLN
INTRAVENOUS | Status: DC | PRN
  Administered 2018-09-04: 500 mL

## 2018-09-04 MED ORDER — SODIUM CHLORIDE 0.9 % IV SOLN
INTRAVENOUS | Status: AC
  Filled 2018-09-04: qty 1.2

## 2018-09-04 MED ORDER — PROTAMINE SULFATE 10 MG/ML IV SOLN
INTRAVENOUS | Status: AC
  Filled 2018-09-04: qty 5

## 2018-09-04 MED ORDER — LIDOCAINE 2% (20 MG/ML) 5 ML SYRINGE
INTRAMUSCULAR | Status: AC
  Filled 2018-09-04: qty 5

## 2018-09-04 MED ORDER — LIDOCAINE 2% (20 MG/ML) 5 ML SYRINGE
INTRAMUSCULAR | Status: DC | PRN
  Administered 2018-09-04: 40 mg via INTRAVENOUS

## 2018-09-04 MED ORDER — DEXAMETHASONE SODIUM PHOSPHATE 10 MG/ML IJ SOLN
INTRAMUSCULAR | Status: AC
  Filled 2018-09-04: qty 1

## 2018-09-04 MED ORDER — VANCOMYCIN HCL IN DEXTROSE 1-5 GM/200ML-% IV SOLN
1000.0000 mg | INTRAVENOUS | Status: AC
  Administered 2018-09-04: 1000 mg via INTRAVENOUS

## 2018-09-04 MED ORDER — DEXAMETHASONE SODIUM PHOSPHATE 4 MG/ML IJ SOLN
INTRAMUSCULAR | Status: DC | PRN
  Administered 2018-09-04: 6 mg via INTRAVENOUS

## 2018-09-04 MED ORDER — 0.9 % SODIUM CHLORIDE (POUR BTL) OPTIME
TOPICAL | Status: DC | PRN
  Administered 2018-09-04: 1000 mL

## 2018-09-04 MED ORDER — HEMOSTATIC AGENTS (NO CHARGE) OPTIME
TOPICAL | Status: DC | PRN
  Administered 2018-09-04: 1 via TOPICAL

## 2018-09-04 MED ORDER — HYDROCODONE-ACETAMINOPHEN 5-325 MG PO TABS: 1.0000 | ORAL_TABLET | ORAL | 0 refills | Status: AC | PRN

## 2018-09-04 MED ORDER — LIDOCAINE HCL 1 % IJ SOLN
INTRAMUSCULAR | Status: DC | PRN
  Administered 2018-09-04: 5 mL

## 2018-09-04 MED ORDER — HYDROMORPHONE HCL 1 MG/ML IJ SOLN
INTRAMUSCULAR | Status: AC
  Filled 2018-09-04: qty 1

## 2018-09-04 MED ORDER — ONDANSETRON HCL 4 MG/2ML IJ SOLN
INTRAMUSCULAR | Status: AC
  Filled 2018-09-04: qty 2

## 2018-09-04 MED ORDER — FENTANYL CITRATE (PF) 100 MCG/2ML IJ SOLN
INTRAMUSCULAR | Status: DC | PRN
  Administered 2018-09-04: 50 ug via INTRAVENOUS

## 2018-09-04 MED ORDER — VANCOMYCIN HCL 1000 MG IV SOLR
INTRAVENOUS | Status: DC | PRN
  Administered 2018-09-04: 1000 mg via INTRAVENOUS

## 2018-09-04 MED ORDER — FENTANYL CITRATE (PF) 250 MCG/5ML IJ SOLN
INTRAMUSCULAR | Status: AC
  Filled 2018-09-04: qty 5

## 2018-09-04 MED ORDER — HEPARIN SODIUM (PORCINE) 1000 UNIT/ML IJ SOLN
INTRAMUSCULAR | Status: AC
  Filled 2018-09-04: qty 1

## 2018-09-04 MED ORDER — PROPOFOL 10 MG/ML IV BOLUS
INTRAVENOUS | Status: AC
  Filled 2018-09-04: qty 20

## 2018-09-04 MED ORDER — ONDANSETRON HCL 4 MG/2ML IJ SOLN
INTRAMUSCULAR | Status: DC | PRN
  Administered 2018-09-04: 4 mg via INTRAVENOUS

## 2018-09-04 MED ORDER — VANCOMYCIN HCL IN DEXTROSE 1-5 GM/200ML-% IV SOLN
INTRAVENOUS | Status: AC
  Administered 2018-09-04: 1000 mg via INTRAVENOUS
  Filled 2018-09-04: qty 200

## 2018-09-04 MED ORDER — LIDOCAINE-EPINEPHRINE (PF) 1 %-1:200000 IJ SOLN
INTRAMUSCULAR | Status: AC
  Filled 2018-09-04: qty 30

## 2018-09-04 MED ORDER — MEPERIDINE HCL 50 MG/ML IJ SOLN: 6.2500 mg | INTRAMUSCULAR | Status: DC | PRN

## 2018-09-04 MED ORDER — SODIUM CHLORIDE 0.9 % IV SOLN
INTRAVENOUS | Status: DC | PRN
  Administered 2018-09-04: 40 ug/min via INTRAVENOUS

## 2018-09-04 MED ORDER — PHENYLEPHRINE 40 MCG/ML (10ML) SYRINGE FOR IV PUSH (FOR BLOOD PRESSURE SUPPORT)
PREFILLED_SYRINGE | INTRAVENOUS | Status: AC
  Filled 2018-09-04: qty 10

## 2018-09-04 MED ORDER — DIPHENHYDRAMINE HCL 50 MG/ML IJ SOLN
INTRAMUSCULAR | Status: AC
  Filled 2018-09-04: qty 1

## 2018-09-04 MED ORDER — PROPOFOL 10 MG/ML IV BOLUS
INTRAVENOUS | Status: DC | PRN
  Administered 2018-09-04: 20 mg via INTRAVENOUS

## 2018-09-04 MED ORDER — PROPOFOL 500 MG/50ML IV EMUL
INTRAVENOUS | Status: DC | PRN
  Administered 2018-09-04: 75 ug/kg/min via INTRAVENOUS

## 2018-09-04 MED ORDER — HYDROMORPHONE HCL 1 MG/ML IJ SOLN
0.2500 mg | INTRAMUSCULAR | Status: DC | PRN
  Administered 2018-09-04: 0.25 mg via INTRAVENOUS

## 2018-09-04 MED ORDER — ONDANSETRON HCL 4 MG/2ML IJ SOLN: 4.0000 mg | Freq: Once | INTRAMUSCULAR | Status: DC | PRN

## 2018-09-04 MED ORDER — DIPHENHYDRAMINE HCL 50 MG/ML IJ SOLN
INTRAMUSCULAR | Status: DC | PRN
  Administered 2018-09-04: 12.5 mg via INTRAVENOUS

## 2018-09-04 MED ORDER — PROTAMINE SULFATE 10 MG/ML IV SOLN
INTRAVENOUS | Status: DC | PRN
  Administered 2018-09-04 (×2): 5 mg via INTRAVENOUS

## 2018-09-04 MED ORDER — HEPARIN SODIUM (PORCINE) 1000 UNIT/ML IJ SOLN
INTRAMUSCULAR | Status: DC | PRN
  Administered 2018-09-04: 3000 [IU] via INTRAVENOUS

## 2018-09-04 MED ORDER — LIDOCAINE HCL (PF) 1 % IJ SOLN
INTRAMUSCULAR | Status: AC
  Filled 2018-09-04: qty 30

## 2018-09-04 MED ORDER — SODIUM CHLORIDE 0.9 % IV SOLN
INTRAVENOUS | Status: DC | PRN
  Administered 2018-09-04: 06:00:00 via INTRAVENOUS

## 2018-09-04 SURGICAL SUPPLY — 43 items
ADH SKN CLS APL DERMABOND .7 (GAUZE/BANDAGES/DRESSINGS) ×1
AGENT HMST SPONGE THK3/8 (HEMOSTASIS)
ARMBAND PINK RESTRICT EXTREMIT (MISCELLANEOUS) ×4 IMPLANT
CANISTER SUCT 3000ML PPV (MISCELLANEOUS) ×2 IMPLANT
CLIP VESOCCLUDE MED 6/CT (CLIP) ×2 IMPLANT
CLIP VESOCCLUDE SM WIDE 6/CT (CLIP) ×3 IMPLANT
COVER PROBE W GEL 5X96 (DRAPES) ×2 IMPLANT
COVER WAND RF STERILE (DRAPES) ×2 IMPLANT
DECANTER SPIKE VIAL GLASS SM (MISCELLANEOUS) ×2 IMPLANT
DERMABOND ADVANCED (GAUZE/BANDAGES/DRESSINGS) ×1
DERMABOND ADVANCED .7 DNX12 (GAUZE/BANDAGES/DRESSINGS) ×1 IMPLANT
DRESSING OPSITE X SMALL 2X3 (GAUZE/BANDAGES/DRESSINGS) ×1 IMPLANT
DRSG MEPILEX BORDER 4X4 (GAUZE/BANDAGES/DRESSINGS) ×1 IMPLANT
ELECT REM PT RETURN 9FT ADLT (ELECTROSURGICAL) ×2
ELECTRODE REM PT RTRN 9FT ADLT (ELECTROSURGICAL) ×1 IMPLANT
GLOVE BIO SURGEON STRL SZ 6.5 (GLOVE) ×1 IMPLANT
GLOVE BIO SURGEON STRL SZ7.5 (GLOVE) ×2 IMPLANT
GLOVE BIOGEL PI IND STRL 6.5 (GLOVE) IMPLANT
GLOVE BIOGEL PI IND STRL 8 (GLOVE) ×1 IMPLANT
GLOVE BIOGEL PI INDICATOR 6.5 (GLOVE) ×3
GLOVE BIOGEL PI INDICATOR 8 (GLOVE) ×1
GLOVE INDICATOR 6.5 STRL GRN (GLOVE) ×1 IMPLANT
GLOVE INDICATOR 7.0 STRL GRN (GLOVE) ×1 IMPLANT
GOWN STRL NON-REIN LRG LVL3 (GOWN DISPOSABLE) ×1 IMPLANT
GOWN STRL REUS W/ TWL LRG LVL3 (GOWN DISPOSABLE) ×2 IMPLANT
GOWN STRL REUS W/ TWL XL LVL3 (GOWN DISPOSABLE) ×2 IMPLANT
GOWN STRL REUS W/TWL LRG LVL3 (GOWN DISPOSABLE) ×6
GOWN STRL REUS W/TWL XL LVL3 (GOWN DISPOSABLE) ×2
HEMOSTAT SNOW SURGICEL 2X4 (HEMOSTASIS) ×1 IMPLANT
HEMOSTAT SPONGE AVITENE ULTRA (HEMOSTASIS) IMPLANT
KIT BASIN OR (CUSTOM PROCEDURE TRAY) ×2 IMPLANT
KIT TURNOVER KIT B (KITS) ×2 IMPLANT
NS IRRIG 1000ML POUR BTL (IV SOLUTION) ×2 IMPLANT
PACK CV ACCESS (CUSTOM PROCEDURE TRAY) ×2 IMPLANT
PAD ARMBOARD 7.5X6 YLW CONV (MISCELLANEOUS) ×4 IMPLANT
SUT MNCRL AB 4-0 PS2 18 (SUTURE) ×2 IMPLANT
SUT PROLENE 6 0 BV (SUTURE) ×5 IMPLANT
SUT PROLENE 7 0 BV 1 (SUTURE) IMPLANT
SUT VIC AB 3-0 SH 27 (SUTURE) ×2
SUT VIC AB 3-0 SH 27X BRD (SUTURE) ×1 IMPLANT
TOWEL GREEN STERILE (TOWEL DISPOSABLE) ×2 IMPLANT
UNDERPAD 30X30 (UNDERPADS AND DIAPERS) ×2 IMPLANT
WATER STERILE IRR 1000ML POUR (IV SOLUTION) ×2 IMPLANT

## 2018-09-04 NOTE — Transfer of Care (Signed)
Immediate Anesthesia Transfer of Care Note  Patient: Troy [name]  Procedure(s) Performed: Creation of Left arm ARTERIOVENOUS  FISTULA (Left Arm Lower)  Patient Location: PACU  Anesthesia Type:MAC  Level of Consciousness: awake and patient cooperative  Airway & Oxygen Therapy: Patient Spontanous Breathing and Patient connected to face mask oxygen  Post-op Assessment: Report given to RN, Post -op Vital signs reviewed and stable and Patient moving all extremities X 4  Post vital signs: Reviewed and stable  Last Vitals:  Vitals Value Taken Time  BP    Temp    Pulse 63 09/04/2018  9:18 AM  Resp 18 09/04/2018  9:18 AM  SpO2 100 % 09/04/2018  9:18 AM  Vitals shown include unvalidated device data.  Last Pain:  Vitals:   09/04/18 0545  TempSrc: Oral  PainSc: 0-No pain      Patients Stated Pain Goal: 4 (86/76/72 0947)  Complications: No apparent anesthesia complications

## 2018-09-04 NOTE — Discharge Instructions (Signed)
° °  [facility] ° °Discharge Instructions ° °AV Fistula or Graft Surgery for Dialysis Access ° °Please refer to the following instructions for your post-procedure care. Your surgeon or physician assistant will discuss any changes with you. ° °Activity ° °You may drive the day following your surgery, if you are comfortable and no longer taking prescription pain medication. Resume full activity as the soreness in your incision resolves. ° °Bathing/Showering ° °You may shower after you go home. Keep your incision dry for 48 hours. Do not soak in a bathtub, hot tub, or swim until the incision heals completely. You may not shower if you have a hemodialysis catheter. ° °Incision Care ° °Clean your incision with mild soap and water after 48 hours. Pat the area dry with a clean towel. You do not need a bandage unless otherwise instructed. Do not apply any ointments or creams to your incision. You may have skin glue on your incision. Do not peel it off. It will come off on its own in about one week. Your arm may swell a bit after surgery. To reduce swelling use pillows to elevate your arm so it is above your heart. Your doctor will tell you if you need to lightly wrap your arm with an ACE bandage. ° °Diet ° °Resume your normal diet. There are not special food restrictions following this procedure. In order to heal from your surgery, it is CRITICAL to get adequate nutrition. Your body requires vitamins, minerals, and protein. Vegetables are the best source of vitamins and minerals. Vegetables also provide the perfect balance of protein. Processed food has little nutritional value, so try to avoid this. ° °Medications ° °Resume taking all of your medications. If your incision is causing pain, you may take over-the counter pain relievers such as acetaminophen (Tylenol). If you were prescribed a stronger pain medication, please be aware these medications can cause nausea and constipation. Prevent  nausea by taking the medication with a snack or meal. Avoid constipation by drinking plenty of fluids and eating foods with high amount of fiber, such as fruits, vegetables, and grains. Do not take Tylenol if you are taking prescription pain medications. ° ° ° ° °Follow up °Your surgeon may want to see you in the office following your access surgery. If so, this will be arranged at the time of your surgery. ° °Please call us immediately for any of the following conditions: ° °Increased pain, redness, drainage (pus) from your incision site °Fever of 101 degrees or higher °Severe or worsening pain at your incision site °Hand pain or numbness. ° °Reduce your risk of vascular disease: ° °Stop smoking. If you would like help, call [facility] at 1-800-QUIT-NOW (1-800-784-8669) or [facility] at 336-586-4000 ° °Manage your cholesterol °Maintain a desired weight °Control your diabetes °Keep your blood pressure down ° °Dialysis ° °It will take several weeks to several months for your new dialysis access to be ready for use. Your surgeon will determine when it is OK to use it. Your nephrologist will continue to direct your dialysis. You can continue to use your Permcath until your new access is ready for use. ° °If you have any questions, please call the office at [phone]. ° °

## 2018-09-04 NOTE — Anesthesia Postprocedure Evaluation (Signed)
Anesthesia Post Note  Patient: Troy [name]  Procedure(s) Performed: Creation of Left arm ARTERIOVENOUS  FISTULA (Left Arm Lower)     Patient location during evaluation: PACU Anesthesia Type: MAC Level of consciousness: awake and alert Pain management: pain level controlled Vital Signs Assessment: post-procedure vital signs reviewed and stable Respiratory status: spontaneous breathing, nonlabored ventilation, respiratory function stable and patient connected to nasal cannula oxygen Cardiovascular status: stable and blood pressure returned to baseline Postop Assessment: no apparent nausea or vomiting Anesthetic complications: no    Last Vitals:  Vitals:   09/04/18 0947 09/04/18 1023  BP:  (!) 125/42  Pulse: 60 (!) 58  Resp: 13 14  Temp:  36.5 C  SpO2: 99% 99%    Last Pain:  Vitals:   09/04/18 0943  TempSrc:   PainSc: 2                  Tonique Mendonca DAVID
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
## 2018-09-04 NOTE — Op Note (Signed)
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 Marty Heck, MD  Assistant: Rene Kocher, NP  Indications: Patient is a 78 year old male who recently presented with a poorly functioning left radiocephalic fistula that was placed years ago at the wrist.  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  Last week he underwent left upper extremity fistulogram that showed severely stenotic cephalic vein at the wrist.  I subsequently recommended revision to a more proximal radiocephalic fistula in the mid forearm.  He presents today after recent benefits were discussed.  Findings: The cephalic vein was identified in the mid forearm proximal to the stenotic cephalic vein where it was dilated to greater than 5 mm and emptied into the basilic system.  The radial artery was moderately diseased here but greater than 3 mm.  As a result a new radiocephalic fistula was created in the mid forearm with a great thrill and although it drains into the basilic vein appears superficial enough for dialysis access.  Anesthesia: MAC  Details: The patient was taken to the operating room after informed consent was obtained.  He was placed on the operating table in supine position.  His left arm was prepped and draped in usual sterile fashion.  Prep timeout was performed to identify patient, procedure and site.  Initially used ultrasound to identify the diseased segment of the cephalic vein where it was severely stenotic on fistulogram at the wrist and the healthy segment was marked in the mid to distal forearm.  Ultimately also identified the radial artery at this level.  Performed a longitudinal  incision and initially dissected down to the cephalic vein that was completely mobilized with Metzenbaum scissors.  Underneath this we had to dissect deep to several of the tendons in the forearm and identified the radial artery there was greater than 3 mm but circumferentially diseased.  There was a great pulse in the artery.  Ultimately the patient was then given 3000 units of IV heparin.  The cephalic vein was then controlled distally with a right angle clamp and transected and then tied off with a 2-0 silk tie.  I then oversewed this distal stump with a 6-0 Prolene suture and placed a medium vessel clip.  The remaining cephalic vein accepted up to a 4 mm dilator with no resistance.  It was flushed easily with heparinized saline.  I then control this with a bulldog and reset my exposure on the radial artery.  The artery was then opened longitudinally with 11 blade after being controlled proximally distally with Vesseloops and extended with Potts scissors.  I then spatulated the end of the vein and sewed a end-to-side radiocephalic fistula in the mid forearm with 6-0 Prolene in running fashion.  The artery and vein were de-aired before completion of the anastomosis.  At completion there was excellent thrill in the fistula and a palpable radial pulse.  I did have to place one additional patch stitch in the toe of the anastomosis.  Surgicel snow was used for hemostasis.  The wound was then copiously irrigated with saline until the effluent was clear.  All of our Vesseloops and bulldog clamps were removed.  The wound was then closed with a 3-0 Vicryl in running fashion for the subcutaneous  tissue.  The skin was closed with a running 4-0 Monocryl.  He tolerated the procedure without any apparent complications.  Condition: Stable  Complications: None  Marty Heck, MD [facility] [place] Office: [phone] Pager: [place]

## 2018-09-04 NOTE — Anesthesia Procedure Notes (Addendum)
Procedure Name: MAC Date/Time: 09/04/2018 7:50 AM Performed by: Orlie Dakin, CRNA Pre-anesthesia Checklist: Patient identified, Emergency Drugs available, Suction available and Patient being monitored Patient Re-evaluated:Patient Re-evaluated prior to induction Oxygen Delivery Method: Simple face mask Preoxygenation: Pre-oxygenation with 100% oxygen

## 2018-09-05 ENCOUNTER — Encounter (HOSPITAL_COMMUNITY): Payer: Self-pay | Admitting: Vascular Surgery

## 2018-09-05 ENCOUNTER — Telehealth: Payer: Self-pay | Admitting: Vascular Surgery

## 2018-09-05 NOTE — Telephone Encounter (Signed)
-----   Message from Marylee Floras, NP sent at 09/04/2018  9:28 AM EST ----- L forearm fistula creation, follow up PA clinic 5-6wks with duplex.

## 2018-09-05 NOTE — Telephone Encounter (Signed)
sch appt spk to pt daughter mld ltr 10/09/2018 2pm Dialysis Duplex 3pm p/o PA

## 2018-09-07 ENCOUNTER — Other Ambulatory Visit: Payer: Self-pay | Admitting: Cardiology

## 2018-09-08 ENCOUNTER — Telehealth: Payer: Self-pay

## 2018-09-08 NOTE — Telephone Encounter (Signed)
Returned Joleen's call from [employer] re: pt's AVF. She stated that pt came in with it wrapped as it had been oozing this morning bright red. No fever or swelling. Told her to encourage him to elevate his arm and to notify us if anything changes or if this persists.

## 2018-09-09 ENCOUNTER — Telehealth: Payer: Self-pay

## 2018-09-09 ENCOUNTER — Ambulatory Visit (INDEPENDENT_AMBULATORY_CARE_PROVIDER_SITE_OTHER): Payer: Medicare Other | Admitting: Physician Assistant

## 2018-09-09 ENCOUNTER — Encounter: Payer: Self-pay | Admitting: Physician Assistant

## 2018-09-09 ENCOUNTER — Other Ambulatory Visit: Payer: Self-pay

## 2018-09-09 VITALS — BP 132/56 | HR 64 | Temp 97.0°F | Resp 16 | Ht 68.0 in | Wt 149.0 lb

## 2018-09-09 DIAGNOSIS — N186 End stage renal disease: Secondary | ICD-10-CM

## 2018-09-09 NOTE — Progress Notes (Signed)
    Postoperative Access Visit   History of Present Illness   Troy [name] is a 78 y.o. year old male who presents for postoperative follow-up for: left radiocephalic arteriovenous fistula by Dr. Scot Dock 09/04/18.  He previously had a more distal radiocephalic near the wrist that was found to be stenotic near the anastomosis.  A new fistula was created in the mid forearm past the stenotic area of cephalic vein.  Patient states that he began bleeding from his incision last night and has kept a tight bandage on it over the past 24 hours.  He denies any further bleeding at this time however was added on to clinic for evaluation of incision.  He is dialyzing via right IJ tunneled dialysis catheter on a Monday Wednesday Friday schedule.  He is on aspirin and Plavix due to a recent MI with coronary stenting last month.     Physical Examination   Vitals:   09/09/18 1514  BP: (!) 132/56  Pulse: 64  Resp: 16  Temp: (!) 97 F (36.1 C)  TempSrc: Oral  SpO2: 96%  Weight: 149 lb (67.6 kg)  Height: 5\' 8"  (1.727 m)   Body mass index is 22.66 kg/m.  left arm  after bandage was removed there is no further bleeding noted; palpable thrill throughout fistula and forearm, hand grip is 5/5, sensation in digits is intact, bruit can be auscultated     Medical Decision Making   Troy [name] is a 78 y.o. year old male who presents s/p left radiocephalic arteriovenous fistula   Patent fistula without signs or symptoms of steal syndrome  No further bleeding noted from incision when bandage was removed  Area of concern was reinforced with benzoin and Steri-Strips  He will follow-up as scheduled for dialysis access duplex on 10/09/2018  Dagoberto Ligas PA-C [facility] [place] Office: [phone]  Clinic MD: Dr. Donnetta Hutching

## 2018-09-09 NOTE — Telephone Encounter (Signed)
Returned pt's daughter [name]) call re: pt's surgical site still oozing blood. She is unsure if pt may have unknowingly injured it in the night and stated that he has dementia, so they are unsure what happened, if anything. Made appt for pt to come in this afternoon for a wound check.

## 2018-09-10 ENCOUNTER — Ambulatory Visit: Payer: Medicare Other
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
## 2018-09-17 ENCOUNTER — Encounter (HOSPITAL_COMMUNITY): Payer: Medicare Other

## 2018-09-17 ENCOUNTER — Ambulatory Visit: Payer: Medicare Other | Admitting: Vascular Surgery

## 2018-09-23 ENCOUNTER — Ambulatory Visit: Payer: Medicare Other | Admitting: Cardiology

## 2018-09-24 ENCOUNTER — Other Ambulatory Visit: Payer: Self-pay | Admitting: Cardiology

## 2018-09-24 ENCOUNTER — Encounter: Payer: Self-pay | Admitting: *Deleted

## 2018-10-08 ENCOUNTER — Encounter (HOSPITAL_COMMUNITY): Payer: Medicare Other

## 2018-10-08 ENCOUNTER — Other Ambulatory Visit: Payer: Self-pay

## 2018-10-08 DIAGNOSIS — N186 End stage renal disease: Secondary | ICD-10-CM

## 2018-10-09 ENCOUNTER — Encounter (HOSPITAL_COMMUNITY): Payer: Medicare Other

## 2018-10-09 ENCOUNTER — Other Ambulatory Visit: Payer: Self-pay

## 2018-10-09 ENCOUNTER — Other Ambulatory Visit (HOSPITAL_COMMUNITY): Payer: Self-pay | Admitting: Family

## 2018-10-09 ENCOUNTER — Ambulatory Visit (HOSPITAL_COMMUNITY)
Admission: RE | Admit: 2018-10-09 | Discharge: 2018-10-09 | Disposition: A | Discharge status: Home / Self Care | Payer: Medicare Other | Source: Ambulatory Visit | Attending: Family | Admitting: Family

## 2018-10-09 ENCOUNTER — Encounter: Payer: Self-pay | Admitting: Family

## 2018-10-09 ENCOUNTER — Ambulatory Visit (INDEPENDENT_AMBULATORY_CARE_PROVIDER_SITE_OTHER): Payer: Medicare Other | Admitting: Family

## 2018-10-09 ENCOUNTER — Ambulatory Visit (INDEPENDENT_AMBULATORY_CARE_PROVIDER_SITE_OTHER)
Admission: RE | Admit: 2018-10-09 | Discharge: 2018-10-09 | Disposition: A | Discharge status: Home / Self Care | Payer: Medicare Other | Source: Ambulatory Visit | Attending: Family | Admitting: Family

## 2018-10-09 VITALS — BP 122/57 | HR 40 | Temp 96.9°F | Resp 16 | Ht 68.0 in | Wt 153.5 lb

## 2018-10-09 DIAGNOSIS — I770 Arteriovenous fistula, acquired: Secondary | ICD-10-CM

## 2018-10-09 DIAGNOSIS — I6523 Occlusion and stenosis of bilateral carotid arteries: Secondary | ICD-10-CM

## 2018-10-09 DIAGNOSIS — L98491 Non-pressure chronic ulcer of skin of other sites limited to breakdown of skin: Secondary | ICD-10-CM

## 2018-10-09 DIAGNOSIS — Z992 Dependence on renal dialysis: Secondary | ICD-10-CM

## 2018-10-09 DIAGNOSIS — S90415A Abrasion, left lesser toe(s), initial encounter: Secondary | ICD-10-CM

## 2018-10-09 DIAGNOSIS — N186 End stage renal disease: Secondary | ICD-10-CM

## 2018-10-09 DIAGNOSIS — Z9889 Other specified postprocedural states: Secondary | ICD-10-CM
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
## 2018-10-09 NOTE — Patient Instructions (Signed)
Peripheral Vascular Disease  Peripheral vascular disease (PVD) is a disease of the blood vessels that are not part of your heart and brain. A simple term for PVD is poor circulation. In most cases, PVD narrows the blood vessels that carry blood from your heart to the rest of your body. This can reduce the supply of blood to your arms, legs, and internal organs, like your stomach or kidneys. However, PVD most often affects a person's lower legs and feet. Without treatment, PVD tends to get worse. PVD can also lead to acute ischemic limb. This is when an arm or leg suddenly cannot get enough blood. This is a medical emergency. Follow these instructions at home: Lifestyle  Do not use any products that contain nicotine or tobacco, such as cigarettes and e-cigarettes. If you need help quitting, ask your doctor.  Lose weight if you are overweight. Or, stay at a healthy weight as told by your doctor.  Eat a diet that is low in fat and cholesterol. If you need help, ask your doctor.  Exercise regularly. Ask your doctor for activities that are right for you. General instructions  Take over-the-counter and prescription medicines only as told by your doctor.  Take good care of your feet: ? Wear comfortable shoes that fit well. ? Check your feet often for any cuts or sores.  Keep all follow-up visits as told by your doctor This is important. Contact a doctor if:  You have cramps in your legs when you walk.  You have leg pain when you are at rest.  You have coldness in a leg or foot.  Your skin changes.  You are unable to get or have an erection (erectile dysfunction).  You have cuts or sores on your feet that do not heal. Get help right away if:  Your arm or leg turns cold, numb, and blue.  Your arms or legs become red, warm, swollen, painful, or numb.  You have chest pain.  You have trouble breathing.  You suddenly have weakness in your face, arm, or leg.  You become very  confused or you cannot speak.  You suddenly have a very bad headache.  You suddenly cannot see. Summary  Peripheral vascular disease (PVD) is a disease of the blood vessels.  A simple term for PVD is poor circulation. Without treatment, PVD tends to get worse.  Treatment may include exercise, low fat and low cholesterol diet, and quitting smoking. This information is not intended to replace advice given to you by your health care provider. Make sure you discuss any questions you have with your health care provider. Document Released: 09/19/2009 Document Revised: 08/02/2016 Document Reviewed: 08/02/2016 Elsevier Interactive Patient Education  2019 [employer] Inc[employer]  

## 2018-10-10 ENCOUNTER — Other Ambulatory Visit: Payer: Self-pay

## 2018-10-10 MED ORDER — ISOSORBIDE MONONITRATE ER 30 MG PO TB24: ORAL_TABLET | ORAL | 3 refills | Status: DC

## 2018-11-27 IMAGING — CR DG TIBIA/FIBULA 2V*R*
2 series · 2 of 2 positions shown · non-contrast
Comparison: None.

CLINICAL DATA: Recent fall with right leg pain, initial encounter

EXAM:
RIGHT TIBIA AND FIBULA - 2 VIEW

[x tib-fib ap right]
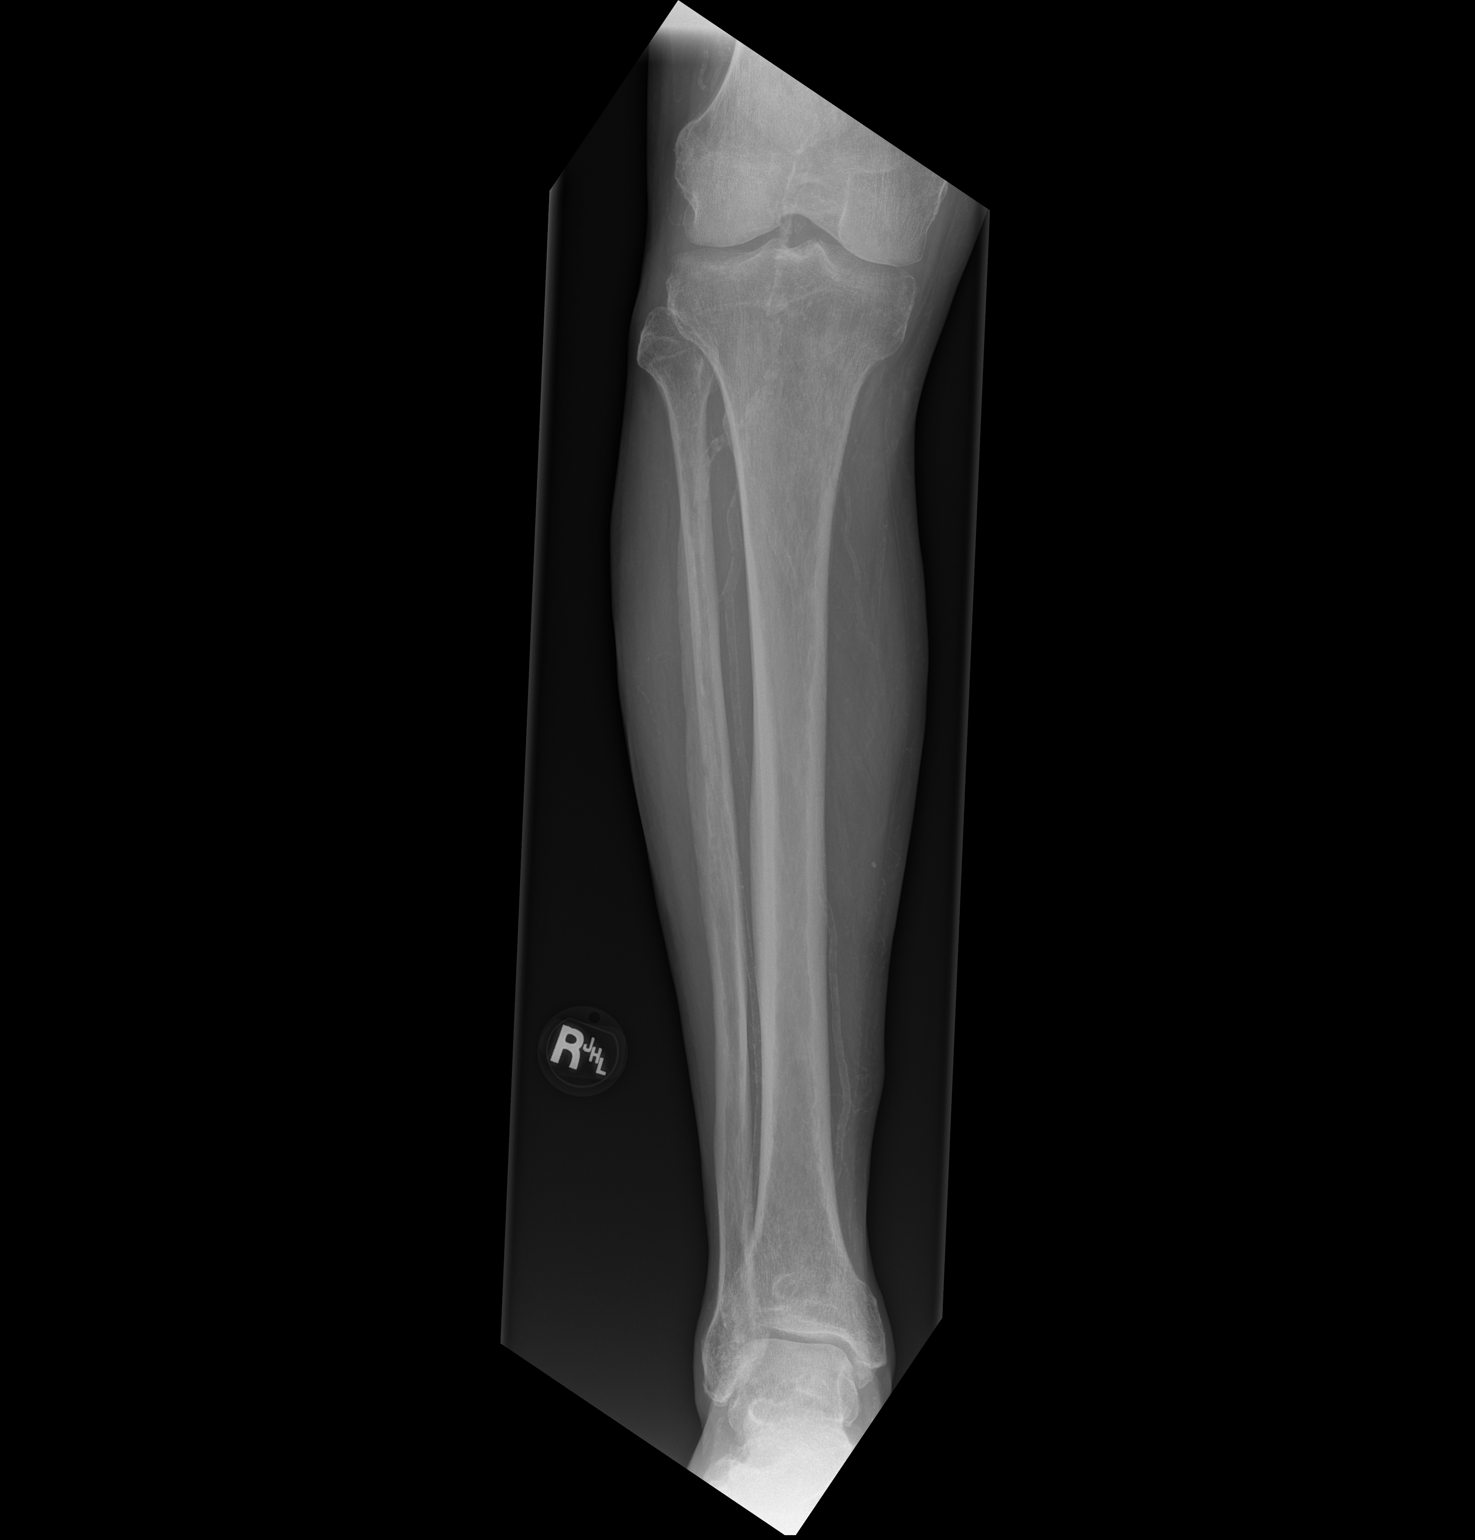

[x tib-fib lat right]
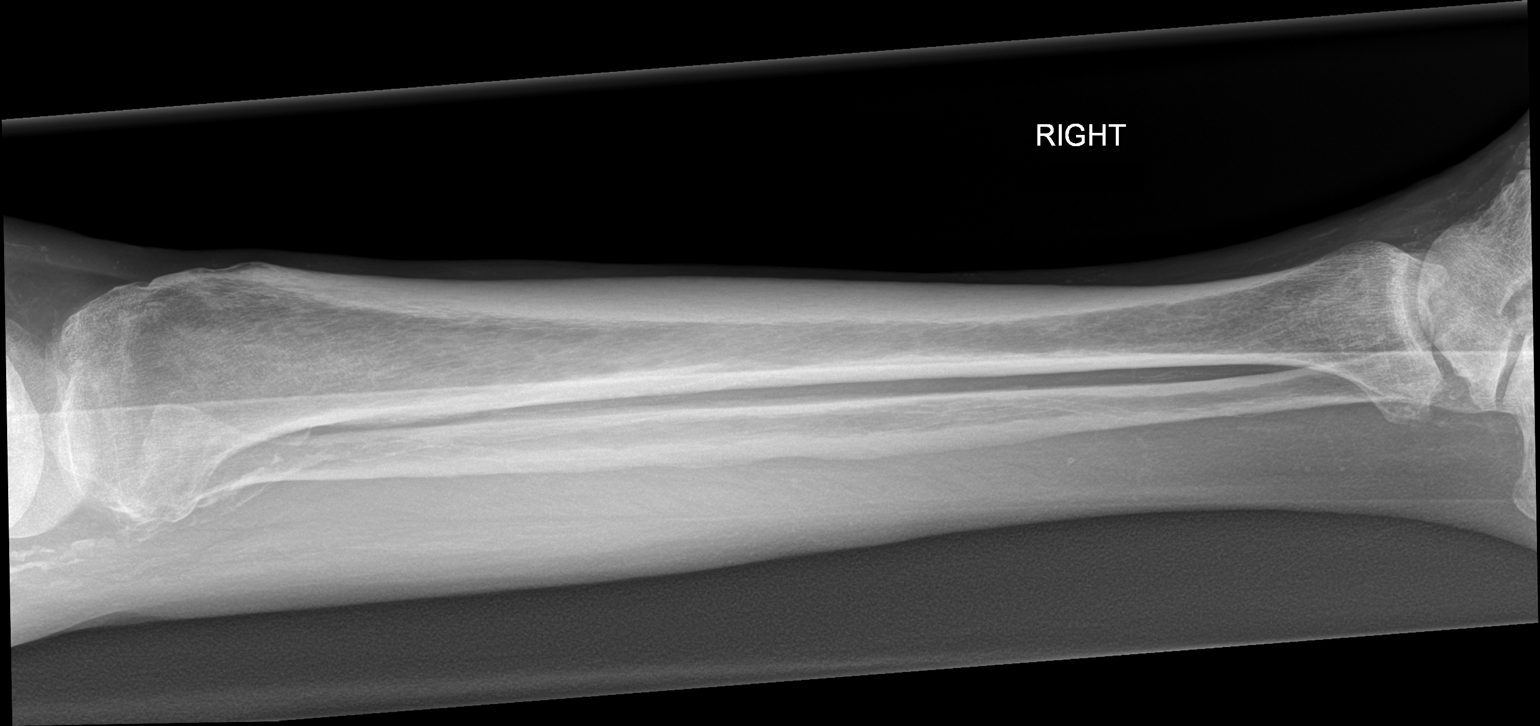

[2 of 2 positions shown; findings below may reference images not displayed]

FINDINGS: No acute fracture or dislocation is noted. Diffuse vascular
calcifications are seen. No soft tissue abnormality is noted.
IMPRESSION: No acute bony abnormality seen.

## 2018-12-23 ENCOUNTER — Encounter (HOSPITAL_COMMUNITY): Payer: Self-pay

## 2018-12-23 ENCOUNTER — Ambulatory Visit (HOSPITAL_COMMUNITY)
Admission: EM | Admit: 2018-12-23 | Discharge: 2018-12-23 | Disposition: A | Discharge status: Home / Self Care | Payer: Medicare Other

## 2018-12-23 ENCOUNTER — Other Ambulatory Visit: Payer: Self-pay

## 2018-12-23 ENCOUNTER — Telehealth: Payer: Self-pay

## 2018-12-23 DIAGNOSIS — S41112A Laceration without foreign body of left upper arm, initial encounter: Secondary | ICD-10-CM | POA: Diagnosis not present

## 2018-12-23 NOTE — Discharge Instructions (Signed)
Keep area clean, dry. Use non-adherent pads and coban. Return next week for recheck, or sooner if area becomes more painful, swollen, red, bleeding, elbow pain, fever.

## 2018-12-23 NOTE — Telephone Encounter (Signed)
Pts daughter called and said that he has been having issues with his fistula. She said that 2 weeks ago they had a hard time with dialysis and now he continues to have issues with it bleeding. She said that they were told to go to the Urgent Care to have it looked at but states that they cannot do that.   Called and advised her to call the nephrologist and have them decide what measures they need to take if they are having issues with dialysis and with bleeding.   She said that a chunk of skin came off as well with the tape that was used.   Advised her that they could evaluate that as well when they are looking at his fistula.   York Cerise, CMA

## 2018-12-23 NOTE — ED Triage Notes (Signed)
Patient presents to Urgent Care with complaints of skin tear to right arm since earlier this morning. Patient reports he is on Plavix, is a dialysis pt.
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
## 2019-05-27 ENCOUNTER — Other Ambulatory Visit: Payer: Self-pay

## 2019-05-27 MED ORDER — AMIODARONE HCL 200 MG PO TABS: ORAL_TABLET | ORAL | 0 refills | Status: DC

## 2019-05-28 ENCOUNTER — Other Ambulatory Visit: Payer: Self-pay

## 2019-07-06 ENCOUNTER — Other Ambulatory Visit: Payer: Self-pay | Admitting: Physician Assistant

## 2019-07-08 ENCOUNTER — Other Ambulatory Visit: Payer: Self-pay | Admitting: Physician Assistant

## 2019-07-08 NOTE — Telephone Encounter (Signed)
Rx(s) sent to pharmacy electronically.  

## 2019-07-13 ENCOUNTER — Other Ambulatory Visit: Payer: Self-pay

## 2019-07-13 MED ORDER — ISOSORBIDE MONONITRATE ER 30 MG PO TB24: 30.0000 mg | ORAL_TABLET | Freq: Every day | ORAL | 0 refills | Status: DC

## 2019-07-15 ENCOUNTER — Other Ambulatory Visit: Payer: Self-pay

## 2019-07-27 ENCOUNTER — Other Ambulatory Visit: Payer: Self-pay

## 2019-08-03 ENCOUNTER — Other Ambulatory Visit: Payer: Self-pay

## 2019-08-03 MED ORDER — AMIODARONE HCL 200 MG PO TABS: ORAL_TABLET | ORAL | 0 refills | Status: DC

## 2019-08-04 ENCOUNTER — Other Ambulatory Visit: Payer: Self-pay

## 2019-10-13 ENCOUNTER — Telehealth: Payer: Self-pay | Admitting: Cardiology

## 2019-10-13 NOTE — Telephone Encounter (Signed)
°*  STAT* If patient is at the pharmacy, call can be transferred to refill team.   1. Which medications need to be refilled? (please list name of each medication and dose if known) amiodarone (PACERONE) 200 MG tablet  2. Which pharmacy/location (including street and city if local pharmacy) is medication to be sent to? [place], [place]  3. Do they need a 30 day or 90 day supply? 90   Patient is out of medication

## 2019-10-14 ENCOUNTER — Telehealth: Payer: Self-pay | Admitting: Cardiology

## 2019-10-14 ENCOUNTER — Other Ambulatory Visit: Payer: Self-pay | Admitting: Cardiology

## 2019-10-14 MED ORDER — AMIODARONE HCL 200 MG PO TABS: ORAL_TABLET | ORAL | 0 refills | Status: DC

## 2019-10-14 NOTE — Telephone Encounter (Signed)
error 

## 2019-10-14 NOTE — Telephone Encounter (Signed)
Follow Up  Pt c/o medication issue:  1. Name of Medication:  amiodarone (PACERONE) 200 MG tablet  2. How are you currently taking this medication (dosage and times per day)?  As written  3. Are you having a reaction (difficulty breathing--STAT)? No  4. What is your medication issue? Pt has been out of medication for two weeks. Pt is out of refills. Needs a new prescription asap

## 2019-10-16 NOTE — Telephone Encounter (Signed)
Rx for amiodarone was sent in on 4/7.
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
## 2019-10-26 ENCOUNTER — Ambulatory Visit: Payer: Medicare Other | Admitting: Cardiology

## 2019-11-06 ENCOUNTER — Other Ambulatory Visit: Payer: Self-pay

## 2019-11-16 ENCOUNTER — Other Ambulatory Visit: Payer: Self-pay | Admitting: Cardiology

## 2019-11-17 ENCOUNTER — Other Ambulatory Visit: Payer: Self-pay

## 2019-11-17 MED ORDER — ISOSORBIDE MONONITRATE ER 30 MG PO TB24: 30.0000 mg | ORAL_TABLET | Freq: Every day | ORAL | 0 refills | Status: DC

## 2019-12-08 ENCOUNTER — Emergency Department (HOSPITAL_COMMUNITY)
Admission: EM | Admit: 2019-12-08 | Discharge: 2019-12-08 | Disposition: A | Discharge status: Home / Self Care | Payer: No Typology Code available for payment source | Attending: Emergency Medicine | Admitting: Emergency Medicine

## 2019-12-08 ENCOUNTER — Other Ambulatory Visit: Payer: Self-pay

## 2019-12-08 ENCOUNTER — Emergency Department (HOSPITAL_COMMUNITY): Payer: No Typology Code available for payment source

## 2019-12-08 ENCOUNTER — Encounter (HOSPITAL_COMMUNITY): Payer: Self-pay

## 2019-12-08 DIAGNOSIS — S61419A Laceration without foreign body of unspecified hand, initial encounter: Secondary | ICD-10-CM | POA: Diagnosis not present

## 2019-12-08 DIAGNOSIS — Y999 Unspecified external cause status: Secondary | ICD-10-CM | POA: Diagnosis not present

## 2019-12-08 DIAGNOSIS — I2510 Atherosclerotic heart disease of native coronary artery without angina pectoris: Secondary | ICD-10-CM | POA: Insufficient documentation

## 2019-12-08 DIAGNOSIS — N186 End stage renal disease: Secondary | ICD-10-CM | POA: Insufficient documentation

## 2019-12-08 DIAGNOSIS — I5032 Chronic diastolic (congestive) heart failure: Secondary | ICD-10-CM | POA: Diagnosis not present

## 2019-12-08 DIAGNOSIS — Z79899 Other long term (current) drug therapy: Secondary | ICD-10-CM | POA: Diagnosis not present

## 2019-12-08 DIAGNOSIS — Y929 Unspecified place or not applicable: Secondary | ICD-10-CM | POA: Diagnosis not present

## 2019-12-08 DIAGNOSIS — I132 Hypertensive heart and chronic kidney disease with heart failure and with stage 5 chronic kidney disease, or end stage renal disease: Secondary | ICD-10-CM | POA: Diagnosis not present

## 2019-12-08 DIAGNOSIS — M25532 Pain in left wrist: Secondary | ICD-10-CM | POA: Diagnosis not present

## 2019-12-08 DIAGNOSIS — Z7901 Long term (current) use of anticoagulants: Secondary | ICD-10-CM | POA: Diagnosis not present

## 2019-12-08 DIAGNOSIS — R519 Headache, unspecified: Secondary | ICD-10-CM | POA: Diagnosis not present

## 2019-12-08 DIAGNOSIS — I252 Old myocardial infarction: Secondary | ICD-10-CM | POA: Insufficient documentation

## 2019-12-08 DIAGNOSIS — W010XXA Fall on same level from slipping, tripping and stumbling without subsequent striking against object, initial encounter: Secondary | ICD-10-CM | POA: Diagnosis not present

## 2019-12-08 DIAGNOSIS — W19XXXA Unspecified fall, initial encounter: Secondary | ICD-10-CM

## 2019-12-08 DIAGNOSIS — Y9301 Activity, walking, marching and hiking: Secondary | ICD-10-CM | POA: Insufficient documentation

## 2019-12-08 DIAGNOSIS — Z87891 Personal history of nicotine dependence: Secondary | ICD-10-CM | POA: Diagnosis not present

## 2019-12-08 IMAGING — CT CT HEAD W/O CM
2 series · 15 of 37 positions shown, 18 images · non-contrast
Comparison: [DATE]

CLINICAL DATA: Recent fall with headaches, initial encounter

EXAM:
CT HEAD WITHOUT CONTRAST
TECHNIQUE: Contiguous axial images were obtained from the base of the skull
through the vertex without intravenous contrast.

[Series 2: head wo · axial · 0.40mm/px · z∈[-138,-8]mm · 12 of 32 slices shown, 15 images]
[im 3/32  brain]
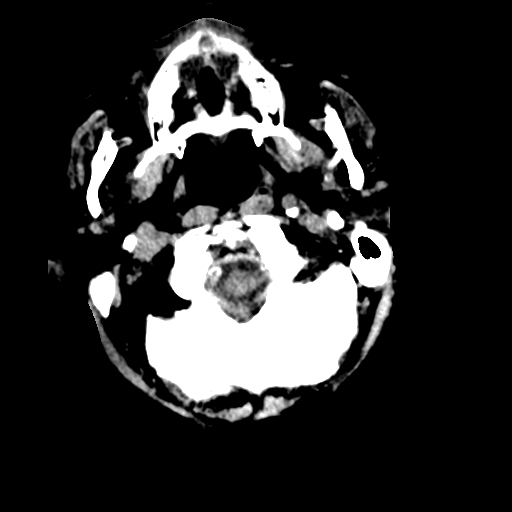
[im 3/32  bone]
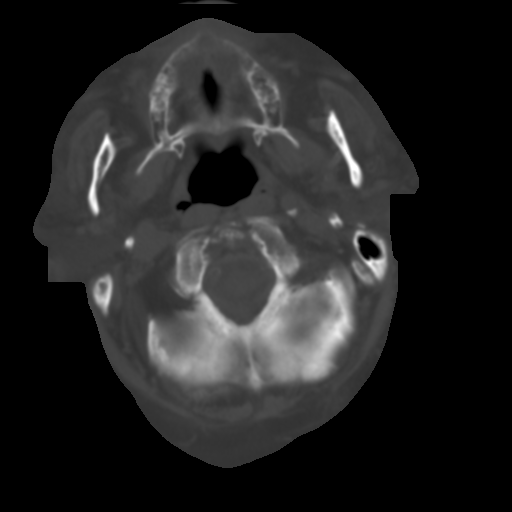
[im 5/32  brain]
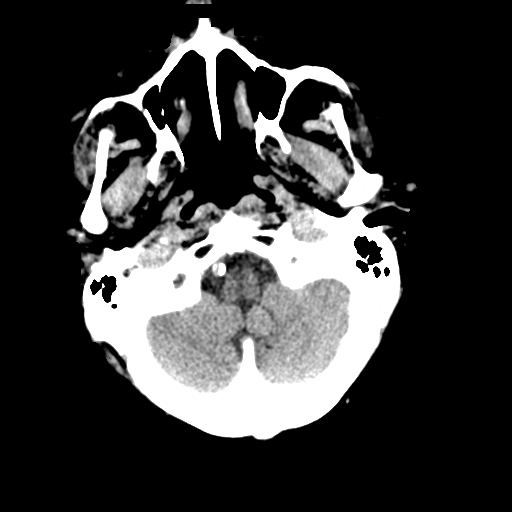
[im 7/32  brain]
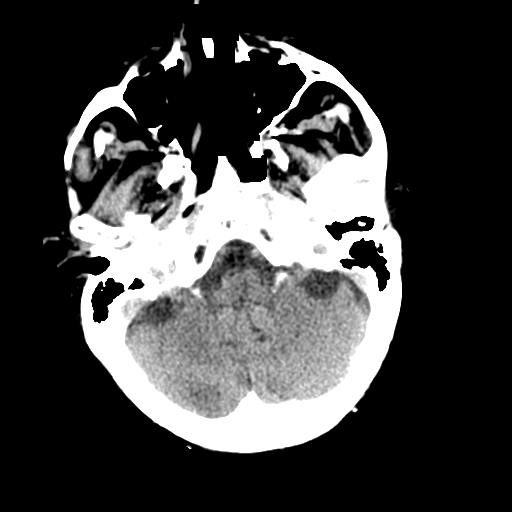
[im 10/32  brain]
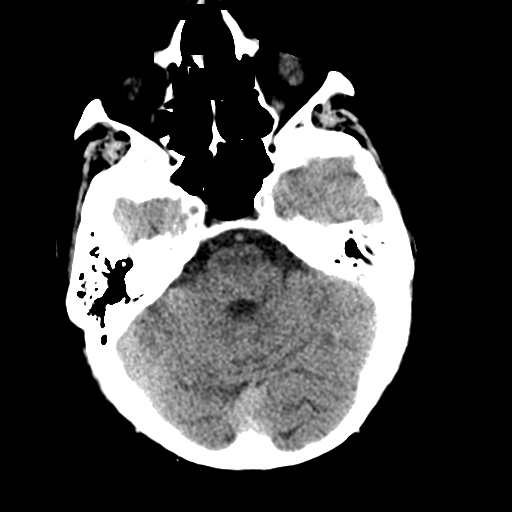
[im 12/32  brain]
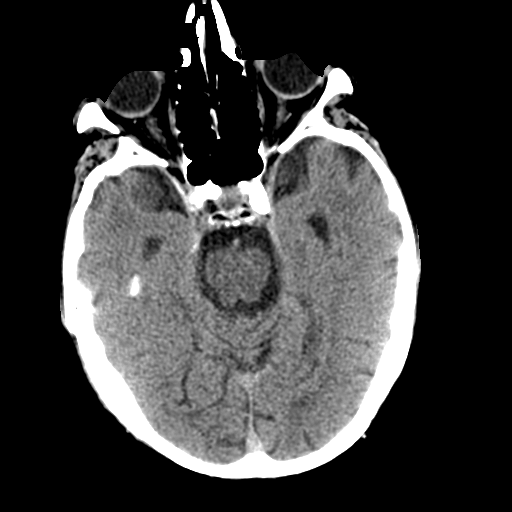
[im 12/32  bone]
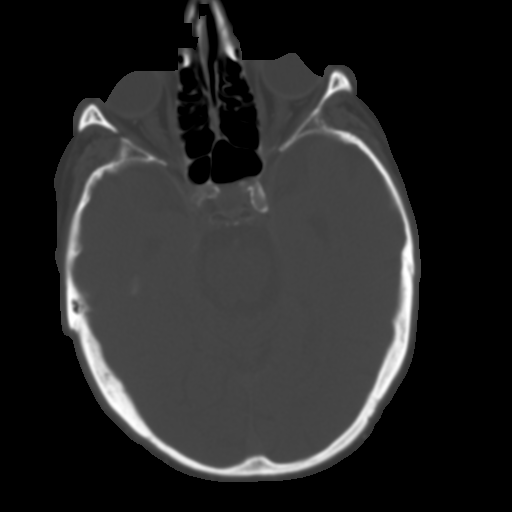
[im 14/32  brain]
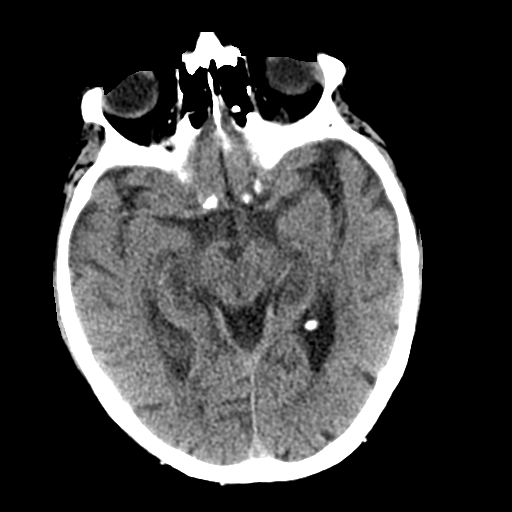
[im 18/32  brain]
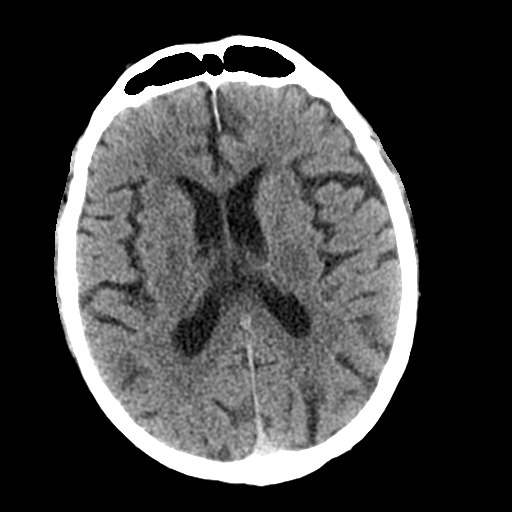
[im 20/32  brain]
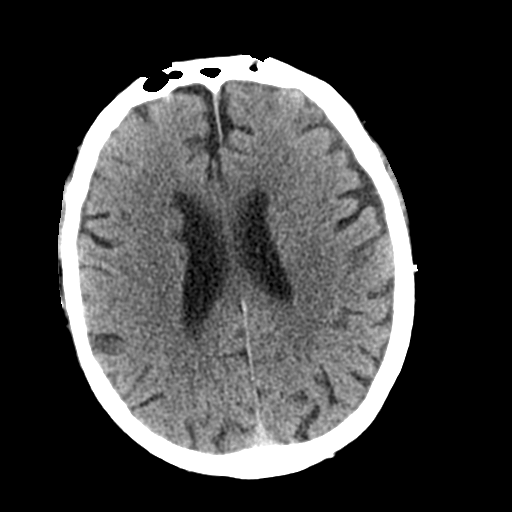
[im 22/32  brain]
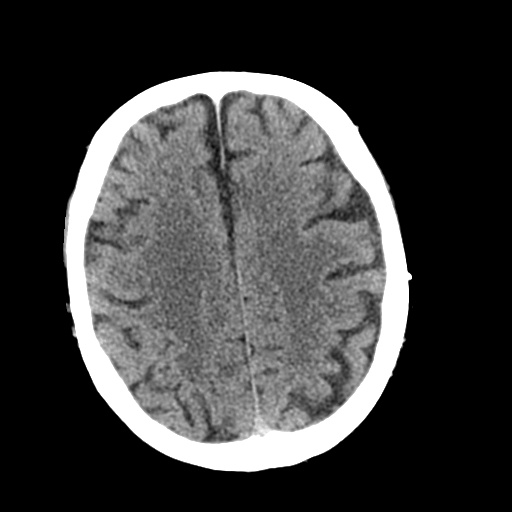
[im 22/32  bone]
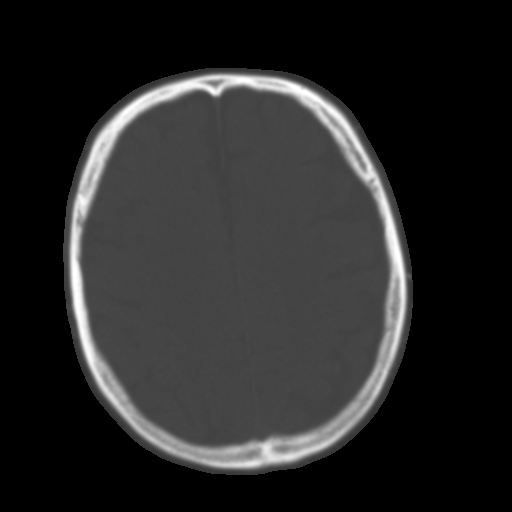
[im 25/32  brain]
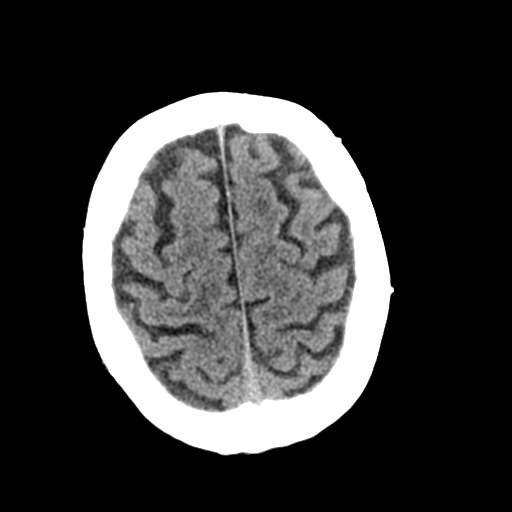
[im 27/32  brain]
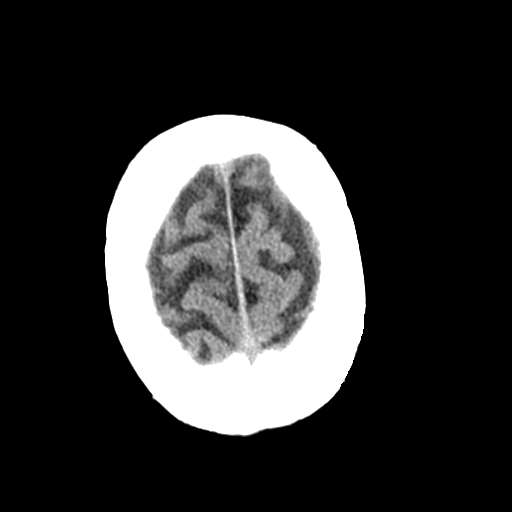
[im 29/32  brain]
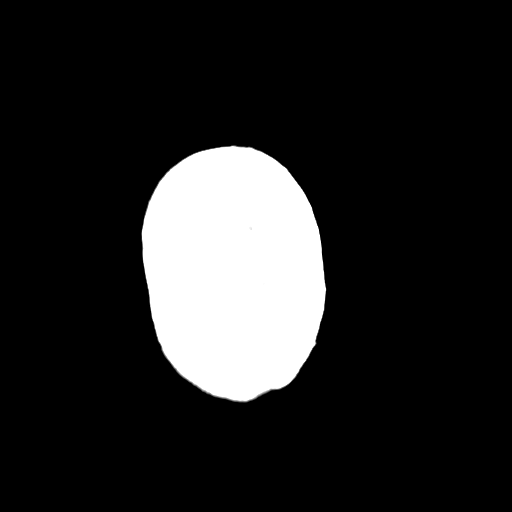

[Series 6: sagittal soft tissue · sagittal · 0.31mm/px · 3 of 51 slices shown]
[im 17/51  brain]
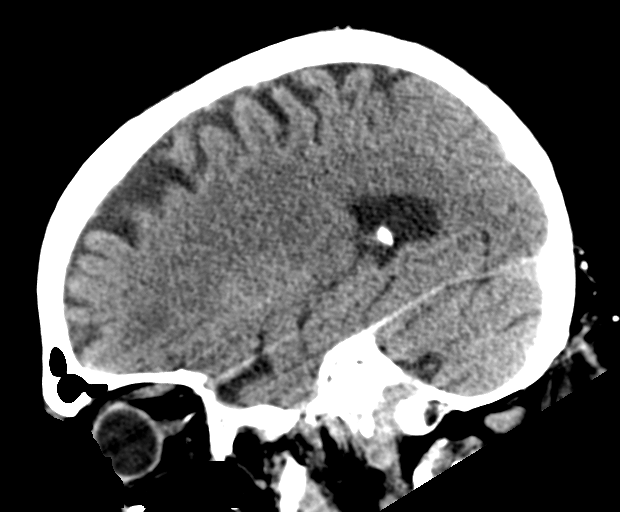
[im 26/51  brain]
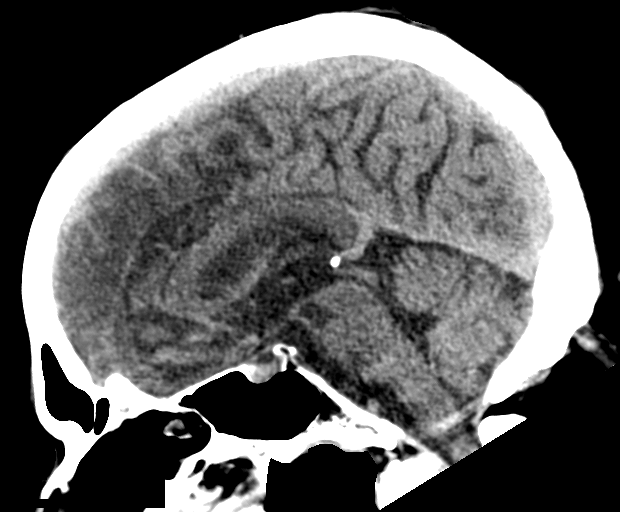
[im 34/51  brain]
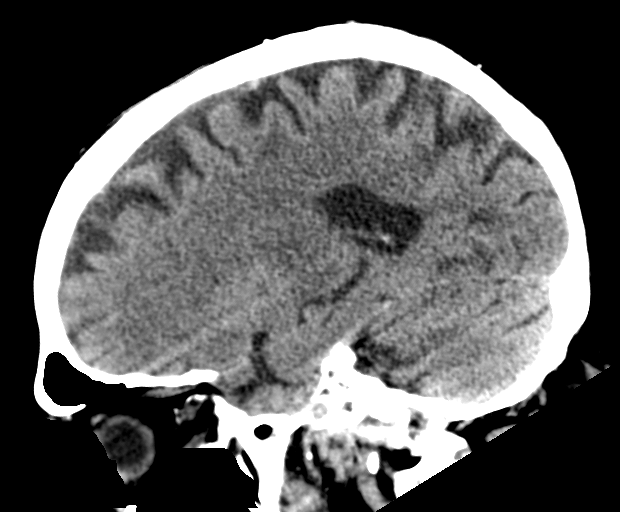

[15 of 37 positions shown; findings below may reference images not displayed]

FINDINGS: Brain: Chronic atrophic and ischemic changes are noted. No findings
to suggest acute hemorrhage, acute infarction or space-occupying
mass lesion are seen.

Vascular: No hyperdense vessel or unexpected calcification.

Skull: Normal. Negative for fracture or focal lesion.

Sinuses/Orbits: No acute finding.

Other: None.
IMPRESSION: Chronic atrophic and ischemic changes without acute abnormality.

## 2019-12-08 IMAGING — CR DG WRIST COMPLETE 3+V*L*
4 series · 4 of 4 positions shown · non-contrast
Comparison: None.

CLINICAL DATA: Recent fall with left wrist pain, initial encounter

EXAM:
LEFT WRIST - COMPLETE 3+ VIEW

[x wrist pa left]
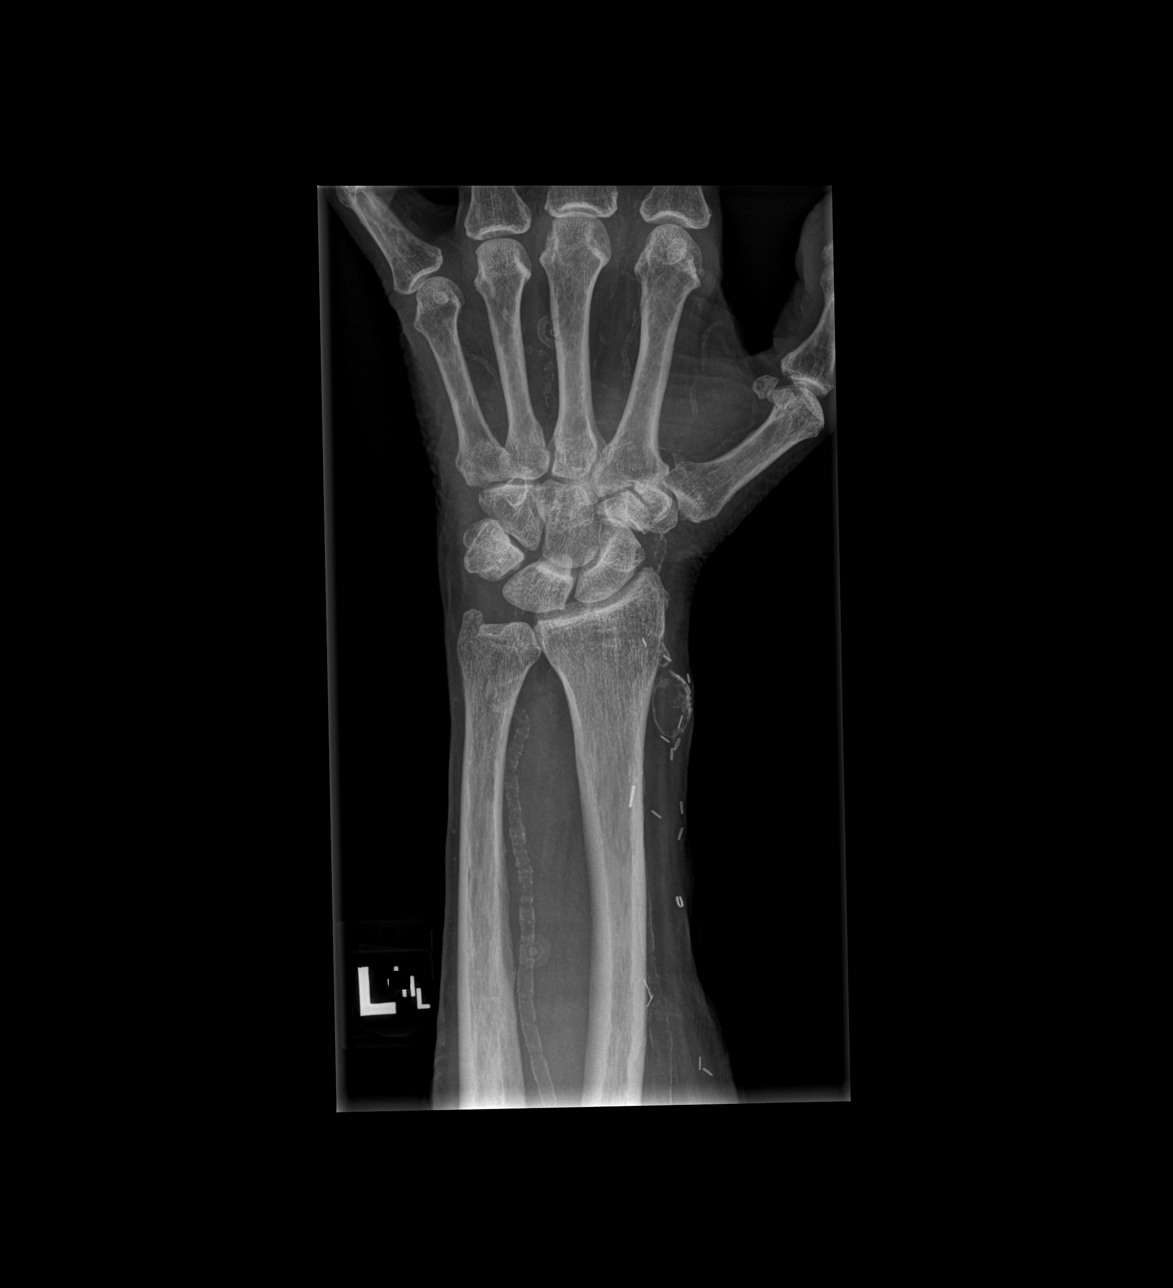

[x wrist obl left]
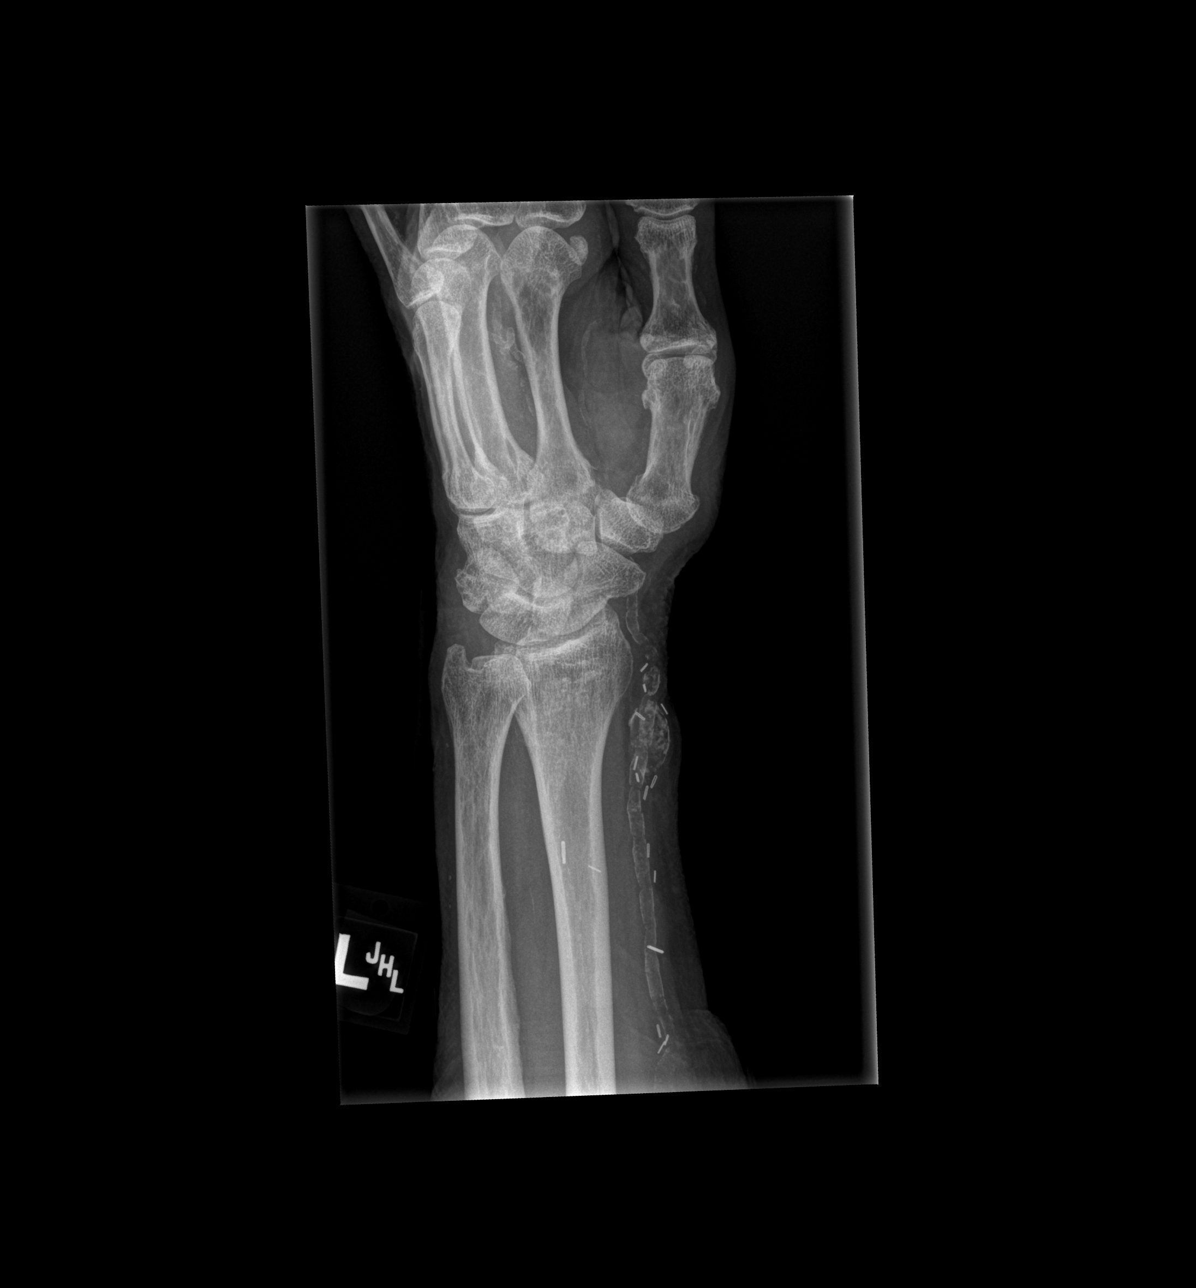

[x wrist lat left]
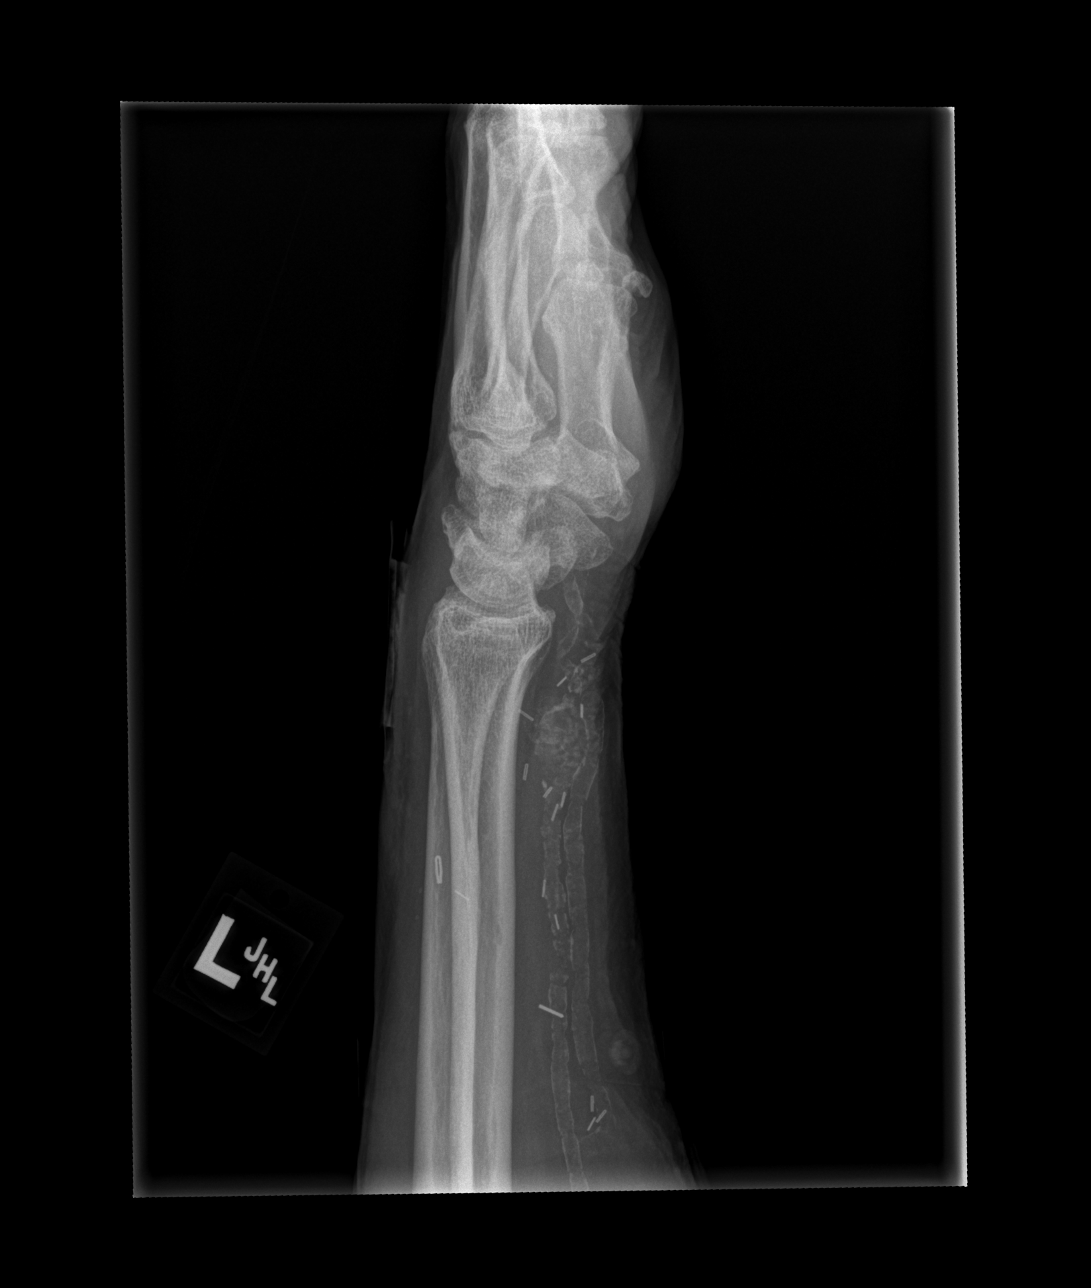

[x wrist navicular view left]
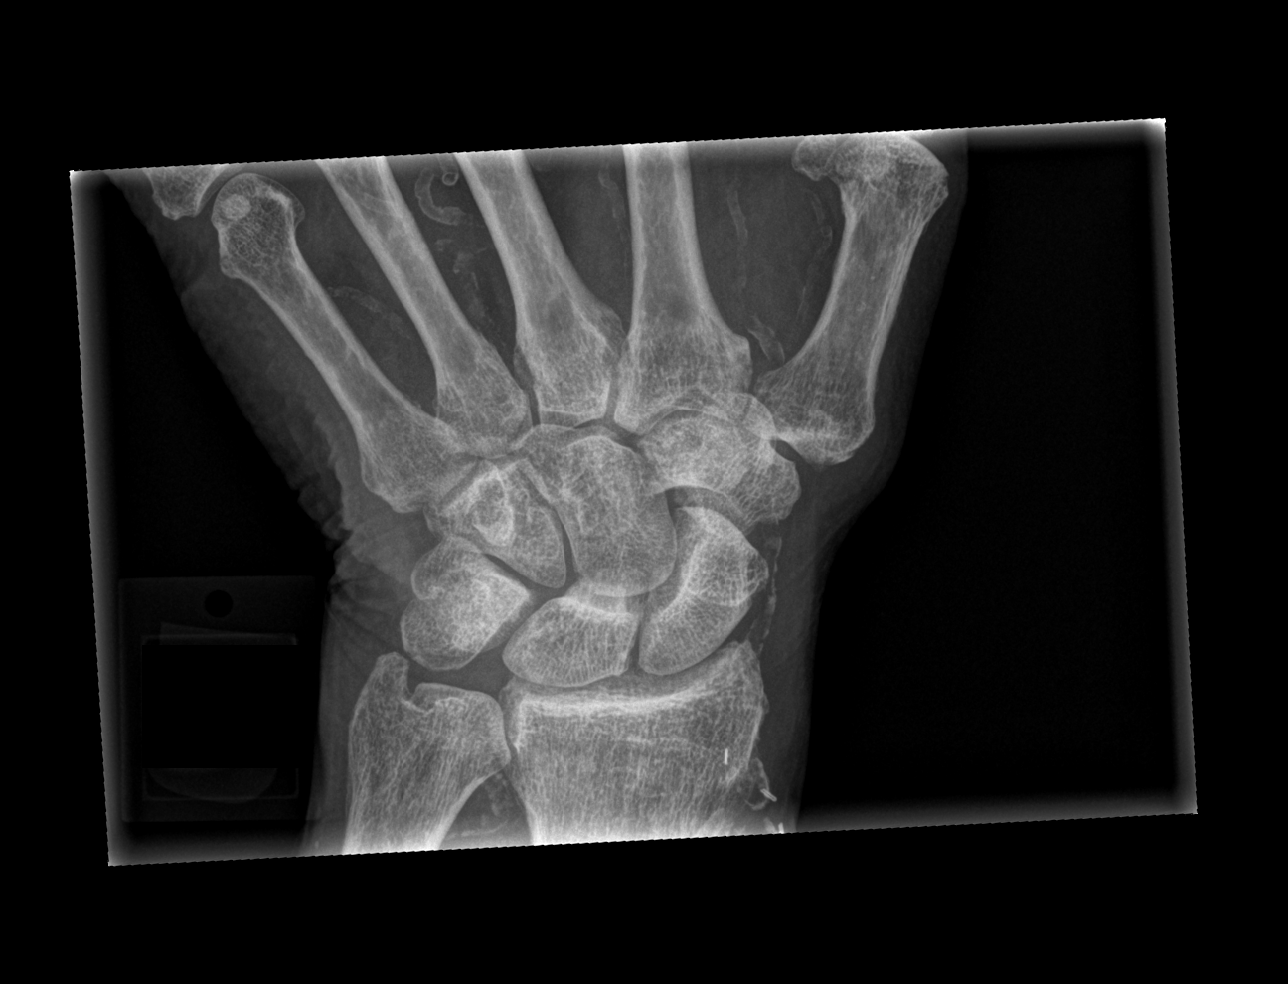

[4 of 4 positions shown; findings below may reference images not displayed]

FINDINGS: Mild osteopenia is noted. Changes consistent with prior AV fistula
placement are seen. Diffuse vascular calcifications are noted. No
definitive acute fracture or dislocation is noted. No focal hematoma
is seen.
IMPRESSION: Chronic changes without acute abnormality.

## 2019-12-08 NOTE — Discharge Instructions (Signed)
You may alternate taking Tylenol as needed for pain control. You may take 500-1000 mg of Tylenol every 6 hours. Do not exceed 4000 mg of Tylenol daily as this can lead to liver damage. You may use warm and cold compresses to help with your symptoms.   Please follow up with your primary care provider within 5-7 days for re-evaluation of your symptoms. If you do not have a primary care provider, information for a healthcare clinic has been provided for you to make arrangements for follow up care. Please return to the emergency department for any new or worsening symptoms.  
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
## 2019-12-08 NOTE — ED Triage Notes (Signed)
Patient fell over his walker/roller today. Patient has a hematoma to the right shin, skin tear to the left wrist.. Patient denies hitting his head. Patient is on blood thinners. Patient also c/o not being able to hear. Patient had put q-tips in his ears on 5/19 which caused bleeding in his ears. Patient was suppose to get tested for hearing aids and still has blood in his ears and they were unable to perform the test.

## 2019-12-08 NOTE — ED Provider Notes (Signed)
°  Face-to-face evaluation   History: He presents for evaluation of bleeding from both ears, which started over 10 days ago. He has had intermittent bleeding since. He saw an audiologist around that time and they were unable to perform the testing because of material in the auditory canals. Today he fell while walking outside using his [employer] walker.  Physical exam: Elderly male alert and cooperative. Ears with bilateral EAC occlusions with cerumen and blood. Unable to see either TM. Right upper extremity with mild ecchymosis. No dysarthria or aphasia.   Medical screening examination/treatment/procedure(s) were conducted as a shared visit with non-physician practitioner(s) and myself.  I personally evaluated the patient during the encounter    Daleen Bo, MD 12/09/19 [phone]

## 2019-12-09 ENCOUNTER — Other Ambulatory Visit: Payer: Self-pay

## 2019-12-09 MED ORDER — ISOSORBIDE MONONITRATE ER 30 MG PO TB24: 30.0000 mg | ORAL_TABLET | Freq: Every day | ORAL | 0 refills | Status: DC

## 2019-12-09 NOTE — Progress Notes (Signed)
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 Person  Provider:   Kirk Ruths, MD       Signed, Darreld Mclean, PA-C  12/10/2019 5:28 PM    [place]

## 2019-12-10 ENCOUNTER — Ambulatory Visit (INDEPENDENT_AMBULATORY_CARE_PROVIDER_SITE_OTHER): Payer: No Typology Code available for payment source | Admitting: Student

## 2019-12-10 ENCOUNTER — Encounter: Payer: Self-pay | Admitting: Student

## 2019-12-10 ENCOUNTER — Other Ambulatory Visit: Payer: Self-pay

## 2019-12-10 VITALS — BP 130/54 | HR 61 | Ht 67.5 in | Wt 163.6 lb

## 2019-12-10 DIAGNOSIS — I5042 Chronic combined systolic (congestive) and diastolic (congestive) heart failure: Secondary | ICD-10-CM | POA: Diagnosis not present

## 2019-12-10 DIAGNOSIS — I480 Paroxysmal atrial fibrillation: Secondary | ICD-10-CM

## 2019-12-10 DIAGNOSIS — I340 Nonrheumatic mitral (valve) insufficiency: Secondary | ICD-10-CM

## 2019-12-10 DIAGNOSIS — E785 Hyperlipidemia, unspecified: Secondary | ICD-10-CM

## 2019-12-10 DIAGNOSIS — I2510 Atherosclerotic heart disease of native coronary artery without angina pectoris: Secondary | ICD-10-CM

## 2019-12-10 DIAGNOSIS — D649 Anemia, unspecified: Secondary | ICD-10-CM

## 2019-12-10 DIAGNOSIS — N186 End stage renal disease: Secondary | ICD-10-CM

## 2019-12-10 DIAGNOSIS — I6523 Occlusion and stenosis of bilateral carotid arteries: Secondary | ICD-10-CM | POA: Diagnosis not present

## 2019-12-10 NOTE — Patient Instructions (Signed)
Medication Instructions:  Your physician recommends that you continue on your current medications as directed. Please refer to the Current Medication list given to you today.  *If you need a refill on your cardiac medications before your next appointment, please call your pharmacy*   Lab Work: Your physician recommends that you return for lab work when you come in for your carotid ultrasound: CMET, TSH, CBC, Lipid. These are FASTING labs---nothing to eat eat or drink after midnight the night before.  If you have labs (blood work) drawn today and your tests are completely normal, you will receive your results only by:  [place] (if you have MyChart) OR  A paper copy in the mail If you have any lab test that is abnormal or we need to change your treatment, we will call you to review the results.   Testing/Procedures:  (SAME DAY IF POSSIBLE):  Your physician has requested that you have a carotid duplex. This test is an ultrasound of the carotid arteries in your neck. It looks at blood flow through these arteries that supply the brain with blood. Allow one hour for this exam. There are no restrictions or special instructions.  Your physician has requested that you have an echocardiogram. Echocardiography is a painless test that uses sound waves to create images of your heart. It provides your doctor with information about the size and shape of your heart and how well your hearts chambers and valves are working. This procedure takes approximately one hour. There are no restrictions for this procedure.  Follow-Up: At [facility], you and your health needs are our priority.  As part of our continuing mission to provide you with exceptional heart care, we have created designated Provider Care Teams.  These Care Teams include your primary Cardiologist (physician) and Advanced Practice Providers (APPs -  Physician Assistants and Nurse Practitioners) who all work together to provide you  with the care you need, when you need it.  We recommend signing up for the patient portal called "MyChart".  Sign up information is provided on this After Visit Summary.  MyChart is used to connect with patients for Virtual Visits (Telemedicine).  Patients are able to view lab/test results, encounter notes, upcoming appointments, etc.  Non-urgent messages can be sent to your provider as well.   To learn more about what you can do with MyChart, go to [email or web address].    Your next appointment:   3 month(s)  The format for your next appointment:   In Person  Provider:   Kirk Ruths, MD

## 2019-12-11 ENCOUNTER — Telehealth: Payer: Self-pay | Admitting: Student

## 2019-12-11 ENCOUNTER — Telehealth: Payer: Self-pay | Admitting: Cardiology

## 2019-12-11 NOTE — Telephone Encounter (Signed)
Left message for daughter, Caren Griffins to call and scheduled carotid duplex, echocardiogram and 3 month follow up with Dr. Stanford Breed

## 2019-12-11 NOTE — Telephone Encounter (Signed)
Please let patient know that I talked to Dr. Stanford Breed after his visit on Thursday and he said it is OK to stop Plavix now that he is >1 year from placement of stent. He should continue Aspirin though.   Thank you!

## 2019-12-14 ENCOUNTER — Other Ambulatory Visit: Payer: Self-pay | Admitting: Cardiology

## 2019-12-14 NOTE — Telephone Encounter (Signed)
Called patient- LVM advised of message.  Advised to call back if questions.

## 2019-12-14 NOTE — Telephone Encounter (Signed)
Rx request sent to pharmacy.  

## 2019-12-15 ENCOUNTER — Telehealth: Payer: Self-pay | Admitting: Cardiology

## 2019-12-15 MED ORDER — AMIODARONE HCL 200 MG PO TABS: ORAL_TABLET | ORAL | 3 refills | Status: DC

## 2019-12-15 NOTE — Telephone Encounter (Signed)
New message   Patient's daughter states that need a new prescription for amiodarone (PACERONE) 200 MG tablet sent to [place], [place]

## 2019-12-15 NOTE — Telephone Encounter (Signed)
Refill sent as requested ./cy 

## 2019-12-31 ENCOUNTER — Ambulatory Visit (HOSPITAL_COMMUNITY): Payer: Medicare Other | Attending: Cardiology

## 2019-12-31 ENCOUNTER — Other Ambulatory Visit: Payer: Self-pay

## 2019-12-31 DIAGNOSIS — N186 End stage renal disease: Secondary | ICD-10-CM | POA: Diagnosis present

## 2019-12-31 DIAGNOSIS — E785 Hyperlipidemia, unspecified: Secondary | ICD-10-CM | POA: Diagnosis present

## 2019-12-31 DIAGNOSIS — I5042 Chronic combined systolic (congestive) and diastolic (congestive) heart failure: Secondary | ICD-10-CM | POA: Insufficient documentation

## 2019-12-31 DIAGNOSIS — I480 Paroxysmal atrial fibrillation: Secondary | ICD-10-CM | POA: Diagnosis not present

## 2019-12-31 DIAGNOSIS — I2510 Atherosclerotic heart disease of native coronary artery without angina pectoris: Secondary | ICD-10-CM | POA: Diagnosis not present

## 2019-12-31 DIAGNOSIS — I6523 Occlusion and stenosis of bilateral carotid arteries: Secondary | ICD-10-CM | POA: Insufficient documentation

## 2019-12-31 DIAGNOSIS — I340 Nonrheumatic mitral (valve) insufficiency: Secondary | ICD-10-CM | POA: Diagnosis present

## 2019-12-31 MED ORDER — PERFLUTREN LIPID MICROSPHERE
1.0000 mL | INTRAVENOUS | Status: AC | PRN
  Administered 2019-12-31: 1 mL via INTRAVENOUS

## 2020-01-06 ENCOUNTER — Other Ambulatory Visit (HOSPITAL_COMMUNITY): Payer: Self-pay | Admitting: Student

## 2020-01-06 ENCOUNTER — Other Ambulatory Visit: Payer: Self-pay

## 2020-01-06 ENCOUNTER — Ambulatory Visit (HOSPITAL_COMMUNITY)
Admission: RE | Admit: 2020-01-06 | Discharge: 2020-01-06 | Disposition: A | Discharge status: Home / Self Care | Payer: Medicare Other | Source: Ambulatory Visit | Attending: Cardiovascular Disease | Admitting: Cardiovascular Disease

## 2020-01-06 DIAGNOSIS — I6523 Occlusion and stenosis of bilateral carotid arteries: Secondary | ICD-10-CM | POA: Insufficient documentation

## 2020-01-07 ENCOUNTER — Other Ambulatory Visit: Payer: Self-pay

## 2020-01-07 DIAGNOSIS — I6523 Occlusion and stenosis of bilateral carotid arteries: Secondary | ICD-10-CM

## 2020-01-15 ENCOUNTER — Telehealth: Payer: Self-pay

## 2020-01-15 NOTE — Telephone Encounter (Addendum)
Left a voice message for the patient asking for him to give me a call back pertaining to he recent Carotid US or he can check his MyChart because the comments were also sent there.  ----- Message from Abigail Butts, PA-C sent at 01/06/2020  7:04 PM EDT -----Hi Mr. Hoar - I am covering for Sande Rives, PA-C who is out of the office this week. The ultrasound of your carotid arteries appear stable from 2020 - there was mention of some shadowing that interfered with the imaging quality but was overall reassuring that your plaque buildup has not changed significantly. We will plan to repeat your ultrasound in 1 year. Please continue your current medications as prescribed. Thank you!

## 2020-01-25 ENCOUNTER — Other Ambulatory Visit: Payer: Self-pay | Admitting: Cardiology

## 2020-01-25 MED ORDER — ISOSORBIDE MONONITRATE ER 30 MG PO TB24: 30.0000 mg | ORAL_TABLET | Freq: Every day | ORAL | 3 refills | Status: DC

## 2020-01-25 NOTE — Telephone Encounter (Signed)
*  STAT* If patient is at the pharmacy, call can be transferred to refill team.   1. Which medications need to be refilled? (please list name of each medication and dose if known) isosorbide mononitrate (IMDUR) 30 MG 24 hr tablet  2. Which pharmacy/location (including street and city if local pharmacy) is medication to be sent to? [place], [place]  3. Do they need a 30 day or 90 day supply? 90 day  Patient is out of medication. Patient was seen 12/10/2019.

## 2020-01-25 NOTE — Telephone Encounter (Signed)
Rx(s) sent to pharmacy electronically.  

## 2020-03-27 ENCOUNTER — Other Ambulatory Visit (HOSPITAL_COMMUNITY): Payer: Self-pay | Admitting: Nurse Practitioner

## 2020-03-27 DIAGNOSIS — I10 Essential (primary) hypertension: Secondary | ICD-10-CM

## 2020-03-27 DIAGNOSIS — U071 COVID-19: Secondary | ICD-10-CM

## 2020-03-27 DIAGNOSIS — I5032 Chronic diastolic (congestive) heart failure: Secondary | ICD-10-CM

## 2020-03-27 NOTE — Progress Notes (Signed)
I connected by phone with Troy [name] daughter on 03/27/2020 at 9:25 PM to discuss the potential use of a new treatment for mild to moderate COVID-19 viral infection in non-hospitalized patients.  This patient is a 79 y.o. male that meets the FDA criteria for Emergency Use Authorization of COVID monoclonal antibody casirivimab/imdevimab.  Has a (+) direct SARS-CoV-2 viral test result  Has mild or moderate COVID-19   Is NOT hospitalized due to COVID-19  Is within 10 days of symptom onset  Has at least one of the high risk factor(s) for progression to severe COVID-19 and/or hospitalization as defined in EUA.  Specific high risk criteria : Chronic Kidney Disease (CKD)   I have spoken and communicated the following to the patient or parent/caregiver regarding COVID monoclonal antibody treatment:  1. FDA has authorized the emergency use for the treatment of mild to moderate COVID-19 in adults and pediatric patients with positive results of direct SARS-CoV-2 viral testing who are 20 years of age and older weighing at least 40 kg, and who are at high risk for progressing to severe COVID-19 and/or hospitalization.  2. The significant known and potential risks and benefits of COVID monoclonal antibody, and the extent to which such potential risks and benefits are unknown.  3. Information on available alternative treatments and the risks and benefits of those alternatives, including clinical trials.  4. Patients treated with COVID monoclonal antibody should continue to self-isolate and use infection control measures (e.g., wear mask, isolate, social distance, avoid sharing personal items, clean and disinfect "high touch" surfaces, and frequent handwashing) according to CDC guidelines.   5. The patient or parent/caregiver has the option to accept or refuse COVID monoclonal antibody treatment.  After reviewing this information with patient's daughter, agreed to treatment. Troy [name] 03/27/2020 9:25 PM

## 2020-03-28 ENCOUNTER — Ambulatory Visit (HOSPITAL_COMMUNITY)
Admission: RE | Admit: 2020-03-28 | Discharge: 2020-03-28 | Disposition: A | Discharge status: Home / Self Care | Payer: Medicare Other | Source: Ambulatory Visit | Attending: Pulmonary Disease | Admitting: Pulmonary Disease

## 2020-03-28 ENCOUNTER — Other Ambulatory Visit (HOSPITAL_COMMUNITY): Payer: Self-pay

## 2020-03-28 DIAGNOSIS — I10 Essential (primary) hypertension: Secondary | ICD-10-CM | POA: Insufficient documentation

## 2020-03-28 DIAGNOSIS — I5032 Chronic diastolic (congestive) heart failure: Secondary | ICD-10-CM | POA: Diagnosis present

## 2020-03-28 DIAGNOSIS — U071 COVID-19: Secondary | ICD-10-CM | POA: Insufficient documentation

## 2020-03-28 DIAGNOSIS — Z23 Encounter for immunization: Secondary | ICD-10-CM | POA: Insufficient documentation

## 2020-03-28 MED ORDER — EPINEPHRINE 0.3 MG/0.3ML IJ SOAJ: 0.3000 mg | Freq: Once | INTRAMUSCULAR | Status: DC | PRN

## 2020-03-28 MED ORDER — SODIUM CHLORIDE 0.9 % IV SOLN
1200.0000 mg | Freq: Once | INTRAVENOUS | Status: AC
  Administered 2020-03-28: 1200 mg via INTRAVENOUS

## 2020-03-28 MED ORDER — ALBUTEROL SULFATE HFA 108 (90 BASE) MCG/ACT IN AERS: 2.0000 | INHALATION_SPRAY | Freq: Once | RESPIRATORY_TRACT | Status: DC | PRN

## 2020-03-28 MED ORDER — DIPHENHYDRAMINE HCL 50 MG/ML IJ SOLN: 50.0000 mg | Freq: Once | INTRAMUSCULAR | Status: DC | PRN

## 2020-03-28 MED ORDER — SODIUM CHLORIDE 0.9 % IV SOLN: INTRAVENOUS | Status: DC | PRN

## 2020-03-28 MED ORDER — METHYLPREDNISOLONE SODIUM SUCC 125 MG IJ SOLR: 125.0000 mg | Freq: Once | INTRAMUSCULAR | Status: DC | PRN

## 2020-03-28 MED ORDER — FAMOTIDINE IN NACL 20-0.9 MG/50ML-% IV SOLN: 20.0000 mg | Freq: Once | INTRAVENOUS | Status: DC | PRN

## 2020-03-28 NOTE — Discharge Instructions (Signed)

## 2020-03-28 NOTE — Progress Notes (Signed)
  Diagnosis: COVID-19  Physician: Dr. Wright   Procedure: [facility] Med: casirivimab\imdevimab infusion - Provided patient with casirivimab\imdevimab fact sheet for patients, parents and caregivers prior to infusion.  Complications: No immediate complications noted.  Discharge: Discharged home   Troy Cooperman  [name] 03/28/2020   

## 2020-05-19 ENCOUNTER — Encounter: Payer: Self-pay | Admitting: General Practice

## 2020-05-19 ENCOUNTER — Telehealth: Payer: Self-pay | Admitting: Cardiology

## 2020-05-19 NOTE — Telephone Encounter (Signed)
  Recall expunge letter sent for overdue 3 mo f/u 

## 2020-07-11 DIAGNOSIS — N186 End stage renal disease: Secondary | ICD-10-CM | POA: Diagnosis not present

## 2020-07-11 DIAGNOSIS — R52 Pain, unspecified: Secondary | ICD-10-CM | POA: Diagnosis not present

## 2020-07-11 DIAGNOSIS — D631 Anemia in chronic kidney disease: Secondary | ICD-10-CM | POA: Diagnosis not present

## 2020-07-11 DIAGNOSIS — N2581 Secondary hyperparathyroidism of renal origin: Secondary | ICD-10-CM | POA: Diagnosis not present

## 2020-07-11 DIAGNOSIS — D688 Other specified coagulation defects: Secondary | ICD-10-CM | POA: Diagnosis not present

## 2020-07-11 DIAGNOSIS — Z992 Dependence on renal dialysis: Secondary | ICD-10-CM | POA: Diagnosis not present

## 2020-07-12 DIAGNOSIS — E782 Mixed hyperlipidemia: Secondary | ICD-10-CM | POA: Diagnosis not present

## 2020-07-12 DIAGNOSIS — I779 Disorder of arteries and arterioles, unspecified: Secondary | ICD-10-CM | POA: Diagnosis not present

## 2020-07-12 DIAGNOSIS — Z0000 Encounter for general adult medical examination without abnormal findings: Secondary | ICD-10-CM | POA: Diagnosis not present

## 2020-07-12 DIAGNOSIS — M109 Gout, unspecified: Secondary | ICD-10-CM | POA: Diagnosis not present

## 2020-07-12 DIAGNOSIS — R1310 Dysphagia, unspecified: Secondary | ICD-10-CM | POA: Diagnosis not present

## 2020-07-12 DIAGNOSIS — I110 Hypertensive heart disease with heart failure: Secondary | ICD-10-CM | POA: Diagnosis not present

## 2020-07-12 DIAGNOSIS — I2510 Atherosclerotic heart disease of native coronary artery without angina pectoris: Secondary | ICD-10-CM | POA: Diagnosis not present

## 2020-07-12 DIAGNOSIS — K5900 Constipation, unspecified: Secondary | ICD-10-CM | POA: Diagnosis not present

## 2020-07-12 DIAGNOSIS — I480 Paroxysmal atrial fibrillation: Secondary | ICD-10-CM | POA: Diagnosis not present

## 2020-07-12 DIAGNOSIS — I5032 Chronic diastolic (congestive) heart failure: Secondary | ICD-10-CM | POA: Diagnosis not present

## 2020-07-12 DIAGNOSIS — K625 Hemorrhage of anus and rectum: Secondary | ICD-10-CM | POA: Diagnosis not present

## 2020-07-12 DIAGNOSIS — E039 Hypothyroidism, unspecified: Secondary | ICD-10-CM | POA: Diagnosis not present

## 2020-07-12 DIAGNOSIS — N185 Chronic kidney disease, stage 5: Secondary | ICD-10-CM | POA: Diagnosis not present

## 2020-07-13 DIAGNOSIS — N2581 Secondary hyperparathyroidism of renal origin: Secondary | ICD-10-CM | POA: Diagnosis not present

## 2020-07-13 DIAGNOSIS — R52 Pain, unspecified: Secondary | ICD-10-CM | POA: Diagnosis not present

## 2020-07-13 DIAGNOSIS — D631 Anemia in chronic kidney disease: Secondary | ICD-10-CM | POA: Diagnosis not present

## 2020-07-13 DIAGNOSIS — N186 End stage renal disease: Secondary | ICD-10-CM | POA: Diagnosis not present

## 2020-07-13 DIAGNOSIS — Z992 Dependence on renal dialysis: Secondary | ICD-10-CM | POA: Diagnosis not present

## 2020-07-13 DIAGNOSIS — D688 Other specified coagulation defects: Secondary | ICD-10-CM | POA: Diagnosis not present

## 2020-07-15 ENCOUNTER — Other Ambulatory Visit (HOSPITAL_COMMUNITY): Payer: Self-pay

## 2020-07-15 DIAGNOSIS — R52 Pain, unspecified: Secondary | ICD-10-CM | POA: Diagnosis not present

## 2020-07-15 DIAGNOSIS — Z992 Dependence on renal dialysis: Secondary | ICD-10-CM | POA: Diagnosis not present

## 2020-07-15 DIAGNOSIS — D631 Anemia in chronic kidney disease: Secondary | ICD-10-CM | POA: Diagnosis not present

## 2020-07-15 DIAGNOSIS — N2581 Secondary hyperparathyroidism of renal origin: Secondary | ICD-10-CM | POA: Diagnosis not present

## 2020-07-15 DIAGNOSIS — R059 Cough, unspecified: Secondary | ICD-10-CM

## 2020-07-15 DIAGNOSIS — N186 End stage renal disease: Secondary | ICD-10-CM | POA: Diagnosis not present

## 2020-07-15 DIAGNOSIS — D688 Other specified coagulation defects: Secondary | ICD-10-CM | POA: Diagnosis not present

## 2020-07-15 DIAGNOSIS — R1310 Dysphagia, unspecified: Secondary | ICD-10-CM

## 2020-07-18 DIAGNOSIS — Z992 Dependence on renal dialysis: Secondary | ICD-10-CM | POA: Diagnosis not present

## 2020-07-18 DIAGNOSIS — D631 Anemia in chronic kidney disease: Secondary | ICD-10-CM | POA: Diagnosis not present

## 2020-07-18 DIAGNOSIS — R52 Pain, unspecified: Secondary | ICD-10-CM | POA: Diagnosis not present

## 2020-07-18 DIAGNOSIS — D688 Other specified coagulation defects: Secondary | ICD-10-CM | POA: Diagnosis not present

## 2020-07-18 DIAGNOSIS — N186 End stage renal disease: Secondary | ICD-10-CM | POA: Diagnosis not present

## 2020-07-18 DIAGNOSIS — N2581 Secondary hyperparathyroidism of renal origin: Secondary | ICD-10-CM | POA: Diagnosis not present

## 2020-07-20 DIAGNOSIS — N2581 Secondary hyperparathyroidism of renal origin: Secondary | ICD-10-CM | POA: Diagnosis not present

## 2020-07-20 DIAGNOSIS — D631 Anemia in chronic kidney disease: Secondary | ICD-10-CM | POA: Diagnosis not present

## 2020-07-20 DIAGNOSIS — N186 End stage renal disease: Secondary | ICD-10-CM | POA: Diagnosis not present

## 2020-07-20 DIAGNOSIS — R52 Pain, unspecified: Secondary | ICD-10-CM | POA: Diagnosis not present

## 2020-07-20 DIAGNOSIS — Z992 Dependence on renal dialysis: Secondary | ICD-10-CM | POA: Diagnosis not present

## 2020-07-20 DIAGNOSIS — D688 Other specified coagulation defects: Secondary | ICD-10-CM | POA: Diagnosis not present

## 2020-07-22 DIAGNOSIS — D631 Anemia in chronic kidney disease: Secondary | ICD-10-CM | POA: Diagnosis not present

## 2020-07-22 DIAGNOSIS — Z992 Dependence on renal dialysis: Secondary | ICD-10-CM | POA: Diagnosis not present

## 2020-07-22 DIAGNOSIS — N186 End stage renal disease: Secondary | ICD-10-CM | POA: Diagnosis not present

## 2020-07-22 DIAGNOSIS — R52 Pain, unspecified: Secondary | ICD-10-CM | POA: Diagnosis not present

## 2020-07-22 DIAGNOSIS — D688 Other specified coagulation defects: Secondary | ICD-10-CM | POA: Diagnosis not present

## 2020-07-22 DIAGNOSIS — N2581 Secondary hyperparathyroidism of renal origin: Secondary | ICD-10-CM | POA: Diagnosis not present

## 2020-07-25 DIAGNOSIS — R52 Pain, unspecified: Secondary | ICD-10-CM | POA: Diagnosis not present

## 2020-07-25 DIAGNOSIS — D688 Other specified coagulation defects: Secondary | ICD-10-CM | POA: Diagnosis not present

## 2020-07-25 DIAGNOSIS — D631 Anemia in chronic kidney disease: Secondary | ICD-10-CM | POA: Diagnosis not present

## 2020-07-25 DIAGNOSIS — N186 End stage renal disease: Secondary | ICD-10-CM | POA: Diagnosis not present

## 2020-07-25 DIAGNOSIS — Z992 Dependence on renal dialysis: Secondary | ICD-10-CM | POA: Diagnosis not present

## 2020-07-25 DIAGNOSIS — N2581 Secondary hyperparathyroidism of renal origin: Secondary | ICD-10-CM | POA: Diagnosis not present

## 2020-07-26 ENCOUNTER — Other Ambulatory Visit: Payer: Self-pay

## 2020-07-26 ENCOUNTER — Ambulatory Visit (HOSPITAL_COMMUNITY)
Admission: RE | Admit: 2020-07-26 | Discharge: 2020-07-26 | Disposition: A | Discharge status: Home / Self Care | Payer: Medicare Other | Source: Ambulatory Visit | Attending: Physician Assistant | Admitting: Physician Assistant

## 2020-07-26 DIAGNOSIS — R1310 Dysphagia, unspecified: Secondary | ICD-10-CM | POA: Insufficient documentation

## 2020-07-26 DIAGNOSIS — R059 Cough, unspecified: Secondary | ICD-10-CM | POA: Insufficient documentation

## 2020-07-26 IMAGING — RF DG SWALLOWING FUNCTION
13 series · 20 of 24 positions shown · non-contrast
Comparison: None.

CLINICAL DATA: Dysphagia. Cough/GE reflux disease/other secondary
diagnosis

EXAM:
MODIFIED BARIUM SWALLOW
TECHNIQUE: Different consistencies of barium were administered orally to the
patient by the Speech Pathologist. Imaging of the pharynx was
performed in the lateral projection. The radiologist was present in
the fluoroscopy room for this study, providing personal supervision.
FLUOROSCOPY TIME:  Fluoroscopy Time:  1 minutes and 12 seconds
Radiation Exposure Index (if provided by the fluoroscopic device):
2.8 mGy

[Series 1: cp_standard · 0.34mm/px · 2 of 61 frames shown (1 of 13)]
[frame 10/61]
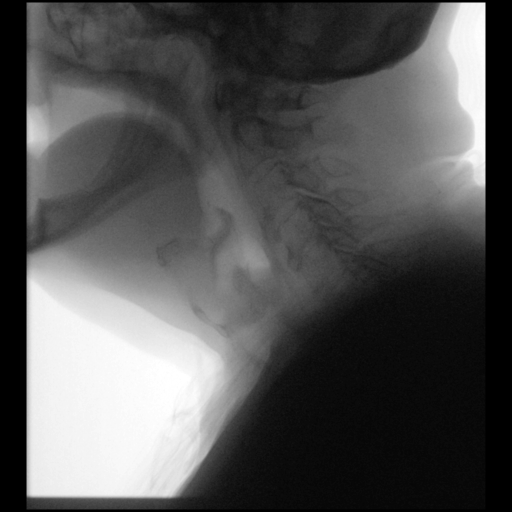
[frame 50/61]
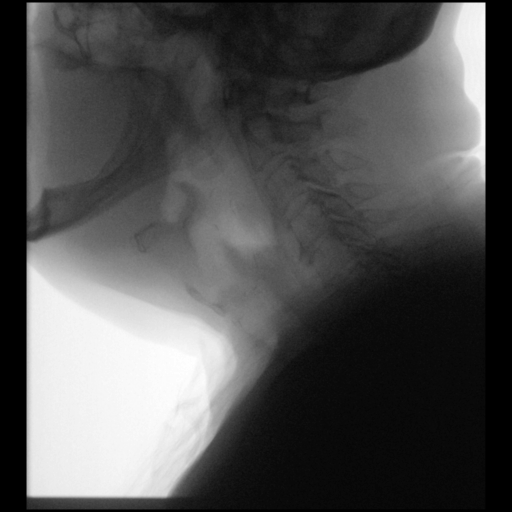

[Series 2: cp_standard · 0.34mm/px · 1 of 73 frames shown (2 of 13)]
[frame 63/73]
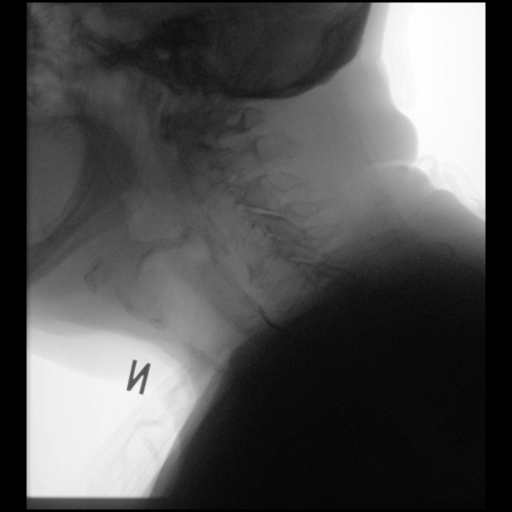

[Series 3: cp_standard · 0.34mm/px · 2 of 44 frames shown (3 of 13)]
[frame 23/44]
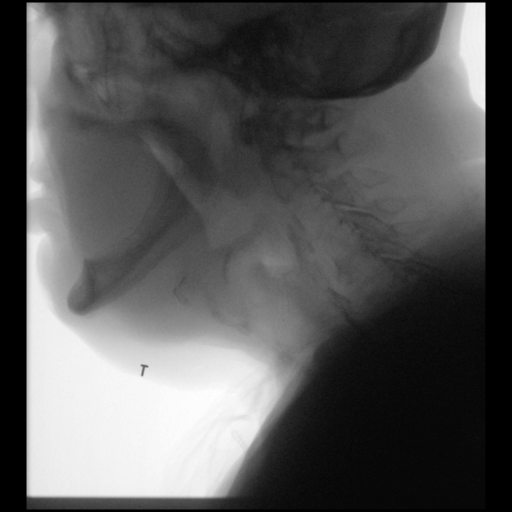
[frame 38/44]
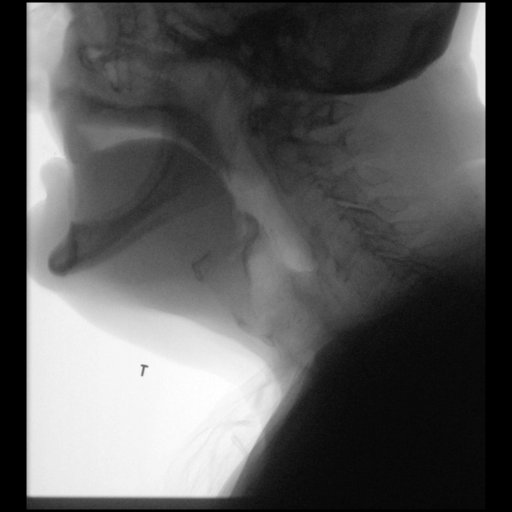

[Series 4: cp_standard · 0.34mm/px · 1 of 37 frames shown (4 of 13)]
[frame 19/37]
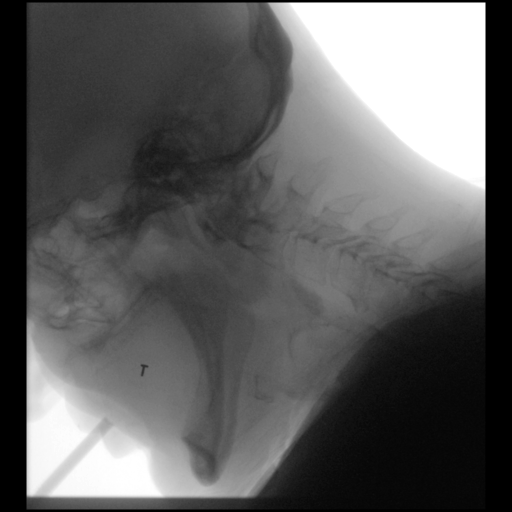

[Series 5: cp_standard · 0.34mm/px · 1 of 38 frames shown (5 of 13)]
[frame 6/38]
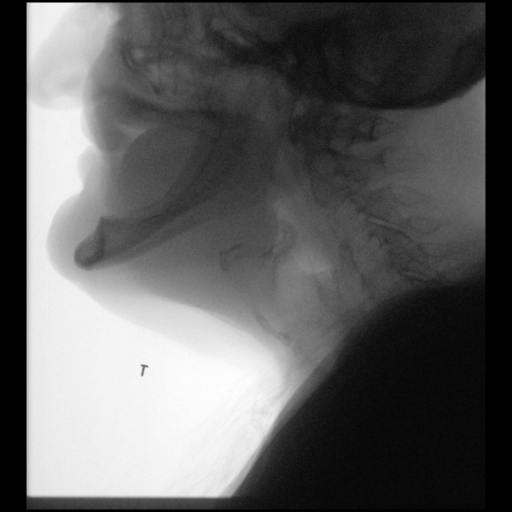

[Series 6: cp_standard · 0.34mm/px · 2 of 57 frames shown (6 of 13)]
[frame 9/57]
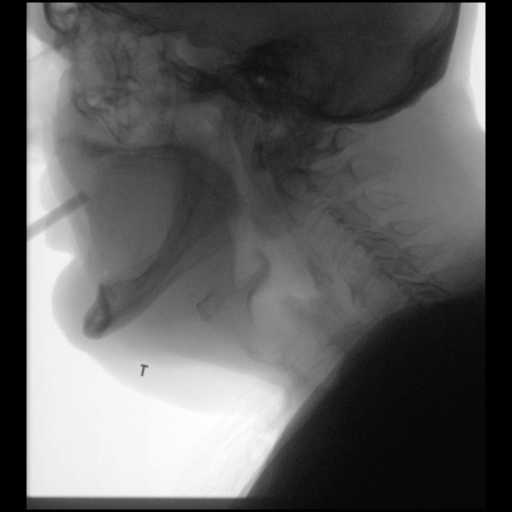
[frame 35/57]
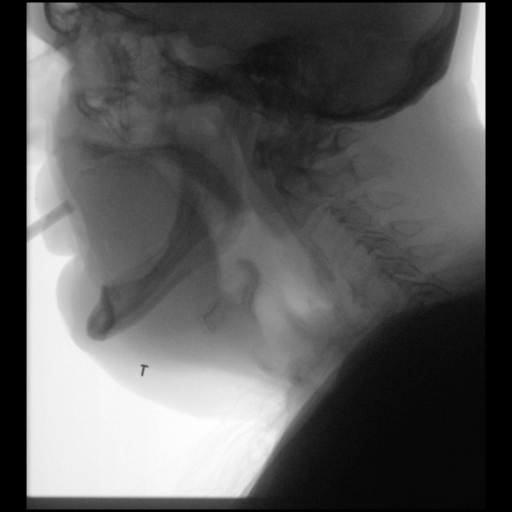

[Series 7: cp_standard · 0.51mm/px · 2 of 64 frames shown (7 of 13)]
[frame 10/64]
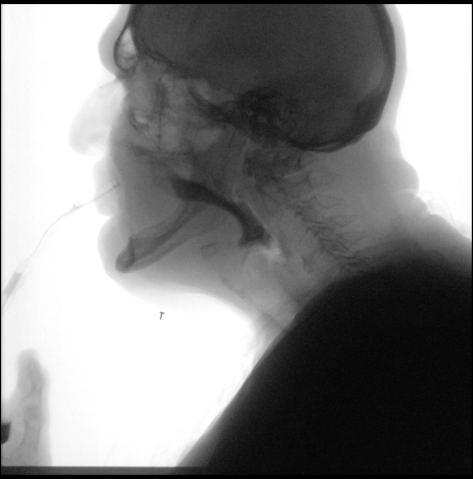
[frame 64/64]
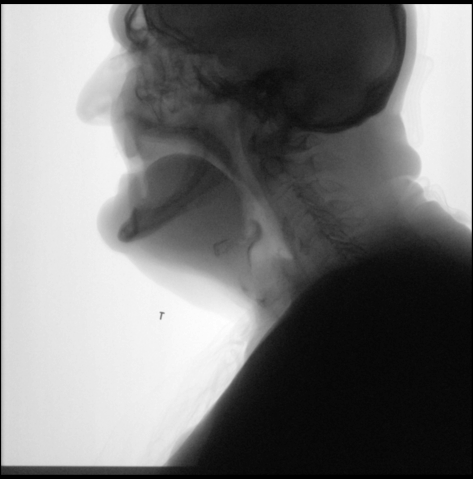

[Series 8: cp_standard · 0.34mm/px · 1 of 25 frames shown (8 of 13)]
[frame 9/25]
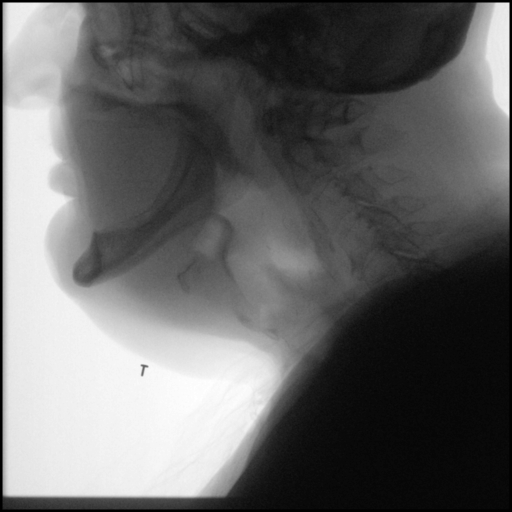

[Series 9: cp_standard · 0.34mm/px · 2 of 33 frames shown (9 of 13)]
[frame 17/33]
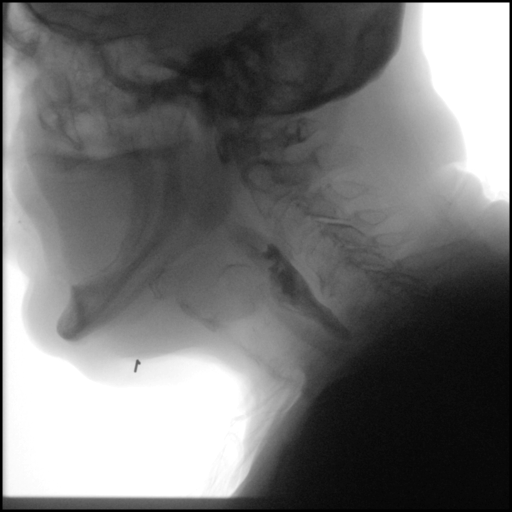
[frame 29/33]
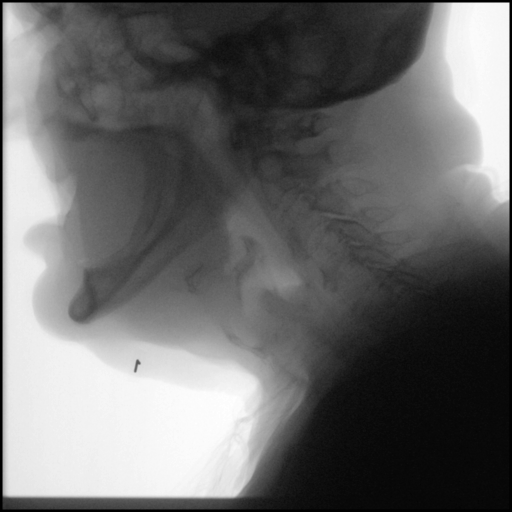

[Series 10: cp_standard · 0.34mm/px · 1 of 44 frames shown (10 of 13)]
[frame 23/44]
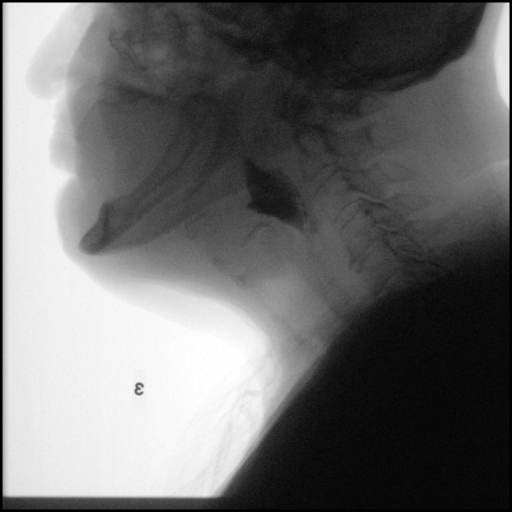

[Series 11: cp_standard · 0.34mm/px · 2 of 129 frames shown (11 of 13)]
[frame 20/129]
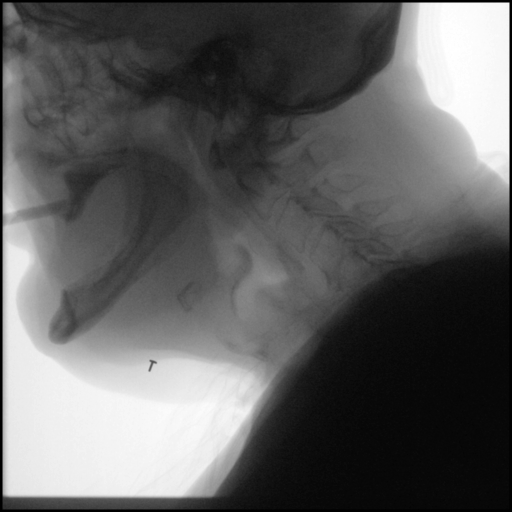
[frame 99/129]
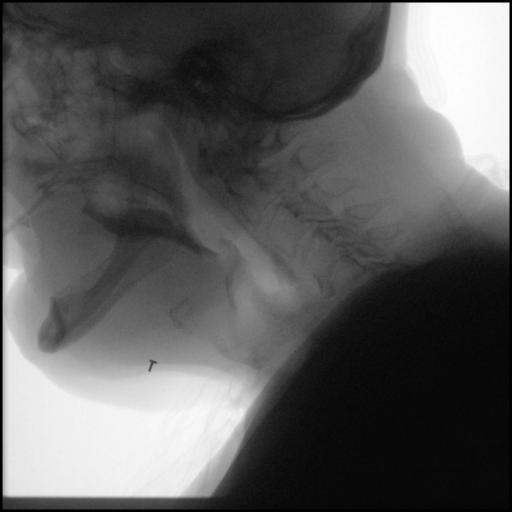

[Series 12: cp_standard · 0.34mm/px · 1 of 104 frames shown (12 of 13)]
[frame 89/104]
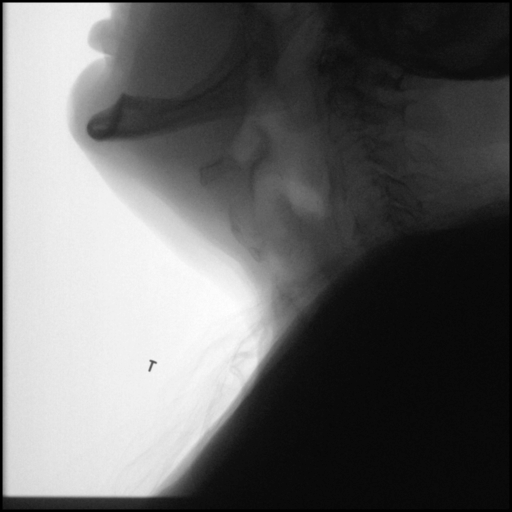

[Series 13: cp_standard · 0.51mm/px · 2 of 10 frames shown (13 of 13)]
[frame 2/10]
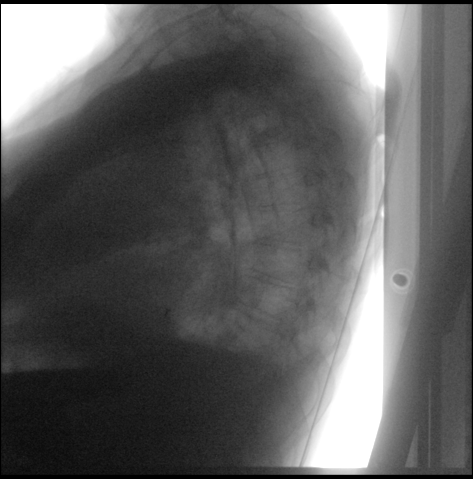
[frame 9/10]
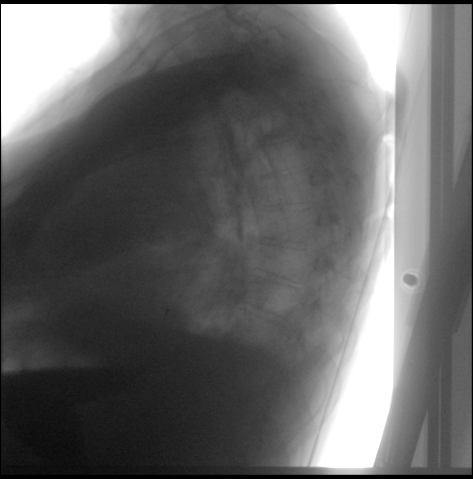

[20 of 24 positions shown; findings below may reference images not displayed]

FINDINGS/IMPRESSION:
Please refer to the Speech Pathologists report for complete details
and recommendations.

## 2020-07-27 DIAGNOSIS — D688 Other specified coagulation defects: Secondary | ICD-10-CM | POA: Diagnosis not present

## 2020-07-27 DIAGNOSIS — Z992 Dependence on renal dialysis: Secondary | ICD-10-CM | POA: Diagnosis not present

## 2020-07-27 DIAGNOSIS — N186 End stage renal disease: Secondary | ICD-10-CM | POA: Diagnosis not present

## 2020-07-27 DIAGNOSIS — D631 Anemia in chronic kidney disease: Secondary | ICD-10-CM | POA: Diagnosis not present

## 2020-07-27 DIAGNOSIS — R52 Pain, unspecified: Secondary | ICD-10-CM | POA: Diagnosis not present

## 2020-07-27 DIAGNOSIS — N2581 Secondary hyperparathyroidism of renal origin: Secondary | ICD-10-CM | POA: Diagnosis not present

## 2020-07-29 DIAGNOSIS — N186 End stage renal disease: Secondary | ICD-10-CM | POA: Diagnosis not present

## 2020-07-29 DIAGNOSIS — D631 Anemia in chronic kidney disease: Secondary | ICD-10-CM | POA: Diagnosis not present

## 2020-07-29 DIAGNOSIS — D688 Other specified coagulation defects: Secondary | ICD-10-CM | POA: Diagnosis not present

## 2020-07-29 DIAGNOSIS — R52 Pain, unspecified: Secondary | ICD-10-CM | POA: Diagnosis not present

## 2020-07-29 DIAGNOSIS — N2581 Secondary hyperparathyroidism of renal origin: Secondary | ICD-10-CM | POA: Diagnosis not present

## 2020-07-29 DIAGNOSIS — Z992 Dependence on renal dialysis: Secondary | ICD-10-CM | POA: Diagnosis not present

## 2020-08-01 DIAGNOSIS — N2581 Secondary hyperparathyroidism of renal origin: Secondary | ICD-10-CM | POA: Diagnosis not present

## 2020-08-01 DIAGNOSIS — R52 Pain, unspecified: Secondary | ICD-10-CM | POA: Diagnosis not present

## 2020-08-01 DIAGNOSIS — D631 Anemia in chronic kidney disease: Secondary | ICD-10-CM | POA: Diagnosis not present

## 2020-08-01 DIAGNOSIS — N186 End stage renal disease: Secondary | ICD-10-CM | POA: Diagnosis not present

## 2020-08-01 DIAGNOSIS — D688 Other specified coagulation defects: Secondary | ICD-10-CM | POA: Diagnosis not present

## 2020-08-01 DIAGNOSIS — Z992 Dependence on renal dialysis: Secondary | ICD-10-CM | POA: Diagnosis not present

## 2020-08-03 DIAGNOSIS — D688 Other specified coagulation defects: Secondary | ICD-10-CM | POA: Diagnosis not present

## 2020-08-03 DIAGNOSIS — N2581 Secondary hyperparathyroidism of renal origin: Secondary | ICD-10-CM | POA: Diagnosis not present

## 2020-08-03 DIAGNOSIS — R52 Pain, unspecified: Secondary | ICD-10-CM | POA: Diagnosis not present

## 2020-08-03 DIAGNOSIS — Z992 Dependence on renal dialysis: Secondary | ICD-10-CM | POA: Diagnosis not present

## 2020-08-03 DIAGNOSIS — N186 End stage renal disease: Secondary | ICD-10-CM | POA: Diagnosis not present

## 2020-08-03 DIAGNOSIS — D631 Anemia in chronic kidney disease: Secondary | ICD-10-CM | POA: Diagnosis not present

## 2020-08-05 DIAGNOSIS — N2581 Secondary hyperparathyroidism of renal origin: Secondary | ICD-10-CM | POA: Diagnosis not present

## 2020-08-05 DIAGNOSIS — D631 Anemia in chronic kidney disease: Secondary | ICD-10-CM | POA: Diagnosis not present

## 2020-08-05 DIAGNOSIS — R52 Pain, unspecified: Secondary | ICD-10-CM | POA: Diagnosis not present

## 2020-08-05 DIAGNOSIS — N186 End stage renal disease: Secondary | ICD-10-CM | POA: Diagnosis not present

## 2020-08-05 DIAGNOSIS — Z992 Dependence on renal dialysis: Secondary | ICD-10-CM | POA: Diagnosis not present

## 2020-08-05 DIAGNOSIS — D688 Other specified coagulation defects: Secondary | ICD-10-CM | POA: Diagnosis not present

## 2020-08-08 DIAGNOSIS — N186 End stage renal disease: Secondary | ICD-10-CM | POA: Diagnosis not present

## 2020-08-08 DIAGNOSIS — R52 Pain, unspecified: Secondary | ICD-10-CM | POA: Diagnosis not present

## 2020-08-08 DIAGNOSIS — N2581 Secondary hyperparathyroidism of renal origin: Secondary | ICD-10-CM | POA: Diagnosis not present

## 2020-08-08 DIAGNOSIS — D631 Anemia in chronic kidney disease: Secondary | ICD-10-CM | POA: Diagnosis not present

## 2020-08-08 DIAGNOSIS — I129 Hypertensive chronic kidney disease with stage 1 through stage 4 chronic kidney disease, or unspecified chronic kidney disease: Secondary | ICD-10-CM | POA: Diagnosis not present

## 2020-08-08 DIAGNOSIS — Z992 Dependence on renal dialysis: Secondary | ICD-10-CM | POA: Diagnosis not present

## 2020-08-08 DIAGNOSIS — D688 Other specified coagulation defects: Secondary | ICD-10-CM | POA: Diagnosis not present

## 2020-08-09 ENCOUNTER — Telehealth: Payer: Self-pay

## 2020-08-09 NOTE — Telephone Encounter (Signed)
Attempted to contact patient to schedule a Palliative Care consult appointment. No answer left a message to return call.  

## 2020-08-10 DIAGNOSIS — N2581 Secondary hyperparathyroidism of renal origin: Secondary | ICD-10-CM | POA: Diagnosis not present

## 2020-08-10 DIAGNOSIS — D688 Other specified coagulation defects: Secondary | ICD-10-CM | POA: Diagnosis not present

## 2020-08-10 DIAGNOSIS — D631 Anemia in chronic kidney disease: Secondary | ICD-10-CM | POA: Diagnosis not present

## 2020-08-10 DIAGNOSIS — D509 Iron deficiency anemia, unspecified: Secondary | ICD-10-CM | POA: Diagnosis not present

## 2020-08-10 DIAGNOSIS — Z992 Dependence on renal dialysis: Secondary | ICD-10-CM | POA: Diagnosis not present

## 2020-08-10 DIAGNOSIS — N186 End stage renal disease: Secondary | ICD-10-CM | POA: Diagnosis not present

## 2020-08-10 DIAGNOSIS — R52 Pain, unspecified: Secondary | ICD-10-CM | POA: Diagnosis not present

## 2020-08-12 ENCOUNTER — Telehealth: Payer: Self-pay

## 2020-08-12 DIAGNOSIS — N2581 Secondary hyperparathyroidism of renal origin: Secondary | ICD-10-CM | POA: Diagnosis not present

## 2020-08-12 DIAGNOSIS — D509 Iron deficiency anemia, unspecified: Secondary | ICD-10-CM | POA: Diagnosis not present

## 2020-08-12 DIAGNOSIS — D688 Other specified coagulation defects: Secondary | ICD-10-CM | POA: Diagnosis not present

## 2020-08-12 DIAGNOSIS — Z992 Dependence on renal dialysis: Secondary | ICD-10-CM | POA: Diagnosis not present

## 2020-08-12 DIAGNOSIS — D631 Anemia in chronic kidney disease: Secondary | ICD-10-CM | POA: Diagnosis not present

## 2020-08-12 DIAGNOSIS — R52 Pain, unspecified: Secondary | ICD-10-CM | POA: Diagnosis not present

## 2020-08-12 DIAGNOSIS — N186 End stage renal disease: Secondary | ICD-10-CM | POA: Diagnosis not present

## 2020-08-12 NOTE — Telephone Encounter (Signed)
Spoke with patient's daughter [name] and scheduled an in-person Palliative Consult for 09/01/20 @ 11:30AM  COVID screening was negative. Two small dogs in the home, will put away before NP arrives. Patient lives with husband and daughter comes in the afternoons.  Consent obtained; updated Outlook/Netsmart/Team List and Epic.  Family is aware they will be receiving a call from NP the day before or day of to confirm appointment.

## 2020-08-12 NOTE — Telephone Encounter (Signed)
Attempted to contact patient's daughter Michelle to schedule a Palliative Care consult appointment. No answer left a message to return call.  

## 2020-08-15 DIAGNOSIS — D631 Anemia in chronic kidney disease: Secondary | ICD-10-CM | POA: Diagnosis not present

## 2020-08-15 DIAGNOSIS — Z992 Dependence on renal dialysis: Secondary | ICD-10-CM | POA: Diagnosis not present

## 2020-08-15 DIAGNOSIS — N186 End stage renal disease: Secondary | ICD-10-CM | POA: Diagnosis not present

## 2020-08-15 DIAGNOSIS — N2581 Secondary hyperparathyroidism of renal origin: Secondary | ICD-10-CM | POA: Diagnosis not present

## 2020-08-15 DIAGNOSIS — D509 Iron deficiency anemia, unspecified: Secondary | ICD-10-CM | POA: Diagnosis not present

## 2020-08-15 DIAGNOSIS — D688 Other specified coagulation defects: Secondary | ICD-10-CM | POA: Diagnosis not present

## 2020-08-15 DIAGNOSIS — R52 Pain, unspecified: Secondary | ICD-10-CM | POA: Diagnosis not present

## 2020-08-17 DIAGNOSIS — D509 Iron deficiency anemia, unspecified: Secondary | ICD-10-CM | POA: Diagnosis not present

## 2020-08-17 DIAGNOSIS — R52 Pain, unspecified: Secondary | ICD-10-CM | POA: Diagnosis not present

## 2020-08-17 DIAGNOSIS — Z992 Dependence on renal dialysis: Secondary | ICD-10-CM | POA: Diagnosis not present

## 2020-08-17 DIAGNOSIS — N2581 Secondary hyperparathyroidism of renal origin: Secondary | ICD-10-CM | POA: Diagnosis not present

## 2020-08-17 DIAGNOSIS — D631 Anemia in chronic kidney disease: Secondary | ICD-10-CM | POA: Diagnosis not present

## 2020-08-17 DIAGNOSIS — D688 Other specified coagulation defects: Secondary | ICD-10-CM | POA: Diagnosis not present

## 2020-08-17 DIAGNOSIS — N186 End stage renal disease: Secondary | ICD-10-CM | POA: Diagnosis not present

## 2020-08-19 DIAGNOSIS — Z992 Dependence on renal dialysis: Secondary | ICD-10-CM | POA: Diagnosis not present

## 2020-08-19 DIAGNOSIS — N186 End stage renal disease: Secondary | ICD-10-CM | POA: Diagnosis not present

## 2020-08-19 DIAGNOSIS — D509 Iron deficiency anemia, unspecified: Secondary | ICD-10-CM | POA: Diagnosis not present

## 2020-08-19 DIAGNOSIS — N2581 Secondary hyperparathyroidism of renal origin: Secondary | ICD-10-CM | POA: Diagnosis not present

## 2020-08-19 DIAGNOSIS — D631 Anemia in chronic kidney disease: Secondary | ICD-10-CM | POA: Diagnosis not present

## 2020-08-19 DIAGNOSIS — D688 Other specified coagulation defects: Secondary | ICD-10-CM | POA: Diagnosis not present

## 2020-08-19 DIAGNOSIS — R52 Pain, unspecified: Secondary | ICD-10-CM | POA: Diagnosis not present

## 2020-08-22 DIAGNOSIS — Z992 Dependence on renal dialysis: Secondary | ICD-10-CM | POA: Diagnosis not present

## 2020-08-22 DIAGNOSIS — N2581 Secondary hyperparathyroidism of renal origin: Secondary | ICD-10-CM | POA: Diagnosis not present

## 2020-08-22 DIAGNOSIS — D509 Iron deficiency anemia, unspecified: Secondary | ICD-10-CM | POA: Diagnosis not present

## 2020-08-22 DIAGNOSIS — D688 Other specified coagulation defects: Secondary | ICD-10-CM | POA: Diagnosis not present

## 2020-08-22 DIAGNOSIS — D631 Anemia in chronic kidney disease: Secondary | ICD-10-CM | POA: Diagnosis not present

## 2020-08-22 DIAGNOSIS — N186 End stage renal disease: Secondary | ICD-10-CM | POA: Diagnosis not present

## 2020-08-22 DIAGNOSIS — R52 Pain, unspecified: Secondary | ICD-10-CM | POA: Diagnosis not present

## 2020-08-24 DIAGNOSIS — Z992 Dependence on renal dialysis: Secondary | ICD-10-CM | POA: Diagnosis not present

## 2020-08-24 DIAGNOSIS — N2581 Secondary hyperparathyroidism of renal origin: Secondary | ICD-10-CM | POA: Diagnosis not present

## 2020-08-24 DIAGNOSIS — D688 Other specified coagulation defects: Secondary | ICD-10-CM | POA: Diagnosis not present

## 2020-08-24 DIAGNOSIS — D509 Iron deficiency anemia, unspecified: Secondary | ICD-10-CM | POA: Diagnosis not present

## 2020-08-24 DIAGNOSIS — R52 Pain, unspecified: Secondary | ICD-10-CM | POA: Diagnosis not present

## 2020-08-24 DIAGNOSIS — N186 End stage renal disease: Secondary | ICD-10-CM | POA: Diagnosis not present

## 2020-08-24 DIAGNOSIS — D631 Anemia in chronic kidney disease: Secondary | ICD-10-CM | POA: Diagnosis not present

## 2020-08-26 DIAGNOSIS — N186 End stage renal disease: Secondary | ICD-10-CM | POA: Diagnosis not present

## 2020-08-26 DIAGNOSIS — D631 Anemia in chronic kidney disease: Secondary | ICD-10-CM | POA: Diagnosis not present

## 2020-08-26 DIAGNOSIS — N2581 Secondary hyperparathyroidism of renal origin: Secondary | ICD-10-CM | POA: Diagnosis not present

## 2020-08-26 DIAGNOSIS — R52 Pain, unspecified: Secondary | ICD-10-CM | POA: Diagnosis not present

## 2020-08-26 DIAGNOSIS — Z992 Dependence on renal dialysis: Secondary | ICD-10-CM | POA: Diagnosis not present

## 2020-08-26 DIAGNOSIS — D688 Other specified coagulation defects: Secondary | ICD-10-CM | POA: Diagnosis not present

## 2020-08-26 DIAGNOSIS — D509 Iron deficiency anemia, unspecified: Secondary | ICD-10-CM | POA: Diagnosis not present

## 2020-08-29 DIAGNOSIS — D688 Other specified coagulation defects: Secondary | ICD-10-CM | POA: Diagnosis not present

## 2020-08-29 DIAGNOSIS — N2581 Secondary hyperparathyroidism of renal origin: Secondary | ICD-10-CM | POA: Diagnosis not present

## 2020-08-29 DIAGNOSIS — R52 Pain, unspecified: Secondary | ICD-10-CM | POA: Diagnosis not present

## 2020-08-29 DIAGNOSIS — N186 End stage renal disease: Secondary | ICD-10-CM | POA: Diagnosis not present

## 2020-08-29 DIAGNOSIS — Z992 Dependence on renal dialysis: Secondary | ICD-10-CM | POA: Diagnosis not present

## 2020-08-29 DIAGNOSIS — D631 Anemia in chronic kidney disease: Secondary | ICD-10-CM | POA: Diagnosis not present

## 2020-08-29 DIAGNOSIS — D509 Iron deficiency anemia, unspecified: Secondary | ICD-10-CM | POA: Diagnosis not present

## 2020-08-31 DIAGNOSIS — N186 End stage renal disease: Secondary | ICD-10-CM | POA: Diagnosis not present

## 2020-08-31 DIAGNOSIS — N2581 Secondary hyperparathyroidism of renal origin: Secondary | ICD-10-CM | POA: Diagnosis not present

## 2020-08-31 DIAGNOSIS — Z992 Dependence on renal dialysis: Secondary | ICD-10-CM | POA: Diagnosis not present

## 2020-08-31 DIAGNOSIS — R52 Pain, unspecified: Secondary | ICD-10-CM | POA: Diagnosis not present

## 2020-08-31 DIAGNOSIS — D631 Anemia in chronic kidney disease: Secondary | ICD-10-CM | POA: Diagnosis not present

## 2020-08-31 DIAGNOSIS — D688 Other specified coagulation defects: Secondary | ICD-10-CM | POA: Diagnosis not present

## 2020-08-31 DIAGNOSIS — D509 Iron deficiency anemia, unspecified: Secondary | ICD-10-CM | POA: Diagnosis not present

## 2020-09-01 ENCOUNTER — Other Ambulatory Visit: Payer: Self-pay

## 2020-09-01 ENCOUNTER — Other Ambulatory Visit: Payer: Medicare Other | Admitting: Hospice

## 2020-09-01 DIAGNOSIS — F0390 Unspecified dementia without behavioral disturbance: Secondary | ICD-10-CM

## 2020-09-01 DIAGNOSIS — Z515 Encounter for palliative care: Secondary | ICD-10-CM | POA: Diagnosis not present

## 2020-09-01 DIAGNOSIS — M545 Low back pain, unspecified: Secondary | ICD-10-CM

## 2020-09-01 DIAGNOSIS — G8929 Other chronic pain: Secondary | ICD-10-CM
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
## 2020-09-02 DIAGNOSIS — D509 Iron deficiency anemia, unspecified: Secondary | ICD-10-CM | POA: Diagnosis not present

## 2020-09-02 DIAGNOSIS — D631 Anemia in chronic kidney disease: Secondary | ICD-10-CM | POA: Diagnosis not present

## 2020-09-02 DIAGNOSIS — D688 Other specified coagulation defects: Secondary | ICD-10-CM | POA: Diagnosis not present

## 2020-09-02 DIAGNOSIS — N2581 Secondary hyperparathyroidism of renal origin: Secondary | ICD-10-CM | POA: Diagnosis not present

## 2020-09-02 DIAGNOSIS — Z992 Dependence on renal dialysis: Secondary | ICD-10-CM | POA: Diagnosis not present

## 2020-09-02 DIAGNOSIS — N186 End stage renal disease: Secondary | ICD-10-CM | POA: Diagnosis not present

## 2020-09-02 DIAGNOSIS — R52 Pain, unspecified: Secondary | ICD-10-CM | POA: Diagnosis not present

## 2020-09-05 DIAGNOSIS — D631 Anemia in chronic kidney disease: Secondary | ICD-10-CM | POA: Diagnosis not present

## 2020-09-05 DIAGNOSIS — N186 End stage renal disease: Secondary | ICD-10-CM | POA: Diagnosis not present

## 2020-09-05 DIAGNOSIS — I129 Hypertensive chronic kidney disease with stage 1 through stage 4 chronic kidney disease, or unspecified chronic kidney disease: Secondary | ICD-10-CM | POA: Diagnosis not present

## 2020-09-05 DIAGNOSIS — D509 Iron deficiency anemia, unspecified: Secondary | ICD-10-CM | POA: Diagnosis not present

## 2020-09-05 DIAGNOSIS — D688 Other specified coagulation defects: Secondary | ICD-10-CM | POA: Diagnosis not present

## 2020-09-05 DIAGNOSIS — R52 Pain, unspecified: Secondary | ICD-10-CM | POA: Diagnosis not present

## 2020-09-05 DIAGNOSIS — Z992 Dependence on renal dialysis: Secondary | ICD-10-CM | POA: Diagnosis not present

## 2020-09-05 DIAGNOSIS — N2581 Secondary hyperparathyroidism of renal origin: Secondary | ICD-10-CM | POA: Diagnosis not present

## 2020-09-07 DIAGNOSIS — Z992 Dependence on renal dialysis: Secondary | ICD-10-CM | POA: Diagnosis not present

## 2020-09-07 DIAGNOSIS — N186 End stage renal disease: Secondary | ICD-10-CM | POA: Diagnosis not present

## 2020-09-07 DIAGNOSIS — D688 Other specified coagulation defects: Secondary | ICD-10-CM | POA: Diagnosis not present

## 2020-09-07 DIAGNOSIS — N2581 Secondary hyperparathyroidism of renal origin: Secondary | ICD-10-CM | POA: Diagnosis not present

## 2020-09-08 DIAGNOSIS — R413 Other amnesia: Secondary | ICD-10-CM | POA: Diagnosis not present

## 2020-09-08 DIAGNOSIS — N185 Chronic kidney disease, stage 5: Secondary | ICD-10-CM | POA: Diagnosis not present

## 2020-09-09 DIAGNOSIS — D688 Other specified coagulation defects: Secondary | ICD-10-CM | POA: Diagnosis not present

## 2020-09-09 DIAGNOSIS — N2581 Secondary hyperparathyroidism of renal origin: Secondary | ICD-10-CM | POA: Diagnosis not present

## 2020-09-09 DIAGNOSIS — Z992 Dependence on renal dialysis: Secondary | ICD-10-CM | POA: Diagnosis not present

## 2020-09-09 DIAGNOSIS — N186 End stage renal disease: Secondary | ICD-10-CM | POA: Diagnosis not present

## 2020-09-12 DIAGNOSIS — R52 Pain, unspecified: Secondary | ICD-10-CM | POA: Diagnosis not present

## 2020-09-12 DIAGNOSIS — N186 End stage renal disease: Secondary | ICD-10-CM | POA: Diagnosis not present

## 2020-09-12 DIAGNOSIS — Z992 Dependence on renal dialysis: Secondary | ICD-10-CM | POA: Diagnosis not present

## 2020-09-12 DIAGNOSIS — N2581 Secondary hyperparathyroidism of renal origin: Secondary | ICD-10-CM | POA: Diagnosis not present

## 2020-09-12 DIAGNOSIS — D688 Other specified coagulation defects: Secondary | ICD-10-CM | POA: Diagnosis not present

## 2020-09-14 DIAGNOSIS — N186 End stage renal disease: Secondary | ICD-10-CM | POA: Diagnosis not present

## 2020-09-14 DIAGNOSIS — R52 Pain, unspecified: Secondary | ICD-10-CM | POA: Diagnosis not present

## 2020-09-14 DIAGNOSIS — Z992 Dependence on renal dialysis: Secondary | ICD-10-CM | POA: Diagnosis not present

## 2020-09-14 DIAGNOSIS — N2581 Secondary hyperparathyroidism of renal origin: Secondary | ICD-10-CM | POA: Diagnosis not present

## 2020-09-14 DIAGNOSIS — D688 Other specified coagulation defects: Secondary | ICD-10-CM | POA: Diagnosis not present

## 2020-09-16 DIAGNOSIS — N186 End stage renal disease: Secondary | ICD-10-CM | POA: Diagnosis not present

## 2020-09-16 DIAGNOSIS — D688 Other specified coagulation defects: Secondary | ICD-10-CM | POA: Diagnosis not present

## 2020-09-16 DIAGNOSIS — N2581 Secondary hyperparathyroidism of renal origin: Secondary | ICD-10-CM | POA: Diagnosis not present

## 2020-09-16 DIAGNOSIS — R52 Pain, unspecified: Secondary | ICD-10-CM | POA: Diagnosis not present

## 2020-09-16 DIAGNOSIS — Z992 Dependence on renal dialysis: Secondary | ICD-10-CM | POA: Diagnosis not present

## 2020-09-19 DIAGNOSIS — N2581 Secondary hyperparathyroidism of renal origin: Secondary | ICD-10-CM | POA: Diagnosis not present

## 2020-09-19 DIAGNOSIS — Z992 Dependence on renal dialysis: Secondary | ICD-10-CM | POA: Diagnosis not present

## 2020-09-19 DIAGNOSIS — D688 Other specified coagulation defects: Secondary | ICD-10-CM | POA: Diagnosis not present

## 2020-09-19 DIAGNOSIS — R52 Pain, unspecified: Secondary | ICD-10-CM | POA: Diagnosis not present

## 2020-09-19 DIAGNOSIS — N186 End stage renal disease: Secondary | ICD-10-CM | POA: Diagnosis not present

## 2020-09-21 DIAGNOSIS — R52 Pain, unspecified: Secondary | ICD-10-CM | POA: Diagnosis not present

## 2020-09-21 DIAGNOSIS — N2581 Secondary hyperparathyroidism of renal origin: Secondary | ICD-10-CM | POA: Diagnosis not present

## 2020-09-21 DIAGNOSIS — N186 End stage renal disease: Secondary | ICD-10-CM | POA: Diagnosis not present

## 2020-09-21 DIAGNOSIS — Z992 Dependence on renal dialysis: Secondary | ICD-10-CM | POA: Diagnosis not present

## 2020-09-21 DIAGNOSIS — D688 Other specified coagulation defects: Secondary | ICD-10-CM | POA: Diagnosis not present

## 2020-09-23 DIAGNOSIS — Z992 Dependence on renal dialysis: Secondary | ICD-10-CM | POA: Diagnosis not present

## 2020-09-23 DIAGNOSIS — D688 Other specified coagulation defects: Secondary | ICD-10-CM | POA: Diagnosis not present

## 2020-09-23 DIAGNOSIS — N186 End stage renal disease: Secondary | ICD-10-CM | POA: Diagnosis not present

## 2020-09-23 DIAGNOSIS — R52 Pain, unspecified: Secondary | ICD-10-CM | POA: Diagnosis not present

## 2020-09-23 DIAGNOSIS — N2581 Secondary hyperparathyroidism of renal origin: Secondary | ICD-10-CM | POA: Diagnosis not present

## 2020-09-26 ENCOUNTER — Encounter (HOSPITAL_COMMUNITY): Payer: Self-pay | Admitting: Pharmacy Technician

## 2020-09-26 ENCOUNTER — Inpatient Hospital Stay (HOSPITAL_COMMUNITY)
Admission: EM | Admit: 2020-09-26 | Discharge: 2020-09-29 | DRG: 291 | Disposition: A | Discharge status: Home Health | Payer: No Typology Code available for payment source | Attending: Internal Medicine | Admitting: Internal Medicine

## 2020-09-26 ENCOUNTER — Emergency Department (HOSPITAL_COMMUNITY): Payer: No Typology Code available for payment source

## 2020-09-26 DIAGNOSIS — R0902 Hypoxemia: Secondary | ICD-10-CM | POA: Diagnosis not present

## 2020-09-26 DIAGNOSIS — J9811 Atelectasis: Secondary | ICD-10-CM | POA: Diagnosis present

## 2020-09-26 DIAGNOSIS — I252 Old myocardial infarction: Secondary | ICD-10-CM

## 2020-09-26 DIAGNOSIS — R0602 Shortness of breath: Secondary | ICD-10-CM | POA: Diagnosis not present

## 2020-09-26 DIAGNOSIS — I480 Paroxysmal atrial fibrillation: Secondary | ICD-10-CM | POA: Diagnosis present

## 2020-09-26 DIAGNOSIS — I132 Hypertensive heart and chronic kidney disease with heart failure and with stage 5 chronic kidney disease, or end stage renal disease: Principal | ICD-10-CM | POA: Diagnosis present

## 2020-09-26 DIAGNOSIS — R0600 Dyspnea, unspecified: Secondary | ICD-10-CM | POA: Diagnosis not present

## 2020-09-26 DIAGNOSIS — Z8249 Family history of ischemic heart disease and other diseases of the circulatory system: Secondary | ICD-10-CM

## 2020-09-26 DIAGNOSIS — I160 Hypertensive urgency: Secondary | ICD-10-CM | POA: Diagnosis present

## 2020-09-26 DIAGNOSIS — K219 Gastro-esophageal reflux disease without esophagitis: Secondary | ICD-10-CM | POA: Diagnosis present

## 2020-09-26 DIAGNOSIS — F32A Depression, unspecified: Secondary | ICD-10-CM | POA: Diagnosis present

## 2020-09-26 DIAGNOSIS — R778 Other specified abnormalities of plasma proteins: Secondary | ICD-10-CM | POA: Diagnosis not present

## 2020-09-26 DIAGNOSIS — I10 Essential (primary) hypertension: Secondary | ICD-10-CM | POA: Diagnosis present

## 2020-09-26 DIAGNOSIS — H9190 Unspecified hearing loss, unspecified ear: Secondary | ICD-10-CM | POA: Diagnosis present

## 2020-09-26 DIAGNOSIS — M109 Gout, unspecified: Secondary | ICD-10-CM | POA: Diagnosis present

## 2020-09-26 DIAGNOSIS — D631 Anemia in chronic kidney disease: Secondary | ICD-10-CM | POA: Diagnosis present

## 2020-09-26 DIAGNOSIS — I350 Nonrheumatic aortic (valve) stenosis: Secondary | ICD-10-CM

## 2020-09-26 DIAGNOSIS — I5043 Acute on chronic combined systolic (congestive) and diastolic (congestive) heart failure: Secondary | ICD-10-CM | POA: Diagnosis present

## 2020-09-26 DIAGNOSIS — Z888 Allergy status to other drugs, medicaments and biological substances status: Secondary | ICD-10-CM

## 2020-09-26 DIAGNOSIS — Z882 Allergy status to sulfonamides status: Secondary | ICD-10-CM

## 2020-09-26 DIAGNOSIS — E8770 Fluid overload, unspecified: Secondary | ICD-10-CM | POA: Diagnosis present

## 2020-09-26 DIAGNOSIS — Z992 Dependence on renal dialysis: Secondary | ICD-10-CM

## 2020-09-26 DIAGNOSIS — Z743 Need for continuous supervision: Secondary | ICD-10-CM | POA: Diagnosis not present

## 2020-09-26 DIAGNOSIS — R296 Repeated falls: Secondary | ICD-10-CM | POA: Diagnosis present

## 2020-09-26 DIAGNOSIS — N186 End stage renal disease: Secondary | ICD-10-CM | POA: Diagnosis present

## 2020-09-26 DIAGNOSIS — J90 Pleural effusion, not elsewhere classified: Secondary | ICD-10-CM | POA: Diagnosis not present

## 2020-09-26 DIAGNOSIS — E039 Hypothyroidism, unspecified: Secondary | ICD-10-CM | POA: Diagnosis present

## 2020-09-26 DIAGNOSIS — R0609 Other forms of dyspnea: Secondary | ICD-10-CM

## 2020-09-26 DIAGNOSIS — R5381 Other malaise: Secondary | ICD-10-CM | POA: Diagnosis present

## 2020-09-26 DIAGNOSIS — Z8673 Personal history of transient ischemic attack (TIA), and cerebral infarction without residual deficits: Secondary | ICD-10-CM

## 2020-09-26 DIAGNOSIS — I5023 Acute on chronic systolic (congestive) heart failure: Secondary | ICD-10-CM | POA: Diagnosis not present

## 2020-09-26 DIAGNOSIS — I509 Heart failure, unspecified: Secondary | ICD-10-CM | POA: Diagnosis not present

## 2020-09-26 DIAGNOSIS — Z955 Presence of coronary angioplasty implant and graft: Secondary | ICD-10-CM

## 2020-09-26 DIAGNOSIS — I083 Combined rheumatic disorders of mitral, aortic and tricuspid valves: Secondary | ICD-10-CM | POA: Diagnosis present

## 2020-09-26 DIAGNOSIS — I2510 Atherosclerotic heart disease of native coronary artery without angina pectoris: Secondary | ICD-10-CM | POA: Diagnosis present

## 2020-09-26 DIAGNOSIS — Z880 Allergy status to penicillin: Secondary | ICD-10-CM

## 2020-09-26 DIAGNOSIS — R6889 Other general symptoms and signs: Secondary | ICD-10-CM | POA: Diagnosis not present

## 2020-09-26 DIAGNOSIS — F0390 Unspecified dementia without behavioral disturbance: Secondary | ICD-10-CM | POA: Diagnosis present

## 2020-09-26 DIAGNOSIS — I517 Cardiomegaly: Secondary | ICD-10-CM | POA: Diagnosis not present

## 2020-09-26 DIAGNOSIS — I050 Rheumatic mitral stenosis: Secondary | ICD-10-CM

## 2020-09-26 DIAGNOSIS — R110 Nausea: Secondary | ICD-10-CM | POA: Diagnosis not present

## 2020-09-26 DIAGNOSIS — E785 Hyperlipidemia, unspecified: Secondary | ICD-10-CM | POA: Diagnosis present

## 2020-09-26 DIAGNOSIS — Z66 Do not resuscitate: Secondary | ICD-10-CM | POA: Diagnosis present

## 2020-09-26 DIAGNOSIS — I255 Ischemic cardiomyopathy: Secondary | ICD-10-CM | POA: Diagnosis present

## 2020-09-26 DIAGNOSIS — Z20822 Contact with and (suspected) exposure to covid-19: Secondary | ICD-10-CM | POA: Diagnosis present

## 2020-09-26 DIAGNOSIS — I953 Hypotension of hemodialysis: Secondary | ICD-10-CM | POA: Diagnosis present

## 2020-09-26 LAB — CBC WITH DIFFERENTIAL/PLATELET
Abs Immature Granulocytes: 0.08 10*3/uL — ABNORMAL HIGH (ref 0.00–0.07)
Basophils Absolute: 0.1 10*3/uL (ref 0.0–0.1)
Basophils Relative: 1 %
Eosinophils Absolute: 0.1 10*3/uL (ref 0.0–0.5)
Eosinophils Relative: 1 %
HCT: 37.5 % — ABNORMAL LOW (ref 39.0–52.0)
Hemoglobin: 11.7 g/dL — ABNORMAL LOW (ref 13.0–17.0)
Immature Granulocytes: 1 %
Lymphocytes Relative: 7 %
Lymphs Abs: 0.9 10*3/uL (ref 0.7–4.0)
MCH: 31.4 pg (ref 26.0–34.0)
MCHC: 31.2 g/dL (ref 30.0–36.0)
MCV: 100.5 fL — ABNORMAL HIGH (ref 80.0–100.0)
Monocytes Absolute: 0.8 10*3/uL (ref 0.1–1.0)
Monocytes Relative: 7 %
Neutro Abs: 10 10*3/uL — ABNORMAL HIGH (ref 1.7–7.7)
Neutrophils Relative %: 83 %
Platelets: 229 10*3/uL (ref 150–400)
RBC: 3.73 MIL/uL — ABNORMAL LOW (ref 4.22–5.81)
RDW: 15.2 % (ref 11.5–15.5)
WBC: 11.9 10*3/uL — ABNORMAL HIGH (ref 4.0–10.5)
nRBC: 0 % (ref 0.0–0.2)

## 2020-09-26 LAB — COMPREHENSIVE METABOLIC PANEL
ALT: 18 U/L (ref 0–44)
AST: 26 U/L (ref 15–41)
Albumin: 3 g/dL — ABNORMAL LOW (ref 3.5–5.0)
Alkaline Phosphatase: 111 U/L (ref 38–126)
Anion gap: 12 (ref 5–15)
BUN: 30 mg/dL — ABNORMAL HIGH (ref 8–23)
CO2: 30 mmol/L (ref 22–32)
Calcium: 9 mg/dL (ref 8.9–10.3)
Chloride: 100 mmol/L (ref 98–111)
Creatinine, Ser: 8.43 mg/dL — ABNORMAL HIGH (ref 0.61–1.24)
GFR, Estimated: 6 mL/min — ABNORMAL LOW (ref >60)
Glucose, Bld: 100 mg/dL — ABNORMAL HIGH (ref 70–99)
Potassium: 3.9 mmol/L (ref 3.5–5.1)
Sodium: 142 mmol/L (ref 135–145)
Total Bilirubin: 0.7 mg/dL (ref 0.3–1.2)
Total Protein: 6.6 g/dL (ref 6.5–8.1)

## 2020-09-26 LAB — TROPONIN I (HIGH SENSITIVITY)
Troponin I (High Sensitivity): 61 ng/L — ABNORMAL HIGH (ref <18)
Troponin I (High Sensitivity): 60 ng/L — ABNORMAL HIGH (ref <18)

## 2020-09-26 LAB — CBG MONITORING, ED: Glucose-Capillary: 123 mg/dL — ABNORMAL HIGH (ref 70–99)

## 2020-09-26 LAB — BRAIN NATRIURETIC PEPTIDE: B Natriuretic Peptide: >4500 pg/mL — ABNORMAL HIGH (ref 0.0–100.0)

## 2020-09-26 IMAGING — CT CT ANGIO CHEST
3 of 7 series · 19 of 36 positions shown · IV contrast (APPLIED)
Comparison: [DATE]

CLINICAL DATA: Shortness of breath

EXAM:
CT ANGIOGRAPHY CHEST WITH CONTRAST
TECHNIQUE: Multidetector CT imaging of the chest was performed using the
standard protocol during bolus administration of intravenous
contrast. Multiplanar CT image reconstructions and MIPs were
obtained to evaluate the vascular anatomy.
CONTRAST:  50mL OMNIPAQUE IOHEXOL 350 MG/ML SOLN

[Series 8: thins · axial · 0.76mm/px · z∈[+958,+1202]mm · 14 of 402 slices shown]
[im 27/402  lung]
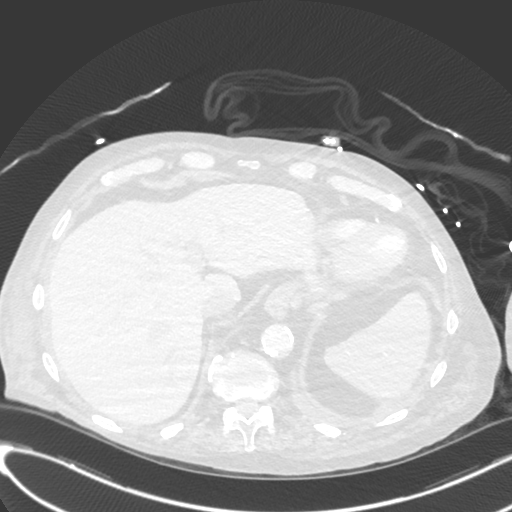
[im 54/402  mediastinal]
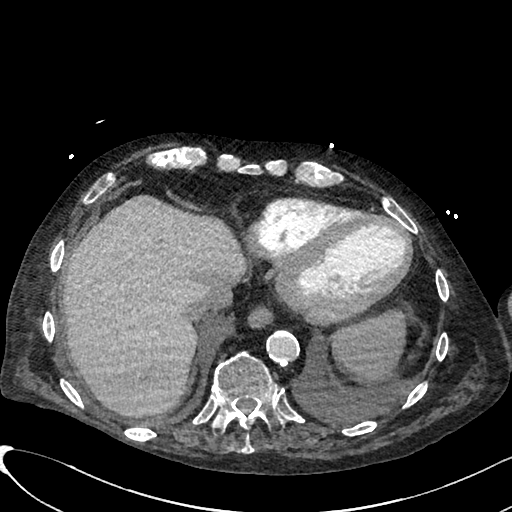
[im 81/402  lung]
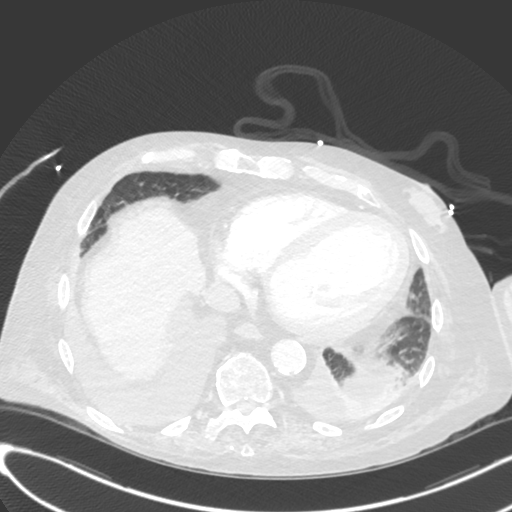
[im 107/402  mediastinal]
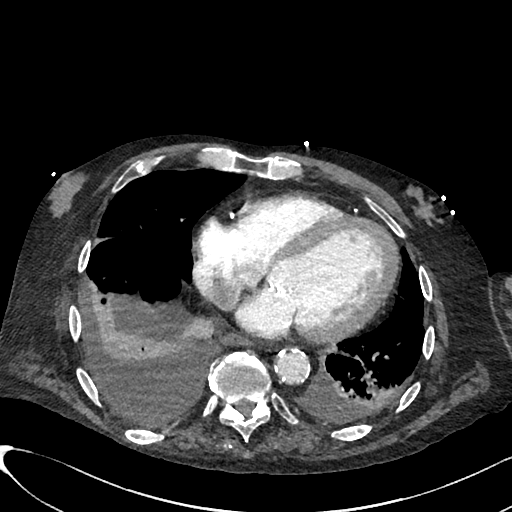
[im 134/402  lung]
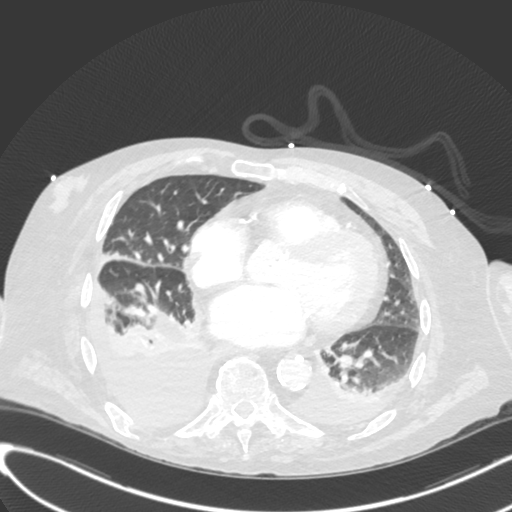
[im 161/402  mediastinal]
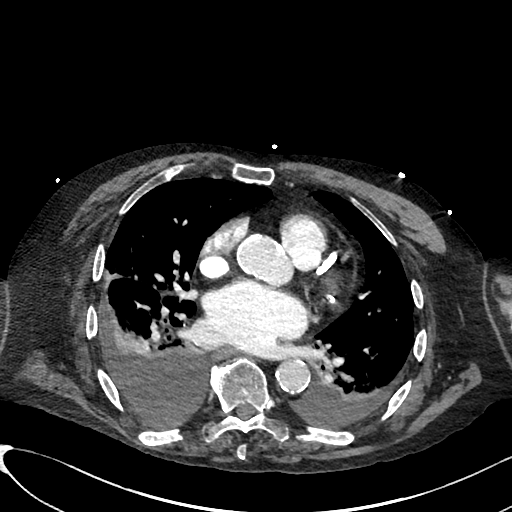
[im 188/402  lung]
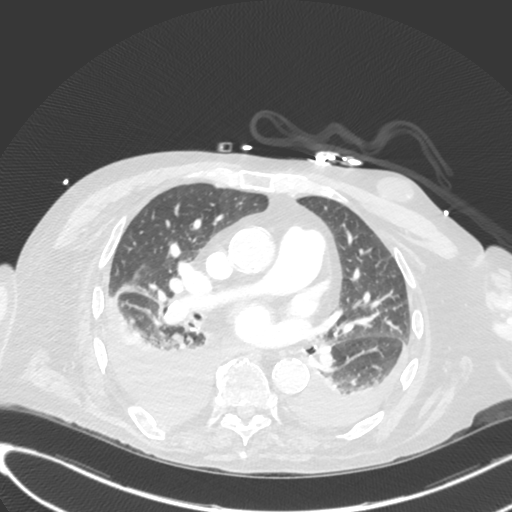
[im 214/402  mediastinal]
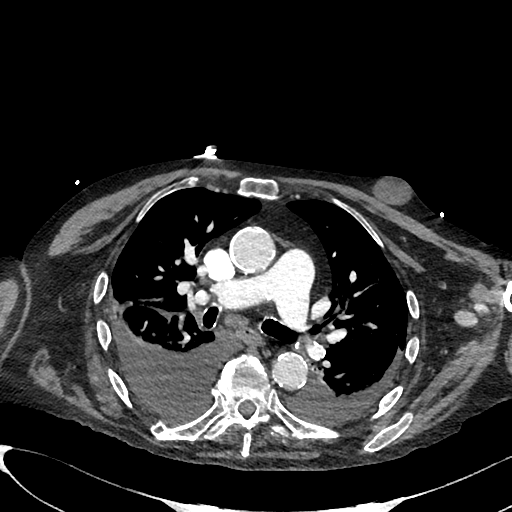
[im 241/402  lung]
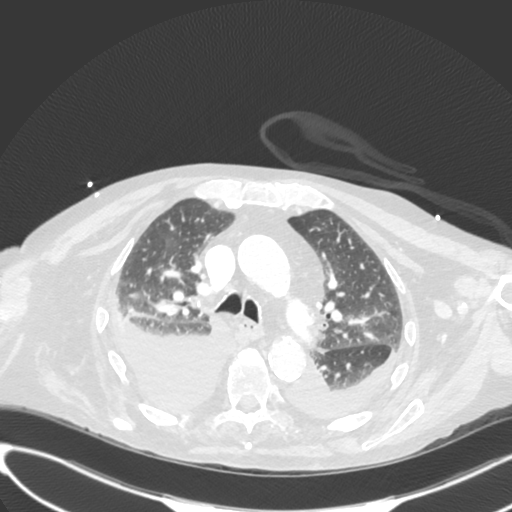
[im 268/402  mediastinal]
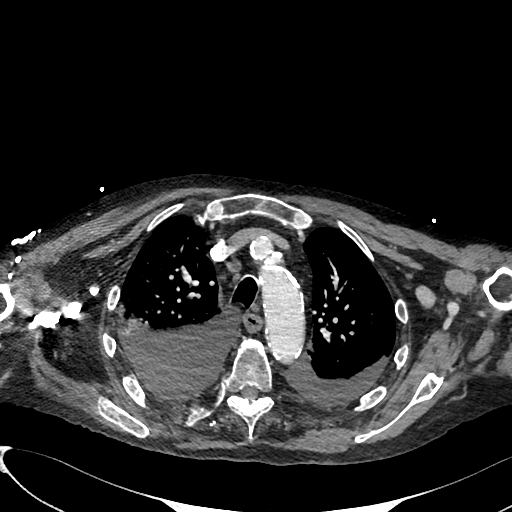
[im 295/402  lung]
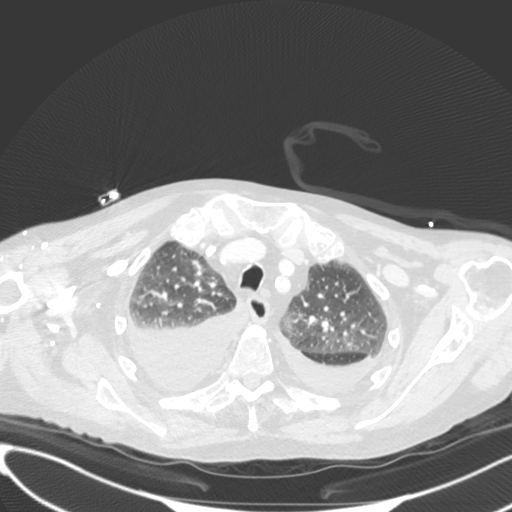
[im 321/402  mediastinal]
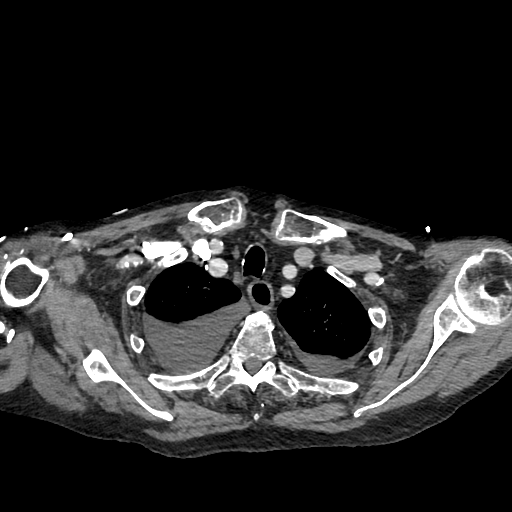
[im 348/402  lung]
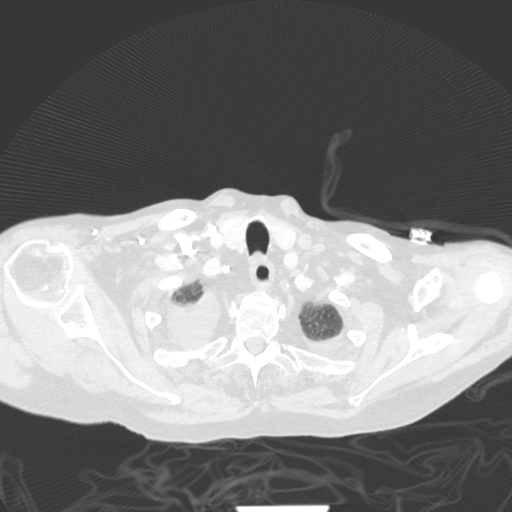
[im 375/402  mediastinal]
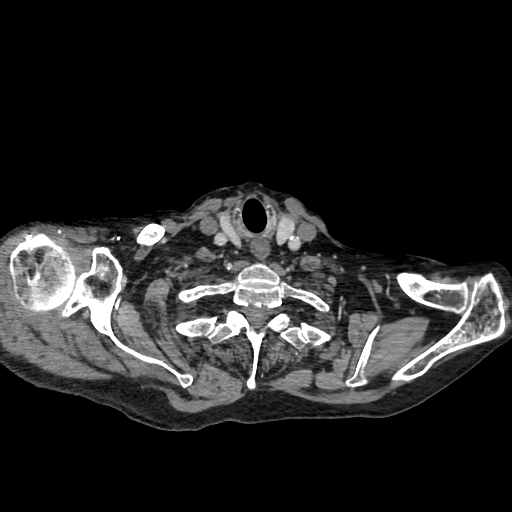

[Series 9: lung · axial · 0.76mm/px · z∈[+997,+1165]mm · 4 of 141 slices shown]
[im 29/141  mediastinal]
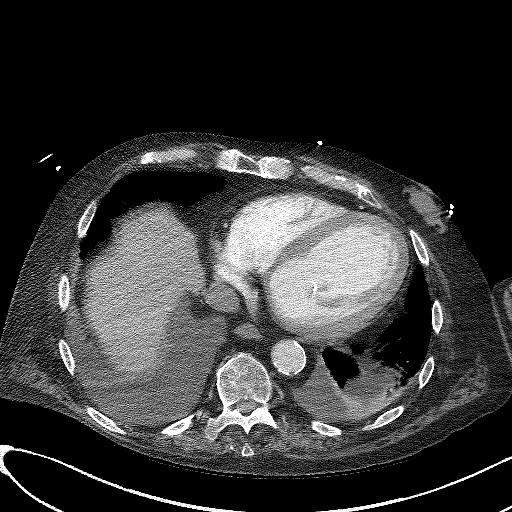
[im 57/141  mediastinal]
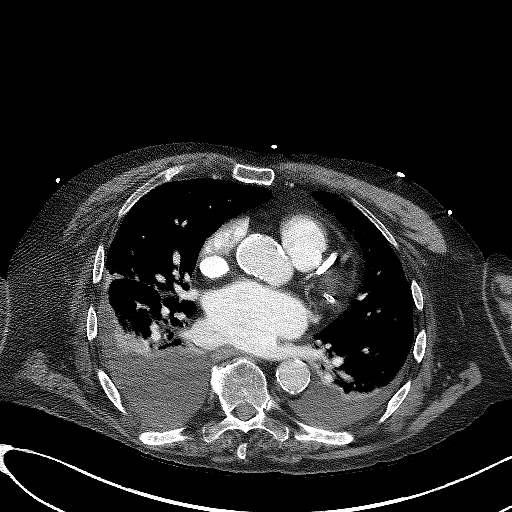
[im 85/141  mediastinal]
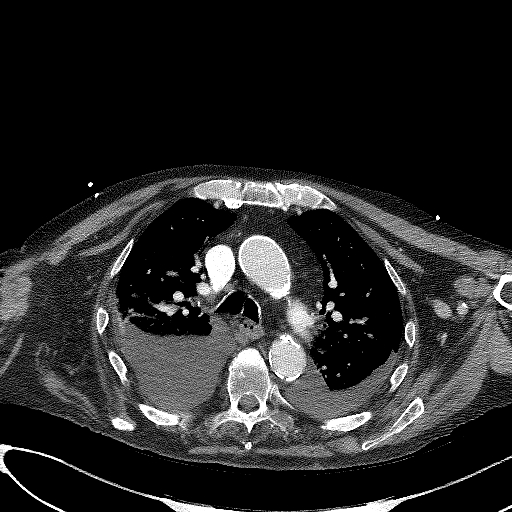
[im 113/141  mediastinal]
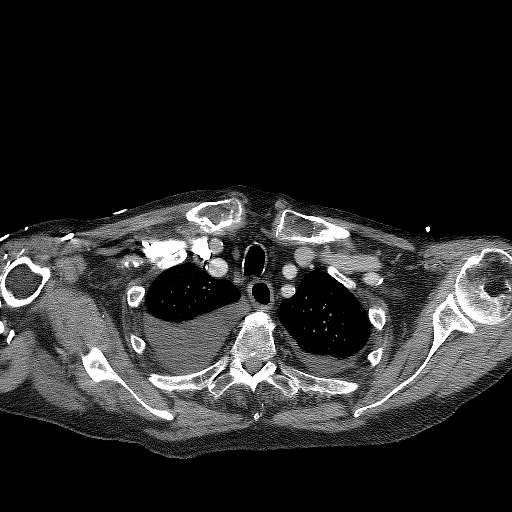

[Series 10: cor · coronal · 0.67mm/px · 1 of 126 slices shown]
[im 63/126  mediastinal]
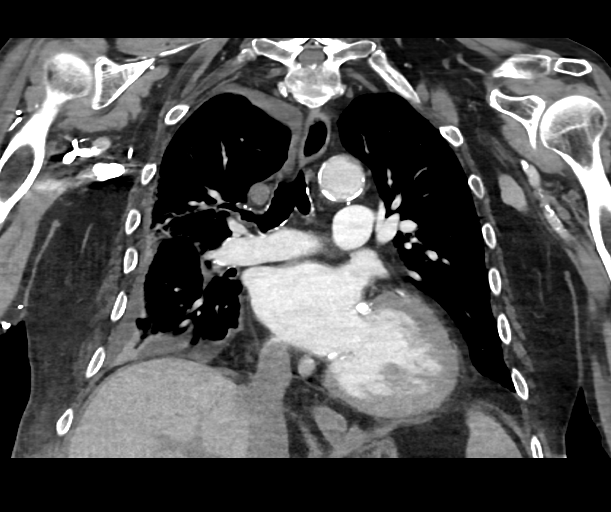

[19 of 36 positions shown; findings below may reference images not displayed]

FINDINGS: Cardiovascular: Heavily calcified aorta and coronary arteries. No
aneurysm. No filling defects in the pulmonary arteries to suggest
pulmonary emboli. Cardiomegaly.

Mediastinum/Nodes: No mediastinal, hilar, or axillary adenopathy.
Trachea and esophagus are unremarkable. Thyroid unremarkable.

Lungs/Pleura: Moderate bilateral pleural effusions. Compressive
atelectasis in the lower lobes. Vascular congestion.

Upper Abdomen: Imaging into the upper abdomen demonstrates no acute
findings.

Musculoskeletal: Bilateral gynecomastia. Well-circumscribed rounded
lesion noted along the left anterior skin surface measures 3.3 cm,
likely sebaceous cyst. No acute bony abnormality. Multiple old right
posterior rib fractures.

Review of the MIP images confirms the above findings.
IMPRESSION: No evidence of pulmonary embolus.

Cardiomegaly, diffuse coronary artery disease.

Moderate bilateral pleural effusions. Compressive atelectasis in the
lower lobes.

Vascular congestion.

Bilateral gynecomastia.

Aortic Atherosclerosis ([VZ]-[VZ]).

## 2020-09-26 IMAGING — DX DG CHEST 1V PORT
1 series · 1 of 1 positions shown · non-contrast
Comparison: [DATE]

CLINICAL DATA: Shortness of breath

EXAM:
PORTABLE CHEST 1 VIEW

[chest ap]
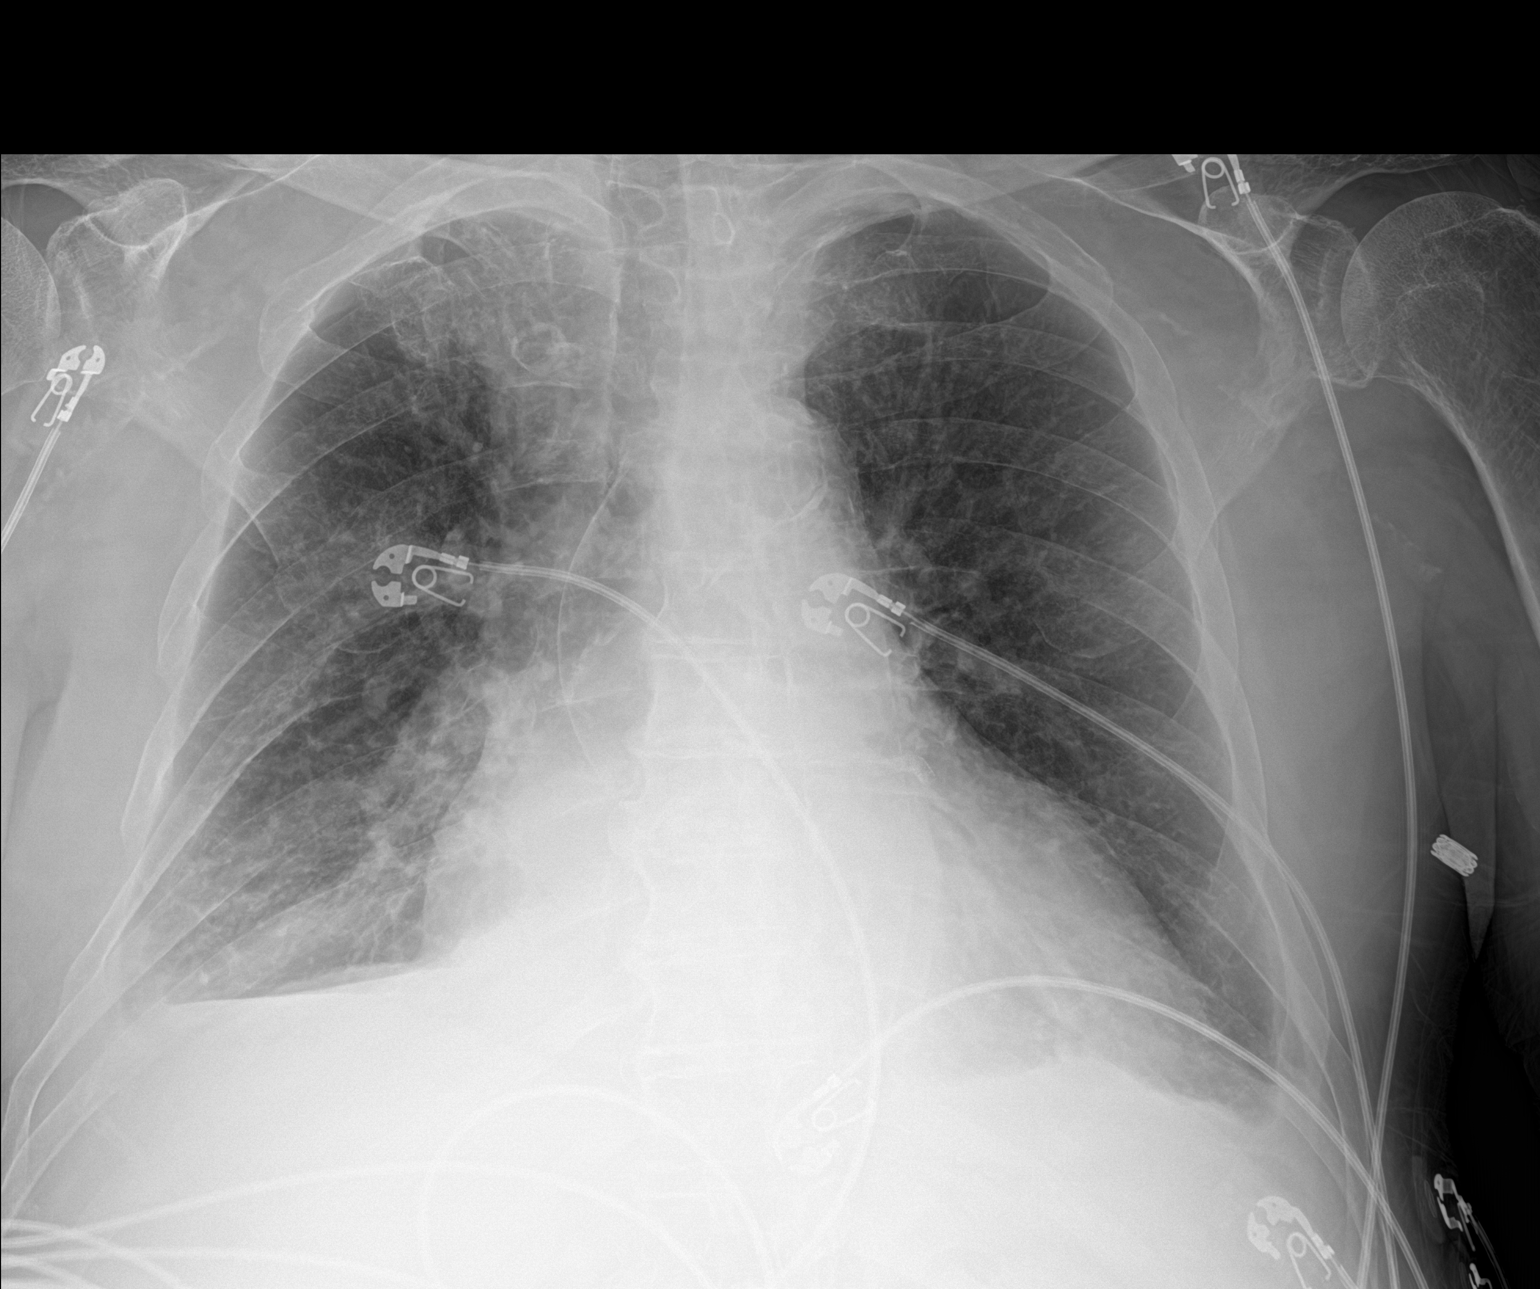

[1 of 1 positions shown; findings below may reference images not displayed]

FINDINGS: Bilateral interstitial thickening. Trace bilateral pleural
effusions. No focal consolidation. No pleural effusion or
pneumothorax. Stable cardiomegaly. Thoracic aortic atherosclerosis.

No acute osseous abnormality.
IMPRESSION: 1. Mild CHF.

## 2020-09-26 IMAGING — CT CT HEAD W/O CM
4 series · 17 of 47 positions shown, 19 images · non-contrast
Comparison: [DATE]

CLINICAL DATA: Change in mental status

EXAM:
CT HEAD WITHOUT CONTRAST
TECHNIQUE: Contiguous axial images were obtained from the base of the skull
through the vertex without intravenous contrast.

[Series 3: head bone · axial · 0.45mm/px · z∈[+1322,+1376]mm · 4 of 78 slices shown]
[im 8/78  bone]
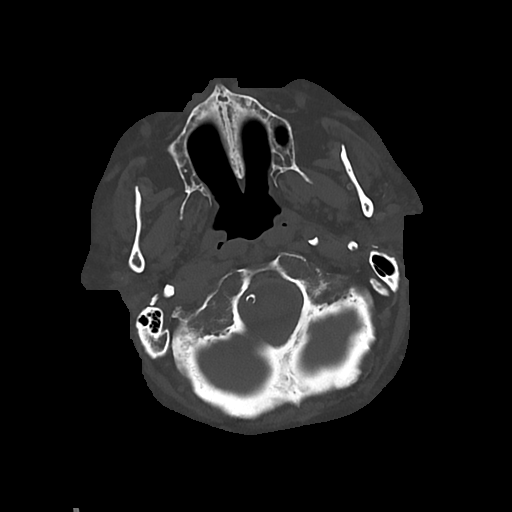
[im 16/78  bone]
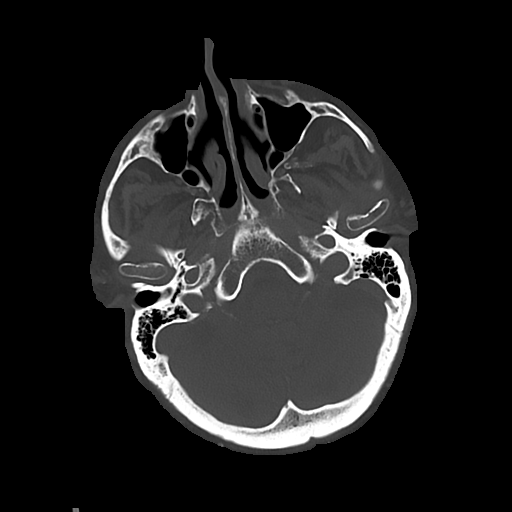
[im 24/78  bone]
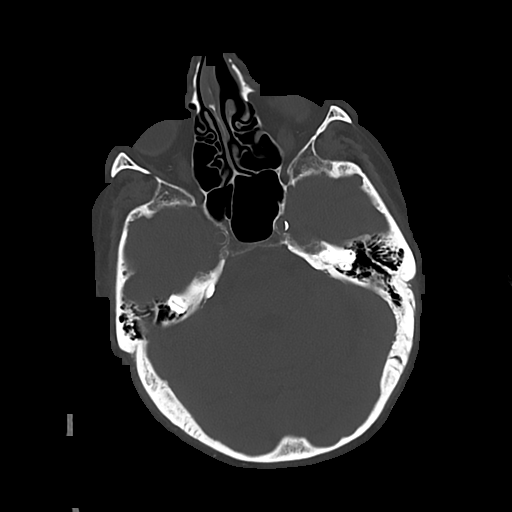
[im 35/78  bone]
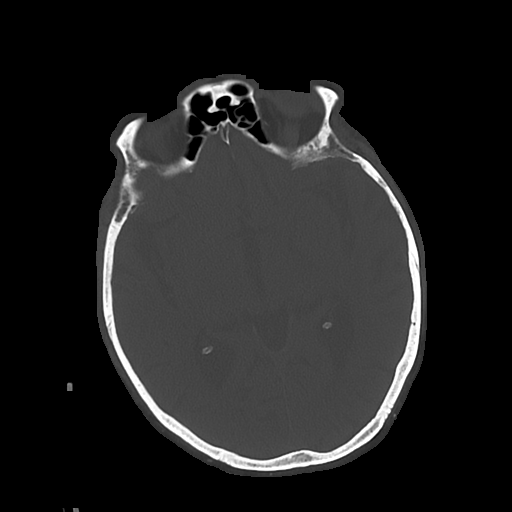

[Series 4: head wo · axial · 0.45mm/px · z∈[+1324,+1444]mm · 7 of 32 slices shown, 9 images]
[im 4/32  brain]
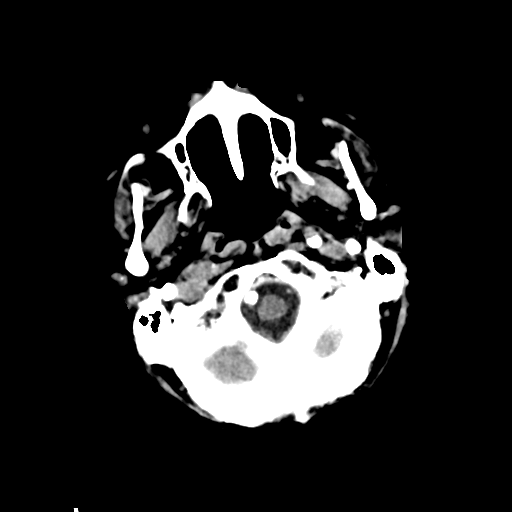
[im 4/32  bone]
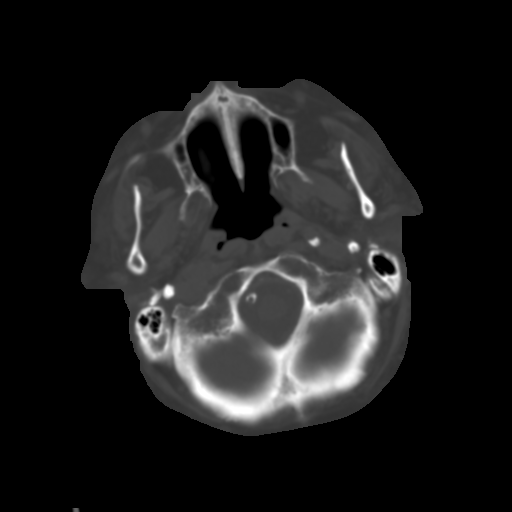
[im 8/32  brain]
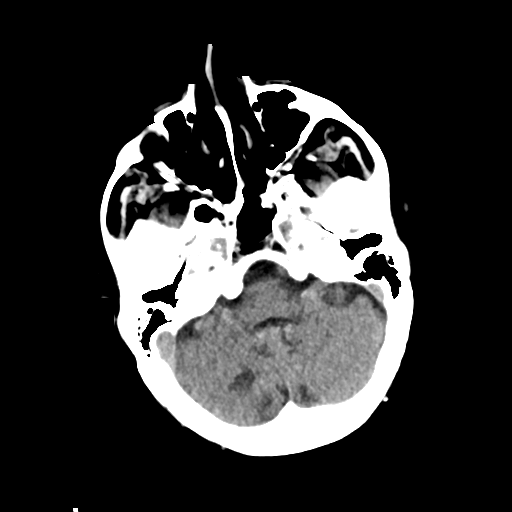
[im 12/32  brain]
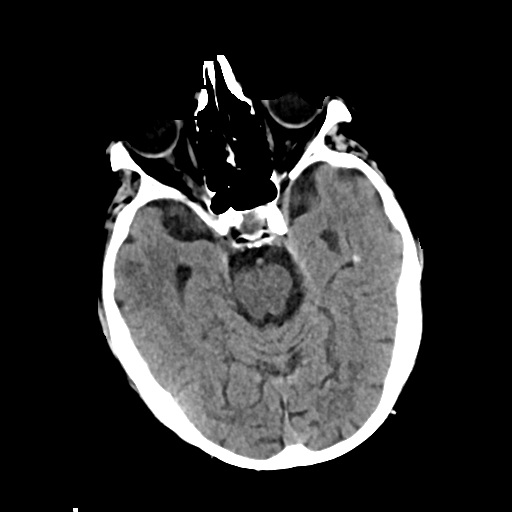
[im 16/32  brain]
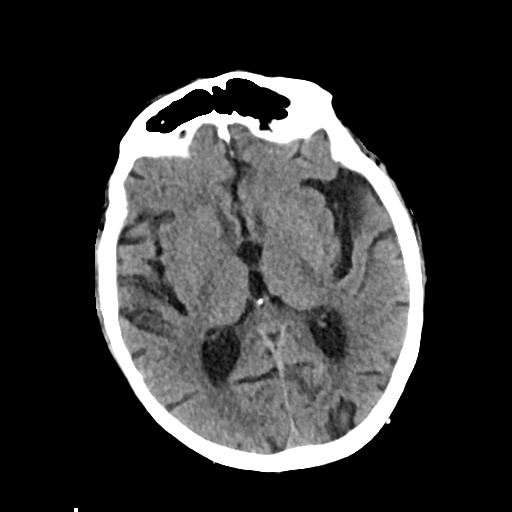
[im 20/32  brain]
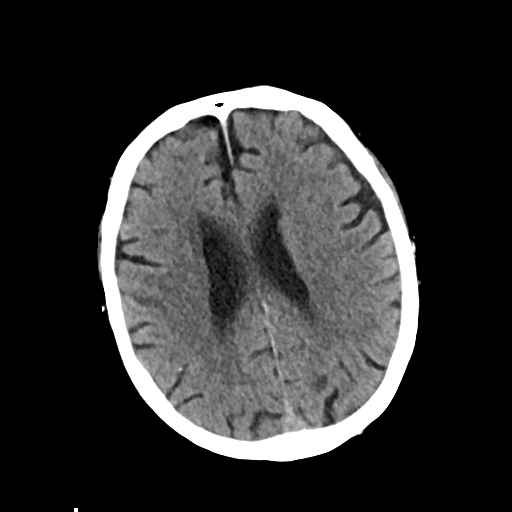
[im 20/32  bone]
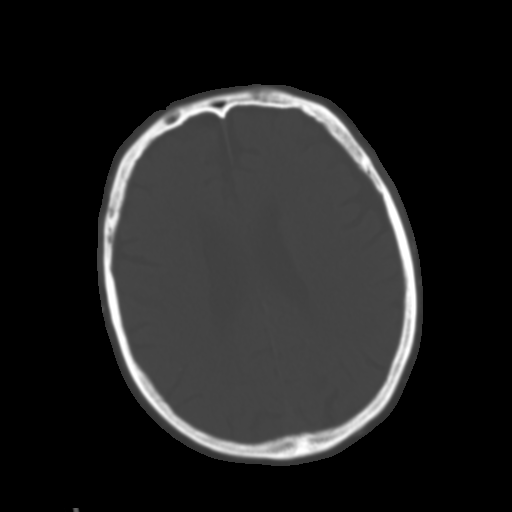
[im 24/32  brain]
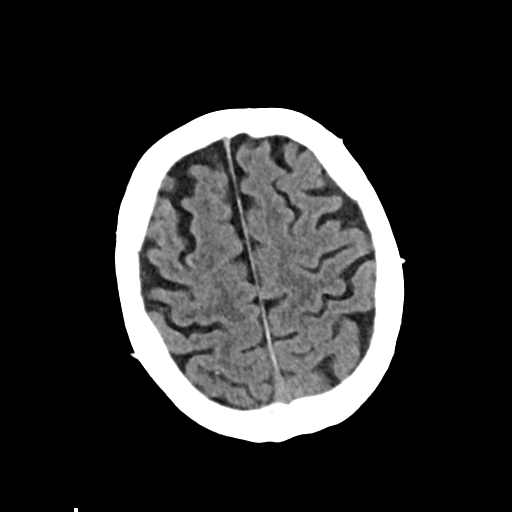
[im 28/32  brain]
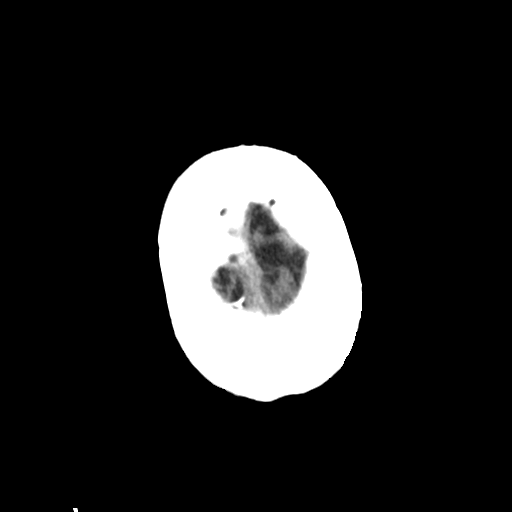

[Series 5: cor soft · coronal · 0.35mm/px · 3 of 71 slices shown]
[im 24/71  brain]
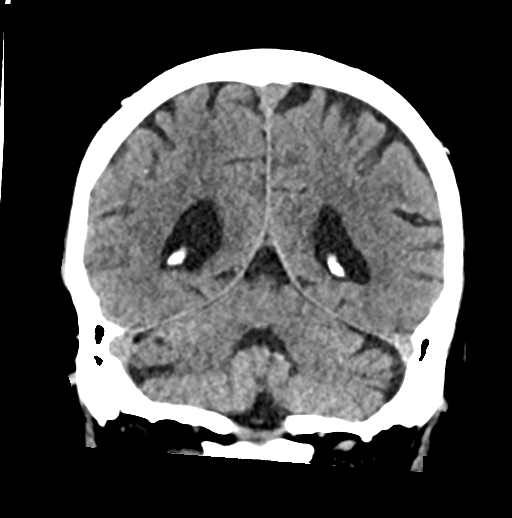
[im 32/71  brain]
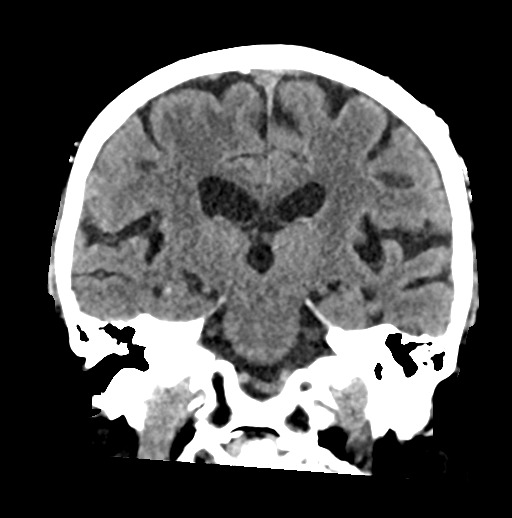
[im 39/71  brain]
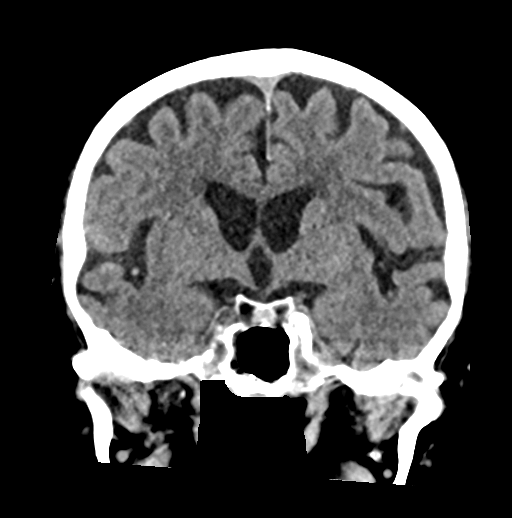

[Series 6: sag soft · sagittal · 0.33mm/px · 3 of 55 slices shown]
[im 19/55  brain]
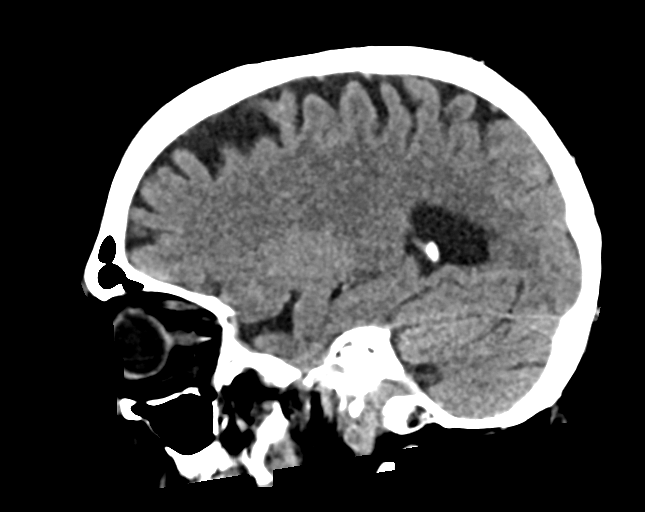
[im 28/55  brain]
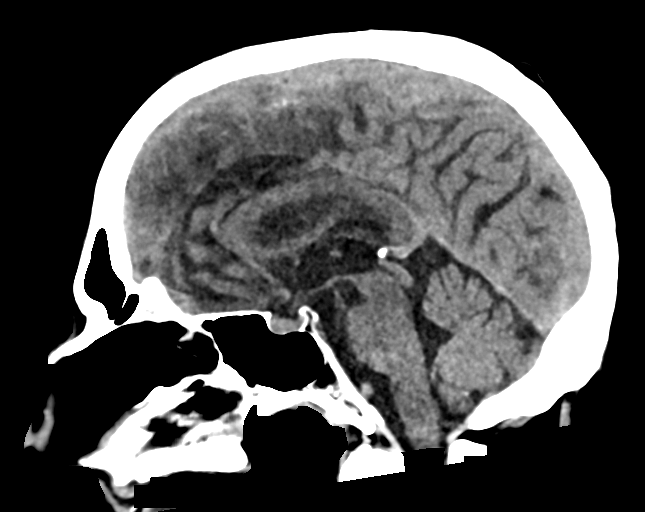
[im 37/55  brain]
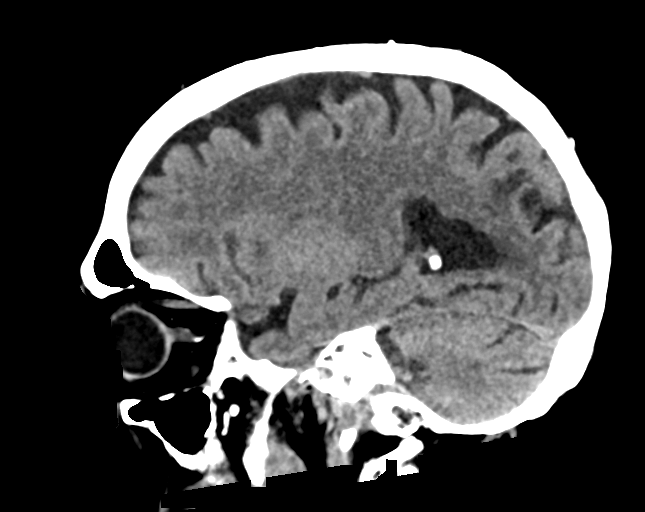

[17 of 47 positions shown; findings below may reference images not displayed]

FINDINGS: Brain: No evidence of acute territorial infarction, hemorrhage,
hydrocephalus,extra-axial collection or mass lesion/mass effect.
There is dilatation the ventricles and sulci consistent with
age-related atrophy. Low-attenuation changes in the deep white
matter consistent with small vessel ischemia.

Vascular: No hyperdense vessel or unexpected calcification.

Skull: The skull is intact. No fracture or focal lesion identified.

Sinuses/Orbits: The visualized paranasal sinuses and mastoid air
cells are clear. The orbits and globes intact.

Other: None
IMPRESSION: No acute intracranial abnormality.

Findings consistent with age related atrophy and chronic small
vessel ischemia

## 2020-09-26 MED ORDER — SUCROFERRIC OXYHYDROXIDE 500 MG PO CHEW
500.0000 mg | CHEWABLE_TABLET | Freq: Three times a day (TID) | ORAL | Status: DC
  Administered 2020-09-27 – 2020-09-29 (×3): 500 mg via ORAL
  Filled 2020-09-26 (×9): qty 1

## 2020-09-26 MED ORDER — NEPRO/CARBSTEADY PO LIQD
237.0000 mL | ORAL | Status: DC
  Administered 2020-09-28: 237 mL via ORAL
  Filled 2020-09-26: qty 237

## 2020-09-26 MED ORDER — ACETAMINOPHEN 325 MG PO TABS
650.0000 mg | ORAL_TABLET | ORAL | Status: DC | PRN
  Administered 2020-09-28: 650 mg via ORAL
  Filled 2020-09-26: qty 2

## 2020-09-26 MED ORDER — ADULT MULTIVITAMIN W/MINERALS CH
1.0000 | ORAL_TABLET | Freq: Every day | ORAL | Status: DC
  Administered 2020-09-27 – 2020-09-29 (×3): 1 via ORAL
  Filled 2020-09-26 (×3): qty 1

## 2020-09-26 MED ORDER — SERTRALINE HCL 100 MG PO TABS
100.0000 mg | ORAL_TABLET | Freq: Every morning | ORAL | Status: DC
  Administered 2020-09-27 – 2020-09-29 (×3): 100 mg via ORAL
  Filled 2020-09-26 (×5): qty 1

## 2020-09-26 MED ORDER — SODIUM CHLORIDE 0.9% FLUSH
3.0000 mL | Freq: Two times a day (BID) | INTRAVENOUS | Status: DC
  Administered 2020-09-26 – 2020-09-29 (×6): 3 mL via INTRAVENOUS

## 2020-09-26 MED ORDER — IOHEXOL 350 MG/ML SOLN
50.0000 mL | Freq: Once | INTRAVENOUS | Status: AC | PRN
  Administered 2020-09-26: 50 mL via INTRAVENOUS

## 2020-09-26 MED ORDER — MELATONIN 5 MG PO TABS
10.0000 mg | ORAL_TABLET | Freq: Every day | ORAL | Status: DC
  Administered 2020-09-26 – 2020-09-28 (×3): 10 mg via ORAL
  Filled 2020-09-26 (×4): qty 2

## 2020-09-26 MED ORDER — ONDANSETRON HCL 4 MG/2ML IJ SOLN
4.0000 mg | Freq: Four times a day (QID) | INTRAMUSCULAR | Status: DC | PRN
  Administered 2020-09-28: 4 mg via INTRAVENOUS
  Filled 2020-09-26: qty 2

## 2020-09-26 MED ORDER — LEVOTHYROXINE SODIUM 75 MCG PO TABS
75.0000 ug | ORAL_TABLET | Freq: Every day | ORAL | Status: DC
  Administered 2020-09-27 – 2020-09-29 (×3): 75 ug via ORAL
  Filled 2020-09-26 (×3): qty 1

## 2020-09-26 MED ORDER — HYDRALAZINE HCL 20 MG/ML IJ SOLN: 20.0000 mg | INTRAMUSCULAR | Status: DC | PRN

## 2020-09-26 MED ORDER — VITAMIN D 25 MCG (1000 UNIT) PO TABS
5000.0000 [IU] | ORAL_TABLET | Freq: Every day | ORAL | Status: DC
  Administered 2020-09-27 – 2020-09-29 (×3): 5000 [IU] via ORAL
  Filled 2020-09-26 (×4): qty 5

## 2020-09-26 MED ORDER — CLOPIDOGREL BISULFATE 75 MG PO TABS
75.0000 mg | ORAL_TABLET | Freq: Every morning | ORAL | Status: DC
  Administered 2020-09-27 – 2020-09-29 (×3): 75 mg via ORAL
  Filled 2020-09-26 (×3): qty 1

## 2020-09-26 MED ORDER — HEPARIN SODIUM (PORCINE) 5000 UNIT/ML IJ SOLN
5000.0000 [IU] | Freq: Three times a day (TID) | INTRAMUSCULAR | Status: DC
  Administered 2020-09-26 – 2020-09-29 (×6): 5000 [IU] via SUBCUTANEOUS
  Filled 2020-09-26 (×6): qty 1

## 2020-09-26 MED ORDER — FISH OIL 1000 MG PO CAPS: 1000.0000 mg | ORAL_CAPSULE | Freq: Every day | ORAL | Status: DC

## 2020-09-26 MED ORDER — QUETIAPINE FUMARATE 25 MG PO TABS
25.0000 mg | ORAL_TABLET | Freq: Every day | ORAL | Status: DC
  Administered 2020-09-26 – 2020-09-28 (×3): 25 mg via ORAL
  Filled 2020-09-26 (×4): qty 1

## 2020-09-26 MED ORDER — ISOSORBIDE MONONITRATE ER 30 MG PO TB24
30.0000 mg | ORAL_TABLET | Freq: Every day | ORAL | Status: DC
  Administered 2020-09-26: 30 mg via ORAL
  Filled 2020-09-26: qty 1

## 2020-09-26 MED ORDER — ATORVASTATIN CALCIUM 40 MG PO TABS
40.0000 mg | ORAL_TABLET | Freq: Every evening | ORAL | Status: DC
  Administered 2020-09-27 – 2020-09-28 (×2): 40 mg via ORAL
  Filled 2020-09-26 (×3): qty 1

## 2020-09-26 MED ORDER — SODIUM CHLORIDE 0.9% FLUSH: 3.0000 mL | INTRAVENOUS | Status: DC | PRN

## 2020-09-26 MED ORDER — POLYETHYLENE GLYCOL 3350 17 G PO PACK: 17.0000 g | PACK | Freq: Every day | ORAL | Status: DC | PRN

## 2020-09-26 MED ORDER — ALLOPURINOL 100 MG PO TABS
100.0000 mg | ORAL_TABLET | Freq: Every day | ORAL | Status: DC
  Administered 2020-09-27 – 2020-09-29 (×3): 100 mg via ORAL
  Filled 2020-09-26 (×4): qty 1

## 2020-09-26 MED ORDER — AMIODARONE HCL 200 MG PO TABS
200.0000 mg | ORAL_TABLET | Freq: Every morning | ORAL | Status: DC
  Administered 2020-09-27 – 2020-09-29 (×3): 200 mg via ORAL
  Filled 2020-09-26 (×3): qty 1

## 2020-09-26 MED ORDER — ASPIRIN EC 81 MG PO TBEC
81.0000 mg | DELAYED_RELEASE_TABLET | Freq: Every day | ORAL | Status: DC
  Administered 2020-09-27 – 2020-09-29 (×3): 81 mg via ORAL
  Filled 2020-09-26 (×2): qty 1

## 2020-09-26 MED ORDER — ZINC SULFATE 220 (50 ZN) MG PO CAPS
220.0000 mg | ORAL_CAPSULE | Freq: Every morning | ORAL | Status: DC
  Administered 2020-09-27 – 2020-09-29 (×3): 220 mg via ORAL
  Filled 2020-09-26 (×3): qty 1

## 2020-09-26 MED ORDER — LIDOCAINE-PRILOCAINE 2.5-2.5 % EX CREA
TOPICAL_CREAM | Freq: Every day | CUTANEOUS | Status: DC | PRN
  Filled 2020-09-26: qty 5

## 2020-09-26 MED ORDER — ACETAMINOPHEN 500 MG PO TABS: 500.0000 mg | ORAL_TABLET | Freq: Four times a day (QID) | ORAL | Status: DC | PRN

## 2020-09-26 MED ORDER — SODIUM CHLORIDE 0.9 % IV SOLN: 250.0000 mL | INTRAVENOUS | Status: DC | PRN
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
## 2020-09-26 NOTE — ED Notes (Signed)
Pt removed monitoring leads. Repositioned and replaced. Mild confusion noted, but able to be reoriented easily. Calm and cooperative.

## 2020-09-26 NOTE — ED Notes (Signed)
Pt daughter updated

## 2020-09-26 NOTE — ED Triage Notes (Signed)
Pt bib ems from home with shob onset 3 days ago. Pt is a Monday, Wednesday, Friday dialysis pt. Last dialyzed on Friday. Pt states he thinks this is all allergy related. Pt in NAD, talking in complete sentences. 178/70 HR 70 94% RA CBG 130 98.1

## 2020-09-26 NOTE — ED Provider Notes (Signed)
Signout note  80 year old male with history of end-stage renal disease on dialysis presenting to ER with concern for shortness of breath.  Initial labs are stable except mildly elevated troponin, CXR with mild CHF.  CTA ordered to rule out PE.  Follow-up CT PE study.  If negative, may need admission for exertional dyspnea.  7:41 PM reassessed patient, reviewed results with patient and daughter at bedside.  Patient appears well and has no current symptoms.  Given the exertional dyspnea, patient's extensive coronary history, aortic stenosis history, discussed case with Dr. Orene Desanctis with cardiology - he recommends hospitalist admission, echocardiogram; will consult hospitalist for admission, will consult nephrology to get patient added to their list for dialysis tomorrow   Lucrezia Starch, MD 09/26/20 1942
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
## 2020-09-27 ENCOUNTER — Observation Stay (HOSPITAL_COMMUNITY): Payer: No Typology Code available for payment source

## 2020-09-27 ENCOUNTER — Other Ambulatory Visit: Payer: Self-pay

## 2020-09-27 DIAGNOSIS — J810 Acute pulmonary edema: Secondary | ICD-10-CM | POA: Diagnosis not present

## 2020-09-27 DIAGNOSIS — Z882 Allergy status to sulfonamides status: Secondary | ICD-10-CM | POA: Diagnosis not present

## 2020-09-27 DIAGNOSIS — I10 Essential (primary) hypertension: Secondary | ICD-10-CM | POA: Diagnosis not present

## 2020-09-27 DIAGNOSIS — I5023 Acute on chronic systolic (congestive) heart failure: Secondary | ICD-10-CM

## 2020-09-27 DIAGNOSIS — I5043 Acute on chronic combined systolic (congestive) and diastolic (congestive) heart failure: Secondary | ICD-10-CM | POA: Diagnosis present

## 2020-09-27 DIAGNOSIS — R5381 Other malaise: Secondary | ICD-10-CM | POA: Diagnosis present

## 2020-09-27 DIAGNOSIS — I480 Paroxysmal atrial fibrillation: Secondary | ICD-10-CM | POA: Diagnosis present

## 2020-09-27 DIAGNOSIS — Z66 Do not resuscitate: Secondary | ICD-10-CM | POA: Diagnosis present

## 2020-09-27 DIAGNOSIS — N186 End stage renal disease: Secondary | ICD-10-CM

## 2020-09-27 DIAGNOSIS — Z880 Allergy status to penicillin: Secondary | ICD-10-CM | POA: Diagnosis not present

## 2020-09-27 DIAGNOSIS — R0600 Dyspnea, unspecified: Secondary | ICD-10-CM | POA: Diagnosis present

## 2020-09-27 DIAGNOSIS — K219 Gastro-esophageal reflux disease without esophagitis: Secondary | ICD-10-CM | POA: Diagnosis present

## 2020-09-27 DIAGNOSIS — D631 Anemia in chronic kidney disease: Secondary | ICD-10-CM | POA: Diagnosis not present

## 2020-09-27 DIAGNOSIS — I255 Ischemic cardiomyopathy: Secondary | ICD-10-CM | POA: Diagnosis present

## 2020-09-27 DIAGNOSIS — E785 Hyperlipidemia, unspecified: Secondary | ICD-10-CM | POA: Diagnosis present

## 2020-09-27 DIAGNOSIS — Z992 Dependence on renal dialysis: Secondary | ICD-10-CM | POA: Diagnosis not present

## 2020-09-27 DIAGNOSIS — I132 Hypertensive heart and chronic kidney disease with heart failure and with stage 5 chronic kidney disease, or end stage renal disease: Secondary | ICD-10-CM | POA: Diagnosis not present

## 2020-09-27 DIAGNOSIS — Z20822 Contact with and (suspected) exposure to covid-19: Secondary | ICD-10-CM | POA: Diagnosis present

## 2020-09-27 DIAGNOSIS — R0602 Shortness of breath: Secondary | ICD-10-CM | POA: Diagnosis not present

## 2020-09-27 DIAGNOSIS — E8779 Other fluid overload: Secondary | ICD-10-CM | POA: Diagnosis not present

## 2020-09-27 DIAGNOSIS — I350 Nonrheumatic aortic (valve) stenosis: Secondary | ICD-10-CM

## 2020-09-27 DIAGNOSIS — I953 Hypotension of hemodialysis: Secondary | ICD-10-CM | POA: Diagnosis present

## 2020-09-27 DIAGNOSIS — I2510 Atherosclerotic heart disease of native coronary artery without angina pectoris: Secondary | ICD-10-CM | POA: Diagnosis present

## 2020-09-27 DIAGNOSIS — E039 Hypothyroidism, unspecified: Secondary | ICD-10-CM | POA: Diagnosis present

## 2020-09-27 DIAGNOSIS — Z888 Allergy status to other drugs, medicaments and biological substances status: Secondary | ICD-10-CM | POA: Diagnosis not present

## 2020-09-27 DIAGNOSIS — M109 Gout, unspecified: Secondary | ICD-10-CM | POA: Diagnosis present

## 2020-09-27 DIAGNOSIS — I252 Old myocardial infarction: Secondary | ICD-10-CM | POA: Diagnosis not present

## 2020-09-27 DIAGNOSIS — J9811 Atelectasis: Secondary | ICD-10-CM | POA: Diagnosis present

## 2020-09-27 DIAGNOSIS — H9190 Unspecified hearing loss, unspecified ear: Secondary | ICD-10-CM | POA: Diagnosis present

## 2020-09-27 DIAGNOSIS — F32A Depression, unspecified: Secondary | ICD-10-CM | POA: Diagnosis present

## 2020-09-27 DIAGNOSIS — F0390 Unspecified dementia without behavioral disturbance: Secondary | ICD-10-CM | POA: Diagnosis present

## 2020-09-27 LAB — ECHOCARDIOGRAM COMPLETE
AR max vel: 0.74 cm2
AV Area VTI: 0.76 cm2
AV Area mean vel: 0.77 cm2
AV Mean grad: 22 mmHg
AV Peak grad: 38.2 mmHg
Ao pk vel: 3.09 m/s
Area-P 1/2: 2.55 cm2
Height: 68 in
MV M vel: 5.22 m/s
MV Peak grad: 109 mmHg
MV VTI: 1.18 cm2
P 1/2 time: 388 msec
S' Lateral: 4.8 cm
Weight: 2560 oz

## 2020-09-27 LAB — BASIC METABOLIC PANEL
Anion gap: 12 (ref 5–15)
BUN: 35 mg/dL — ABNORMAL HIGH (ref 8–23)
CO2: 31 mmol/L (ref 22–32)
Calcium: 8.8 mg/dL — ABNORMAL LOW (ref 8.9–10.3)
Chloride: 99 mmol/L (ref 98–111)
Creatinine, Ser: 9.35 mg/dL — ABNORMAL HIGH (ref 0.61–1.24)
GFR, Estimated: 5 mL/min — ABNORMAL LOW (ref >60)
Glucose, Bld: 92 mg/dL (ref 70–99)
Potassium: 4 mmol/L (ref 3.5–5.1)
Sodium: 142 mmol/L (ref 135–145)

## 2020-09-27 LAB — SARS CORONAVIRUS 2 (TAT 6-24 HRS): SARS Coronavirus 2: NEGATIVE

## 2020-09-27 MED ORDER — MIDODRINE HCL 5 MG PO TABS
5.0000 mg | ORAL_TABLET | Freq: Once | ORAL | Status: AC
  Administered 2020-09-27: 5 mg via ORAL
  Filled 2020-09-27: qty 1

## 2020-09-27 MED ORDER — MIDODRINE HCL 5 MG PO TABS
5.0000 mg | ORAL_TABLET | ORAL | Status: DC
  Administered 2020-09-28: 5 mg via ORAL
  Filled 2020-09-27: qty 1

## 2020-09-27 NOTE — ED Notes (Signed)
Pt transported to HD bay 6.

## 2020-09-27 NOTE — Progress Notes (Addendum)
PROGRESS NOTE  CASTEN FLOREN  LTJ:[id number] DOB: [birth year] DOA: 09/26/2020 PCP: Gaynelle Arabian, MD  Outpatient Specialists: [facility] Cardiology Brief Narrative: DAION GINSBERG is a 80 y.o. male with a history of ESRD, CAD, ICM, history of chronic HFrEF (LVEF up to 50-55% as of June 2021, moderate AS, and HTN who presented to the ED 3/21 instead of dialysis due to increased SOB and DOE for 3 days. In the ED troponin was mildly elevated with flat trend, BNP elevated, and pulmonary edema, vascular congestion was noted on CXR. BPs were labile, initially elevated into 190's, later BPs hypotensive after antihypertensives given. He denies any chest pain. Nephrology consulted for HD which was performed 3/22. Echocardiogram revealed worsening LVEF to 35%-40% with global hypokinesis and moderate-severe AS. Given new worsening LV systolic dysfunction and progressive CHF symptoms, cardiology is consulted.   Assessment & Plan: Principal Problem:   Acute on chronic systolic CHF (congestive heart failure) (HCC) Active Problems:   HTN (hypertension)   ESRD (end stage renal disease) (HCC)   Paroxysmal A-fib (HCC)   Ischemic cardiomyopathy   Hypertensive urgency   Aortic stenosis  Acute on chronic HFrEF: Due to ICM initially w/regional WMA's on prior echo. Current echo now with worsened LVEF to 35-40%, mod-severe AS and global hypokinesis. ?if worsening isn't due to ICM.  - Cardiology consulted, I appreciate their recommendations.  - Treat overload with HD UF. Appreciate nephrology assistance.   CAD, ICM, demand myocardial ischemia: Heavily calcified atherosclerosis on CT. Troponin elevated without chest pain and flat trend in setting of ESRD not consistent with ACS.  - Continue ASA, plavix, statin - Restart imdur when BP improves. Not on BB presumably due to bradycardia.  PAF:  - Continue amiodarone - Antiplatelets, not on AC.   ESRD:  - HD per nephrology.   HTN with current hypotension:  - Held home  meds w/hypotension this AM, restarted midodrine given need for UF w/HD.  Mod-severe AS:  - Per cardiology  Depression:  - Seroquel, zoloft  DVT prophylaxis: Heparin Code Status: Full Family Communication: None at bedside Disposition Plan:  Status is: Inpatient  Remains inpatient appropriate because:Ongoing diagnostic testing needed not appropriate for outpatient work up and Inpatient level of care appropriate due to severity of illness   Dispo: The patient is from: Home              Anticipated d/c is to: Home              Patient currently is not medically stable to d/c.   Difficult to place patient No  Consultants:   Nephrology  Cardiology  Procedures:   Echocardiogram 1. Left ventricular ejection fraction, by estimation, is 35 to 40%. Left ventricular ejection fraction by 3D volume is 40 %. The left ventricle has moderately decreased function. The left ventricle demonstrates global hypokinesis. Left ventricular diastolic function could not be evaluated. The average left ventricular global longitudinal strain is -9.1 %. The global longitudinal strain is abnormal.  2. Moderate to severe aortic stenosis is present. Vmax 3.1 m/s, MG 22 mmHG, AVA 0.76 cm2, DI 0.20. EF is moderately reduced 35-40% with SVI 36 cc/m2. Findings could represent low-flow low-gradient severe AS.  Antimicrobials:  None   Subjective: This AM feels shortness of breath at rest, worse with exertion worsening over the past week or so. No current or recent chest pain or palpitations.   Objective: Vitals:   09/27/20 1530 09/27/20 1600 09/27/20 1623 09/27/20 1627  BP: (!) 106/40 Marland Kitchen)  134/46 (!) 112/43 (!) 128/44  Pulse: (!) 59  (!) 55 (!) 58  Resp:  12 (!) 21 19  Temp:   97.7 F (36.5 C)   TempSrc:   Oral   SpO2:   98% 97%  Weight:   72 kg   Height:        Intake/Output Summary (Last 24 hours) at 09/27/2020 1628 Last data filed at 09/27/2020 1623 Gross per 24 hour  Intake --  Output 2500 ml  Net  -2500 ml   Filed Weights   09/26/20 1246 09/27/20 1211 09/27/20 1623  Weight: 72.6 kg 74.4 kg 72 kg    Gen: 80 y.o. male in no distress Pulm: Tachypneic with crackles, diminished. CV: Bradycardic, regular, +systolic murmur at RUSB. +JVD this AM with trace edema. GI: Abdomen soft, non-tender, non-distended, with normoactive bowel sounds. No organomegaly or masses felt. Ext: Warm, no deformities Skin: No rashes, lesions or ulcers. LUE AVF w/+bruit. Neuro: Alert and oriented. No focal neurological deficits. Psych: Judgement and insight appear normal. Mood & affect appropriate.   Data Reviewed: I have personally reviewed following labs and imaging studies  CBC: Recent Labs  Lab 09/26/20 1339  WBC 11.9*  NEUTROABS 10.0*  HGB 11.7*  HCT 37.5*  MCV 100.5*  PLT 938   Basic Metabolic Panel: Recent Labs  Lab 09/26/20 1339 09/27/20 0331  NA 142 142  K 3.9 4.0  CL 100 99  CO2 30 31  GLUCOSE 100* 92  BUN 30* 35*  CREATININE 8.43* 9.35*  CALCIUM 9.0 8.8*   GFR: Estimated Creatinine Clearance: 6.2 mL/min (A) (by C-G formula based on SCr of 9.35 mg/dL (H)). Liver Function Tests: Recent Labs  Lab 09/26/20 1339  AST 26  ALT 18  ALKPHOS 111  BILITOT 0.7  PROT 6.6  ALBUMIN 3.0*   Urine analysis:    Component Value Date/Time   COLORURINE YELLOW 06/05/2016 0328   APPEARANCEUR CLEAR 06/05/2016 0328   LABSPEC 1.010 06/05/2016 0328   PHURINE 5.0 06/05/2016 0328   GLUCOSEU NEGATIVE 06/05/2016 0328   HGBUR TRACE (A) 06/05/2016 0328   BILIRUBINUR NEGATIVE 06/05/2016 0328   KETONESUR NEGATIVE 06/05/2016 0328   PROTEINUR NEGATIVE 06/05/2016 0328   UROBILINOGEN 0.2 02/15/2010 0845   NITRITE NEGATIVE 06/05/2016 0328   LEUKOCYTESUR NEGATIVE 06/05/2016 0328   Recent Results (from the past 240 hour(s))  SARS CORONAVIRUS 2 (TAT 6-24 HRS) Nasopharyngeal Nasopharyngeal Swab     Status: None   Collection Time: 09/26/20 10:02 PM   Specimen: Nasopharyngeal Swab  Result Value Ref  Range Status   SARS Coronavirus 2 NEGATIVE NEGATIVE Final    Comment: (NOTE) SARS-CoV-2 target nucleic acids are NOT DETECTED.  The SARS-CoV-2 RNA is generally detectable in upper and lower respiratory specimens during the acute phase of infection. Negative results do not preclude SARS-CoV-2 infection, do not rule out co-infections with other pathogens, and should not be used as the sole basis for treatment or other patient management decisions. Negative results must be combined with clinical observations, patient history, and epidemiological information. The expected result is Negative.  Fact Sheet for Patients: SugarRoll.be  Fact Sheet for Healthcare Providers: https://www.woods-mathews.com/  This test is not yet approved or cleared by the [place] FDA and  has been authorized for detection and/or diagnosis of SARS-CoV-2 by FDA under an Emergency Use Authorization (EUA). This EUA will remain  in effect (meaning this test can be used) for the duration of the COVID-19 declaration under Se ction 564(b)(1) of the Act, 21  U.S.C. section 360bbb-3(b)(1), unless the authorization is terminated or revoked sooner.  Performed at [facility], [place] [address] [place], [place] [address]       Radiology Studies: CT Head Wo Contrast  Result Date: 09/26/2020 CLINICAL DATA:  Change in mental status EXAM: CT HEAD WITHOUT CONTRAST TECHNIQUE: Contiguous axial images were obtained from the base of the skull through the vertex without intravenous contrast. COMPARISON:  December 08, 2019 FINDINGS: Brain: No evidence of acute territorial infarction, hemorrhage, hydrocephalus,extra-axial collection or mass lesion/mass effect. There is dilatation the ventricles and sulci consistent with age-related atrophy. Low-attenuation changes in the deep white matter consistent with small vessel ischemia. Vascular: No hyperdense vessel or unexpected calcification. Skull:  The skull is intact. No fracture or focal lesion identified. Sinuses/Orbits: The visualized paranasal sinuses and mastoid air cells are clear. The orbits and globes intact. Other: None IMPRESSION: No acute intracranial abnormality. Findings consistent with age related atrophy and chronic small vessel ischemia Electronically Signed   By: Prudencio Pair M.D.   On: 09/26/2020 18:29   CT Angio Chest PE W and/or Wo Contrast  Result Date: 09/26/2020 CLINICAL DATA:  Shortness of breath EXAM: CT ANGIOGRAPHY CHEST WITH CONTRAST TECHNIQUE: Multidetector CT imaging of the chest was performed using the standard protocol during bolus administration of intravenous contrast. Multiplanar CT image reconstructions and MIPs were obtained to evaluate the vascular anatomy. CONTRAST:  13mL OMNIPAQUE IOHEXOL 350 MG/ML SOLN COMPARISON:  10/13/2011 FINDINGS: Cardiovascular: Heavily calcified aorta and coronary arteries. No aneurysm. No filling defects in the pulmonary arteries to suggest pulmonary emboli. Cardiomegaly. Mediastinum/Nodes: No mediastinal, hilar, or axillary adenopathy. Trachea and esophagus are unremarkable. Thyroid unremarkable. Lungs/Pleura: Moderate bilateral pleural effusions. Compressive atelectasis in the lower lobes. Vascular congestion. Upper Abdomen: Imaging into the upper abdomen demonstrates no acute findings. Musculoskeletal: Bilateral gynecomastia. Well-circumscribed rounded lesion noted along the left anterior skin surface measures 3.3 cm, likely sebaceous cyst. No acute bony abnormality. Multiple old right posterior rib fractures. Review of the MIP images confirms the above findings. IMPRESSION: No evidence of pulmonary embolus. Cardiomegaly, diffuse coronary artery disease. Moderate bilateral pleural effusions. Compressive atelectasis in the lower lobes. Vascular congestion. Bilateral gynecomastia. Aortic Atherosclerosis (ICD10-I70.0). Electronically Signed   By: Rolm Baptise M.D.   On: 09/26/2020 18:33    DG Chest Portable 1 View  Result Date: 09/26/2020 CLINICAL DATA:  Shortness of breath EXAM: PORTABLE CHEST 1 VIEW COMPARISON:  06/10/2018 FINDINGS: Bilateral interstitial thickening. Trace bilateral pleural effusions. No focal consolidation. No pleural effusion or pneumothorax. Stable cardiomegaly. Thoracic aortic atherosclerosis. No acute osseous abnormality. IMPRESSION: 1. Mild CHF. Electronically Signed   By: Kathreen Devoid   On: 09/26/2020 14:19   ECHOCARDIOGRAM COMPLETE  Result Date: 09/27/2020    ECHOCARDIOGRAM REPORT   Patient Name:   HASEEB FIALLOS Date of Exam: 09/27/2020 Medical Rec #:  [id number]     Height:       68.0 in Accession #:    [id number]    Weight:       160.0 lb Date of Birth:  30-Sep-1940     BSA:          1.859 m Patient Age:    24 years      BP:           131/30 mmHg Patient Gender: M             HR:           60 bpm. Exam Location:  Inpatient Procedure: 2D  Echo, 3D Echo, Strain Analysis, Cardiac Doppler and Color Doppler Indications:    CHF AS  History:        Patient has prior history of Echocardiogram examinations, most                 recent 12/31/2019. Cardiomyopathy, Arrythmias:Atrial                 Fibrillation, Signs/Symptoms:Chest Pain and Murmur; Risk                 Factors:Hypertension and Dyslipidemia.  Sonographer:    [place] Referring Phys: [place]  1. Left ventricular ejection fraction, by estimation, is 35 to 40%. Left ventricular ejection fraction by 3D volume is 40 %. The left ventricle has moderately decreased function. The left ventricle demonstrates global hypokinesis. Left ventricular diastolic function could not be evaluated. The average left ventricular global longitudinal strain is -9.1 %. The global longitudinal strain is abnormal.  2. Moderate to severe aortic stenosis is present. Vmax 3.1 m/s, MG 22 mmHG, AVA 0.76 cm2, DI 0.20. EF is moderately reduced 35-40% with SVI 36 cc/m2. Findings could represent low-flow low-gradient  severe AS. Would recommend an aortic valve calcium score  for clarification of aortic valve stenosis severity. There is mild regurgitation. The aortic valve is calcified. There is severe calcifcation of the aortic valve. There is severe thickening of the aortic valve. Aortic valve regurgitation is mild. Moderate aortic valve stenosis.  3. Mild to Moderate calcific MS is present. MG 4.3 mmHG @ 57 bpm. Mild to moderate MR is present due to restricted PMVL movement in systole. The mitral valve is degenerative. Mild to moderate mitral valve regurgitation. Mild to moderate mitral stenosis.  Moderate to severe mitral annular calcification.  4. Right ventricular systolic function is moderately reduced. The right ventricular size is normal. There is moderately elevated pulmonary artery systolic pressure. The estimated right ventricular systolic pressure is 41.9 mmHg.  5. Left atrial size was severely dilated.  6. The inferior vena cava is normal in size with greater than 50% respiratory variability, suggesting right atrial pressure of 3 mmHg. Comparison(s): Changes from prior study are noted. EF now 35-40%. Moderate to severe AS is present. Elevated RVSP. FINDINGS  Left Ventricle: Left ventricular ejection fraction, by estimation, is 35 to 40%. Left ventricular ejection fraction by 3D volume is 40 %. The left ventricle has moderately decreased function. The left ventricle demonstrates global hypokinesis. The average left ventricular global longitudinal strain is -9.1 %. The global longitudinal strain is abnormal. The left ventricular internal cavity size was normal in size. There is no left ventricular hypertrophy. Left ventricular diastolic function could not be evaluated due to mitral annular calcification (moderate or greater). Left ventricular diastolic function could not be evaluated. Right Ventricle: The right ventricular size is normal. No increase in right ventricular wall thickness. Right ventricular systolic  function is moderately reduced. There is moderately elevated pulmonary artery systolic pressure. The tricuspid regurgitant velocity is 3.27 m/s, and with an assumed right atrial pressure of 3 mmHg, the estimated right ventricular systolic pressure is 37.9 mmHg. Left Atrium: Left atrial size was severely dilated. Right Atrium: Right atrial size was normal in size. Pericardium: Trivial pericardial effusion is present. Presence of pericardial fat pad. Mitral Valve: Mild to Moderate calcific MS is present. MG 4.3 mmHG @ 57 bpm. Mild to moderate MR is present due to restricted PMVL movement in systole. The mitral valve is degenerative in appearance. Moderate  to severe mitral annular calcification. Mild to moderate mitral valve regurgitation. Mild to moderate mitral valve stenosis. MV peak gradient, 10.8 mmHg. The mean mitral valve gradient is 4.3 mmHg with average heart rate of 57 bpm. Tricuspid Valve: The tricuspid valve is grossly normal. Tricuspid valve regurgitation is mild . No evidence of tricuspid stenosis. Aortic Valve: Moderate to severe aortic stenosis is present. Vmax 3.1 m/s, MG 22 mmHG, AVA 0.76 cm2, DI 0.20. EF is moderately reduced 35-40% with SVI 36 cc/m2. Findings could represent low-flow low-gradient severe AS. Would recommend an aortic valve calcium score for clarification of aortic valve stenosis severity. There is mild regurgitation. The aortic valve is calcified. There is severe calcifcation of the aortic valve. There is severe thickening of the aortic valve. Aortic valve regurgitation is  mild. Aortic regurgitation PHT measures 388 msec. Moderate aortic stenosis is present. Aortic valve mean gradient measures 22.0 mmHg. Aortic valve peak gradient measures 38.2 mmHg. Aortic valve area, by VTI measures 0.76 cm. Pulmonic Valve: The pulmonic valve was grossly normal. Pulmonic valve regurgitation is not visualized. No evidence of pulmonic stenosis. Aorta: The aortic root and ascending aorta are  structurally normal, with no evidence of dilitation. Venous: The inferior vena cava is normal in size with greater than 50% respiratory variability, suggesting right atrial pressure of 3 mmHg. IAS/Shunts: The atrial septum is grossly normal.  LEFT VENTRICLE PLAX 2D LVIDd:         5.50 cm         Diastology LVIDs:         4.80 cm         LV e' medial:    3.77 cm/s LV PW:         1.20 cm         LV E/e' medial:  45.6 LV IVS:        0.90 cm         LV e' lateral:   4.95 cm/s LVOT diam:     2.18 cm         LV E/e' lateral: 34.7 LV SV:         66 LV SV Index:   36              2D LVOT Area:     3.73 cm        Longitudinal                                Strain                                2D Strain GLS  -9.1 %                                Avg:                                 3D Volume EF                                LV 3D EF:    Left  ventricular                                             ejection                                             fraction by                                             3D volume                                             is 40 %.                                 3D Volume EF:                                3D EF:        40 % RIGHT VENTRICLE RV S prime:     9.45 cm/s  PULMONARY VEINS TAPSE (M-mode): 1.6 cm     A Reversal Duration: 92.00 msec                            A Reversal Velocity: 18.90 cm/s                            Diastolic Velocity:  59.56 cm/s                            S/D Velocity:        0.30                            Systolic Velocity:   38.75 cm/s LEFT ATRIUM            Index LA Vol (A4C): 113.0 ml 60.79 ml/m  AORTIC VALVE                    PULMONIC VALVE AV Area (Vmax):    0.74 cm     PV Vmax:       0.82 m/s AV Area (Vmean):   0.77 cm     PV Vmean:      63.600 cm/s AV Area (VTI):     0.76 cm     PV VTI:        0.219 m AV Vmax:           309.00 cm/s  PV Peak grad:  2.7 mmHg AV Vmean:          221.500 cm/s PV Mean grad:   2.0 mmHg AV VTI:            0.879 m AV Peak Grad:      38.2 mmHg  AV Mean Grad:      22.0 mmHg LVOT Vmax:         61.30 cm/s LVOT Vmean:        45.600 cm/s LVOT VTI:          0.178 m LVOT/AV VTI ratio: 0.20 AI PHT:            388 msec  AORTA Ao Root diam: 4.00 cm Ao Asc diam:  2.90 cm MITRAL VALVE                TRICUSPID VALVE MV Area (PHT): 2.55 cm     TR Peak grad:   42.8 mmHg MV Area VTI:   1.18 cm     TR Vmax:        327.00 cm/s MV Peak grad:  10.8 mmHg MV Mean grad:  4.3 mmHg     SHUNTS MV Vmax:       1.64 m/s     Systemic VTI:  0.18 m MV Vmean:      90.2 cm/s    Systemic Diam: 2.18 cm MV VTI:        0.56 m MV Decel Time: 298 msec MR Peak grad: 109.0 mmHg MR Mean grad: 69.0 mmHg MR Vmax:      522.00 cm/s MR Vmean:     392.0 cm/s MV E velocity: 172.00 cm/s MV A velocity: 91.90 cm/s MV E/A ratio:  1.87 Eleonore Chiquito MD Electronically signed by Eleonore Chiquito MD Signature Date/Time: 09/27/2020/2:34:40 PM    Final     Scheduled Meds: . allopurinol  100 mg Oral Daily  . amiodarone  200 mg Oral q AM  . aspirin EC  81 mg Oral Daily  . atorvastatin  40 mg Oral QPM  . cholecalciferol  5,000 Units Oral Daily  . clopidogrel  75 mg Oral q AM  . [START ON 09/28/2020] feeding supplement (NEPRO CARB STEADY)  237 mL Oral Q M,W,F  . heparin  5,000 Units Subcutaneous Q8H  . levothyroxine  75 mcg Oral QAC breakfast  . melatonin  10 mg Oral QHS  . [START ON 09/28/2020] midodrine  5 mg Oral Once per day on Mon Wed Fri  . multivitamin with minerals  1 tablet Oral Q breakfast  . QUEtiapine  25 mg Oral QHS  . sertraline  100 mg Oral q AM  . sodium chloride flush  3 mL Intravenous Q12H  . sucroferric oxyhydroxide  500 mg Oral TID with meals  . zinc sulfate  220 mg Oral q AM   Continuous Infusions: . sodium chloride       LOS: 1 day   Time spent: 35 minutes.  Patrecia Pour, MD [facility] [email or web address] 09/27/2020, 4:28 PM

## 2020-09-27 NOTE — Procedures (Signed)
   I was present at this dialysis session, have reviewed the session itself and made  appropriate changes Kelly Splinter MD [place] pager [phone]   09/27/2020, 2:40 PM

## 2020-09-27 NOTE — Progress Notes (Signed)
Hemodialysis- Tolerated treatment well. UF 2.5L, goal met.  Sats 96% on room air currently. Report called to 3E.

## 2020-09-27 NOTE — Progress Notes (Signed)
Heart Failure Navigator Progress Note  Assessed for Heart & Vascular TOC clinic readiness.  Unfortunately at this time the patient does not meet criteria due to pt on HD for ESRD.   Navigator available for reassessment of patient.   Pricilla Holm, RN, BSN Heart Failure Nurse Navigator [phone]

## 2020-09-27 NOTE — ED Notes (Signed)
Cleaned pt after he spilled his milk from breakfast all over him. Gown and linens changed at this time. Will continue to monitor.

## 2020-09-27 NOTE — Progress Notes (Signed)
80 year old male with ESRD on chronic HD MWF([facility]), CAD, ICM, moderate AS, hypertension presents to the ER with shortness of breath , missed dialysis this morning (has not missed any recent treatments) but noted OP [facility] post hd below edw  Past 3 txs . currently in observation , plan for dialysis today.  Currently on nasal cannula oxygen not in distress troponin is noted flat at 60.  OP HD= [facility], MWF, 4-hour, EDW 73 kg, 2K, 2 CA, heparin 2000, no vitamin D, last Mircera 150 on 08/12/2020, LFA AVF  If admitted will do full consult  Ernest Haber, PA-C [place] [phone] 09/27/2020,9:54 AM  LOS: 1 day   Labs: Basic Metabolic Panel: Recent Labs  Lab 09/26/20 1339 09/27/20 0331  NA 142 142  K 3.9 4.0  CL 100 99  CO2 30 31  GLUCOSE 100* 92  BUN 30* 35*  CREATININE 8.43* 9.35*  CALCIUM 9.0 8.8*   Liver Function Tests: Recent Labs  Lab 09/26/20 1339  AST 26  ALT 18  ALKPHOS 111  BILITOT 0.7  PROT 6.6  ALBUMIN 3.0*   No results for input(s): LIPASE, AMYLASE in the last 168 hours. No results for input(s): AMMONIA in the last 168 hours. CBC: Recent Labs  Lab 09/26/20 1339  WBC 11.9*  NEUTROABS 10.0*  HGB 11.7*  HCT 37.5*  MCV 100.5*  PLT 229   Cardiac Enzymes: No results for input(s): CKTOTAL, CKMB, CKMBINDEX, TROPONINI in the last 168 hours. CBG: Recent Labs  Lab 09/26/20 2239  GLUCAP 123*    Studies/Results: CT Head Wo Contrast  Result Date: 09/26/2020 CLINICAL DATA:  Change in mental status EXAM: CT HEAD WITHOUT CONTRAST TECHNIQUE: Contiguous axial images were obtained from the base of the skull through the vertex without intravenous contrast. COMPARISON:  December 08, 2019 FINDINGS: Brain: No evidence of acute territorial infarction, hemorrhage, hydrocephalus,extra-axial collection or mass lesion/mass effect. There is dilatation the ventricles and sulci consistent with age-related atrophy. Low-attenuation changes in the deep white matter  consistent with small vessel ischemia. Vascular: No hyperdense vessel or unexpected calcification. Skull: The skull is intact. No fracture or focal lesion identified. Sinuses/Orbits: The visualized paranasal sinuses and mastoid air cells are clear. The orbits and globes intact. Other: None IMPRESSION: No acute intracranial abnormality. Findings consistent with age related atrophy and chronic small vessel ischemia Electronically Signed   By: Prudencio Pair M.D.   On: 09/26/2020 18:29   CT Angio Chest PE W and/or Wo Contrast  Result Date: 09/26/2020 CLINICAL DATA:  Shortness of breath EXAM: CT ANGIOGRAPHY CHEST WITH CONTRAST TECHNIQUE: Multidetector CT imaging of the chest was performed using the standard protocol during bolus administration of intravenous contrast. Multiplanar CT image reconstructions and MIPs were obtained to evaluate the vascular anatomy. CONTRAST:  15mL OMNIPAQUE IOHEXOL 350 MG/ML SOLN COMPARISON:  10/13/2011 FINDINGS: Cardiovascular: Heavily calcified aorta and coronary arteries. No aneurysm. No filling defects in the pulmonary arteries to suggest pulmonary emboli. Cardiomegaly. Mediastinum/Nodes: No mediastinal, hilar, or axillary adenopathy. Trachea and esophagus are unremarkable. Thyroid unremarkable. Lungs/Pleura: Moderate bilateral pleural effusions. Compressive atelectasis in the lower lobes. Vascular congestion. Upper Abdomen: Imaging into the upper abdomen demonstrates no acute findings. Musculoskeletal: Bilateral gynecomastia. Well-circumscribed rounded lesion noted along the left anterior skin surface measures 3.3 cm, likely sebaceous cyst. No acute bony abnormality. Multiple old right posterior rib fractures. Review of the MIP images confirms the above findings. IMPRESSION: No evidence of pulmonary embolus. Cardiomegaly, diffuse coronary artery disease. Moderate bilateral pleural effusions. Compressive atelectasis  in the lower lobes. Vascular congestion. Bilateral gynecomastia.  Aortic Atherosclerosis (ICD10-I70.0). Electronically Signed   By: Rolm Baptise M.D.   On: 09/26/2020 18:33   DG Chest Portable 1 View  Result Date: 09/26/2020 CLINICAL DATA:  Shortness of breath EXAM: PORTABLE CHEST 1 VIEW COMPARISON:  06/10/2018 FINDINGS: Bilateral interstitial thickening. Trace bilateral pleural effusions. No focal consolidation. No pleural effusion or pneumothorax. Stable cardiomegaly. Thoracic aortic atherosclerosis. No acute osseous abnormality. IMPRESSION: 1. Mild CHF. Electronically Signed   By: Kathreen Devoid   On: 09/26/2020 14:19   Medications: . sodium chloride     . allopurinol  100 mg Oral Daily  . amiodarone  200 mg Oral q AM  . aspirin EC  81 mg Oral Daily  . atorvastatin  40 mg Oral QPM  . cholecalciferol  5,000 Units Oral Daily  . clopidogrel  75 mg Oral q AM  . [START ON 09/28/2020] feeding supplement (NEPRO CARB STEADY)  237 mL Oral Q M,W,F  . heparin  5,000 Units Subcutaneous Q8H  . levothyroxine  75 mcg Oral QAC breakfast  . melatonin  10 mg Oral QHS  . [START ON 09/28/2020] midodrine  5 mg Oral Once per day on Mon Wed Fri  . midodrine  5 mg Oral Once  . multivitamin with minerals  1 tablet Oral Q breakfast  . QUEtiapine  25 mg Oral QHS  . sertraline  100 mg Oral q AM  . sodium chloride flush  3 mL Intravenous Q12H  . sucroferric oxyhydroxide  500 mg Oral TID with meals  . zinc sulfate  220 mg Oral q AM

## 2020-09-27 NOTE — ED Notes (Signed)
ECHO at bedside, will be transported to dialysis after they finish.

## 2020-09-27 NOTE — Consult Note (Addendum)
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 the estimated right ventricular systolic pressure is 22.2 mmHg. Left Atrium: Left atrial size was severely dilated. Right Atrium: Right atrial size was normal in size. Pericardium: Trivial pericardial effusion is present. Presence of pericardial fat pad. Mitral Valve: Mild to Moderate calcific MS is present. MG 4.3 mmHG @ 57 bpm. Mild to moderate MR is present due to restricted PMVL movement in systole. The mitral valve is degenerative in appearance. Moderate to severe mitral annular calcification. Mild to moderate mitral valve regurgitation. Mild to moderate mitral valve stenosis. MV peak gradient, 10.8  mmHg. The mean mitral valve gradient is 4.3 mmHg with average heart rate of 57 bpm. Tricuspid Valve: The tricuspid valve is grossly normal. Tricuspid valve regurgitation is mild . No evidence of tricuspid stenosis. Aortic Valve: Moderate to severe aortic stenosis is present. Vmax 3.1 m/s, MG 22 mmHG, AVA 0.76 cm2, DI 0.20. EF is moderately reduced 35-40% with SVI 36 cc/m2. Findings could represent low-flow low-gradient severe AS. Would recommend an aortic valve calcium score for clarification of aortic valve stenosis severity. There is mild regurgitation. The aortic valve is calcified. There is severe calcifcation of the aortic valve. There is severe thickening of the aortic valve. Aortic valve regurgitation is  mild. Aortic regurgitation PHT measures 388 msec. Moderate aortic stenosis is present. Aortic valve mean gradient measures 22.0 mmHg. Aortic valve peak gradient measures 38.2 mmHg. Aortic valve area, by VTI measures 0.76 cm. Pulmonic Valve: The pulmonic valve was grossly normal. Pulmonic valve regurgitation is not visualized. No evidence of pulmonic stenosis. Aorta: The aortic root and ascending aorta are structurally normal, with no evidence of dilitation. Venous: The inferior vena cava is normal in size with greater than 50% respiratory variability, suggesting right atrial pressure of 3 mmHg. IAS/Shunts: The atrial septum is grossly normal.  LEFT VENTRICLE PLAX 2D LVIDd:         5.50 cm         Diastology LVIDs:         4.80 cm         LV e' medial:    3.77 cm/s LV PW:         1.20 cm         LV E/e' medial:  45.6 LV IVS:        0.90 cm         LV e' lateral:   4.95 cm/s LVOT diam:  2.18 cm         LV E/e' lateral: 34.7 LV SV:         66 LV SV Index:   36              2D LVOT Area:     3.73 cm        Longitudinal                                Strain                                2D Strain GLS  -9.1 %                                Avg:                                 3D Volume EF                                 LV 3D EF:    Left                                             ventricular                                             ejection                                             fraction by                                             3D volume                                             is 40 %.                                 3D Volume EF:                                3D EF:        40 % RIGHT VENTRICLE RV S prime:     9.45 cm/s  PULMONARY VEINS TAPSE (M-mode): 1.6 cm     A Reversal Duration: 92.00 msec                            A Reversal Velocity: 18.90  cm/s                            Diastolic Velocity:  90.24 cm/s                            S/D Velocity:        0.30                            Systolic Velocity:   09.73 cm/s LEFT ATRIUM            Index LA Vol (A4C): 113.0 ml 60.79 ml/m  AORTIC VALVE                    PULMONIC VALVE AV Area (Vmax):    0.74 cm     PV Vmax:       0.82 m/s AV Area (Vmean):   0.77 cm     PV Vmean:      63.600 cm/s AV Area (VTI):     0.76 cm     PV VTI:        0.219 m AV Vmax:           309.00 cm/s  PV Peak grad:  2.7 mmHg AV Vmean:          221.500 cm/s PV Mean grad:  2.0 mmHg AV VTI:            0.879 m AV Peak Grad:      38.2 mmHg AV Mean Grad:      22.0 mmHg LVOT Vmax:         61.30 cm/s LVOT Vmean:        45.600 cm/s LVOT VTI:          0.178 m LVOT/AV VTI ratio: 0.20 AI PHT:            388 msec  AORTA Ao Root diam: 4.00 cm Ao Asc diam:  2.90 cm MITRAL VALVE                TRICUSPID VALVE MV Area (PHT): 2.55 cm     TR Peak grad:   42.8 mmHg MV Area VTI:   1.18 cm     TR Vmax:        327.00 cm/s MV Peak grad:  10.8 mmHg MV Mean grad:  4.3 mmHg     SHUNTS MV Vmax:       1.64 m/s     Systemic VTI:  0.18 m MV Vmean:      90.2 cm/s    Systemic Diam: 2.18 cm MV VTI:        0.56 m MV Decel Time: 298 msec MR Peak grad: 109.0 mmHg MR Mean grad: 69.0 mmHg MR Vmax:      522.00 cm/s MR Vmean:     392.0 cm/s MV E velocity: 172.00 cm/s MV A velocity: 91.90 cm/s MV E/A ratio:  1.87  Eleonore Chiquito MD Electronically signed by Eleonore Chiquito MD Signature Date/Time: 09/27/2020/2:34:40 PM    Final      Assessment and Plan:   1. Acute on chronic combined systolic and diastolic heart failure: This is multifactorial in etiology including end-stage kidney disease with volume excess, structural significant valvular aortic and mitral stenosis, and dietary indiscretion with salt and water overload.  He is improved today after dialysis. 2. Calcific aortic stenosis,  possibly severe low-flow low gradient: This could potentially be worked up and treated with TAVR however that is not a prudent approach as a tissue valve will rapidly become dysfunctional in the setting of end-stage kidney disease. 3. Moderate mitral stenosis: This is related to calcific mitral valve and annulus related to end-stage kidney disease.  Not a candidate for valvular percutaneous therapies. 4. CAD with prior PCI of LAD and essentially occluded native RCA: Likely he is not exhibiting any symptoms of angina and troponins are flat.  An ischemic evaluation is not necessary. 5. 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  This is going to get worse as aortic stenosis and mitral stenosis progress.  Overall, the patient has a very terrible and life-threatening combination of problems.  Given his immobility, age, and frailty I do not believe that mitral and aortic valve percutaneous procedures are prudent and over the long haul would not significantly improve survival.  I did have conversation with the patient and daughter concerning declaring an appropriate DNR under the circumstances.  They are already being assisted by palliative care.  At the right time, Hospice would be a reasonable addition to the management strategy.  I do not recommend further testing  or management changes other than dialysis to a dry weight that removes volume but does not cause excessively low blood pressures.         This was an extremely long consultation lasting greater than 100 minutes with greater than 50% of the time spent in counseling and discussion of natural history of multiple disease processes.   [facility] will sign off.   Medication Recommendations: None Other recommendations (labs, testing, etc): None other than continued careful dialysis Follow up as an outpatient: Consider Hospice  For questions or updates, please contact [place] Please consult [email or web address] for contact info under    Signed, Sinclair Grooms, MD  09/27/2020 6:13 PM

## 2020-09-28 DIAGNOSIS — I5023 Acute on chronic systolic (congestive) heart failure: Secondary | ICD-10-CM | POA: Diagnosis not present

## 2020-09-28 LAB — CBC
HCT: 33.5 % — ABNORMAL LOW (ref 39.0–52.0)
Hemoglobin: 10.5 g/dL — ABNORMAL LOW (ref 13.0–17.0)
MCH: 31.4 pg (ref 26.0–34.0)
MCHC: 31.3 g/dL (ref 30.0–36.0)
MCV: 100.3 fL — ABNORMAL HIGH (ref 80.0–100.0)
Platelets: 217 10*3/uL (ref 150–400)
RBC: 3.34 MIL/uL — ABNORMAL LOW (ref 4.22–5.81)
RDW: 15.4 % (ref 11.5–15.5)
WBC: 7.4 10*3/uL (ref 4.0–10.5)
nRBC: 0 % (ref 0.0–0.2)
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
## 2020-09-28 NOTE — Progress Notes (Signed)
Pharmacist Heart Failure Core Measure Documentation  Assessment: Troy [name] has an EF documented as 35%  by ECHO  Rationale: Heart failure patients with left ventricular systolic dysfunction (LVSD) and an EF < 40% should be prescribed an angiotensin converting enzyme inhibitor (ACEI) or angiotensin receptor blocker (ARB) at discharge unless a contraindication is documented in the medical record.  This patient is not currently on an ACEI or ARB for HF.  This note is being placed in the record in order to provide documentation that a contraindication to the use of these agents is present for this encounter.  ACE Inhibitor or Angiotensin Receptor Blocker is contraindicated (specify all that apply)  []   ACEI allergy AND ARB allergy []   Angioedema []   Moderate or severe aortic stenosis []   Hyperkalemia []   Hypotension []   Renal artery stenosis [x]   Worsening renal function, preexisting renal disease or dysfunction    Bonnita Nasuti Pharm.D. CPP, BCPS Clinical Pharmacist [phone] 09/28/2020 6:02 PM

## 2020-09-28 NOTE — Progress Notes (Signed)
PROGRESS NOTE    Troy [name]  NOM:[id number] DOB: 07/12/1940 DOA: 09/26/2020 PCP: Troy Arabian, MD    Brief Narrative:  Troy [name] was admitted to the hospital working diagnosis acute on chronic systolic heart failure exacerbation.  80 year old male past medical history for end-stage renal disease on hemodialysis, coronary artery disease, ischemic cardiomyopathy and moderate aortic stenosis who presented with 3 days of worsening dyspnea.  On his initial physical examination blood pressure 170/76, heart rate 75, respiratory rate 18, oxygen saturation 96%, lungs with no wheezing or rales, heart S1-S2, present, rhythmic, soft abdomen, no lower extremity edema.  Patient underwent hemodialysis with ultrafiltration 3/22.  Further work-up with echocardiography showed reduction in LV systolic function 35 to 96% with global hypokinesis and moderate to severe aortic and mitral stenosis.  Assessment & Plan:   Principal Problem:   Acute on chronic systolic CHF (congestive heart failure) (HCC) Active Problems:   HTN (hypertension)   ESRD (end stage renal disease) (HCC)   Paroxysmal A-fib (HCC)   Ischemic cardiomyopathy   Hypertensive urgency   Aortic stenosis   1. Acute on chronic systolic heart failure in the setting of aortic and mitral stenosis. Patient tolerated well HD with ultrafiltration today, dyspnea has been improving.  Unfortunately patient not candidate for valve intervention.   Continue close monitoring of volume status as outpatient and management through ultrafiltration. Will address code status before discharge with him and his daughter.   2. ESRD on HD. M-W-F. Tolerating HD well, continue with regular schedule. Anemia of chronic renal disease/ iron def anemia, with Hgb at 10,5 and Hct at 33.5  On vit D3 and sucroferric Midodrine for hypotension on HD.   3. CAD/ HTN/ dyslipidemia. Patient continue chest pain free, continue dual antiplatelet therapy with asa and clopidogrel,  statin therapy.   Continue with atorvastatin.   4. Paroxysmal atrial fibrillation. Continue rate control with amiodarone.  Not candidate for full anticoagulation due to risk of bleeding and fall risk.   5. Depression/ deconditioning/ ambulatory dysfunction. Continue with seroquel and setraline.  At home uses walker and wheelchair   6. Gout. No acute flare, continue with allopurinol.      Status is: Inpatient  Remains inpatient appropriate because:Inpatient level of care appropriate due to severity of illness   Dispo: The patient is from: Home              Anticipated d/c is to: Home              Patient currently is not medically stable to d/c.   Difficult to place patient No   DVT prophylaxis: Heparin   Code Status:   full  Family Communication:  I spoke with patient's daughter at the bedside, we talked in detail about patient's condition, plan of care and prognosis and all questions were addressed.      Consultants:   Nephrology   Cardiology     Subjective: Patient is feeling better, dyspnea is improving, no nausea or vomiting, no chest pain.   Objective: Vitals:   09/28/20 1030 09/28/20 1100 09/28/20 1117 09/28/20 1207  BP: (!) 119/18 (!) 116/37 (!) 140/48 (!) 94/42  Pulse: 62 66 63 (!) 59  Resp: 17  (!) 22 18  Temp:   97.9 F (36.6 C) 97.7 F (36.5 C)  TempSrc:   Oral Oral  SpO2:   97% 95%  Weight:   65.2 kg   Height:        Intake/Output Summary (Last 24 hours) at  09/28/2020 1439 Last data filed at 09/28/2020 1435 Gross per 24 hour  Intake 483 ml  Output 4500 ml  Net -4017 ml   Filed Weights   09/28/20 0037 09/28/20 0735 09/28/20 1117  Weight: 71.2 kg 71.2 kg 65.2 kg    Examination:   General: Not in pain or dyspnea, deconditioned  Neurology: Awake and alert, non focal  E ENT: positive pallor, no icterus, oral mucosa moist Cardiovascular: No JVD. S1-S2 present, rhythmic, no gallops, rubs, or murmurs. No lower extremity edema. Pulmonary:  positive breath sounds bilaterally, adequate air movement, no wheezing, rhonchi or rales. Gastrointestinal. Abdomen soft and non tender Skin. No rashes Musculoskeletal: no joint deformities     Data Reviewed: I have personally reviewed following labs and imaging studies  CBC: Recent Labs  Lab 09/26/20 1339 09/28/20 0738  WBC 11.9* 7.4  NEUTROABS 10.0*  --   HGB 11.7* 10.5*  HCT 37.5* 33.5*  MCV 100.5* 100.3*  PLT 229 585   Basic Metabolic Panel: Recent Labs  Lab 09/26/20 1339 09/27/20 0331 09/28/20 0326  NA 142 142 135  K 3.9 4.0 3.3*  CL 100 99 96*  CO2 30 31 30   GLUCOSE 100* 92 84  BUN 30* 35* 21  CREATININE 8.43* 9.35* 5.28*  CALCIUM 9.0 8.8* 8.3*   GFR: Estimated Creatinine Clearance: 10.5 mL/min (A) (by C-G formula based on SCr of 5.28 mg/dL (H)). Liver Function Tests: Recent Labs  Lab 09/26/20 1339  AST 26  ALT 18  ALKPHOS 111  BILITOT 0.7  PROT 6.6  ALBUMIN 3.0*   No results for input(s): LIPASE, AMYLASE in the last 168 hours. No results for input(s): AMMONIA in the last 168 hours. Coagulation Profile: No results for input(s): INR, PROTIME in the last 168 hours. Cardiac Enzymes: No results for input(s): CKTOTAL, CKMB, CKMBINDEX, TROPONINI in the last 168 hours. BNP (last 3 results) No results for input(s): PROBNP in the last 8760 hours. HbA1C: No results for input(s): HGBA1C in the last 72 hours. CBG: Recent Labs  Lab 09/26/20 2239  GLUCAP 123*   Lipid Profile: No results for input(s): CHOL, HDL, LDLCALC, TRIG, CHOLHDL, LDLDIRECT in the last 72 hours. Thyroid Function Tests: No results for input(s): TSH, T4TOTAL, FREET4, T3FREE, THYROIDAB in the last 72 hours. Anemia Panel: No results for input(s): VITAMINB12, FOLATE, FERRITIN, TIBC, IRON, RETICCTPCT in the last 72 hours.    Radiology Studies: I have reviewed all of the imaging during this hospital visit personally     Scheduled Meds:  allopurinol  100 mg Oral Daily    amiodarone  200 mg Oral q AM   aspirin EC  81 mg Oral Daily   atorvastatin  40 mg Oral QPM   cholecalciferol  5,000 Units Oral Daily   clopidogrel  75 mg Oral q AM   feeding supplement (NEPRO CARB STEADY)  237 mL Oral Q M,W,F   heparin  5,000 Units Subcutaneous Q8H   levothyroxine  75 mcg Oral QAC breakfast   melatonin  10 mg Oral QHS   midodrine  5 mg Oral Once per day on Mon Wed Fri   multivitamin with minerals  1 tablet Oral Q breakfast   QUEtiapine  25 mg Oral QHS   sertraline  100 mg Oral q AM   sodium chloride flush  3 mL Intravenous Q12H   sucroferric oxyhydroxide  500 mg Oral TID with meals   zinc sulfate  220 mg Oral q AM   Continuous Infusions:  sodium chloride  LOS: 2 days        Troy [name] Gerome Apley, MD

## 2020-09-28 NOTE — Progress Notes (Signed)
Bed bath and CHG bath provided to patient; no central line access is present but patient does have HD fistula with a MWF schedule.

## 2020-09-29 DIAGNOSIS — N186 End stage renal disease: Secondary | ICD-10-CM | POA: Diagnosis not present

## 2020-09-29 DIAGNOSIS — I050 Rheumatic mitral stenosis: Secondary | ICD-10-CM

## 2020-09-29 DIAGNOSIS — I350 Nonrheumatic aortic (valve) stenosis: Secondary | ICD-10-CM | POA: Diagnosis not present

## 2020-09-29 DIAGNOSIS — I10 Essential (primary) hypertension: Secondary | ICD-10-CM | POA: Diagnosis not present

## 2020-09-29 DIAGNOSIS — I5023 Acute on chronic systolic (congestive) heart failure: Secondary | ICD-10-CM | POA: Diagnosis not present

## 2020-09-29 LAB — BASIC METABOLIC PANEL WITH GFR
Anion gap: 10 (ref 5–15)
BUN: 20 mg/dL (ref 8–23)
CO2: 29 mmol/L (ref 22–32)
Calcium: 9 mg/dL (ref 8.9–10.3)
Chloride: 98 mmol/L (ref 98–111)
Creatinine, Ser: 4.58 mg/dL — ABNORMAL HIGH (ref 0.61–1.24)
GFR, Estimated: 12 mL/min — ABNORMAL LOW
Glucose, Bld: 92 mg/dL (ref 70–99)
Potassium: 3.6 mmol/L (ref 3.5–5.1)
Sodium: 137 mmol/L (ref 135–145)

## 2020-09-29 LAB — BASIC METABOLIC PANEL
Anion gap: 9 (ref 5–15)
BUN: 21 mg/dL (ref 8–23)
CO2: 30 mmol/L (ref 22–32)
Calcium: 8.3 mg/dL — ABNORMAL LOW (ref 8.9–10.3)
Chloride: 96 mmol/L — ABNORMAL LOW (ref 98–111)
Creatinine, Ser: 5.28 mg/dL — ABNORMAL HIGH (ref 0.61–1.24)
GFR, Estimated: 10 mL/min — ABNORMAL LOW (ref >60)
Glucose, Bld: 84 mg/dL (ref 70–99)
Potassium: 3.3 mmol/L — ABNORMAL LOW (ref 3.5–5.1)
Sodium: 135 mmol/L (ref 135–145)
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
## 2020-09-29 NOTE — Progress Notes (Signed)
[facility] Progress Note  Subjective: feeling good, going home today  Vitals:   09/28/20 1207 09/28/20 1606 09/28/20 2037 09/29/20 0443  BP: (!) 94/42 (!) 151/25 (!) 143/26 (!) 118/18  Pulse: (!) 59 (!) 58 (!) 58 61  Resp: 18 18 16 16   Temp: 97.7 F (36.5 C) 99.5 F (37.5 C) 98.1 F (36.7 C) 97.8 F (36.6 C)  TempSrc: Oral Oral Oral Oral  SpO2: 95% 93% 95% 94%  Weight:    73.5 kg  Height:        Exam: General: alert HOH  Elderly male nad HEENT: [place] , eomi, not icteric  Neck: No JVD Heart: RRR , 2/6 mur. No rub or gallop Lungs: CTA, nonlabored Abdomen: BS pos , soft ,NT, ND No Ascires  Extremities: NOpedal edema Skin: Multiple skin bruise upper/lower extremities and chest, no open wounds  Neuro: alert HOH, no acute focal deficits appreciated  Dialysis Access: pos bruit LFA AVF    OP HD=[facility], MWF, 4-hour, EDW 73 kg, 2K, 2 CA, heparin 2000, no vitamin D, last Mircera 150 on 08/12/2020, LFA AVF  Assessment/Plan 1. Acute on chronic Syst CHF-  in setting of Aortic stenosis and mitral stenosis/EF 40% /dyspnea resolved with serial HD. Had UF 2.5 3/22 and 2 L on 3/23. Stood up to weight post HD yesterday at 65.2 kg, which is is 8 kg under his prior dry wt. Going home today and lowering edw 2. DNR - new DNR ordered this admission after primary and cardiology discussions w/ pt and family. Poor prognosis per cardiology due to significant valve issues (mitral and aortic valve stenoses). Not a candidate for invasive procedures. Hospice may need to be considered at some point.  3. Hypertension/volume - uses midodrine for BP support pre HD. Lowering edw at dc to 65 kg approx as above.   4. ESRD -HD MWF. Next HD Friday.  5. Anemia of ESRD-Hgb 10.3< 11.7, no current ESA needs, follow-up Hgb 6. Metabolic bone disease -calcium stable for as phosphate binder 7. H/o CAD 8. H/o TIAs /carotid artery disease - status post CEA 9. HO A. Fib - not on AC secondary to fall risk,  denies chest pain this admit just shortness of breath now resolved 10. Nutrition -ALB 3.0 renal diet ,on [place] abdomen      Rob Lamar Naef 09/29/2020, 11:17 AM   Recent Labs  Lab 09/26/20 1339 09/27/20 0331 09/28/20 0326 09/28/20 0738 09/29/20 0308  K 3.9   < > 3.3*  --  3.6  BUN 30*   < > 21  --  20  CREATININE 8.43*   < > 5.28*  --  4.58*  CALCIUM 9.0   < > 8.3*  --  9.0  HGB 11.7*  --   --  10.5*  --    < > = values in this interval not displayed.   Inpatient medications: . allopurinol  100 mg Oral Daily  . amiodarone  200 mg Oral q AM  . aspirin EC  81 mg Oral Daily  . atorvastatin  40 mg Oral QPM  . cholecalciferol  5,000 Units Oral Daily  . clopidogrel  75 mg Oral q AM  . feeding supplement (NEPRO CARB STEADY)  237 mL Oral Q M,W,F  . heparin  5,000 Units Subcutaneous Q8H  . levothyroxine  75 mcg Oral QAC breakfast  . melatonin  10 mg Oral QHS  . midodrine  5 mg Oral Once per day on Mon Wed Fri  . multivitamin  with minerals  1 tablet Oral Q breakfast  . QUEtiapine  25 mg Oral QHS  . sertraline  100 mg Oral q AM  . sodium chloride flush  3 mL Intravenous Q12H  . sucroferric oxyhydroxide  500 mg Oral TID with meals  . zinc sulfate  220 mg Oral q AM   . sodium chloride     sodium chloride, acetaminophen, lidocaine-prilocaine, ondansetron (ZOFRAN) IV, polyethylene glycol, sodium chloride flush

## 2020-09-29 NOTE — Plan of Care (Signed)

## 2020-09-29 NOTE — Progress Notes (Signed)
D/C instructions given and reviewed. Tele and IV's removed, tolerated well. Daughter will transport home around noon.

## 2020-09-29 NOTE — Discharge Summary (Signed)
Physician Discharge Summary  Troy [name] JTT:[id number] DOB: 1941-06-11 DOA: 09/26/2020  PCP: Gaynelle Arabian, MD  Admit date: 09/26/2020 Discharge date: 09/29/2020  Admitted From: Home  Disposition:  Home   Recommendations for Outpatient Follow-up and new medication changes:  1. Follow up with Dr. Marisue Humble in 7 days.  2. Patient with severe aortic stenosis, poor prognosis, code status changed to DNR.  3. Patient's weight after HD 65.2, 8 kg below is outpatient dry weight.   I spoke over the phone with the patient's daughter about patient's  condition, plan of care, prognosis and all questions were addressed.  Home Health: no  Equipment/Devices: na   Discharge Condition: stable  CODE STATUS: DNR   Diet recommendation: heart healthy   Brief/Interim Summary: Mr. Troy [name] was admitted to the hospital with the working diagnosis acute on chronic systolic heart failure exacerbation in the setting of ERSD and moderate to severe aortic and mitral stenosis.  80 year old male past medical history for end-stage renal disease on hemodialysis, coronary artery disease, ischemic cardiomyopathy and moderate aortic stenosis who presented with 3 days of worsening dyspnea.  On his initial physical examination blood pressure 170/76, heart rate 75, respiratory rate 18, oxygen saturation 96%, lungs with no wheezing or rales, heart S1-S2, present, rhythmic, soft abdomen, no lower extremity edema.  Sodium 142, potassium 3.9, chloride 100, bicarb 30, glucose 100, BUN 30, creatinine 8.43, troponin I 61-60, BNP > 4500, white count 11.9, hemoglobin 11.7, hematocrit 37.5, platelets 229. SARS COVID-19 negative.  Head CT negative for acute changes. Chest radiograph with bibasilar atelectasis, increased lung markings bilaterally. CT chest with bilateral groundglass opacities, bilateral pleural effusions right greater than left.  Negative for pulmonary embolism.  EKG 69 bpm, normal axis, QTC 537, sinus rhythm, poor R  wave progression, ST depressions in V4-V6, no significant T wave changes, positive LVH.  Patient underwent hemodialysis with ultrafiltration 3/22 and 03/23, total 4.5 L.  Further work-up with echocardiography showed reduction in LV systolic function 35 to 00% with global hypokinesis and moderate to severe aortic and mitral stenosis.   1.  Acute on chronic systolic heart failure decompensation, in the setting of moderate to severe aortic and mitral stenosis. Patient was admitted to the telemetry ward, he underwent hemodialysis with ultrafiltration for 2 consecutive days, March 22 and March 23, total fluid removed 4.5 L, with good toleration.  At discharge his blood pressure is 118/18 with a heart rate of 61.  Patient with advanced valvular heart disease, aortic stenosis and mitral stenosis, poor prognosis.  Currently he is not candidate for invasive intervention.  He and his daughter were made aware of his situation, CODE STATUS has been changed to DNR.  Continue fluid management through hemodialysis/ultrafiltration.  2.  End-stage renal disease on hemodialysis.  Patient will have his next hemodialysis on 03/25, per his outpatient schedule. Anemia of chronic renal disease and iron deficiency anemia.  Continue vitamin D3 and iron supplementation.  Continue with midodrine for hypotension related to hemodialysis.   3.  Coronary disease, hypertension, dyslipidemia.  Patient remained chest pain-free, continue dual antiplatelet therapy with aspirin and clopidogrel. Continue with atorvastatin and isosorbide mononitrate.  4.  Paroxysmal atrial fibrillation, prolonged QTC.  Patient remains sinus rhythm, continue rate control with amiodarone.  Avoid QT prolonging medications.  5.  Depression/deconditioning/ambulatory dysfunction.  Continue Seroquel and sertraline.  At home patient uses a walker and a wheelchair for mobility.  6.  Gout.  No acute flare, continue allopurinol.   Discharge Diagnoses:  Principal Problem:   Acute on chronic systolic CHF (congestive heart failure) (HCC) Active Problems:   HTN (hypertension)   ESRD (end stage renal disease) (HCC)   Paroxysmal A-fib (HCC)   Ischemic cardiomyopathy   Aortic stenosis   Mitral stenosis    Discharge Instructions   Allergies as of 09/29/2020      Reactions   Penicillin G Anaphylaxis, Other (See Comments)   Syncope, also   Penicillins Anaphylaxis, Other (See Comments)   Passed out and Syncope Did it involve swelling of the face/tongue/throat, SOB, or low BP? Yes Did it involve sudden or severe rash/hives, skin peeling, or any reaction on the inside of your mouth or nose? No Did you need to seek medical attention at a hospital or doctor's office? Yes When did it last happen?Unk If all above answers are "NO", may proceed with cephalosporin use.   Tetanus Immune Globulin Anaphylaxis   Tetanus Toxoid, Adsorbed Anaphylaxis   Tetanus Toxoids Anaphylaxis   Sulfa Antibiotics Nausea And Vomiting, Other (See Comments)   Violent reaction      Medication List    TAKE these medications   acetaminophen 500 MG tablet Commonly known as: TYLENOL Take 500 mg by mouth every 6 (six) hours as needed for moderate pain or mild pain.   allopurinol 100 MG tablet Commonly known as: ZYLOPRIM Take 100 mg by mouth daily.   amiodarone 200 MG tablet Commonly known as: PACERONE 1 tab daily What changed:   how much to take  how to take this  when to take this   ascorbic acid 500 MG tablet Commonly known as: VITAMIN C Take 500 mg by mouth daily.   aspirin 81 MG EC tablet Take 1 tablet (81 mg total) by mouth daily.   atorvastatin 40 MG tablet Commonly known as: LIPITOR Take 40 mg by mouth every evening.   clopidogrel 75 MG tablet Commonly known as: PLAVIX Take 75 mg by mouth in the morning.   ethyl chloride spray Apply 1 application topically See admin instructions. Spray affected site before dialysis on Mon/Wed/Fri    feeding supplement (NEPRO CARB STEADY) Liqd Take 237 mLs by mouth 2 (two) times daily between meals. What changed: when to take this   Fish Oil 1000 MG Caps Take 1,000 mg by mouth at bedtime.   hydrocortisone cream 1 % Apply 1 application topically 2 (two) times daily as needed for itching (or yeast).   isosorbide mononitrate 30 MG 24 hr tablet Commonly known as: IMDUR Take 1 tablet (30 mg total) by mouth daily. What changed: when to take this   levothyroxine 75 MCG tablet Commonly known as: SYNTHROID Take 1 tablet (75 mcg total) by mouth daily before breakfast.   lidocaine 5 % Commonly known as: LIDODERM Place 1 patch onto the skin daily as needed (for back pain). Remove & Discard patch within 12 hours or as directed by MD   lidocaine-prilocaine cream Commonly known as: EMLA Apply topically every Monday, Wednesday, and Friday.   Melatonin 10 MG Tabs Take 10 mg by mouth at bedtime.   midodrine 10 MG tablet Commonly known as: PROAMATINE Take 5 mg by mouth See admin instructions. Take 5 mg by mouth in the morning only on Mon/Wed/Fri- before dialysis   nitroGLYCERIN 0.4 MG SL tablet Commonly known as: NITROSTAT PLACE 1 TABLET UNDER THE TONGUE EVERY 5 MINUTES AS NEEDED FOR CHEST PAIN. MAX OF 3 DOSES What changed: See the new instructions.   One Daily For Men 50+ Advanced Tabs  Take 1 tablet by mouth daily with breakfast.   polyethylene glycol 17 g packet Commonly known as: MIRALAX / GLYCOLAX Take 17 g by mouth daily as needed for mild constipation (MIX AND DRINK).   QUEtiapine 25 MG tablet Commonly known as: SEROQUEL Take 25 mg by mouth at bedtime.   sertraline 100 MG tablet Commonly known as: ZOLOFT Take 100 mg by mouth in the morning.   Velphoro 500 MG chewable tablet Generic drug: sucroferric oxyhydroxide Chew 500 mg by mouth See admin instructions. Crush 1 tablet (500 mg) into applesauce and consume with breakfast and evening meal   VITAMIN B-12 PO Take 1  tablet by mouth daily.   Vitamin D3 125 MCG (5000 UT) Caps Take 5,000 Units by mouth daily.   zinc gluconate 50 MG tablet Take 50 mg by mouth in the morning.       Allergies  Allergen Reactions  . Penicillin G Anaphylaxis and Other (See Comments)    Syncope, also  . Penicillins Anaphylaxis and Other (See Comments)    Passed out and Syncope Did it involve swelling of the face/tongue/throat, SOB, or low BP? Yes Did it involve sudden or severe rash/hives, skin peeling, or any reaction on the inside of your mouth or nose? No Did you need to seek medical attention at a hospital or doctor's office? Yes When did it last happen?Unk If all above answers are "NO", may proceed with cephalosporin use.  . Tetanus Immune Globulin Anaphylaxis  . Tetanus Toxoid, Adsorbed Anaphylaxis  . Tetanus Toxoids Anaphylaxis  . Sulfa Antibiotics Nausea And Vomiting and Other (See Comments)    Violent reaction    Consultations:  Nephrology   Cardiology    Procedures/Studies: CT Head Wo Contrast  Result Date: 09/26/2020 CLINICAL DATA:  Change in mental status EXAM: CT HEAD WITHOUT CONTRAST TECHNIQUE: Contiguous axial images were obtained from the base of the skull through the vertex without intravenous contrast. COMPARISON:  December 08, 2019 FINDINGS: Brain: No evidence of acute territorial infarction, hemorrhage, hydrocephalus,extra-axial collection or mass lesion/mass effect. There is dilatation the ventricles and sulci consistent with age-related atrophy. Low-attenuation changes in the deep white matter consistent with small vessel ischemia. Vascular: No hyperdense vessel or unexpected calcification. Skull: The skull is intact. No fracture or focal lesion identified. Sinuses/Orbits: The visualized paranasal sinuses and mastoid air cells are clear. The orbits and globes intact. Other: None IMPRESSION: No acute intracranial abnormality. Findings consistent with age related atrophy and chronic small vessel  ischemia Electronically Signed   By: Prudencio Pair M.D.   On: 09/26/2020 18:29   CT Angio Chest PE W and/or Wo Contrast  Result Date: 09/26/2020 CLINICAL DATA:  Shortness of breath EXAM: CT ANGIOGRAPHY CHEST WITH CONTRAST TECHNIQUE: Multidetector CT imaging of the chest was performed using the standard protocol during bolus administration of intravenous contrast. Multiplanar CT image reconstructions and MIPs were obtained to evaluate the vascular anatomy. CONTRAST:  60mL OMNIPAQUE IOHEXOL 350 MG/ML SOLN COMPARISON:  10/13/2011 FINDINGS: Cardiovascular: Heavily calcified aorta and coronary arteries. No aneurysm. No filling defects in the pulmonary arteries to suggest pulmonary emboli. Cardiomegaly. Mediastinum/Nodes: No mediastinal, hilar, or axillary adenopathy. Trachea and esophagus are unremarkable. Thyroid unremarkable. Lungs/Pleura: Moderate bilateral pleural effusions. Compressive atelectasis in the lower lobes. Vascular congestion. Upper Abdomen: Imaging into the upper abdomen demonstrates no acute findings. Musculoskeletal: Bilateral gynecomastia. Well-circumscribed rounded lesion noted along the left anterior skin surface measures 3.3 cm, likely sebaceous cyst. No acute bony abnormality. Multiple old right posterior rib fractures. Review  of the MIP images confirms the above findings. IMPRESSION: No evidence of pulmonary embolus. Cardiomegaly, diffuse coronary artery disease. Moderate bilateral pleural effusions. Compressive atelectasis in the lower lobes. Vascular congestion. Bilateral gynecomastia. Aortic Atherosclerosis (ICD10-I70.0). Electronically Signed   By: Rolm Baptise M.D.   On: 09/26/2020 18:33   DG Chest Portable 1 View  Result Date: 09/26/2020 CLINICAL DATA:  Shortness of breath EXAM: PORTABLE CHEST 1 VIEW COMPARISON:  06/10/2018 FINDINGS: Bilateral interstitial thickening. Trace bilateral pleural effusions. No focal consolidation. No pleural effusion or pneumothorax. Stable cardiomegaly.  Thoracic aortic atherosclerosis. No acute osseous abnormality. IMPRESSION: 1. Mild CHF. Electronically Signed   By: Kathreen Devoid   On: 09/26/2020 14:19   ECHOCARDIOGRAM COMPLETE  Result Date: 09/27/2020    ECHOCARDIOGRAM REPORT   Patient Name:   IRVAN TIEDT Date of Exam: 09/27/2020 Medical Rec #:  [id number]     Height:       68.0 in Accession #:    [id number]    Weight:       160.0 lb Date of Birth:  28-Jul-1940     BSA:          1.859 m Patient Age:    58 years      BP:           131/30 mmHg Patient Gender: M             HR:           60 bpm. Exam Location:  Inpatient Procedure: 2D Echo, 3D Echo, Strain Analysis, Cardiac Doppler and Color Doppler Indications:    CHF AS  History:        Patient has prior history of Echocardiogram examinations, most                 recent 12/31/2019. Cardiomyopathy, Arrythmias:Atrial                 Fibrillation, Signs/Symptoms:Chest Pain and Murmur; Risk                 Factors:Hypertension and Dyslipidemia.  Sonographer:    [place] Referring Phys: [place]  1. Left ventricular ejection fraction, by estimation, is 35 to 40%. Left ventricular ejection fraction by 3D volume is 40 %. The left ventricle has moderately decreased function. The left ventricle demonstrates global hypokinesis. Left ventricular diastolic function could not be evaluated. The average left ventricular global longitudinal strain is -9.1 %. The global longitudinal strain is abnormal.  2. Moderate to severe aortic stenosis is present. Vmax 3.1 m/s, MG 22 mmHG, AVA 0.76 cm2, DI 0.20. EF is moderately reduced 35-40% with SVI 36 cc/m2. Findings could represent low-flow low-gradient severe AS. Would recommend an aortic valve calcium score  for clarification of aortic valve stenosis severity. There is mild regurgitation. The aortic valve is calcified. There is severe calcifcation of the aortic valve. There is severe thickening of the aortic valve. Aortic valve regurgitation is mild.  Moderate aortic valve stenosis.  3. Mild to Moderate calcific MS is present. MG 4.3 mmHG @ 57 bpm. Mild to moderate MR is present due to restricted PMVL movement in systole. The mitral valve is degenerative. Mild to moderate mitral valve regurgitation. Mild to moderate mitral stenosis.  Moderate to severe mitral annular calcification.  4. Right ventricular systolic function is moderately reduced. The right ventricular size is normal. There is moderately elevated pulmonary artery systolic pressure. The estimated right ventricular systolic pressure is 40.1 mmHg.  5. Left  atrial size was severely dilated.  6. The inferior vena cava is normal in size with greater than 50% respiratory variability, suggesting right atrial pressure of 3 mmHg. Comparison(s): Changes from prior study are noted. EF now 35-40%. Moderate to severe AS is present. Elevated RVSP. FINDINGS  Left Ventricle: Left ventricular ejection fraction, by estimation, is 35 to 40%. Left ventricular ejection fraction by 3D volume is 40 %. The left ventricle has moderately decreased function. The left ventricle demonstrates global hypokinesis. The average left ventricular global longitudinal strain is -9.1 %. The global longitudinal strain is abnormal. The left ventricular internal cavity size was normal in size. There is no left ventricular hypertrophy. Left ventricular diastolic function could not be evaluated due to mitral annular calcification (moderate or greater). Left ventricular diastolic function could not be evaluated. Right Ventricle: The right ventricular size is normal. No increase in right ventricular wall thickness. Right ventricular systolic function is moderately reduced. There is moderately elevated pulmonary artery systolic pressure. The tricuspid regurgitant velocity is 3.27 m/s, and with an assumed right atrial pressure of 3 mmHg, the estimated right ventricular systolic pressure is 09.3 mmHg. Left Atrium: Left atrial size was severely  dilated. Right Atrium: Right atrial size was normal in size. Pericardium: Trivial pericardial effusion is present. Presence of pericardial fat pad. Mitral Valve: Mild to Moderate calcific MS is present. MG 4.3 mmHG @ 57 bpm. Mild to moderate MR is present due to restricted PMVL movement in systole. The mitral valve is degenerative in appearance. Moderate to severe mitral annular calcification. Mild to moderate mitral valve regurgitation. Mild to moderate mitral valve stenosis. MV peak gradient, 10.8 mmHg. The mean mitral valve gradient is 4.3 mmHg with average heart rate of 57 bpm. Tricuspid Valve: The tricuspid valve is grossly normal. Tricuspid valve regurgitation is mild . No evidence of tricuspid stenosis. Aortic Valve: Moderate to severe aortic stenosis is present. Vmax 3.1 m/s, MG 22 mmHG, AVA 0.76 cm2, DI 0.20. EF is moderately reduced 35-40% with SVI 36 cc/m2. Findings could represent low-flow low-gradient severe AS. Would recommend an aortic valve calcium score for clarification of aortic valve stenosis severity. There is mild regurgitation. The aortic valve is calcified. There is severe calcifcation of the aortic valve. There is severe thickening of the aortic valve. Aortic valve regurgitation is  mild. Aortic regurgitation PHT measures 388 msec. Moderate aortic stenosis is present. Aortic valve mean gradient measures 22.0 mmHg. Aortic valve peak gradient measures 38.2 mmHg. Aortic valve area, by VTI measures 0.76 cm. Pulmonic Valve: The pulmonic valve was grossly normal. Pulmonic valve regurgitation is not visualized. No evidence of pulmonic stenosis. Aorta: The aortic root and ascending aorta are structurally normal, with no evidence of dilitation. Venous: The inferior vena cava is normal in size with greater than 50% respiratory variability, suggesting right atrial pressure of 3 mmHg. IAS/Shunts: The atrial septum is grossly normal.  LEFT VENTRICLE PLAX 2D LVIDd:         5.50 cm         Diastology  LVIDs:         4.80 cm         LV e' medial:    3.77 cm/s LV PW:         1.20 cm         LV E/e' medial:  45.6 LV IVS:        0.90 cm         LV e' lateral:   4.95 cm/s LVOT diam:  2.18 cm         LV E/e' lateral: 34.7 LV SV:         66 LV SV Index:   36              2D LVOT Area:     3.73 cm        Longitudinal                                Strain                                2D Strain GLS  -9.1 %                                Avg:                                 3D Volume EF                                LV 3D EF:    Left                                             ventricular                                             ejection                                             fraction by                                             3D volume                                             is 40 %.                                 3D Volume EF:                                3D EF:        40 % RIGHT VENTRICLE RV S prime:     9.45 cm/s  PULMONARY VEINS TAPSE (M-mode): 1.6 cm     A Reversal Duration: 92.00 msec                            A Reversal Velocity: 18.90 cm/s  Diastolic Velocity:  32.67 cm/s                            S/D Velocity:        0.30                            Systolic Velocity:   12.45 cm/s LEFT ATRIUM            Index LA Vol (A4C): 113.0 ml 60.79 ml/m  AORTIC VALVE                    PULMONIC VALVE AV Area (Vmax):    0.74 cm     PV Vmax:       0.82 m/s AV Area (Vmean):   0.77 cm     PV Vmean:      63.600 cm/s AV Area (VTI):     0.76 cm     PV VTI:        0.219 m AV Vmax:           309.00 cm/s  PV Peak grad:  2.7 mmHg AV Vmean:          221.500 cm/s PV Mean grad:  2.0 mmHg AV VTI:            0.879 m AV Peak Grad:      38.2 mmHg AV Mean Grad:      22.0 mmHg LVOT Vmax:         61.30 cm/s LVOT Vmean:        45.600 cm/s LVOT VTI:          0.178 m LVOT/AV VTI ratio: 0.20 AI PHT:            388 msec  AORTA Ao Root diam: 4.00 cm Ao Asc diam:  2.90 cm MITRAL VALVE                 TRICUSPID VALVE MV Area (PHT): 2.55 cm     TR Peak grad:   42.8 mmHg MV Area VTI:   1.18 cm     TR Vmax:        327.00 cm/s MV Peak grad:  10.8 mmHg MV Mean grad:  4.3 mmHg     SHUNTS MV Vmax:       1.64 m/s     Systemic VTI:  0.18 m MV Vmean:      90.2 cm/s    Systemic Diam: 2.18 cm MV VTI:        0.56 m MV Decel Time: 298 msec MR Peak grad: 109.0 mmHg MR Mean grad: 69.0 mmHg MR Vmax:      522.00 cm/s MR Vmean:     392.0 cm/s MV E velocity: 172.00 cm/s MV A velocity: 91.90 cm/s MV E/A ratio:  1.87 Eleonore Chiquito MD Electronically signed by Eleonore Chiquito MD Signature Date/Time: 09/27/2020/2:34:40 PM    Final       Subjective: Patient is feeling better, no dyspnea or chest pain, no nausea or vomiting,   Discharge Exam: Vitals:   09/28/20 2037 09/29/20 0443  BP: (!) 143/26 (!) 118/18  Pulse: (!) 58 61  Resp: 16 16  Temp: 98.1 F (36.7 C) 97.8 F (36.6 C)  SpO2: 95% 94%   Vitals:   09/28/20 1207 09/28/20 1606 09/28/20 2037 09/29/20 0443  BP: (!) 94/42 (!) 151/25 (!) 143/26 (!) 118/18  Pulse: Marland Kitchen)  59 (!) 58 (!) 58 61  Resp: 18 18 16 16   Temp: 97.7 F (36.5 C) 99.5 F (37.5 C) 98.1 F (36.7 C) 97.8 F (36.6 C)  TempSrc: Oral Oral Oral Oral  SpO2: 95% 93% 95% 94%  Weight:    73.5 kg  Height:        General: Not in pain or dyspnea.  Neurology: Awake and alert, non focal  E ENT: no pallor, no icterus, oral mucosa moist Cardiovascular: No JVD. S1-S2 present, rhythmic, positive murmur diastolic at the apex and systolic at the base, 6-1/9. No lower extremity edema. Pulmonary: positive breath sounds bilaterally, adequate air movement, no wheezing, rhonchi or rales. Gastrointestinal. Abdomen soft and non tender Skin. No rashes Musculoskeletal: no joint deformities   The results of significant diagnostics from this hospitalization (including imaging, microbiology, ancillary and laboratory) are listed below for reference.     Microbiology: Recent Results (from the past 240  hour(s))  SARS CORONAVIRUS 2 (TAT 6-24 HRS) Nasopharyngeal Nasopharyngeal Swab     Status: None   Collection Time: 09/26/20 10:02 PM   Specimen: Nasopharyngeal Swab  Result Value Ref Range Status   SARS Coronavirus 2 NEGATIVE NEGATIVE Final    Comment: (NOTE) SARS-CoV-2 target nucleic acids are NOT DETECTED.  The SARS-CoV-2 RNA is generally detectable in upper and lower respiratory specimens during the acute phase of infection. Negative results do not preclude SARS-CoV-2 infection, do not rule out co-infections with other pathogens, and should not be used as the sole basis for treatment or other patient management decisions. Negative results must be combined with clinical observations, patient history, and epidemiological information. The expected result is Negative.  Fact Sheet for Patients: SugarRoll.be  Fact Sheet for Healthcare Providers: https://www.woods-mathews.com/  This test is not yet approved or cleared by the [place] FDA and  has been authorized for detection and/or diagnosis of SARS-CoV-2 by FDA under an Emergency Use Authorization (EUA). This EUA will remain  in effect (meaning this test can be used) for the duration of the COVID-19 declaration under Se ction 564(b)(1) of the Act, 21 U.S.C. section 360bbb-3(b)(1), unless the authorization is terminated or revoked sooner.  Performed at [facility], [place] [address] [place],  [address]      Labs: BNP (last 3 results) Recent Labs    09/26/20 1655  BNP >6,712.4*   Basic Metabolic Panel: Recent Labs  Lab 09/26/20 1339 09/27/20 0331 09/28/20 0326 09/29/20 0308  NA 142 142 135 137  K 3.9 4.0 3.3* 3.6  CL 100 99 96* 98  CO2 30 31 30 29   GLUCOSE 100* 92 84 92  BUN 30* 35* 21 20  CREATININE 8.43* 9.35* 5.28* 4.58*  CALCIUM 9.0 8.8* 8.3* 9.0   Liver Function Tests: Recent Labs  Lab 09/26/20 1339  AST 26  ALT 18  ALKPHOS 111  BILITOT 0.7   PROT 6.6  ALBUMIN 3.0*   No results for input(s): LIPASE, AMYLASE in the last 168 hours. No results for input(s): AMMONIA in the last 168 hours. CBC: Recent Labs  Lab 09/26/20 1339 09/28/20 0738  WBC 11.9* 7.4  NEUTROABS 10.0*  --   HGB 11.7* 10.5*  HCT 37.5* 33.5*  MCV 100.5* 100.3*  PLT 229 217   Cardiac Enzymes: No results for input(s): CKTOTAL, CKMB, CKMBINDEX, TROPONINI in the last 168 hours. BNP: Invalid input(s): POCBNP CBG: Recent Labs  Lab 09/26/20 2239  GLUCAP 123*   D-Dimer No results for input(s): DDIMER in the last 72  hours. Hgb A1c No results for input(s): HGBA1C in the last 72 hours. Lipid Profile No results for input(s): CHOL, HDL, LDLCALC, TRIG, CHOLHDL, LDLDIRECT in the last 72 hours. Thyroid function studies No results for input(s): TSH, T4TOTAL, T3FREE, THYROIDAB in the last 72 hours.  Invalid input(s): FREET3 Anemia work up No results for input(s): VITAMINB12, FOLATE, FERRITIN, TIBC, IRON, RETICCTPCT in the last 72 hours. Urinalysis    Component Value Date/Time   COLORURINE YELLOW 06/05/2016 0328   APPEARANCEUR CLEAR 06/05/2016 0328   LABSPEC 1.010 06/05/2016 0328   PHURINE 5.0 06/05/2016 0328   GLUCOSEU NEGATIVE 06/05/2016 0328   HGBUR TRACE (A) 06/05/2016 0328   BILIRUBINUR NEGATIVE 06/05/2016 0328   KETONESUR NEGATIVE 06/05/2016 0328   PROTEINUR NEGATIVE 06/05/2016 0328   UROBILINOGEN 0.2 02/15/2010 0845   NITRITE NEGATIVE 06/05/2016 0328   LEUKOCYTESUR NEGATIVE 06/05/2016 0328   Sepsis Labs Invalid input(s): PROCALCITONIN,  WBC,  LACTICIDVEN Microbiology Recent Results (from the past 240 hour(s))  SARS CORONAVIRUS 2 (TAT 6-24 HRS) Nasopharyngeal Nasopharyngeal Swab     Status: None   Collection Time: 09/26/20 10:02 PM   Specimen: Nasopharyngeal Swab  Result Value Ref Range Status   SARS Coronavirus 2 NEGATIVE NEGATIVE Final    Comment: (NOTE) SARS-CoV-2 target nucleic acids are NOT DETECTED.  The SARS-CoV-2 RNA is generally  detectable in upper and lower respiratory specimens during the acute phase of infection. Negative results do not preclude SARS-CoV-2 infection, do not rule out co-infections with other pathogens, and should not be used as the sole basis for treatment or other patient management decisions. Negative results must be combined with clinical observations, patient history, and epidemiological information. The expected result is Negative.  Fact Sheet for Patients: SugarRoll.be  Fact Sheet for Healthcare Providers: https://www.woods-mathews.com/  This test is not yet approved or cleared by the [place] FDA and  has been authorized for detection and/or diagnosis of SARS-CoV-2 by FDA under an Emergency Use Authorization (EUA). This EUA will remain  in effect (meaning this test can be used) for the duration of the COVID-19 declaration under Se ction 564(b)(1) of the Act, 21 U.S.C. section 360bbb-3(b)(1), unless the authorization is terminated or revoked sooner.  Performed at [facility], [place] [address] [place], [place] [address]      Time coordinating discharge: 45 minutes  SIGNED:   Tawni Millers, MD  [facility] 09/29/2020, 8:35 AM

## 2020-09-29 NOTE — TOC Transition Note (Signed)
Transition of Care [facility]) - CM/SW Discharge Note    Patient Details  Name: Troy [name] MRN: [id number] Date of Birth: 06/21/1941  Transition of Care [facility]) CM/SW Contact:  Zenon Mayo, RN Phone Number: 09/29/2020, 11:39 AM   Clinical Narrative:     Patient lives at home with wife and daughter, he has a motorized scooter, rollator,  ath home.  He also has a scale and bp cuff, he states he tries to eat a no salt diet but  Not all the time.  He goes to [place] in [place], his PCP is Dr. Garald Balding fax [phone].  CSW is [id number] [phone] ext [phone].  Patient would like NCM to contact daughter , he wants a [facility] for CHF.  He also has a aide with [facility] MWF 830- 11:30 , Tu, THu 8:30 - 12.  [facility] also has [facility] services also. NCM offered choice to daughter for [facility], she chose [facility] which is now [facility] with [place].  Awaiting call back from [place]. Per [place], she states she can take patient for [facility], [place]. Soc will begin 24 to 48 hrs post dc.    Final next level of care: [place] Barriers to Discharge: No Barriers Identified   Patient Goals and CMS Choice Patient states their goals for this hospitalization and ongoing recovery are:: live longer CMS Medicare.gov Compare Post Acute Care list provided to:: Patient Choice offered to / list presented to : Patient  Discharge Placement                       Discharge Plan and Services In-house Referral: NA Discharge Planning Services: CM Consult Post Acute Care Choice: Home Health            DME Agency: NA       HH Arranged: RN,PT [place] Agency: [facility] (formerly [employer]) Date [place]: 09/29/20 Time [place]: 1139 Representative spoke with at [place]: [place]  Social Determinants of Health ([place]) Interventions     Readmission Risk Interventions Readmission Risk Prevention Plan 09/29/2020  Transportation  Screening Complete  PCP or Specialist Appt within 5-7 Days Complete  Home Care Screening Complete  Medication Review (RN CM) Complete  Some recent data might be hidden

## 2020-09-30 ENCOUNTER — Telehealth: Payer: Self-pay | Admitting: Nephrology

## 2020-09-30 DIAGNOSIS — Z992 Dependence on renal dialysis: Secondary | ICD-10-CM | POA: Diagnosis not present

## 2020-09-30 DIAGNOSIS — N2581 Secondary hyperparathyroidism of renal origin: Secondary | ICD-10-CM | POA: Diagnosis not present

## 2020-09-30 DIAGNOSIS — N186 End stage renal disease: Secondary | ICD-10-CM | POA: Diagnosis not present

## 2020-09-30 DIAGNOSIS — D688 Other specified coagulation defects: Secondary | ICD-10-CM | POA: Diagnosis not present

## 2020-09-30 DIAGNOSIS — R52 Pain, unspecified: Secondary | ICD-10-CM | POA: Diagnosis not present

## 2020-09-30 NOTE — Telephone Encounter (Signed)
Transition of care contact from inpatient facility  Date of Discharge: 09/29/20 Date of Contact: 09/30/20 Method of contact: Phone -attempt   Attempted to contact patient to discuss transition of care from inpatient admission. Patient did not answer the phone. Unable to leave VM.

## 2020-10-03 DIAGNOSIS — D688 Other specified coagulation defects: Secondary | ICD-10-CM | POA: Diagnosis not present

## 2020-10-03 DIAGNOSIS — N2581 Secondary hyperparathyroidism of renal origin: Secondary | ICD-10-CM | POA: Diagnosis not present

## 2020-10-03 DIAGNOSIS — R52 Pain, unspecified: Secondary | ICD-10-CM | POA: Diagnosis not present

## 2020-10-03 DIAGNOSIS — N186 End stage renal disease: Secondary | ICD-10-CM | POA: Diagnosis not present

## 2020-10-03 DIAGNOSIS — Z992 Dependence on renal dialysis: Secondary | ICD-10-CM | POA: Diagnosis not present

## 2020-10-05 DIAGNOSIS — Z992 Dependence on renal dialysis: Secondary | ICD-10-CM | POA: Diagnosis not present

## 2020-10-05 DIAGNOSIS — D688 Other specified coagulation defects: Secondary | ICD-10-CM | POA: Diagnosis not present

## 2020-10-05 DIAGNOSIS — N186 End stage renal disease: Secondary | ICD-10-CM | POA: Diagnosis not present

## 2020-10-05 DIAGNOSIS — R52 Pain, unspecified: Secondary | ICD-10-CM | POA: Diagnosis not present

## 2020-10-05 DIAGNOSIS — N2581 Secondary hyperparathyroidism of renal origin: Secondary | ICD-10-CM | POA: Diagnosis not present

## 2020-10-06 ENCOUNTER — Other Ambulatory Visit: Payer: Medicare Other | Admitting: Hospice

## 2020-10-06 ENCOUNTER — Other Ambulatory Visit: Payer: Self-pay

## 2020-10-06 DIAGNOSIS — I129 Hypertensive chronic kidney disease with stage 1 through stage 4 chronic kidney disease, or unspecified chronic kidney disease: Secondary | ICD-10-CM | POA: Diagnosis not present

## 2020-10-06 DIAGNOSIS — I080 Rheumatic disorders of both mitral and aortic valves: Secondary | ICD-10-CM | POA: Diagnosis not present

## 2020-10-06 DIAGNOSIS — I255 Ischemic cardiomyopathy: Secondary | ICD-10-CM | POA: Diagnosis not present

## 2020-10-06 DIAGNOSIS — R0609 Other forms of dyspnea: Secondary | ICD-10-CM

## 2020-10-06 DIAGNOSIS — I5032 Chronic diastolic (congestive) heart failure: Secondary | ICD-10-CM

## 2020-10-06 DIAGNOSIS — R0600 Dyspnea, unspecified: Secondary | ICD-10-CM

## 2020-10-06 DIAGNOSIS — Z515 Encounter for palliative care: Secondary | ICD-10-CM | POA: Diagnosis not present

## 2020-10-06 DIAGNOSIS — H903 Sensorineural hearing loss, bilateral: Secondary | ICD-10-CM | POA: Diagnosis not present

## 2020-10-06 DIAGNOSIS — I480 Paroxysmal atrial fibrillation: Secondary | ICD-10-CM | POA: Diagnosis not present

## 2020-10-06 DIAGNOSIS — D631 Anemia in chronic kidney disease: Secondary | ICD-10-CM | POA: Diagnosis not present

## 2020-10-06 DIAGNOSIS — I2510 Atherosclerotic heart disease of native coronary artery without angina pectoris: Secondary | ICD-10-CM | POA: Diagnosis not present

## 2020-10-06 DIAGNOSIS — I132 Hypertensive heart and chronic kidney disease with heart failure and with stage 5 chronic kidney disease, or end stage renal disease: Secondary | ICD-10-CM | POA: Diagnosis not present

## 2020-10-06 DIAGNOSIS — Z992 Dependence on renal dialysis: Secondary | ICD-10-CM | POA: Diagnosis not present

## 2020-10-06 DIAGNOSIS — N186 End stage renal disease: Secondary | ICD-10-CM | POA: Diagnosis not present

## 2020-10-06 DIAGNOSIS — I5023 Acute on chronic systolic (congestive) heart failure: Secondary | ICD-10-CM | POA: Diagnosis not present

## 2020-10-06 DIAGNOSIS — E782 Mixed hyperlipidemia: Secondary | ICD-10-CM | POA: Diagnosis not present
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
## 2020-10-07 DIAGNOSIS — N2581 Secondary hyperparathyroidism of renal origin: Secondary | ICD-10-CM | POA: Diagnosis not present

## 2020-10-07 DIAGNOSIS — D688 Other specified coagulation defects: Secondary | ICD-10-CM | POA: Diagnosis not present

## 2020-10-07 DIAGNOSIS — N186 End stage renal disease: Secondary | ICD-10-CM | POA: Diagnosis not present

## 2020-10-07 DIAGNOSIS — Z992 Dependence on renal dialysis: Secondary | ICD-10-CM | POA: Diagnosis not present

## 2020-10-10 DIAGNOSIS — R52 Pain, unspecified: Secondary | ICD-10-CM | POA: Diagnosis not present

## 2020-10-10 DIAGNOSIS — N2581 Secondary hyperparathyroidism of renal origin: Secondary | ICD-10-CM | POA: Diagnosis not present

## 2020-10-10 DIAGNOSIS — Z992 Dependence on renal dialysis: Secondary | ICD-10-CM | POA: Diagnosis not present

## 2020-10-10 DIAGNOSIS — N186 End stage renal disease: Secondary | ICD-10-CM | POA: Diagnosis not present

## 2020-10-10 DIAGNOSIS — D688 Other specified coagulation defects: Secondary | ICD-10-CM | POA: Diagnosis not present

## 2020-10-12 DIAGNOSIS — D688 Other specified coagulation defects: Secondary | ICD-10-CM | POA: Diagnosis not present

## 2020-10-12 DIAGNOSIS — N2581 Secondary hyperparathyroidism of renal origin: Secondary | ICD-10-CM | POA: Diagnosis not present

## 2020-10-12 DIAGNOSIS — Z992 Dependence on renal dialysis: Secondary | ICD-10-CM | POA: Diagnosis not present

## 2020-10-12 DIAGNOSIS — R52 Pain, unspecified: Secondary | ICD-10-CM | POA: Diagnosis not present

## 2020-10-12 DIAGNOSIS — N186 End stage renal disease: Secondary | ICD-10-CM | POA: Diagnosis not present

## 2020-10-14 DIAGNOSIS — R52 Pain, unspecified: Secondary | ICD-10-CM | POA: Diagnosis not present

## 2020-10-14 DIAGNOSIS — N2581 Secondary hyperparathyroidism of renal origin: Secondary | ICD-10-CM | POA: Diagnosis not present

## 2020-10-14 DIAGNOSIS — D688 Other specified coagulation defects: Secondary | ICD-10-CM | POA: Diagnosis not present

## 2020-10-14 DIAGNOSIS — N186 End stage renal disease: Secondary | ICD-10-CM | POA: Diagnosis not present

## 2020-10-14 DIAGNOSIS — Z992 Dependence on renal dialysis: Secondary | ICD-10-CM | POA: Diagnosis not present

## 2020-10-17 DIAGNOSIS — D688 Other specified coagulation defects: Secondary | ICD-10-CM | POA: Diagnosis not present

## 2020-10-17 DIAGNOSIS — Z992 Dependence on renal dialysis: Secondary | ICD-10-CM | POA: Diagnosis not present

## 2020-10-17 DIAGNOSIS — N186 End stage renal disease: Secondary | ICD-10-CM | POA: Diagnosis not present

## 2020-10-17 DIAGNOSIS — N2581 Secondary hyperparathyroidism of renal origin: Secondary | ICD-10-CM | POA: Diagnosis not present

## 2020-10-19 ENCOUNTER — Other Ambulatory Visit: Payer: Medicare Other | Admitting: Hospice

## 2020-10-19 ENCOUNTER — Other Ambulatory Visit: Payer: Self-pay

## 2020-10-19 DIAGNOSIS — N2581 Secondary hyperparathyroidism of renal origin: Secondary | ICD-10-CM | POA: Diagnosis not present

## 2020-10-19 DIAGNOSIS — N186 End stage renal disease: Secondary | ICD-10-CM | POA: Diagnosis not present

## 2020-10-19 DIAGNOSIS — D688 Other specified coagulation defects: Secondary | ICD-10-CM | POA: Diagnosis not present

## 2020-10-19 DIAGNOSIS — Z992 Dependence on renal dialysis: Secondary | ICD-10-CM | POA: Diagnosis not present

## 2020-10-21 DIAGNOSIS — N186 End stage renal disease: Secondary | ICD-10-CM | POA: Diagnosis not present

## 2020-10-21 DIAGNOSIS — N2581 Secondary hyperparathyroidism of renal origin: Secondary | ICD-10-CM | POA: Diagnosis not present

## 2020-10-21 DIAGNOSIS — Z992 Dependence on renal dialysis: Secondary | ICD-10-CM | POA: Diagnosis not present

## 2020-10-21 DIAGNOSIS — D688 Other specified coagulation defects: Secondary | ICD-10-CM | POA: Diagnosis not present

## 2020-10-24 DIAGNOSIS — R52 Pain, unspecified: Secondary | ICD-10-CM | POA: Diagnosis not present

## 2020-10-24 DIAGNOSIS — N2581 Secondary hyperparathyroidism of renal origin: Secondary | ICD-10-CM | POA: Diagnosis not present

## 2020-10-24 DIAGNOSIS — Z992 Dependence on renal dialysis: Secondary | ICD-10-CM | POA: Diagnosis not present

## 2020-10-24 DIAGNOSIS — D688 Other specified coagulation defects: Secondary | ICD-10-CM | POA: Diagnosis not present

## 2020-10-24 DIAGNOSIS — N186 End stage renal disease: Secondary | ICD-10-CM | POA: Diagnosis not present

## 2020-10-26 DIAGNOSIS — Z992 Dependence on renal dialysis: Secondary | ICD-10-CM | POA: Diagnosis not present

## 2020-10-26 DIAGNOSIS — N2581 Secondary hyperparathyroidism of renal origin: Secondary | ICD-10-CM | POA: Diagnosis not present

## 2020-10-26 DIAGNOSIS — N186 End stage renal disease: Secondary | ICD-10-CM | POA: Diagnosis not present

## 2020-10-26 DIAGNOSIS — R52 Pain, unspecified: Secondary | ICD-10-CM | POA: Diagnosis not present

## 2020-10-26 DIAGNOSIS — D688 Other specified coagulation defects: Secondary | ICD-10-CM | POA: Diagnosis not present

## 2020-10-28 DIAGNOSIS — N2581 Secondary hyperparathyroidism of renal origin: Secondary | ICD-10-CM | POA: Diagnosis not present

## 2020-10-28 DIAGNOSIS — N186 End stage renal disease: Secondary | ICD-10-CM | POA: Diagnosis not present

## 2020-10-28 DIAGNOSIS — R52 Pain, unspecified: Secondary | ICD-10-CM | POA: Diagnosis not present

## 2020-10-28 DIAGNOSIS — D688 Other specified coagulation defects: Secondary | ICD-10-CM | POA: Diagnosis not present

## 2020-10-28 DIAGNOSIS — Z992 Dependence on renal dialysis: Secondary | ICD-10-CM | POA: Diagnosis not present

## 2020-10-31 DIAGNOSIS — N186 End stage renal disease: Secondary | ICD-10-CM | POA: Diagnosis not present

## 2020-10-31 DIAGNOSIS — Z992 Dependence on renal dialysis: Secondary | ICD-10-CM | POA: Diagnosis not present

## 2020-10-31 DIAGNOSIS — D688 Other specified coagulation defects: Secondary | ICD-10-CM | POA: Diagnosis not present

## 2020-10-31 DIAGNOSIS — N2581 Secondary hyperparathyroidism of renal origin: Secondary | ICD-10-CM | POA: Diagnosis not present

## 2020-11-01 DIAGNOSIS — N186 End stage renal disease: Secondary | ICD-10-CM | POA: Diagnosis not present

## 2020-11-01 DIAGNOSIS — Z992 Dependence on renal dialysis: Secondary | ICD-10-CM | POA: Diagnosis not present

## 2020-11-01 DIAGNOSIS — I871 Compression of vein: Secondary | ICD-10-CM | POA: Diagnosis not present

## 2020-11-02 DIAGNOSIS — N186 End stage renal disease: Secondary | ICD-10-CM | POA: Diagnosis not present

## 2020-11-02 DIAGNOSIS — D688 Other specified coagulation defects: Secondary | ICD-10-CM | POA: Diagnosis not present

## 2020-11-02 DIAGNOSIS — N2581 Secondary hyperparathyroidism of renal origin: Secondary | ICD-10-CM | POA: Diagnosis not present

## 2020-11-02 DIAGNOSIS — Z992 Dependence on renal dialysis: Secondary | ICD-10-CM | POA: Diagnosis not present

## 2020-11-04 DIAGNOSIS — D688 Other specified coagulation defects: Secondary | ICD-10-CM | POA: Diagnosis not present

## 2020-11-04 DIAGNOSIS — Z992 Dependence on renal dialysis: Secondary | ICD-10-CM | POA: Diagnosis not present

## 2020-11-04 DIAGNOSIS — N2581 Secondary hyperparathyroidism of renal origin: Secondary | ICD-10-CM | POA: Diagnosis not present

## 2020-11-04 DIAGNOSIS — N186 End stage renal disease: Secondary | ICD-10-CM | POA: Diagnosis not present

## 2020-11-05 DIAGNOSIS — Z992 Dependence on renal dialysis: Secondary | ICD-10-CM | POA: Diagnosis not present

## 2020-11-05 DIAGNOSIS — N186 End stage renal disease: Secondary | ICD-10-CM | POA: Diagnosis not present

## 2020-11-05 DIAGNOSIS — I129 Hypertensive chronic kidney disease with stage 1 through stage 4 chronic kidney disease, or unspecified chronic kidney disease: Secondary | ICD-10-CM | POA: Diagnosis not present

## 2020-11-07 DIAGNOSIS — D688 Other specified coagulation defects: Secondary | ICD-10-CM | POA: Diagnosis not present

## 2020-11-07 DIAGNOSIS — Z992 Dependence on renal dialysis: Secondary | ICD-10-CM | POA: Diagnosis not present

## 2020-11-07 DIAGNOSIS — N186 End stage renal disease: Secondary | ICD-10-CM | POA: Diagnosis not present

## 2020-11-07 DIAGNOSIS — N2581 Secondary hyperparathyroidism of renal origin: Secondary | ICD-10-CM | POA: Diagnosis not present

## 2020-11-09 DIAGNOSIS — D688 Other specified coagulation defects: Secondary | ICD-10-CM | POA: Diagnosis not present

## 2020-11-09 DIAGNOSIS — N2581 Secondary hyperparathyroidism of renal origin: Secondary | ICD-10-CM | POA: Diagnosis not present

## 2020-11-09 DIAGNOSIS — Z992 Dependence on renal dialysis: Secondary | ICD-10-CM | POA: Diagnosis not present

## 2020-11-09 DIAGNOSIS — N186 End stage renal disease: Secondary | ICD-10-CM | POA: Diagnosis not present

## 2020-11-11 DIAGNOSIS — Z992 Dependence on renal dialysis: Secondary | ICD-10-CM | POA: Diagnosis not present

## 2020-11-11 DIAGNOSIS — N2581 Secondary hyperparathyroidism of renal origin: Secondary | ICD-10-CM | POA: Diagnosis not present

## 2020-11-11 DIAGNOSIS — D688 Other specified coagulation defects: Secondary | ICD-10-CM | POA: Diagnosis not present

## 2020-11-11 DIAGNOSIS — N186 End stage renal disease: Secondary | ICD-10-CM | POA: Diagnosis not present

## 2020-11-14 DIAGNOSIS — N2581 Secondary hyperparathyroidism of renal origin: Secondary | ICD-10-CM | POA: Diagnosis not present

## 2020-11-14 DIAGNOSIS — Z992 Dependence on renal dialysis: Secondary | ICD-10-CM | POA: Diagnosis not present

## 2020-11-14 DIAGNOSIS — D688 Other specified coagulation defects: Secondary | ICD-10-CM | POA: Diagnosis not present

## 2020-11-14 DIAGNOSIS — N186 End stage renal disease: Secondary | ICD-10-CM | POA: Diagnosis not present

## 2020-11-16 DIAGNOSIS — Z992 Dependence on renal dialysis: Secondary | ICD-10-CM | POA: Diagnosis not present

## 2020-11-16 DIAGNOSIS — N186 End stage renal disease: Secondary | ICD-10-CM | POA: Diagnosis not present

## 2020-11-16 DIAGNOSIS — D688 Other specified coagulation defects: Secondary | ICD-10-CM | POA: Diagnosis not present

## 2020-11-16 DIAGNOSIS — N2581 Secondary hyperparathyroidism of renal origin: Secondary | ICD-10-CM | POA: Diagnosis not present

## 2020-11-18 DIAGNOSIS — N186 End stage renal disease: Secondary | ICD-10-CM | POA: Diagnosis not present

## 2020-11-18 DIAGNOSIS — Z992 Dependence on renal dialysis: Secondary | ICD-10-CM | POA: Diagnosis not present

## 2020-11-18 DIAGNOSIS — D688 Other specified coagulation defects: Secondary | ICD-10-CM | POA: Diagnosis not present

## 2020-11-18 DIAGNOSIS — N2581 Secondary hyperparathyroidism of renal origin: Secondary | ICD-10-CM | POA: Diagnosis not present

## 2020-11-21 DIAGNOSIS — N186 End stage renal disease: Secondary | ICD-10-CM | POA: Diagnosis not present

## 2020-11-21 DIAGNOSIS — N2581 Secondary hyperparathyroidism of renal origin: Secondary | ICD-10-CM | POA: Diagnosis not present

## 2020-11-21 DIAGNOSIS — R52 Pain, unspecified: Secondary | ICD-10-CM | POA: Diagnosis not present

## 2020-11-21 DIAGNOSIS — Z992 Dependence on renal dialysis: Secondary | ICD-10-CM | POA: Diagnosis not present

## 2020-11-21 DIAGNOSIS — D688 Other specified coagulation defects: Secondary | ICD-10-CM | POA: Diagnosis not present

## 2020-11-23 DIAGNOSIS — Z992 Dependence on renal dialysis: Secondary | ICD-10-CM | POA: Diagnosis not present

## 2020-11-23 DIAGNOSIS — N2581 Secondary hyperparathyroidism of renal origin: Secondary | ICD-10-CM | POA: Diagnosis not present

## 2020-11-23 DIAGNOSIS — N186 End stage renal disease: Secondary | ICD-10-CM | POA: Diagnosis not present

## 2020-11-23 DIAGNOSIS — D688 Other specified coagulation defects: Secondary | ICD-10-CM | POA: Diagnosis not present

## 2020-11-23 DIAGNOSIS — R52 Pain, unspecified: Secondary | ICD-10-CM | POA: Diagnosis not present

## 2020-11-25 DIAGNOSIS — D688 Other specified coagulation defects: Secondary | ICD-10-CM | POA: Diagnosis not present

## 2020-11-25 DIAGNOSIS — R52 Pain, unspecified: Secondary | ICD-10-CM | POA: Diagnosis not present

## 2020-11-25 DIAGNOSIS — N186 End stage renal disease: Secondary | ICD-10-CM | POA: Diagnosis not present

## 2020-11-25 DIAGNOSIS — N2581 Secondary hyperparathyroidism of renal origin: Secondary | ICD-10-CM | POA: Diagnosis not present

## 2020-11-25 DIAGNOSIS — Z992 Dependence on renal dialysis: Secondary | ICD-10-CM | POA: Diagnosis not present

## 2020-11-28 DIAGNOSIS — N186 End stage renal disease: Secondary | ICD-10-CM | POA: Diagnosis not present

## 2020-11-28 DIAGNOSIS — N2581 Secondary hyperparathyroidism of renal origin: Secondary | ICD-10-CM | POA: Diagnosis not present

## 2020-11-28 DIAGNOSIS — D688 Other specified coagulation defects: Secondary | ICD-10-CM | POA: Diagnosis not present

## 2020-11-28 DIAGNOSIS — E875 Hyperkalemia: Secondary | ICD-10-CM | POA: Diagnosis not present

## 2020-11-28 DIAGNOSIS — Z992 Dependence on renal dialysis: Secondary | ICD-10-CM | POA: Diagnosis not present

## 2020-11-30 DIAGNOSIS — N2581 Secondary hyperparathyroidism of renal origin: Secondary | ICD-10-CM | POA: Diagnosis not present

## 2020-11-30 DIAGNOSIS — Z992 Dependence on renal dialysis: Secondary | ICD-10-CM | POA: Diagnosis not present

## 2020-11-30 DIAGNOSIS — D688 Other specified coagulation defects: Secondary | ICD-10-CM | POA: Diagnosis not present

## 2020-11-30 DIAGNOSIS — N186 End stage renal disease: Secondary | ICD-10-CM | POA: Diagnosis not present

## 2020-11-30 DIAGNOSIS — E875 Hyperkalemia: Secondary | ICD-10-CM | POA: Diagnosis not present

## 2020-12-02 DIAGNOSIS — N2581 Secondary hyperparathyroidism of renal origin: Secondary | ICD-10-CM | POA: Diagnosis not present

## 2020-12-02 DIAGNOSIS — D688 Other specified coagulation defects: Secondary | ICD-10-CM | POA: Diagnosis not present

## 2020-12-02 DIAGNOSIS — E875 Hyperkalemia: Secondary | ICD-10-CM | POA: Diagnosis not present

## 2020-12-02 DIAGNOSIS — N186 End stage renal disease: Secondary | ICD-10-CM | POA: Diagnosis not present

## 2020-12-02 DIAGNOSIS — Z992 Dependence on renal dialysis: Secondary | ICD-10-CM | POA: Diagnosis not present

## 2020-12-05 DIAGNOSIS — D688 Other specified coagulation defects: Secondary | ICD-10-CM | POA: Diagnosis not present

## 2020-12-05 DIAGNOSIS — Z992 Dependence on renal dialysis: Secondary | ICD-10-CM | POA: Diagnosis not present

## 2020-12-05 DIAGNOSIS — N186 End stage renal disease: Secondary | ICD-10-CM | POA: Diagnosis not present

## 2020-12-05 DIAGNOSIS — N2581 Secondary hyperparathyroidism of renal origin: Secondary | ICD-10-CM | POA: Diagnosis not present

## 2020-12-06 DIAGNOSIS — Z992 Dependence on renal dialysis: Secondary | ICD-10-CM | POA: Diagnosis not present

## 2020-12-06 DIAGNOSIS — I129 Hypertensive chronic kidney disease with stage 1 through stage 4 chronic kidney disease, or unspecified chronic kidney disease: Secondary | ICD-10-CM | POA: Diagnosis not present

## 2020-12-06 DIAGNOSIS — N186 End stage renal disease: Secondary | ICD-10-CM | POA: Diagnosis not present

## 2020-12-07 DIAGNOSIS — D688 Other specified coagulation defects: Secondary | ICD-10-CM | POA: Diagnosis not present

## 2020-12-07 DIAGNOSIS — Z992 Dependence on renal dialysis: Secondary | ICD-10-CM | POA: Diagnosis not present

## 2020-12-07 DIAGNOSIS — N186 End stage renal disease: Secondary | ICD-10-CM | POA: Diagnosis not present

## 2020-12-07 DIAGNOSIS — R52 Pain, unspecified: Secondary | ICD-10-CM | POA: Diagnosis not present

## 2020-12-07 DIAGNOSIS — N2581 Secondary hyperparathyroidism of renal origin: Secondary | ICD-10-CM | POA: Diagnosis not present

## 2020-12-09 ENCOUNTER — Other Ambulatory Visit: Payer: Medicare Other | Admitting: Hospice

## 2020-12-09 ENCOUNTER — Other Ambulatory Visit: Payer: Self-pay

## 2020-12-09 DIAGNOSIS — R0600 Dyspnea, unspecified: Secondary | ICD-10-CM | POA: Diagnosis not present

## 2020-12-09 DIAGNOSIS — Z992 Dependence on renal dialysis: Secondary | ICD-10-CM | POA: Diagnosis not present

## 2020-12-09 DIAGNOSIS — D688 Other specified coagulation defects: Secondary | ICD-10-CM | POA: Diagnosis not present

## 2020-12-09 DIAGNOSIS — N186 End stage renal disease: Secondary | ICD-10-CM | POA: Diagnosis not present

## 2020-12-09 DIAGNOSIS — R0609 Other forms of dyspnea: Secondary | ICD-10-CM

## 2020-12-09 DIAGNOSIS — Z515 Encounter for palliative care: Secondary | ICD-10-CM

## 2020-12-09 DIAGNOSIS — R52 Pain, unspecified: Secondary | ICD-10-CM | POA: Diagnosis not present

## 2020-12-09 DIAGNOSIS — N2581 Secondary hyperparathyroidism of renal origin: Secondary | ICD-10-CM | POA: Diagnosis not present
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
## 2020-12-12 DIAGNOSIS — N2581 Secondary hyperparathyroidism of renal origin: Secondary | ICD-10-CM | POA: Diagnosis not present

## 2020-12-12 DIAGNOSIS — Z992 Dependence on renal dialysis: Secondary | ICD-10-CM | POA: Diagnosis not present

## 2020-12-12 DIAGNOSIS — D688 Other specified coagulation defects: Secondary | ICD-10-CM | POA: Diagnosis not present

## 2020-12-12 DIAGNOSIS — N186 End stage renal disease: Secondary | ICD-10-CM | POA: Diagnosis not present

## 2020-12-13 DIAGNOSIS — R55 Syncope and collapse: Secondary | ICD-10-CM | POA: Diagnosis not present

## 2020-12-14 DIAGNOSIS — N186 End stage renal disease: Secondary | ICD-10-CM | POA: Diagnosis not present

## 2020-12-14 DIAGNOSIS — Z992 Dependence on renal dialysis: Secondary | ICD-10-CM | POA: Diagnosis not present

## 2020-12-14 DIAGNOSIS — D688 Other specified coagulation defects: Secondary | ICD-10-CM | POA: Diagnosis not present

## 2020-12-14 DIAGNOSIS — N2581 Secondary hyperparathyroidism of renal origin: Secondary | ICD-10-CM | POA: Diagnosis not present

## 2020-12-16 DIAGNOSIS — Z992 Dependence on renal dialysis: Secondary | ICD-10-CM | POA: Diagnosis not present

## 2020-12-16 DIAGNOSIS — N2581 Secondary hyperparathyroidism of renal origin: Secondary | ICD-10-CM | POA: Diagnosis not present

## 2020-12-16 DIAGNOSIS — N186 End stage renal disease: Secondary | ICD-10-CM | POA: Diagnosis not present

## 2020-12-16 DIAGNOSIS — D688 Other specified coagulation defects: Secondary | ICD-10-CM | POA: Diagnosis not present

## 2020-12-19 DIAGNOSIS — N186 End stage renal disease: Secondary | ICD-10-CM | POA: Diagnosis not present

## 2020-12-19 DIAGNOSIS — D631 Anemia in chronic kidney disease: Secondary | ICD-10-CM | POA: Diagnosis not present

## 2020-12-19 DIAGNOSIS — D688 Other specified coagulation defects: Secondary | ICD-10-CM | POA: Diagnosis not present

## 2020-12-19 DIAGNOSIS — N2581 Secondary hyperparathyroidism of renal origin: Secondary | ICD-10-CM | POA: Diagnosis not present

## 2020-12-19 DIAGNOSIS — Z992 Dependence on renal dialysis: Secondary | ICD-10-CM | POA: Diagnosis not present

## 2020-12-21 DIAGNOSIS — Z992 Dependence on renal dialysis: Secondary | ICD-10-CM | POA: Diagnosis not present

## 2020-12-21 DIAGNOSIS — D631 Anemia in chronic kidney disease: Secondary | ICD-10-CM | POA: Diagnosis not present

## 2020-12-21 DIAGNOSIS — D688 Other specified coagulation defects: Secondary | ICD-10-CM | POA: Diagnosis not present

## 2020-12-21 DIAGNOSIS — N186 End stage renal disease: Secondary | ICD-10-CM | POA: Diagnosis not present

## 2020-12-21 DIAGNOSIS — N2581 Secondary hyperparathyroidism of renal origin: Secondary | ICD-10-CM | POA: Diagnosis not present

## 2020-12-23 DIAGNOSIS — D631 Anemia in chronic kidney disease: Secondary | ICD-10-CM | POA: Diagnosis not present

## 2020-12-23 DIAGNOSIS — D688 Other specified coagulation defects: Secondary | ICD-10-CM | POA: Diagnosis not present

## 2020-12-23 DIAGNOSIS — N2581 Secondary hyperparathyroidism of renal origin: Secondary | ICD-10-CM | POA: Diagnosis not present

## 2020-12-23 DIAGNOSIS — Z992 Dependence on renal dialysis: Secondary | ICD-10-CM | POA: Diagnosis not present

## 2020-12-23 DIAGNOSIS — N186 End stage renal disease: Secondary | ICD-10-CM | POA: Diagnosis not present

## 2020-12-26 DIAGNOSIS — D631 Anemia in chronic kidney disease: Secondary | ICD-10-CM | POA: Diagnosis not present

## 2020-12-26 DIAGNOSIS — N2581 Secondary hyperparathyroidism of renal origin: Secondary | ICD-10-CM | POA: Diagnosis not present

## 2020-12-26 DIAGNOSIS — Z992 Dependence on renal dialysis: Secondary | ICD-10-CM | POA: Diagnosis not present

## 2020-12-26 DIAGNOSIS — D688 Other specified coagulation defects: Secondary | ICD-10-CM | POA: Diagnosis not present

## 2020-12-26 DIAGNOSIS — N186 End stage renal disease: Secondary | ICD-10-CM | POA: Diagnosis not present

## 2020-12-27 ENCOUNTER — Encounter (HOSPITAL_COMMUNITY): Payer: Self-pay | Admitting: *Deleted

## 2020-12-27 ENCOUNTER — Other Ambulatory Visit: Payer: Self-pay

## 2020-12-27 ENCOUNTER — Other Ambulatory Visit: Payer: Medicare Other | Admitting: Hospice

## 2020-12-27 DIAGNOSIS — Z515 Encounter for palliative care: Secondary | ICD-10-CM

## 2020-12-27 NOTE — Progress Notes (Signed)
NP called Troy [name] several times and also the home number on file for the call/visit she requested. Left her voicemails with call back number.

## 2020-12-28 ENCOUNTER — Other Ambulatory Visit: Payer: Self-pay | Admitting: Student

## 2020-12-28 ENCOUNTER — Other Ambulatory Visit: Payer: Medicare Other | Admitting: Hospice

## 2020-12-28 ENCOUNTER — Telehealth: Payer: Self-pay

## 2020-12-28 DIAGNOSIS — G8929 Other chronic pain: Secondary | ICD-10-CM | POA: Diagnosis not present

## 2020-12-28 DIAGNOSIS — N2581 Secondary hyperparathyroidism of renal origin: Secondary | ICD-10-CM | POA: Diagnosis not present

## 2020-12-28 DIAGNOSIS — Z515 Encounter for palliative care: Secondary | ICD-10-CM

## 2020-12-28 DIAGNOSIS — D631 Anemia in chronic kidney disease: Secondary | ICD-10-CM | POA: Diagnosis not present

## 2020-12-28 DIAGNOSIS — M545 Low back pain, unspecified: Secondary | ICD-10-CM

## 2020-12-28 DIAGNOSIS — N186 End stage renal disease: Secondary | ICD-10-CM | POA: Diagnosis not present

## 2020-12-28 DIAGNOSIS — F0391 Unspecified dementia with behavioral disturbance: Secondary | ICD-10-CM

## 2020-12-28 DIAGNOSIS — D688 Other specified coagulation defects: Secondary | ICD-10-CM | POA: Diagnosis not present

## 2020-12-28 DIAGNOSIS — Z992 Dependence on renal dialysis: Secondary | ICD-10-CM | POA: Diagnosis not present
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
## 2020-12-28 NOTE — Telephone Encounter (Signed)
BEREAVEMENT REFERRAL  Palliative care SW completed a bereavement referral for patient to [facility].

## 2020-12-30 DIAGNOSIS — Z992 Dependence on renal dialysis: Secondary | ICD-10-CM | POA: Diagnosis not present

## 2020-12-30 DIAGNOSIS — N2581 Secondary hyperparathyroidism of renal origin: Secondary | ICD-10-CM | POA: Diagnosis not present

## 2020-12-30 DIAGNOSIS — N186 End stage renal disease: Secondary | ICD-10-CM | POA: Diagnosis not present

## 2020-12-30 DIAGNOSIS — D688 Other specified coagulation defects: Secondary | ICD-10-CM | POA: Diagnosis not present

## 2020-12-30 DIAGNOSIS — D631 Anemia in chronic kidney disease: Secondary | ICD-10-CM | POA: Diagnosis not present

## 2020-12-31 ENCOUNTER — Other Ambulatory Visit: Payer: Self-pay | Admitting: Cardiology

## 2021-01-02 DIAGNOSIS — N2581 Secondary hyperparathyroidism of renal origin: Secondary | ICD-10-CM | POA: Diagnosis not present

## 2021-01-02 DIAGNOSIS — Z992 Dependence on renal dialysis: Secondary | ICD-10-CM | POA: Diagnosis not present

## 2021-01-02 DIAGNOSIS — N186 End stage renal disease: Secondary | ICD-10-CM | POA: Diagnosis not present

## 2021-01-02 DIAGNOSIS — D688 Other specified coagulation defects: Secondary | ICD-10-CM | POA: Diagnosis not present

## 2021-01-04 DIAGNOSIS — Z992 Dependence on renal dialysis: Secondary | ICD-10-CM | POA: Diagnosis not present

## 2021-01-04 DIAGNOSIS — N2581 Secondary hyperparathyroidism of renal origin: Secondary | ICD-10-CM | POA: Diagnosis not present

## 2021-01-04 DIAGNOSIS — D688 Other specified coagulation defects: Secondary | ICD-10-CM | POA: Diagnosis not present

## 2021-01-04 DIAGNOSIS — I129 Hypertensive chronic kidney disease with stage 1 through stage 4 chronic kidney disease, or unspecified chronic kidney disease: Secondary | ICD-10-CM | POA: Diagnosis not present

## 2021-01-04 DIAGNOSIS — N186 End stage renal disease: Secondary | ICD-10-CM | POA: Diagnosis not present

## 2021-01-05 ENCOUNTER — Other Ambulatory Visit: Payer: Self-pay

## 2021-01-05 ENCOUNTER — Ambulatory Visit (HOSPITAL_COMMUNITY)
Admission: RE | Admit: 2021-01-05 | Discharge: 2021-01-05 | Disposition: A | Discharge status: Home / Self Care | Payer: Medicare Other | Source: Ambulatory Visit | Attending: Cardiovascular Disease | Admitting: Cardiovascular Disease

## 2021-01-05 DIAGNOSIS — N186 End stage renal disease: Secondary | ICD-10-CM | POA: Diagnosis not present

## 2021-01-05 DIAGNOSIS — I129 Hypertensive chronic kidney disease with stage 1 through stage 4 chronic kidney disease, or unspecified chronic kidney disease: Secondary | ICD-10-CM | POA: Diagnosis not present

## 2021-01-05 DIAGNOSIS — Z992 Dependence on renal dialysis: Secondary | ICD-10-CM | POA: Diagnosis not present

## 2021-01-05 DIAGNOSIS — I6523 Occlusion and stenosis of bilateral carotid arteries: Secondary | ICD-10-CM

## 2021-01-06 DIAGNOSIS — E875 Hyperkalemia: Secondary | ICD-10-CM | POA: Diagnosis not present

## 2021-01-06 DIAGNOSIS — N186 End stage renal disease: Secondary | ICD-10-CM | POA: Diagnosis not present

## 2021-01-06 DIAGNOSIS — Z992 Dependence on renal dialysis: Secondary | ICD-10-CM | POA: Diagnosis not present

## 2021-01-06 DIAGNOSIS — D688 Other specified coagulation defects: Secondary | ICD-10-CM | POA: Diagnosis not present

## 2021-01-06 DIAGNOSIS — R52 Pain, unspecified: Secondary | ICD-10-CM | POA: Diagnosis not present

## 2021-01-06 DIAGNOSIS — N2581 Secondary hyperparathyroidism of renal origin: Secondary | ICD-10-CM | POA: Diagnosis not present

## 2021-01-09 DIAGNOSIS — N186 End stage renal disease: Secondary | ICD-10-CM | POA: Diagnosis not present

## 2021-01-09 DIAGNOSIS — D688 Other specified coagulation defects: Secondary | ICD-10-CM | POA: Diagnosis not present

## 2021-01-09 DIAGNOSIS — Z992 Dependence on renal dialysis: Secondary | ICD-10-CM | POA: Diagnosis not present

## 2021-01-09 DIAGNOSIS — N2581 Secondary hyperparathyroidism of renal origin: Secondary | ICD-10-CM | POA: Diagnosis not present

## 2021-01-09 DIAGNOSIS — R52 Pain, unspecified: Secondary | ICD-10-CM | POA: Diagnosis not present

## 2021-01-09 DIAGNOSIS — E875 Hyperkalemia: Secondary | ICD-10-CM | POA: Diagnosis not present

## 2021-01-11 DIAGNOSIS — D688 Other specified coagulation defects: Secondary | ICD-10-CM | POA: Diagnosis not present

## 2021-01-11 DIAGNOSIS — R52 Pain, unspecified: Secondary | ICD-10-CM | POA: Diagnosis not present

## 2021-01-11 DIAGNOSIS — N2581 Secondary hyperparathyroidism of renal origin: Secondary | ICD-10-CM | POA: Diagnosis not present

## 2021-01-11 DIAGNOSIS — N186 End stage renal disease: Secondary | ICD-10-CM | POA: Diagnosis not present

## 2021-01-11 DIAGNOSIS — Z992 Dependence on renal dialysis: Secondary | ICD-10-CM | POA: Diagnosis not present

## 2021-01-11 DIAGNOSIS — E875 Hyperkalemia: Secondary | ICD-10-CM | POA: Diagnosis not present

## 2021-01-13 DIAGNOSIS — Z992 Dependence on renal dialysis: Secondary | ICD-10-CM | POA: Diagnosis not present

## 2021-01-13 DIAGNOSIS — R52 Pain, unspecified: Secondary | ICD-10-CM | POA: Diagnosis not present

## 2021-01-13 DIAGNOSIS — E875 Hyperkalemia: Secondary | ICD-10-CM | POA: Diagnosis not present

## 2021-01-13 DIAGNOSIS — N186 End stage renal disease: Secondary | ICD-10-CM | POA: Diagnosis not present

## 2021-01-13 DIAGNOSIS — N2581 Secondary hyperparathyroidism of renal origin: Secondary | ICD-10-CM | POA: Diagnosis not present

## 2021-01-13 DIAGNOSIS — D688 Other specified coagulation defects: Secondary | ICD-10-CM | POA: Diagnosis not present

## 2021-01-16 DIAGNOSIS — D688 Other specified coagulation defects: Secondary | ICD-10-CM | POA: Diagnosis not present

## 2021-01-16 DIAGNOSIS — Z992 Dependence on renal dialysis: Secondary | ICD-10-CM | POA: Diagnosis not present

## 2021-01-16 DIAGNOSIS — N186 End stage renal disease: Secondary | ICD-10-CM | POA: Diagnosis not present

## 2021-01-16 DIAGNOSIS — R52 Pain, unspecified: Secondary | ICD-10-CM | POA: Diagnosis not present

## 2021-01-16 DIAGNOSIS — N2581 Secondary hyperparathyroidism of renal origin: Secondary | ICD-10-CM | POA: Diagnosis not present

## 2021-01-16 DIAGNOSIS — E875 Hyperkalemia: Secondary | ICD-10-CM | POA: Diagnosis not present

## 2021-01-18 DIAGNOSIS — R52 Pain, unspecified: Secondary | ICD-10-CM | POA: Diagnosis not present

## 2021-01-18 DIAGNOSIS — N186 End stage renal disease: Secondary | ICD-10-CM | POA: Diagnosis not present

## 2021-01-18 DIAGNOSIS — D688 Other specified coagulation defects: Secondary | ICD-10-CM | POA: Diagnosis not present

## 2021-01-18 DIAGNOSIS — E875 Hyperkalemia: Secondary | ICD-10-CM | POA: Diagnosis not present

## 2021-01-18 DIAGNOSIS — Z992 Dependence on renal dialysis: Secondary | ICD-10-CM | POA: Diagnosis not present

## 2021-01-18 DIAGNOSIS — N2581 Secondary hyperparathyroidism of renal origin: Secondary | ICD-10-CM | POA: Diagnosis not present

## 2021-01-20 DIAGNOSIS — E875 Hyperkalemia: Secondary | ICD-10-CM | POA: Diagnosis not present

## 2021-01-20 DIAGNOSIS — Z992 Dependence on renal dialysis: Secondary | ICD-10-CM | POA: Diagnosis not present

## 2021-01-20 DIAGNOSIS — N2581 Secondary hyperparathyroidism of renal origin: Secondary | ICD-10-CM | POA: Diagnosis not present

## 2021-01-20 DIAGNOSIS — N186 End stage renal disease: Secondary | ICD-10-CM | POA: Diagnosis not present

## 2021-01-20 DIAGNOSIS — R52 Pain, unspecified: Secondary | ICD-10-CM | POA: Diagnosis not present

## 2021-01-20 DIAGNOSIS — D688 Other specified coagulation defects: Secondary | ICD-10-CM | POA: Diagnosis not present

## 2021-01-23 DIAGNOSIS — D688 Other specified coagulation defects: Secondary | ICD-10-CM | POA: Diagnosis not present

## 2021-01-23 DIAGNOSIS — R52 Pain, unspecified: Secondary | ICD-10-CM | POA: Diagnosis not present

## 2021-01-23 DIAGNOSIS — Z992 Dependence on renal dialysis: Secondary | ICD-10-CM | POA: Diagnosis not present

## 2021-01-23 DIAGNOSIS — N2581 Secondary hyperparathyroidism of renal origin: Secondary | ICD-10-CM | POA: Diagnosis not present

## 2021-01-23 DIAGNOSIS — E875 Hyperkalemia: Secondary | ICD-10-CM | POA: Diagnosis not present

## 2021-01-23 DIAGNOSIS — N186 End stage renal disease: Secondary | ICD-10-CM | POA: Diagnosis not present

## 2021-01-25 DIAGNOSIS — E875 Hyperkalemia: Secondary | ICD-10-CM | POA: Diagnosis not present

## 2021-01-25 DIAGNOSIS — Z992 Dependence on renal dialysis: Secondary | ICD-10-CM | POA: Diagnosis not present

## 2021-01-25 DIAGNOSIS — R52 Pain, unspecified: Secondary | ICD-10-CM | POA: Diagnosis not present

## 2021-01-25 DIAGNOSIS — N2581 Secondary hyperparathyroidism of renal origin: Secondary | ICD-10-CM | POA: Diagnosis not present

## 2021-01-25 DIAGNOSIS — N186 End stage renal disease: Secondary | ICD-10-CM | POA: Diagnosis not present

## 2021-01-25 DIAGNOSIS — D688 Other specified coagulation defects: Secondary | ICD-10-CM | POA: Diagnosis not present

## 2021-01-27 DIAGNOSIS — E875 Hyperkalemia: Secondary | ICD-10-CM | POA: Diagnosis not present

## 2021-01-27 DIAGNOSIS — Z992 Dependence on renal dialysis: Secondary | ICD-10-CM | POA: Diagnosis not present

## 2021-01-27 DIAGNOSIS — D688 Other specified coagulation defects: Secondary | ICD-10-CM | POA: Diagnosis not present

## 2021-01-27 DIAGNOSIS — R52 Pain, unspecified: Secondary | ICD-10-CM | POA: Diagnosis not present

## 2021-01-27 DIAGNOSIS — N186 End stage renal disease: Secondary | ICD-10-CM | POA: Diagnosis not present

## 2021-01-27 DIAGNOSIS — N2581 Secondary hyperparathyroidism of renal origin: Secondary | ICD-10-CM | POA: Diagnosis not present

## 2021-01-30 DIAGNOSIS — N2581 Secondary hyperparathyroidism of renal origin: Secondary | ICD-10-CM | POA: Diagnosis not present

## 2021-01-30 DIAGNOSIS — Z992 Dependence on renal dialysis: Secondary | ICD-10-CM | POA: Diagnosis not present

## 2021-01-30 DIAGNOSIS — R52 Pain, unspecified: Secondary | ICD-10-CM | POA: Diagnosis not present

## 2021-01-30 DIAGNOSIS — D688 Other specified coagulation defects: Secondary | ICD-10-CM | POA: Diagnosis not present

## 2021-01-30 DIAGNOSIS — E875 Hyperkalemia: Secondary | ICD-10-CM | POA: Diagnosis not present

## 2021-01-30 DIAGNOSIS — N186 End stage renal disease: Secondary | ICD-10-CM | POA: Diagnosis not present

## 2021-02-01 DIAGNOSIS — Z992 Dependence on renal dialysis: Secondary | ICD-10-CM | POA: Diagnosis not present

## 2021-02-01 DIAGNOSIS — R52 Pain, unspecified: Secondary | ICD-10-CM | POA: Diagnosis not present

## 2021-02-01 DIAGNOSIS — N2581 Secondary hyperparathyroidism of renal origin: Secondary | ICD-10-CM | POA: Diagnosis not present

## 2021-02-01 DIAGNOSIS — E875 Hyperkalemia: Secondary | ICD-10-CM | POA: Diagnosis not present

## 2021-02-01 DIAGNOSIS — N186 End stage renal disease: Secondary | ICD-10-CM | POA: Diagnosis not present

## 2021-02-01 DIAGNOSIS — D688 Other specified coagulation defects: Secondary | ICD-10-CM | POA: Diagnosis not present

## 2021-02-03 DIAGNOSIS — N186 End stage renal disease: Secondary | ICD-10-CM | POA: Diagnosis not present

## 2021-02-03 DIAGNOSIS — D688 Other specified coagulation defects: Secondary | ICD-10-CM | POA: Diagnosis not present

## 2021-02-03 DIAGNOSIS — E875 Hyperkalemia: Secondary | ICD-10-CM | POA: Diagnosis not present

## 2021-02-03 DIAGNOSIS — Z992 Dependence on renal dialysis: Secondary | ICD-10-CM | POA: Diagnosis not present

## 2021-02-03 DIAGNOSIS — R52 Pain, unspecified: Secondary | ICD-10-CM | POA: Diagnosis not present

## 2021-02-03 DIAGNOSIS — N2581 Secondary hyperparathyroidism of renal origin: Secondary | ICD-10-CM | POA: Diagnosis not present

## 2021-02-05 DIAGNOSIS — I129 Hypertensive chronic kidney disease with stage 1 through stage 4 chronic kidney disease, or unspecified chronic kidney disease: Secondary | ICD-10-CM | POA: Diagnosis not present

## 2021-02-05 DIAGNOSIS — Z992 Dependence on renal dialysis: Secondary | ICD-10-CM | POA: Diagnosis not present

## 2021-02-05 DIAGNOSIS — N186 End stage renal disease: Secondary | ICD-10-CM | POA: Diagnosis not present

## 2021-02-06 DIAGNOSIS — D688 Other specified coagulation defects: Secondary | ICD-10-CM | POA: Diagnosis not present

## 2021-02-06 DIAGNOSIS — N186 End stage renal disease: Secondary | ICD-10-CM | POA: Diagnosis not present

## 2021-02-06 DIAGNOSIS — Z992 Dependence on renal dialysis: Secondary | ICD-10-CM | POA: Diagnosis not present

## 2021-02-06 DIAGNOSIS — N2581 Secondary hyperparathyroidism of renal origin: Secondary | ICD-10-CM | POA: Diagnosis not present

## 2021-02-06 DIAGNOSIS — R52 Pain, unspecified: Secondary | ICD-10-CM | POA: Diagnosis not present

## 2021-02-08 DIAGNOSIS — R52 Pain, unspecified: Secondary | ICD-10-CM | POA: Diagnosis not present

## 2021-02-08 DIAGNOSIS — Z992 Dependence on renal dialysis: Secondary | ICD-10-CM | POA: Diagnosis not present

## 2021-02-08 DIAGNOSIS — N2581 Secondary hyperparathyroidism of renal origin: Secondary | ICD-10-CM | POA: Diagnosis not present

## 2021-02-08 DIAGNOSIS — N186 End stage renal disease: Secondary | ICD-10-CM | POA: Diagnosis not present

## 2021-02-08 DIAGNOSIS — D688 Other specified coagulation defects: Secondary | ICD-10-CM | POA: Diagnosis not present

## 2021-02-10 DIAGNOSIS — Z992 Dependence on renal dialysis: Secondary | ICD-10-CM | POA: Diagnosis not present

## 2021-02-10 DIAGNOSIS — R52 Pain, unspecified: Secondary | ICD-10-CM | POA: Diagnosis not present

## 2021-02-10 DIAGNOSIS — D688 Other specified coagulation defects: Secondary | ICD-10-CM | POA: Diagnosis not present

## 2021-02-10 DIAGNOSIS — N2581 Secondary hyperparathyroidism of renal origin: Secondary | ICD-10-CM | POA: Diagnosis not present

## 2021-02-10 DIAGNOSIS — N186 End stage renal disease: Secondary | ICD-10-CM | POA: Diagnosis not present

## 2021-02-13 DIAGNOSIS — N2581 Secondary hyperparathyroidism of renal origin: Secondary | ICD-10-CM | POA: Diagnosis not present

## 2021-02-13 DIAGNOSIS — N186 End stage renal disease: Secondary | ICD-10-CM | POA: Diagnosis not present

## 2021-02-13 DIAGNOSIS — R52 Pain, unspecified: Secondary | ICD-10-CM | POA: Diagnosis not present

## 2021-02-13 DIAGNOSIS — D688 Other specified coagulation defects: Secondary | ICD-10-CM | POA: Diagnosis not present

## 2021-02-13 DIAGNOSIS — Z992 Dependence on renal dialysis: Secondary | ICD-10-CM | POA: Diagnosis not present

## 2021-02-15 DIAGNOSIS — R52 Pain, unspecified: Secondary | ICD-10-CM | POA: Diagnosis not present

## 2021-02-15 DIAGNOSIS — Z992 Dependence on renal dialysis: Secondary | ICD-10-CM | POA: Diagnosis not present

## 2021-02-15 DIAGNOSIS — D688 Other specified coagulation defects: Secondary | ICD-10-CM | POA: Diagnosis not present

## 2021-02-15 DIAGNOSIS — N186 End stage renal disease: Secondary | ICD-10-CM | POA: Diagnosis not present

## 2021-02-15 DIAGNOSIS — N2581 Secondary hyperparathyroidism of renal origin: Secondary | ICD-10-CM | POA: Diagnosis not present

## 2021-02-17 DIAGNOSIS — N186 End stage renal disease: Secondary | ICD-10-CM | POA: Diagnosis not present

## 2021-02-17 DIAGNOSIS — R52 Pain, unspecified: Secondary | ICD-10-CM | POA: Diagnosis not present

## 2021-02-17 DIAGNOSIS — Z992 Dependence on renal dialysis: Secondary | ICD-10-CM | POA: Diagnosis not present

## 2021-02-17 DIAGNOSIS — D688 Other specified coagulation defects: Secondary | ICD-10-CM | POA: Diagnosis not present

## 2021-02-17 DIAGNOSIS — N2581 Secondary hyperparathyroidism of renal origin: Secondary | ICD-10-CM | POA: Diagnosis not present

## 2021-02-20 DIAGNOSIS — Z992 Dependence on renal dialysis: Secondary | ICD-10-CM | POA: Diagnosis not present

## 2021-02-20 DIAGNOSIS — N186 End stage renal disease: Secondary | ICD-10-CM | POA: Diagnosis not present

## 2021-02-20 DIAGNOSIS — D688 Other specified coagulation defects: Secondary | ICD-10-CM | POA: Diagnosis not present

## 2021-02-20 DIAGNOSIS — N2581 Secondary hyperparathyroidism of renal origin: Secondary | ICD-10-CM | POA: Diagnosis not present

## 2021-02-20 DIAGNOSIS — R52 Pain, unspecified: Secondary | ICD-10-CM | POA: Diagnosis not present

## 2021-02-22 DIAGNOSIS — R52 Pain, unspecified: Secondary | ICD-10-CM | POA: Diagnosis not present

## 2021-02-22 DIAGNOSIS — D688 Other specified coagulation defects: Secondary | ICD-10-CM | POA: Diagnosis not present

## 2021-02-22 DIAGNOSIS — N186 End stage renal disease: Secondary | ICD-10-CM | POA: Diagnosis not present

## 2021-02-22 DIAGNOSIS — N2581 Secondary hyperparathyroidism of renal origin: Secondary | ICD-10-CM | POA: Diagnosis not present

## 2021-02-22 DIAGNOSIS — Z992 Dependence on renal dialysis: Secondary | ICD-10-CM | POA: Diagnosis not present

## 2021-02-24 DIAGNOSIS — Z992 Dependence on renal dialysis: Secondary | ICD-10-CM | POA: Diagnosis not present

## 2021-02-24 DIAGNOSIS — N2581 Secondary hyperparathyroidism of renal origin: Secondary | ICD-10-CM | POA: Diagnosis not present

## 2021-02-24 DIAGNOSIS — D688 Other specified coagulation defects: Secondary | ICD-10-CM | POA: Diagnosis not present

## 2021-02-24 DIAGNOSIS — R52 Pain, unspecified: Secondary | ICD-10-CM | POA: Diagnosis not present

## 2021-02-24 DIAGNOSIS — N186 End stage renal disease: Secondary | ICD-10-CM | POA: Diagnosis not present

## 2021-02-27 DIAGNOSIS — R52 Pain, unspecified: Secondary | ICD-10-CM | POA: Diagnosis not present

## 2021-02-27 DIAGNOSIS — N2581 Secondary hyperparathyroidism of renal origin: Secondary | ICD-10-CM | POA: Diagnosis not present

## 2021-02-27 DIAGNOSIS — D688 Other specified coagulation defects: Secondary | ICD-10-CM | POA: Diagnosis not present

## 2021-02-27 DIAGNOSIS — Z992 Dependence on renal dialysis: Secondary | ICD-10-CM | POA: Diagnosis not present

## 2021-02-27 DIAGNOSIS — N186 End stage renal disease: Secondary | ICD-10-CM | POA: Diagnosis not present

## 2021-03-01 DIAGNOSIS — R52 Pain, unspecified: Secondary | ICD-10-CM | POA: Diagnosis not present

## 2021-03-01 DIAGNOSIS — N2581 Secondary hyperparathyroidism of renal origin: Secondary | ICD-10-CM | POA: Diagnosis not present

## 2021-03-01 DIAGNOSIS — N186 End stage renal disease: Secondary | ICD-10-CM | POA: Diagnosis not present

## 2021-03-01 DIAGNOSIS — Z992 Dependence on renal dialysis: Secondary | ICD-10-CM | POA: Diagnosis not present

## 2021-03-01 DIAGNOSIS — D688 Other specified coagulation defects: Secondary | ICD-10-CM | POA: Diagnosis not present

## 2021-03-03 DIAGNOSIS — R52 Pain, unspecified: Secondary | ICD-10-CM | POA: Diagnosis not present

## 2021-03-03 DIAGNOSIS — Z992 Dependence on renal dialysis: Secondary | ICD-10-CM | POA: Diagnosis not present

## 2021-03-03 DIAGNOSIS — N186 End stage renal disease: Secondary | ICD-10-CM | POA: Diagnosis not present

## 2021-03-03 DIAGNOSIS — D688 Other specified coagulation defects: Secondary | ICD-10-CM | POA: Diagnosis not present

## 2021-03-03 DIAGNOSIS — N2581 Secondary hyperparathyroidism of renal origin: Secondary | ICD-10-CM | POA: Diagnosis not present

## 2021-03-05 ENCOUNTER — Other Ambulatory Visit: Payer: Self-pay | Admitting: Cardiology

## 2021-03-06 ENCOUNTER — Other Ambulatory Visit: Payer: Self-pay | Admitting: Cardiology

## 2021-03-06 DIAGNOSIS — N2581 Secondary hyperparathyroidism of renal origin: Secondary | ICD-10-CM | POA: Diagnosis not present

## 2021-03-06 DIAGNOSIS — R52 Pain, unspecified: Secondary | ICD-10-CM | POA: Diagnosis not present

## 2021-03-06 DIAGNOSIS — Z992 Dependence on renal dialysis: Secondary | ICD-10-CM | POA: Diagnosis not present

## 2021-03-06 DIAGNOSIS — D688 Other specified coagulation defects: Secondary | ICD-10-CM | POA: Diagnosis not present

## 2021-03-06 DIAGNOSIS — N186 End stage renal disease: Secondary | ICD-10-CM | POA: Diagnosis not present

## 2021-03-08 DIAGNOSIS — N186 End stage renal disease: Secondary | ICD-10-CM | POA: Diagnosis not present

## 2021-03-08 DIAGNOSIS — R52 Pain, unspecified: Secondary | ICD-10-CM | POA: Diagnosis not present

## 2021-03-08 DIAGNOSIS — I129 Hypertensive chronic kidney disease with stage 1 through stage 4 chronic kidney disease, or unspecified chronic kidney disease: Secondary | ICD-10-CM | POA: Diagnosis not present

## 2021-03-08 DIAGNOSIS — D688 Other specified coagulation defects: Secondary | ICD-10-CM | POA: Diagnosis not present

## 2021-03-08 DIAGNOSIS — N2581 Secondary hyperparathyroidism of renal origin: Secondary | ICD-10-CM | POA: Diagnosis not present

## 2021-03-08 DIAGNOSIS — Z992 Dependence on renal dialysis: Secondary | ICD-10-CM | POA: Diagnosis not present

## 2021-03-10 DIAGNOSIS — D688 Other specified coagulation defects: Secondary | ICD-10-CM | POA: Diagnosis not present

## 2021-03-10 DIAGNOSIS — N186 End stage renal disease: Secondary | ICD-10-CM | POA: Diagnosis not present

## 2021-03-10 DIAGNOSIS — N2581 Secondary hyperparathyroidism of renal origin: Secondary | ICD-10-CM | POA: Diagnosis not present

## 2021-03-10 DIAGNOSIS — Z992 Dependence on renal dialysis: Secondary | ICD-10-CM | POA: Diagnosis not present

## 2021-03-13 DIAGNOSIS — N2581 Secondary hyperparathyroidism of renal origin: Secondary | ICD-10-CM | POA: Diagnosis not present

## 2021-03-13 DIAGNOSIS — D688 Other specified coagulation defects: Secondary | ICD-10-CM | POA: Diagnosis not present

## 2021-03-13 DIAGNOSIS — N186 End stage renal disease: Secondary | ICD-10-CM | POA: Diagnosis not present

## 2021-03-13 DIAGNOSIS — Z992 Dependence on renal dialysis: Secondary | ICD-10-CM | POA: Diagnosis not present

## 2021-03-15 DIAGNOSIS — D688 Other specified coagulation defects: Secondary | ICD-10-CM | POA: Diagnosis not present

## 2021-03-15 DIAGNOSIS — N186 End stage renal disease: Secondary | ICD-10-CM | POA: Diagnosis not present

## 2021-03-15 DIAGNOSIS — N2581 Secondary hyperparathyroidism of renal origin: Secondary | ICD-10-CM | POA: Diagnosis not present

## 2021-03-15 DIAGNOSIS — Z992 Dependence on renal dialysis: Secondary | ICD-10-CM | POA: Diagnosis not present

## 2021-03-17 DIAGNOSIS — N186 End stage renal disease: Secondary | ICD-10-CM | POA: Diagnosis not present

## 2021-03-17 DIAGNOSIS — Z992 Dependence on renal dialysis: Secondary | ICD-10-CM | POA: Diagnosis not present

## 2021-03-17 DIAGNOSIS — N2581 Secondary hyperparathyroidism of renal origin: Secondary | ICD-10-CM | POA: Diagnosis not present

## 2021-03-17 DIAGNOSIS — D688 Other specified coagulation defects: Secondary | ICD-10-CM | POA: Diagnosis not present

## 2021-03-20 DIAGNOSIS — Z992 Dependence on renal dialysis: Secondary | ICD-10-CM | POA: Diagnosis not present

## 2021-03-20 DIAGNOSIS — D688 Other specified coagulation defects: Secondary | ICD-10-CM | POA: Diagnosis not present

## 2021-03-20 DIAGNOSIS — N2581 Secondary hyperparathyroidism of renal origin: Secondary | ICD-10-CM | POA: Diagnosis not present

## 2021-03-20 DIAGNOSIS — N186 End stage renal disease: Secondary | ICD-10-CM | POA: Diagnosis not present

## 2021-03-22 DIAGNOSIS — N186 End stage renal disease: Secondary | ICD-10-CM | POA: Diagnosis not present

## 2021-03-22 DIAGNOSIS — N2581 Secondary hyperparathyroidism of renal origin: Secondary | ICD-10-CM | POA: Diagnosis not present

## 2021-03-22 DIAGNOSIS — Z992 Dependence on renal dialysis: Secondary | ICD-10-CM | POA: Diagnosis not present

## 2021-03-22 DIAGNOSIS — D688 Other specified coagulation defects: Secondary | ICD-10-CM | POA: Diagnosis not present

## 2021-03-24 DIAGNOSIS — N186 End stage renal disease: Secondary | ICD-10-CM | POA: Diagnosis not present

## 2021-03-24 DIAGNOSIS — D688 Other specified coagulation defects: Secondary | ICD-10-CM | POA: Diagnosis not present

## 2021-03-24 DIAGNOSIS — N2581 Secondary hyperparathyroidism of renal origin: Secondary | ICD-10-CM | POA: Diagnosis not present

## 2021-03-24 DIAGNOSIS — Z992 Dependence on renal dialysis: Secondary | ICD-10-CM | POA: Diagnosis not present

## 2021-03-27 DIAGNOSIS — Z992 Dependence on renal dialysis: Secondary | ICD-10-CM | POA: Diagnosis not present

## 2021-03-27 DIAGNOSIS — N2581 Secondary hyperparathyroidism of renal origin: Secondary | ICD-10-CM | POA: Diagnosis not present

## 2021-03-27 DIAGNOSIS — N186 End stage renal disease: Secondary | ICD-10-CM | POA: Diagnosis not present

## 2021-03-27 DIAGNOSIS — D688 Other specified coagulation defects: Secondary | ICD-10-CM | POA: Diagnosis not present

## 2021-03-29 DIAGNOSIS — N186 End stage renal disease: Secondary | ICD-10-CM | POA: Diagnosis not present

## 2021-03-29 DIAGNOSIS — Z992 Dependence on renal dialysis: Secondary | ICD-10-CM | POA: Diagnosis not present

## 2021-03-29 DIAGNOSIS — D688 Other specified coagulation defects: Secondary | ICD-10-CM | POA: Diagnosis not present

## 2021-03-29 DIAGNOSIS — N2581 Secondary hyperparathyroidism of renal origin: Secondary | ICD-10-CM | POA: Diagnosis not present

## 2021-03-31 DIAGNOSIS — N186 End stage renal disease: Secondary | ICD-10-CM | POA: Diagnosis not present

## 2021-03-31 DIAGNOSIS — D688 Other specified coagulation defects: Secondary | ICD-10-CM | POA: Diagnosis not present

## 2021-03-31 DIAGNOSIS — N2581 Secondary hyperparathyroidism of renal origin: Secondary | ICD-10-CM | POA: Diagnosis not present

## 2021-03-31 DIAGNOSIS — Z992 Dependence on renal dialysis: Secondary | ICD-10-CM | POA: Diagnosis not present

## 2021-04-02 ENCOUNTER — Other Ambulatory Visit: Payer: Self-pay | Admitting: Cardiology

## 2021-04-03 DIAGNOSIS — N186 End stage renal disease: Secondary | ICD-10-CM | POA: Diagnosis not present

## 2021-04-03 DIAGNOSIS — D688 Other specified coagulation defects: Secondary | ICD-10-CM | POA: Diagnosis not present

## 2021-04-03 DIAGNOSIS — N2581 Secondary hyperparathyroidism of renal origin: Secondary | ICD-10-CM | POA: Diagnosis not present

## 2021-04-03 DIAGNOSIS — Z992 Dependence on renal dialysis: Secondary | ICD-10-CM | POA: Diagnosis not present

## 2021-04-04 ENCOUNTER — Other Ambulatory Visit: Payer: Self-pay | Admitting: Cardiology

## 2021-04-05 DIAGNOSIS — D688 Other specified coagulation defects: Secondary | ICD-10-CM | POA: Diagnosis not present

## 2021-04-05 DIAGNOSIS — Z992 Dependence on renal dialysis: Secondary | ICD-10-CM | POA: Diagnosis not present

## 2021-04-05 DIAGNOSIS — N186 End stage renal disease: Secondary | ICD-10-CM | POA: Diagnosis not present

## 2021-04-05 DIAGNOSIS — N2581 Secondary hyperparathyroidism of renal origin: Secondary | ICD-10-CM | POA: Diagnosis not present

## 2021-04-07 DIAGNOSIS — D688 Other specified coagulation defects: Secondary | ICD-10-CM | POA: Diagnosis not present

## 2021-04-07 DIAGNOSIS — I129 Hypertensive chronic kidney disease with stage 1 through stage 4 chronic kidney disease, or unspecified chronic kidney disease: Secondary | ICD-10-CM | POA: Diagnosis not present

## 2021-04-07 DIAGNOSIS — N186 End stage renal disease: Secondary | ICD-10-CM | POA: Diagnosis not present

## 2021-04-07 DIAGNOSIS — Z992 Dependence on renal dialysis: Secondary | ICD-10-CM | POA: Diagnosis not present

## 2021-04-07 DIAGNOSIS — N2581 Secondary hyperparathyroidism of renal origin: Secondary | ICD-10-CM | POA: Diagnosis not present

## 2021-04-10 DIAGNOSIS — N186 End stage renal disease: Secondary | ICD-10-CM | POA: Diagnosis not present

## 2021-04-10 DIAGNOSIS — Z23 Encounter for immunization: Secondary | ICD-10-CM | POA: Diagnosis not present

## 2021-04-10 DIAGNOSIS — R52 Pain, unspecified: Secondary | ICD-10-CM | POA: Diagnosis not present

## 2021-04-10 DIAGNOSIS — D688 Other specified coagulation defects: Secondary | ICD-10-CM | POA: Diagnosis not present

## 2021-04-10 DIAGNOSIS — Z992 Dependence on renal dialysis: Secondary | ICD-10-CM | POA: Diagnosis not present

## 2021-04-10 DIAGNOSIS — N2581 Secondary hyperparathyroidism of renal origin: Secondary | ICD-10-CM | POA: Diagnosis not present

## 2021-04-10 DIAGNOSIS — E875 Hyperkalemia: Secondary | ICD-10-CM | POA: Diagnosis not present

## 2021-04-10 DIAGNOSIS — D631 Anemia in chronic kidney disease: Secondary | ICD-10-CM | POA: Diagnosis not present

## 2021-04-12 DIAGNOSIS — Z992 Dependence on renal dialysis: Secondary | ICD-10-CM | POA: Diagnosis not present

## 2021-04-12 DIAGNOSIS — N186 End stage renal disease: Secondary | ICD-10-CM | POA: Diagnosis not present

## 2021-04-12 DIAGNOSIS — D688 Other specified coagulation defects: Secondary | ICD-10-CM | POA: Diagnosis not present

## 2021-04-12 DIAGNOSIS — D631 Anemia in chronic kidney disease: Secondary | ICD-10-CM | POA: Diagnosis not present

## 2021-04-12 DIAGNOSIS — E875 Hyperkalemia: Secondary | ICD-10-CM | POA: Diagnosis not present

## 2021-04-12 DIAGNOSIS — R52 Pain, unspecified: Secondary | ICD-10-CM | POA: Diagnosis not present

## 2021-04-12 DIAGNOSIS — N2581 Secondary hyperparathyroidism of renal origin: Secondary | ICD-10-CM | POA: Diagnosis not present

## 2021-04-12 DIAGNOSIS — Z23 Encounter for immunization: Secondary | ICD-10-CM | POA: Diagnosis not present

## 2021-04-14 DIAGNOSIS — E875 Hyperkalemia: Secondary | ICD-10-CM | POA: Diagnosis not present

## 2021-04-14 DIAGNOSIS — Z992 Dependence on renal dialysis: Secondary | ICD-10-CM | POA: Diagnosis not present

## 2021-04-14 DIAGNOSIS — Z23 Encounter for immunization: Secondary | ICD-10-CM | POA: Diagnosis not present

## 2021-04-14 DIAGNOSIS — D688 Other specified coagulation defects: Secondary | ICD-10-CM | POA: Diagnosis not present

## 2021-04-14 DIAGNOSIS — N2581 Secondary hyperparathyroidism of renal origin: Secondary | ICD-10-CM | POA: Diagnosis not present

## 2021-04-14 DIAGNOSIS — R52 Pain, unspecified: Secondary | ICD-10-CM | POA: Diagnosis not present

## 2021-04-14 DIAGNOSIS — D631 Anemia in chronic kidney disease: Secondary | ICD-10-CM | POA: Diagnosis not present

## 2021-04-14 DIAGNOSIS — N186 End stage renal disease: Secondary | ICD-10-CM | POA: Diagnosis not present

## 2021-04-17 DIAGNOSIS — R52 Pain, unspecified: Secondary | ICD-10-CM | POA: Diagnosis not present

## 2021-04-17 DIAGNOSIS — D631 Anemia in chronic kidney disease: Secondary | ICD-10-CM | POA: Diagnosis not present

## 2021-04-17 DIAGNOSIS — N2581 Secondary hyperparathyroidism of renal origin: Secondary | ICD-10-CM | POA: Diagnosis not present

## 2021-04-17 DIAGNOSIS — E875 Hyperkalemia: Secondary | ICD-10-CM | POA: Diagnosis not present

## 2021-04-17 DIAGNOSIS — N186 End stage renal disease: Secondary | ICD-10-CM | POA: Diagnosis not present

## 2021-04-17 DIAGNOSIS — Z992 Dependence on renal dialysis: Secondary | ICD-10-CM | POA: Diagnosis not present

## 2021-04-17 DIAGNOSIS — Z23 Encounter for immunization: Secondary | ICD-10-CM | POA: Diagnosis not present

## 2021-04-17 DIAGNOSIS — D688 Other specified coagulation defects: Secondary | ICD-10-CM | POA: Diagnosis not present

## 2021-04-19 ENCOUNTER — Encounter: Payer: Self-pay | Admitting: Gastroenterology

## 2021-04-19 DIAGNOSIS — Z992 Dependence on renal dialysis: Secondary | ICD-10-CM | POA: Diagnosis not present

## 2021-04-19 DIAGNOSIS — D631 Anemia in chronic kidney disease: Secondary | ICD-10-CM | POA: Diagnosis not present

## 2021-04-19 DIAGNOSIS — D688 Other specified coagulation defects: Secondary | ICD-10-CM | POA: Diagnosis not present

## 2021-04-19 DIAGNOSIS — N186 End stage renal disease: Secondary | ICD-10-CM | POA: Diagnosis not present

## 2021-04-19 DIAGNOSIS — N2581 Secondary hyperparathyroidism of renal origin: Secondary | ICD-10-CM | POA: Diagnosis not present

## 2021-04-19 DIAGNOSIS — E875 Hyperkalemia: Secondary | ICD-10-CM | POA: Diagnosis not present

## 2021-04-19 DIAGNOSIS — R52 Pain, unspecified: Secondary | ICD-10-CM | POA: Diagnosis not present

## 2021-04-19 DIAGNOSIS — Z23 Encounter for immunization: Secondary | ICD-10-CM | POA: Diagnosis not present

## 2021-04-21 DIAGNOSIS — R52 Pain, unspecified: Secondary | ICD-10-CM | POA: Diagnosis not present

## 2021-04-21 DIAGNOSIS — D631 Anemia in chronic kidney disease: Secondary | ICD-10-CM | POA: Diagnosis not present

## 2021-04-21 DIAGNOSIS — Z992 Dependence on renal dialysis: Secondary | ICD-10-CM | POA: Diagnosis not present

## 2021-04-21 DIAGNOSIS — E875 Hyperkalemia: Secondary | ICD-10-CM | POA: Diagnosis not present

## 2021-04-21 DIAGNOSIS — Z23 Encounter for immunization: Secondary | ICD-10-CM | POA: Diagnosis not present

## 2021-04-21 DIAGNOSIS — N186 End stage renal disease: Secondary | ICD-10-CM | POA: Diagnosis not present

## 2021-04-21 DIAGNOSIS — N2581 Secondary hyperparathyroidism of renal origin: Secondary | ICD-10-CM | POA: Diagnosis not present

## 2021-04-21 DIAGNOSIS — D688 Other specified coagulation defects: Secondary | ICD-10-CM | POA: Diagnosis not present

## 2021-04-24 DIAGNOSIS — R52 Pain, unspecified: Secondary | ICD-10-CM | POA: Diagnosis not present

## 2021-04-24 DIAGNOSIS — N186 End stage renal disease: Secondary | ICD-10-CM | POA: Diagnosis not present

## 2021-04-24 DIAGNOSIS — E875 Hyperkalemia: Secondary | ICD-10-CM | POA: Diagnosis not present

## 2021-04-24 DIAGNOSIS — Z992 Dependence on renal dialysis: Secondary | ICD-10-CM | POA: Diagnosis not present

## 2021-04-24 DIAGNOSIS — Z23 Encounter for immunization: Secondary | ICD-10-CM | POA: Diagnosis not present

## 2021-04-24 DIAGNOSIS — D688 Other specified coagulation defects: Secondary | ICD-10-CM | POA: Diagnosis not present

## 2021-04-24 DIAGNOSIS — D631 Anemia in chronic kidney disease: Secondary | ICD-10-CM | POA: Diagnosis not present

## 2021-04-24 DIAGNOSIS — N2581 Secondary hyperparathyroidism of renal origin: Secondary | ICD-10-CM | POA: Diagnosis not present

## 2021-04-26 DIAGNOSIS — D688 Other specified coagulation defects: Secondary | ICD-10-CM | POA: Diagnosis not present

## 2021-04-26 DIAGNOSIS — E875 Hyperkalemia: Secondary | ICD-10-CM | POA: Diagnosis not present

## 2021-04-26 DIAGNOSIS — N2581 Secondary hyperparathyroidism of renal origin: Secondary | ICD-10-CM | POA: Diagnosis not present

## 2021-04-26 DIAGNOSIS — R52 Pain, unspecified: Secondary | ICD-10-CM | POA: Diagnosis not present

## 2021-04-26 DIAGNOSIS — Z23 Encounter for immunization: Secondary | ICD-10-CM | POA: Diagnosis not present

## 2021-04-26 DIAGNOSIS — Z992 Dependence on renal dialysis: Secondary | ICD-10-CM | POA: Diagnosis not present

## 2021-04-26 DIAGNOSIS — D631 Anemia in chronic kidney disease: Secondary | ICD-10-CM | POA: Diagnosis not present

## 2021-04-26 DIAGNOSIS — N186 End stage renal disease: Secondary | ICD-10-CM | POA: Diagnosis not present

## 2021-04-27 DIAGNOSIS — H60531 Acute contact otitis externa, right ear: Secondary | ICD-10-CM | POA: Diagnosis not present

## 2021-04-28 DIAGNOSIS — E875 Hyperkalemia: Secondary | ICD-10-CM | POA: Diagnosis not present

## 2021-04-28 DIAGNOSIS — Z992 Dependence on renal dialysis: Secondary | ICD-10-CM | POA: Diagnosis not present

## 2021-04-28 DIAGNOSIS — D688 Other specified coagulation defects: Secondary | ICD-10-CM | POA: Diagnosis not present

## 2021-04-28 DIAGNOSIS — N2581 Secondary hyperparathyroidism of renal origin: Secondary | ICD-10-CM | POA: Diagnosis not present

## 2021-04-28 DIAGNOSIS — N186 End stage renal disease: Secondary | ICD-10-CM | POA: Diagnosis not present

## 2021-04-28 DIAGNOSIS — Z23 Encounter for immunization: Secondary | ICD-10-CM | POA: Diagnosis not present

## 2021-04-28 DIAGNOSIS — R52 Pain, unspecified: Secondary | ICD-10-CM | POA: Diagnosis not present

## 2021-04-28 DIAGNOSIS — D631 Anemia in chronic kidney disease: Secondary | ICD-10-CM | POA: Diagnosis not present

## 2021-05-01 DIAGNOSIS — N186 End stage renal disease: Secondary | ICD-10-CM | POA: Diagnosis not present

## 2021-05-01 DIAGNOSIS — D688 Other specified coagulation defects: Secondary | ICD-10-CM | POA: Diagnosis not present

## 2021-05-01 DIAGNOSIS — E875 Hyperkalemia: Secondary | ICD-10-CM | POA: Diagnosis not present

## 2021-05-01 DIAGNOSIS — Z992 Dependence on renal dialysis: Secondary | ICD-10-CM | POA: Diagnosis not present

## 2021-05-01 DIAGNOSIS — R52 Pain, unspecified: Secondary | ICD-10-CM | POA: Diagnosis not present

## 2021-05-01 DIAGNOSIS — Z23 Encounter for immunization: Secondary | ICD-10-CM | POA: Diagnosis not present

## 2021-05-01 DIAGNOSIS — N2581 Secondary hyperparathyroidism of renal origin: Secondary | ICD-10-CM | POA: Diagnosis not present

## 2021-05-01 DIAGNOSIS — D631 Anemia in chronic kidney disease: Secondary | ICD-10-CM | POA: Diagnosis not present

## 2021-05-03 DIAGNOSIS — Z992 Dependence on renal dialysis: Secondary | ICD-10-CM | POA: Diagnosis not present

## 2021-05-03 DIAGNOSIS — Z23 Encounter for immunization: Secondary | ICD-10-CM | POA: Diagnosis not present

## 2021-05-03 DIAGNOSIS — N2581 Secondary hyperparathyroidism of renal origin: Secondary | ICD-10-CM | POA: Diagnosis not present

## 2021-05-03 DIAGNOSIS — N186 End stage renal disease: Secondary | ICD-10-CM | POA: Diagnosis not present

## 2021-05-03 DIAGNOSIS — E875 Hyperkalemia: Secondary | ICD-10-CM | POA: Diagnosis not present

## 2021-05-03 DIAGNOSIS — R52 Pain, unspecified: Secondary | ICD-10-CM | POA: Diagnosis not present

## 2021-05-03 DIAGNOSIS — D631 Anemia in chronic kidney disease: Secondary | ICD-10-CM | POA: Diagnosis not present

## 2021-05-03 DIAGNOSIS — D688 Other specified coagulation defects: Secondary | ICD-10-CM | POA: Diagnosis not present

## 2021-05-05 DIAGNOSIS — D688 Other specified coagulation defects: Secondary | ICD-10-CM | POA: Diagnosis not present

## 2021-05-05 DIAGNOSIS — Z992 Dependence on renal dialysis: Secondary | ICD-10-CM | POA: Diagnosis not present

## 2021-05-05 DIAGNOSIS — N186 End stage renal disease: Secondary | ICD-10-CM | POA: Diagnosis not present

## 2021-05-05 DIAGNOSIS — Z23 Encounter for immunization: Secondary | ICD-10-CM | POA: Diagnosis not present

## 2021-05-05 DIAGNOSIS — N2581 Secondary hyperparathyroidism of renal origin: Secondary | ICD-10-CM | POA: Diagnosis not present

## 2021-05-05 DIAGNOSIS — R52 Pain, unspecified: Secondary | ICD-10-CM | POA: Diagnosis not present

## 2021-05-05 DIAGNOSIS — D631 Anemia in chronic kidney disease: Secondary | ICD-10-CM | POA: Diagnosis not present

## 2021-05-05 DIAGNOSIS — E875 Hyperkalemia: Secondary | ICD-10-CM | POA: Diagnosis not present

## 2021-05-08 DIAGNOSIS — Z23 Encounter for immunization: Secondary | ICD-10-CM | POA: Diagnosis not present

## 2021-05-08 DIAGNOSIS — N2581 Secondary hyperparathyroidism of renal origin: Secondary | ICD-10-CM | POA: Diagnosis not present

## 2021-05-08 DIAGNOSIS — R52 Pain, unspecified: Secondary | ICD-10-CM | POA: Diagnosis not present

## 2021-05-08 DIAGNOSIS — I129 Hypertensive chronic kidney disease with stage 1 through stage 4 chronic kidney disease, or unspecified chronic kidney disease: Secondary | ICD-10-CM | POA: Diagnosis not present

## 2021-05-08 DIAGNOSIS — D688 Other specified coagulation defects: Secondary | ICD-10-CM | POA: Diagnosis not present

## 2021-05-08 DIAGNOSIS — N186 End stage renal disease: Secondary | ICD-10-CM | POA: Diagnosis not present

## 2021-05-08 DIAGNOSIS — D631 Anemia in chronic kidney disease: Secondary | ICD-10-CM | POA: Diagnosis not present

## 2021-05-08 DIAGNOSIS — Z992 Dependence on renal dialysis: Secondary | ICD-10-CM | POA: Diagnosis not present

## 2021-05-08 DIAGNOSIS — E875 Hyperkalemia: Secondary | ICD-10-CM | POA: Diagnosis not present

## 2021-05-09 ENCOUNTER — Emergency Department (HOSPITAL_COMMUNITY): Payer: No Typology Code available for payment source

## 2021-05-09 ENCOUNTER — Emergency Department (HOSPITAL_COMMUNITY)
Admission: EM | Admit: 2021-05-09 | Discharge: 2021-05-09 | Disposition: A | Discharge status: Home / Self Care | Payer: No Typology Code available for payment source | Attending: Emergency Medicine | Admitting: Emergency Medicine

## 2021-05-09 ENCOUNTER — Other Ambulatory Visit: Payer: Self-pay

## 2021-05-09 DIAGNOSIS — H6121 Impacted cerumen, right ear: Secondary | ICD-10-CM | POA: Diagnosis not present

## 2021-05-09 DIAGNOSIS — I5032 Chronic diastolic (congestive) heart failure: Secondary | ICD-10-CM | POA: Diagnosis not present

## 2021-05-09 DIAGNOSIS — N186 End stage renal disease: Secondary | ICD-10-CM | POA: Insufficient documentation

## 2021-05-09 DIAGNOSIS — R0781 Pleurodynia: Secondary | ICD-10-CM | POA: Insufficient documentation

## 2021-05-09 DIAGNOSIS — F0390 Unspecified dementia without behavioral disturbance: Secondary | ICD-10-CM | POA: Diagnosis not present

## 2021-05-09 DIAGNOSIS — Z7982 Long term (current) use of aspirin: Secondary | ICD-10-CM | POA: Insufficient documentation

## 2021-05-09 DIAGNOSIS — E039 Hypothyroidism, unspecified: Secondary | ICD-10-CM | POA: Diagnosis not present

## 2021-05-09 DIAGNOSIS — S2242XA Multiple fractures of ribs, left side, initial encounter for closed fracture: Secondary | ICD-10-CM | POA: Diagnosis not present

## 2021-05-09 DIAGNOSIS — R21 Rash and other nonspecific skin eruption: Secondary | ICD-10-CM

## 2021-05-09 DIAGNOSIS — I132 Hypertensive heart and chronic kidney disease with heart failure and with stage 5 chronic kidney disease, or end stage renal disease: Secondary | ICD-10-CM | POA: Insufficient documentation

## 2021-05-09 DIAGNOSIS — Z79899 Other long term (current) drug therapy: Secondary | ICD-10-CM | POA: Insufficient documentation

## 2021-05-09 DIAGNOSIS — I2510 Atherosclerotic heart disease of native coronary artery without angina pectoris: Secondary | ICD-10-CM | POA: Diagnosis not present

## 2021-05-09 DIAGNOSIS — Z992 Dependence on renal dialysis: Secondary | ICD-10-CM | POA: Insufficient documentation

## 2021-05-09 DIAGNOSIS — Z87891 Personal history of nicotine dependence: Secondary | ICD-10-CM | POA: Diagnosis not present

## 2021-05-09 DIAGNOSIS — L299 Pruritus, unspecified: Secondary | ICD-10-CM | POA: Diagnosis not present

## 2021-05-09 DIAGNOSIS — I129 Hypertensive chronic kidney disease with stage 1 through stage 4 chronic kidney disease, or unspecified chronic kidney disease: Secondary | ICD-10-CM | POA: Diagnosis not present

## 2021-05-09 DIAGNOSIS — J984 Other disorders of lung: Secondary | ICD-10-CM | POA: Diagnosis not present

## 2021-05-09 DIAGNOSIS — I700 Atherosclerosis of aorta: Secondary | ICD-10-CM | POA: Diagnosis not present

## 2021-05-09 IMAGING — DX DG RIBS W/ CHEST 3+V*L*
3 series · 3 of 3 positions shown · non-contrast
Comparison: None.

CLINICAL DATA: Left rib pain status post fall

EXAM:
LEFT RIBS AND CHEST - 3+ VIEW

[chest ap]
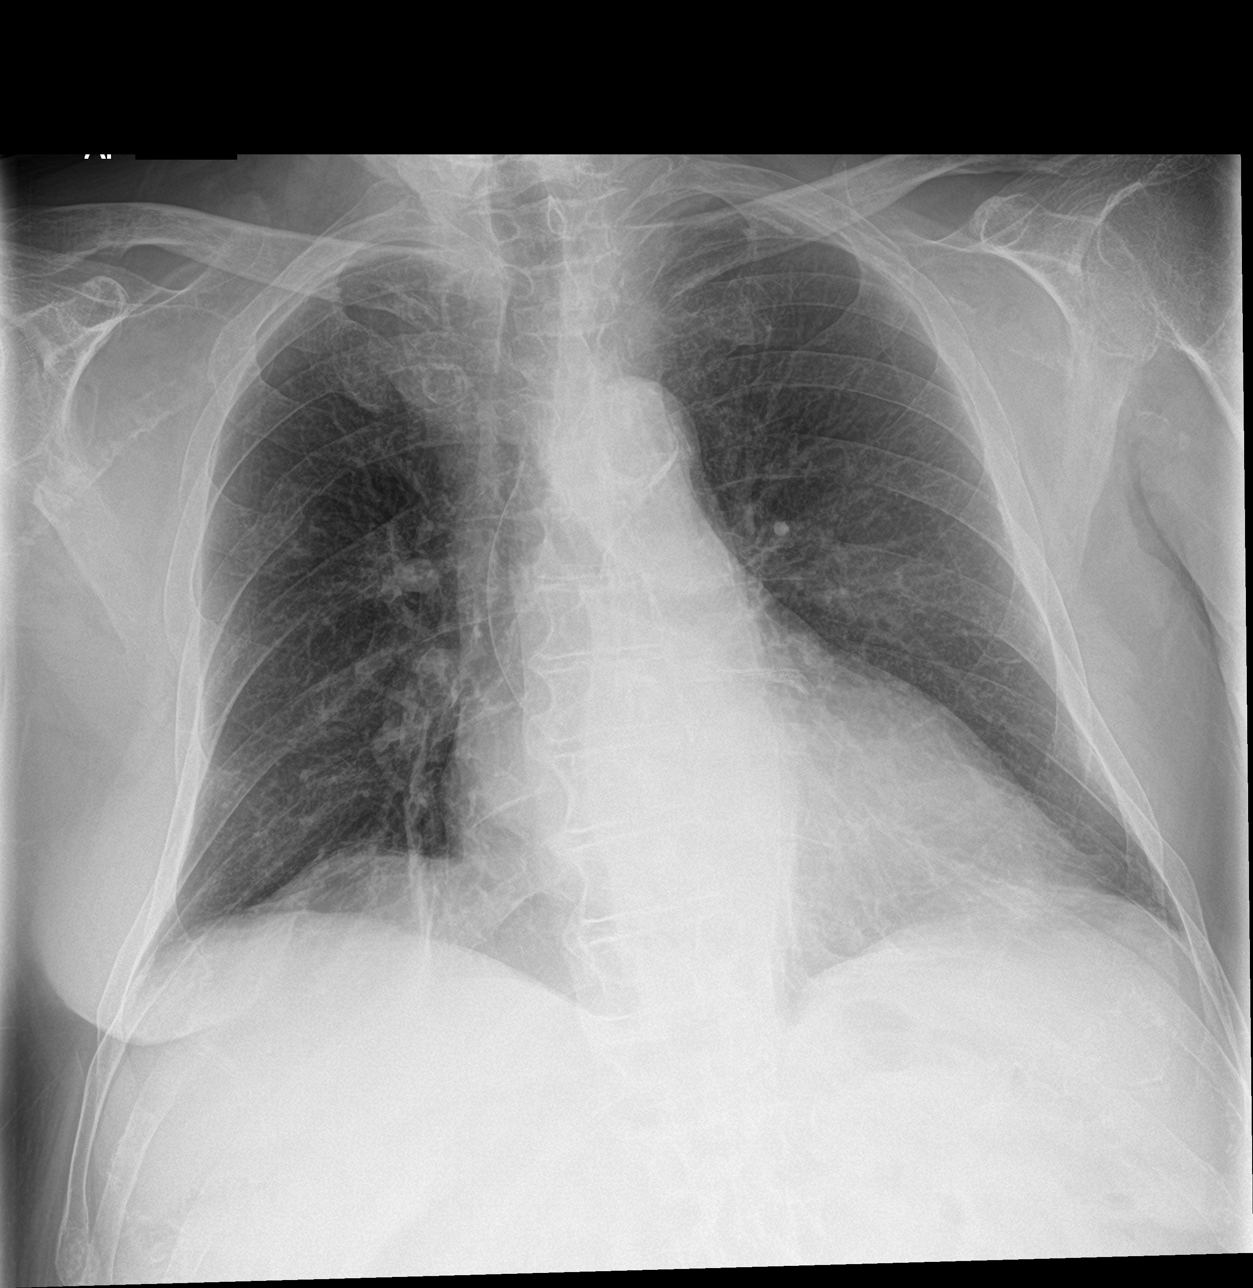

[rib ap]
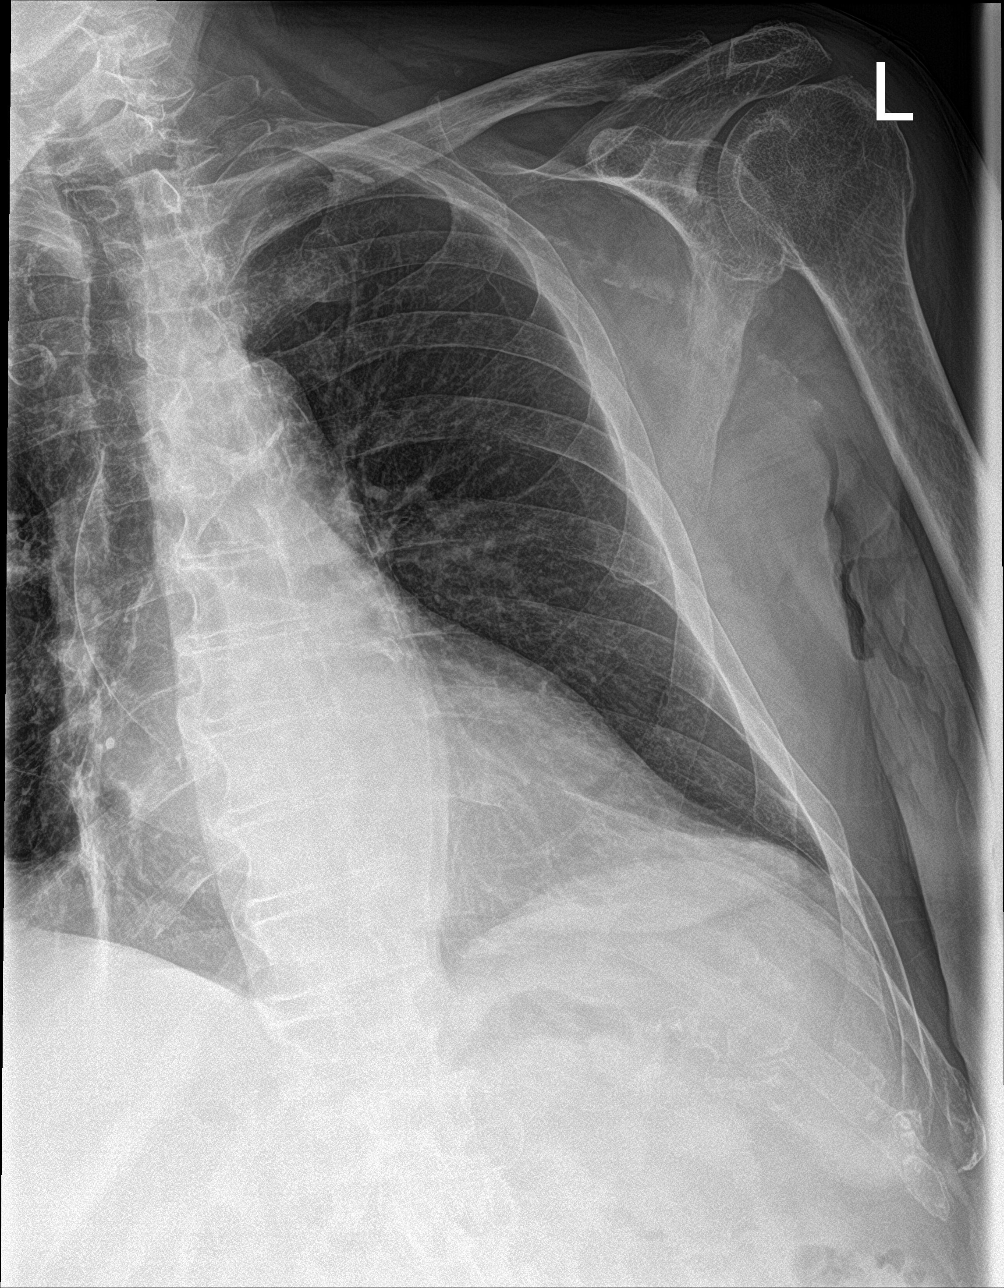

[rib ap obl]
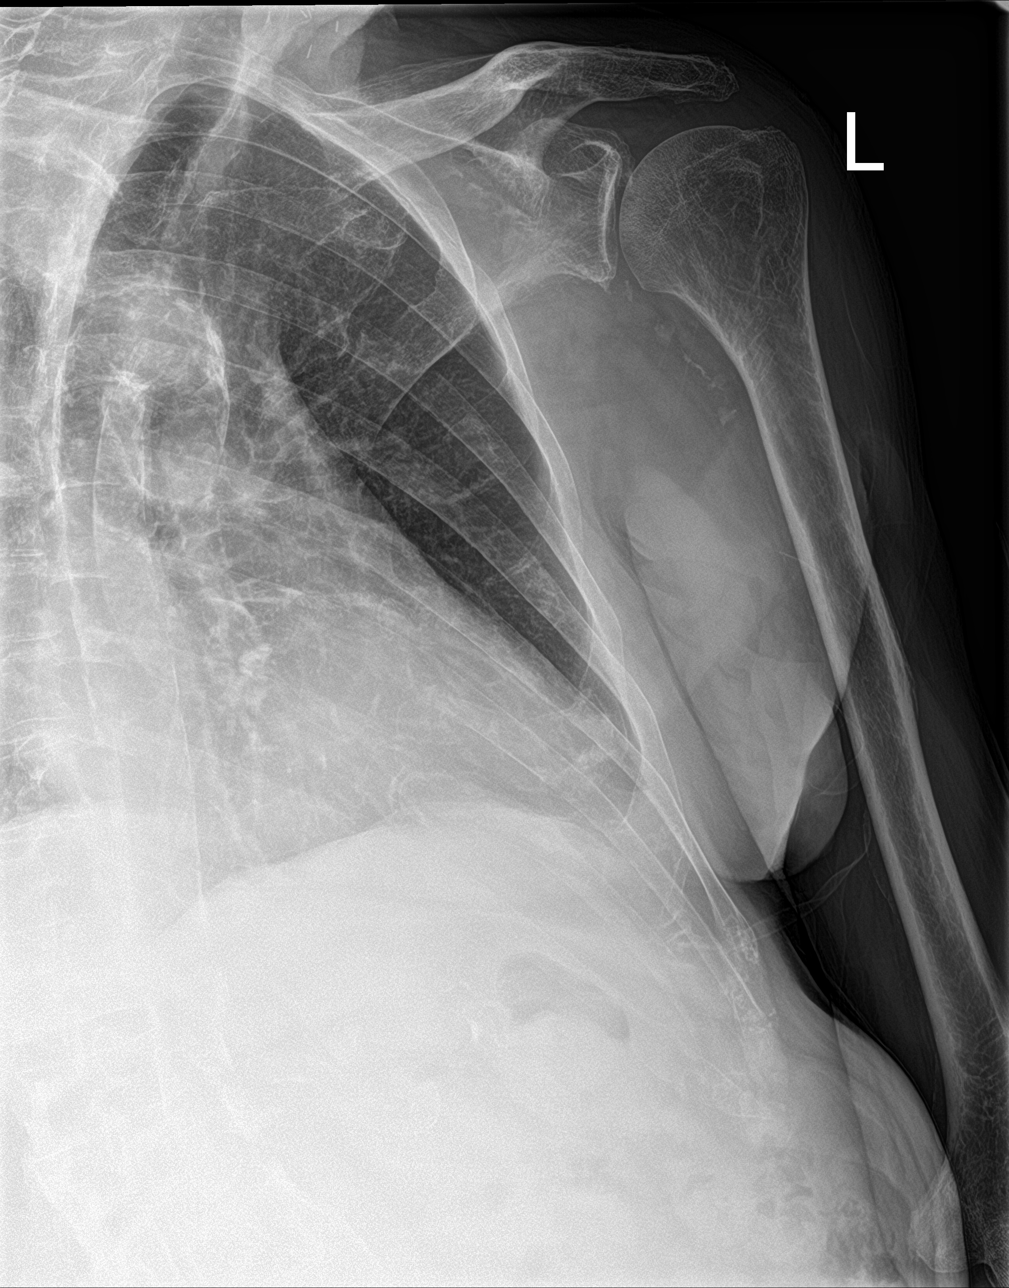

[3 of 3 positions shown; findings below may reference images not displayed]

FINDINGS: No acute displaced fracture or other bone lesions are seen involving
the left ribs. Old healed right rib fractures.

The heart and mediastinal contours are unchanged. Aortic
calcification.

Biapical pleural/pulmonary scarring. No focal consolidation. No
pulmonary edema. No pleural effusion. No pneumothorax.

No acute osseous abnormality.
IMPRESSION: 1. No acute displaced left rib fracture. Please note, nondisplaced
rib fractures may be occult on radiograph.
2. No acute cardiopulmonary abnormality.
3.  Aortic Atherosclerosis ([M3]-[M3]).

## 2021-05-09 MED ORDER — VALACYCLOVIR HCL 1 G PO TABS: 1000.0000 mg | ORAL_TABLET | Freq: Three times a day (TID) | ORAL | 0 refills | Status: DC
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
## 2021-05-09 NOTE — Discharge Instructions (Addendum)
It was a pleasure taking care of you today.  As discussed, your chest x-ray did not show any rib fractures.  You may take over the counter ibuprofen or tylenol as needed for rib pain. You will need to see a dermatologist in regards to the rash on your right ear.  Continue using the clindamycin gel as previously prescribed.  Follow-up with PCP within 1 week.  Return to the ER for new or worsening symptoms.  Please also make an appointment with ENT with the [facility] for further evaluation.

## 2021-05-09 NOTE — ED Triage Notes (Signed)
Pt. Stated, Donnald Garre had this rash all on my rt. Ear for 3-4 months.

## 2021-05-10 DIAGNOSIS — R52 Pain, unspecified: Secondary | ICD-10-CM | POA: Diagnosis not present

## 2021-05-10 DIAGNOSIS — E875 Hyperkalemia: Secondary | ICD-10-CM | POA: Diagnosis not present

## 2021-05-10 DIAGNOSIS — Z992 Dependence on renal dialysis: Secondary | ICD-10-CM | POA: Diagnosis not present

## 2021-05-10 DIAGNOSIS — N186 End stage renal disease: Secondary | ICD-10-CM | POA: Diagnosis not present

## 2021-05-10 DIAGNOSIS — N2581 Secondary hyperparathyroidism of renal origin: Secondary | ICD-10-CM | POA: Diagnosis not present

## 2021-05-11 ENCOUNTER — Inpatient Hospital Stay (HOSPITAL_COMMUNITY)
Admission: EM | Admit: 2021-05-11 | Discharge: 2021-05-13 | DRG: 070 | Disposition: A | Discharge status: Hospice | Payer: No Typology Code available for payment source | Attending: Internal Medicine | Admitting: Internal Medicine

## 2021-05-11 ENCOUNTER — Emergency Department (HOSPITAL_COMMUNITY): Payer: No Typology Code available for payment source

## 2021-05-11 ENCOUNTER — Observation Stay (HOSPITAL_COMMUNITY): Payer: No Typology Code available for payment source

## 2021-05-11 ENCOUNTER — Encounter (HOSPITAL_COMMUNITY): Payer: Self-pay | Admitting: *Deleted

## 2021-05-11 ENCOUNTER — Encounter (HOSPITAL_COMMUNITY): Payer: Self-pay | Admitting: Emergency Medicine

## 2021-05-11 ENCOUNTER — Other Ambulatory Visit: Payer: Self-pay

## 2021-05-11 DIAGNOSIS — R270 Ataxia, unspecified: Secondary | ICD-10-CM | POA: Diagnosis present

## 2021-05-11 DIAGNOSIS — Z7982 Long term (current) use of aspirin: Secondary | ICD-10-CM

## 2021-05-11 DIAGNOSIS — G9340 Encephalopathy, unspecified: Secondary | ICD-10-CM | POA: Diagnosis not present

## 2021-05-11 DIAGNOSIS — R296 Repeated falls: Secondary | ICD-10-CM | POA: Diagnosis present

## 2021-05-11 DIAGNOSIS — F418 Other specified anxiety disorders: Secondary | ICD-10-CM | POA: Diagnosis present

## 2021-05-11 DIAGNOSIS — Z7902 Long term (current) use of antithrombotics/antiplatelets: Secondary | ICD-10-CM

## 2021-05-11 DIAGNOSIS — Z8371 Family history of colonic polyps: Secondary | ICD-10-CM

## 2021-05-11 DIAGNOSIS — E875 Hyperkalemia: Secondary | ICD-10-CM | POA: Diagnosis present

## 2021-05-11 DIAGNOSIS — I160 Hypertensive urgency: Secondary | ICD-10-CM | POA: Diagnosis present

## 2021-05-11 DIAGNOSIS — Z20822 Contact with and (suspected) exposure to covid-19: Secondary | ICD-10-CM | POA: Diagnosis present

## 2021-05-11 DIAGNOSIS — I480 Paroxysmal atrial fibrillation: Secondary | ICD-10-CM | POA: Diagnosis present

## 2021-05-11 DIAGNOSIS — E785 Hyperlipidemia, unspecified: Secondary | ICD-10-CM | POA: Diagnosis present

## 2021-05-11 DIAGNOSIS — H547 Unspecified visual loss: Secondary | ICD-10-CM | POA: Diagnosis present

## 2021-05-11 DIAGNOSIS — I5042 Chronic combined systolic (congestive) and diastolic (congestive) heart failure: Secondary | ICD-10-CM | POA: Diagnosis present

## 2021-05-11 DIAGNOSIS — N186 End stage renal disease: Secondary | ICD-10-CM | POA: Diagnosis present

## 2021-05-11 DIAGNOSIS — E039 Hypothyroidism, unspecified: Secondary | ICD-10-CM | POA: Diagnosis present

## 2021-05-11 DIAGNOSIS — Z8673 Personal history of transient ischemic attack (TIA), and cerebral infarction without residual deficits: Secondary | ICD-10-CM | POA: Diagnosis not present

## 2021-05-11 DIAGNOSIS — F40240 Claustrophobia: Secondary | ICD-10-CM | POA: Diagnosis present

## 2021-05-11 DIAGNOSIS — D509 Iron deficiency anemia, unspecified: Secondary | ICD-10-CM | POA: Diagnosis present

## 2021-05-11 DIAGNOSIS — Z79899 Other long term (current) drug therapy: Secondary | ICD-10-CM

## 2021-05-11 DIAGNOSIS — I132 Hypertensive heart and chronic kidney disease with heart failure and with stage 5 chronic kidney disease, or end stage renal disease: Secondary | ICD-10-CM | POA: Diagnosis present

## 2021-05-11 DIAGNOSIS — G9341 Metabolic encephalopathy: Principal | ICD-10-CM | POA: Diagnosis present

## 2021-05-11 DIAGNOSIS — R29818 Other symptoms and signs involving the nervous system: Secondary | ICD-10-CM | POA: Diagnosis not present

## 2021-05-11 DIAGNOSIS — R4781 Slurred speech: Secondary | ICD-10-CM | POA: Diagnosis present

## 2021-05-11 DIAGNOSIS — Z955 Presence of coronary angioplasty implant and graft: Secondary | ICD-10-CM

## 2021-05-11 DIAGNOSIS — Z87891 Personal history of nicotine dependence: Secondary | ICD-10-CM

## 2021-05-11 DIAGNOSIS — I252 Old myocardial infarction: Secondary | ICD-10-CM

## 2021-05-11 DIAGNOSIS — Z9841 Cataract extraction status, right eye: Secondary | ICD-10-CM

## 2021-05-11 DIAGNOSIS — I2510 Atherosclerotic heart disease of native coronary artery without angina pectoris: Secondary | ICD-10-CM | POA: Diagnosis present

## 2021-05-11 DIAGNOSIS — Z79890 Hormone replacement therapy (postmenopausal): Secondary | ICD-10-CM

## 2021-05-11 DIAGNOSIS — G459 Transient cerebral ischemic attack, unspecified: Secondary | ICD-10-CM | POA: Diagnosis not present

## 2021-05-11 DIAGNOSIS — Z515 Encounter for palliative care: Secondary | ICD-10-CM

## 2021-05-11 DIAGNOSIS — R627 Adult failure to thrive: Secondary | ICD-10-CM | POA: Diagnosis present

## 2021-05-11 DIAGNOSIS — R4182 Altered mental status, unspecified: Secondary | ICD-10-CM | POA: Diagnosis present

## 2021-05-11 DIAGNOSIS — Z8249 Family history of ischemic heart disease and other diseases of the circulatory system: Secondary | ICD-10-CM

## 2021-05-11 DIAGNOSIS — Z887 Allergy status to serum and vaccine status: Secondary | ICD-10-CM

## 2021-05-11 DIAGNOSIS — Z961 Presence of intraocular lens: Secondary | ICD-10-CM | POA: Diagnosis present

## 2021-05-11 DIAGNOSIS — I714 Abdominal aortic aneurysm, without rupture, unspecified: Secondary | ICD-10-CM | POA: Diagnosis present

## 2021-05-11 DIAGNOSIS — Z9842 Cataract extraction status, left eye: Secondary | ICD-10-CM

## 2021-05-11 DIAGNOSIS — I255 Ischemic cardiomyopathy: Secondary | ICD-10-CM | POA: Diagnosis present

## 2021-05-11 DIAGNOSIS — R200 Anesthesia of skin: Secondary | ICD-10-CM | POA: Diagnosis not present

## 2021-05-11 DIAGNOSIS — R29810 Facial weakness: Secondary | ICD-10-CM | POA: Diagnosis present

## 2021-05-11 DIAGNOSIS — Z992 Dependence on renal dialysis: Secondary | ICD-10-CM

## 2021-05-11 DIAGNOSIS — R471 Dysarthria and anarthria: Secondary | ICD-10-CM | POA: Diagnosis present

## 2021-05-11 DIAGNOSIS — Z881 Allergy status to other antibiotic agents status: Secondary | ICD-10-CM

## 2021-05-11 DIAGNOSIS — K219 Gastro-esophageal reflux disease without esophagitis: Secondary | ICD-10-CM | POA: Diagnosis present

## 2021-05-11 DIAGNOSIS — Z87442 Personal history of urinary calculi: Secondary | ICD-10-CM

## 2021-05-11 DIAGNOSIS — Z880 Allergy status to penicillin: Secondary | ICD-10-CM

## 2021-05-11 DIAGNOSIS — Z66 Do not resuscitate: Secondary | ICD-10-CM | POA: Diagnosis present

## 2021-05-11 DIAGNOSIS — I6521 Occlusion and stenosis of right carotid artery: Secondary | ICD-10-CM | POA: Diagnosis present

## 2021-05-11 DIAGNOSIS — F0393 Unspecified dementia, unspecified severity, with mood disturbance: Secondary | ICD-10-CM | POA: Diagnosis present

## 2021-05-11 DIAGNOSIS — R34 Anuria and oliguria: Secondary | ICD-10-CM | POA: Diagnosis present

## 2021-05-11 LAB — COMPREHENSIVE METABOLIC PANEL
ALT: 18 U/L (ref 0–44)
AST: 25 U/L (ref 15–41)
Albumin: 3.3 g/dL — ABNORMAL LOW (ref 3.5–5.0)
Alkaline Phosphatase: 96 U/L (ref 38–126)
Anion gap: 13 (ref 5–15)
BUN: 28 mg/dL — ABNORMAL HIGH (ref 8–23)
CO2: 29 mmol/L (ref 22–32)
Calcium: 9.2 mg/dL (ref 8.9–10.3)
Chloride: 96 mmol/L — ABNORMAL LOW (ref 98–111)
Creatinine, Ser: 6.34 mg/dL — ABNORMAL HIGH (ref 0.61–1.24)
GFR, Estimated: 8 mL/min — ABNORMAL LOW (ref >60)
Glucose, Bld: 91 mg/dL (ref 70–99)
Potassium: 5.4 mmol/L — ABNORMAL HIGH (ref 3.5–5.1)
Sodium: 138 mmol/L (ref 135–145)
Total Bilirubin: 0.7 mg/dL (ref 0.3–1.2)
Total Protein: 7.1 g/dL (ref 6.5–8.1)

## 2021-05-11 LAB — CBC
HCT: 35.8 % — ABNORMAL LOW (ref 39.0–52.0)
Hemoglobin: 11.8 g/dL — ABNORMAL LOW (ref 13.0–17.0)
MCH: 34.9 pg — ABNORMAL HIGH (ref 26.0–34.0)
MCHC: 33 g/dL (ref 30.0–36.0)
MCV: 105.9 fL — ABNORMAL HIGH (ref 80.0–100.0)
Platelets: 188 10*3/uL (ref 150–400)
RBC: 3.38 MIL/uL — ABNORMAL LOW (ref 4.22–5.81)
RDW: 13.7 % (ref 11.5–15.5)
WBC: 8.6 10*3/uL (ref 4.0–10.5)
nRBC: 0 % (ref 0.0–0.2)

## 2021-05-11 LAB — PROTIME-INR
INR: 1.1 (ref 0.8–1.2)
Prothrombin Time: 14.4 seconds (ref 11.4–15.2)

## 2021-05-11 LAB — APTT: aPTT: 32 seconds (ref 24–36)

## 2021-05-11 LAB — HEMOGLOBIN A1C
Hgb A1c MFr Bld: 5.3 % (ref 4.8–5.6)
Mean Plasma Glucose: 105.41 mg/dL

## 2021-05-11 LAB — ETHANOL: Alcohol, Ethyl (B): <10 mg/dL (ref <10)

## 2021-05-11 LAB — TSH: TSH: 6.062 u[IU]/mL — ABNORMAL HIGH (ref 0.350–4.500)

## 2021-05-11 IMAGING — MR MR MRA NECK W/O CM
1 of 3 series · 19 of 48 positions shown · non-contrast
Comparison: None.

CLINICAL DATA: TIA

EXAM:
MRA NECK WITHOUT CONTRAST
MRA HEAD WITHOUT CONTRAST
TECHNIQUE: Angiographic images of the Circle of Willis were acquired using MRA
technique without intravenous contrast.

[Series 4: sag inhance (id) · sagittal · 1.2mm · 0.47mm/px · 19 of 357 slices shown]
[im 1/357]
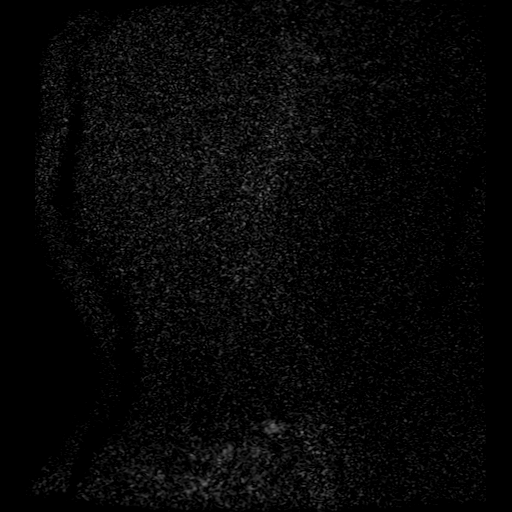
[im 12/357]
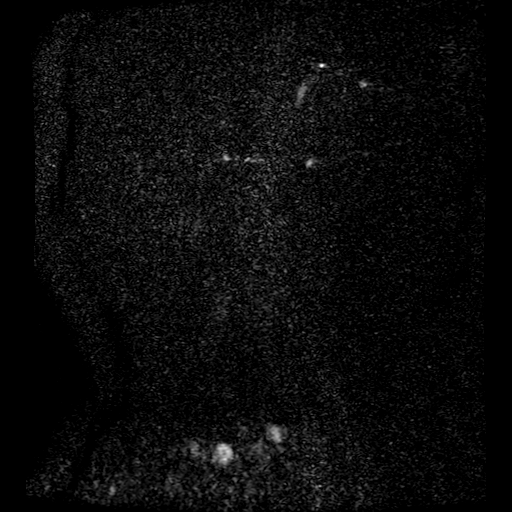
[im 23/357]
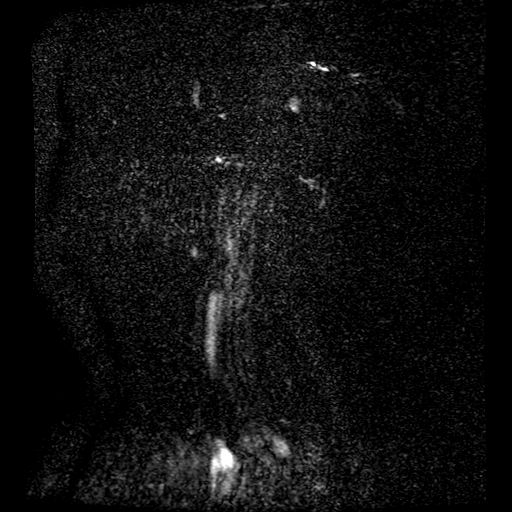
[im 34/357]
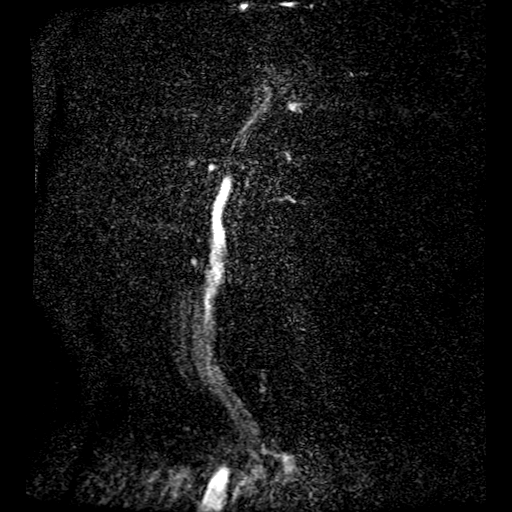
[im 45/357]
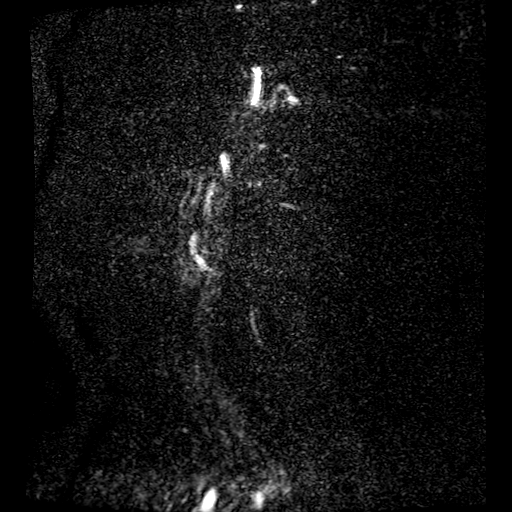
[im 56/357]
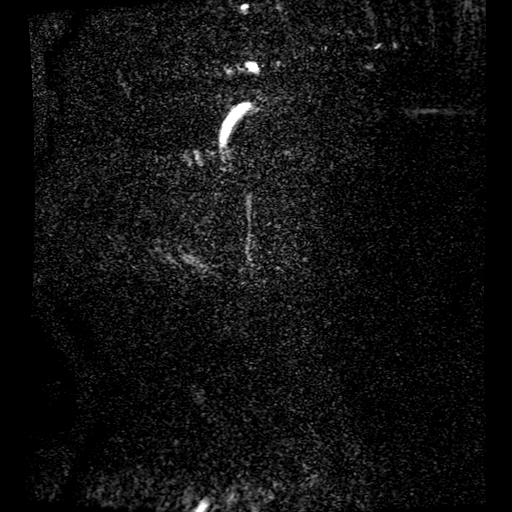
[im 67/357]
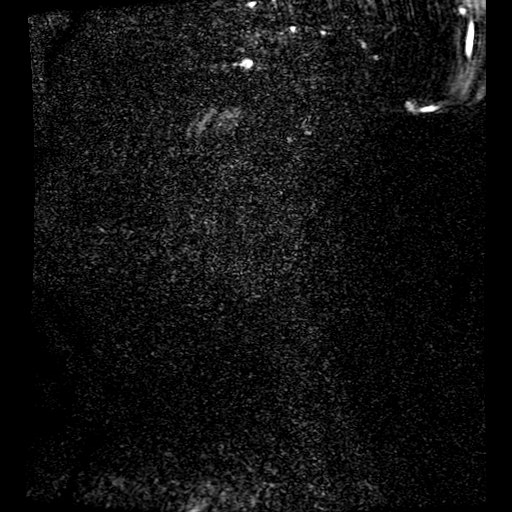
[im 78/357]
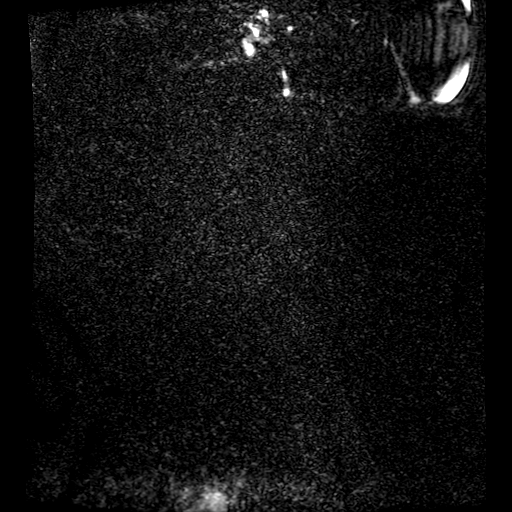
[im 90/357]
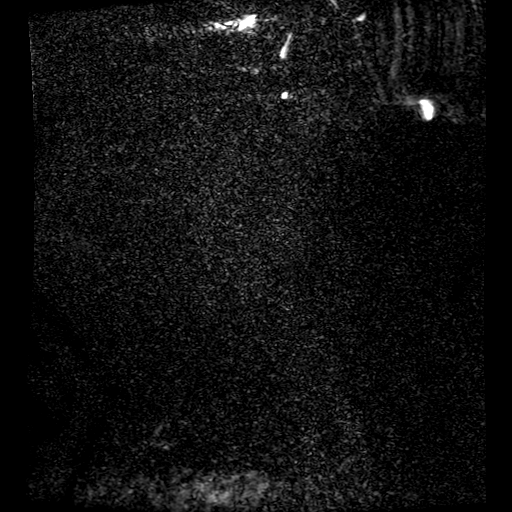
[im 101/357]
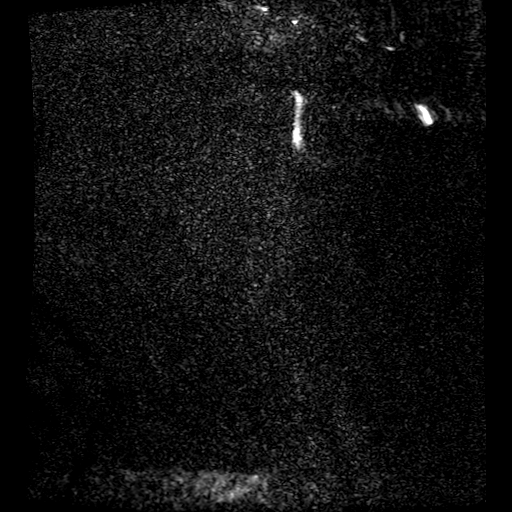
[im 112/357]
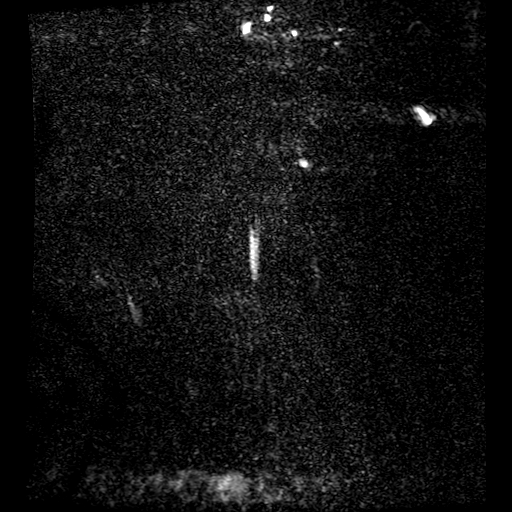
[im 123/357]
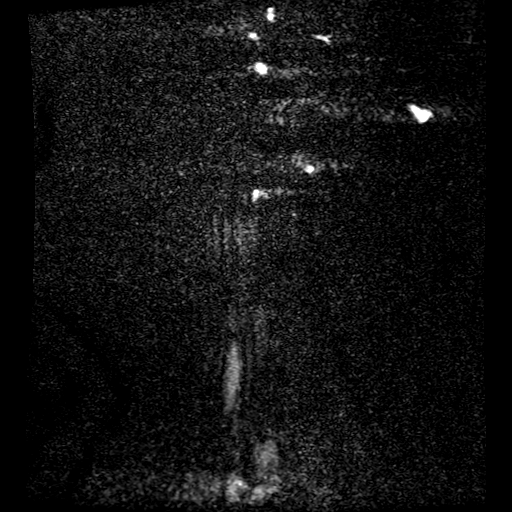
[im 156/357]
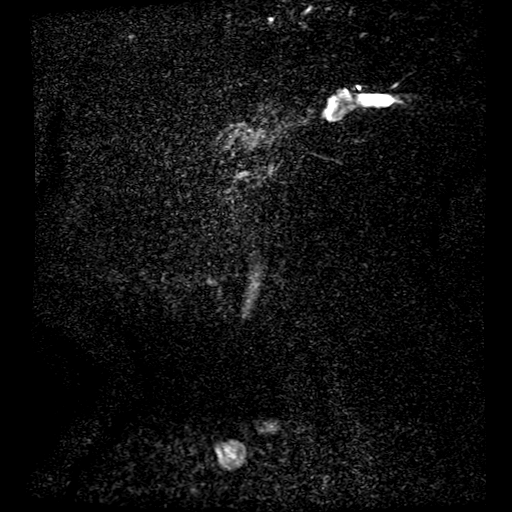
[im 179/357]
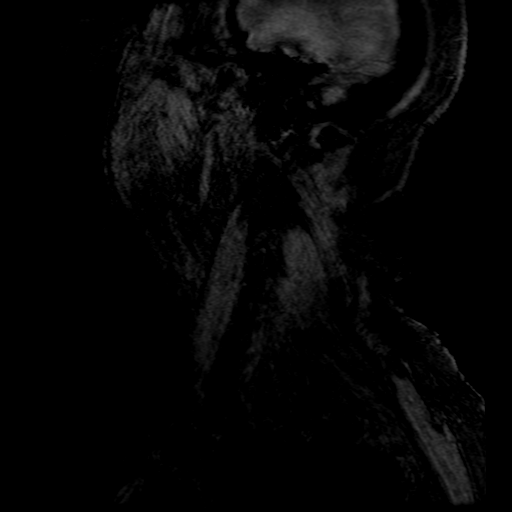
[im 201/357]
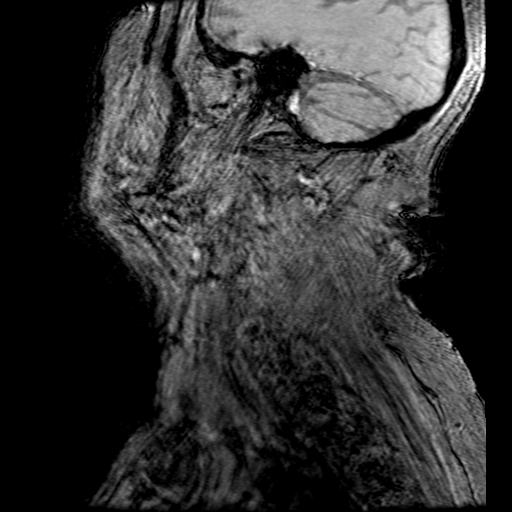
[im 245/357]
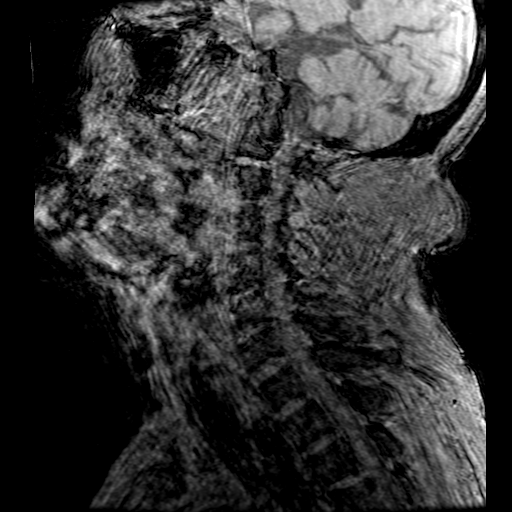
[im 290/357]
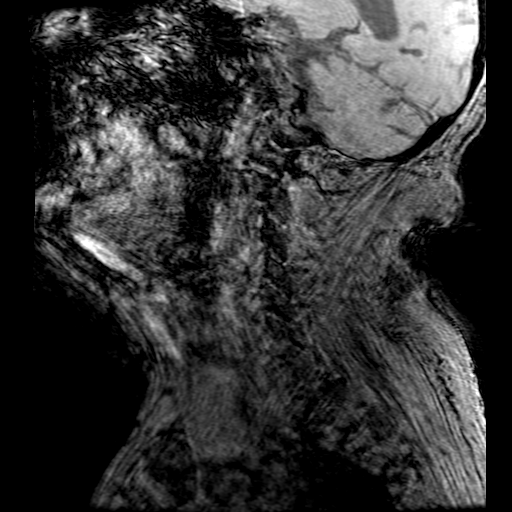
[im 301/357]
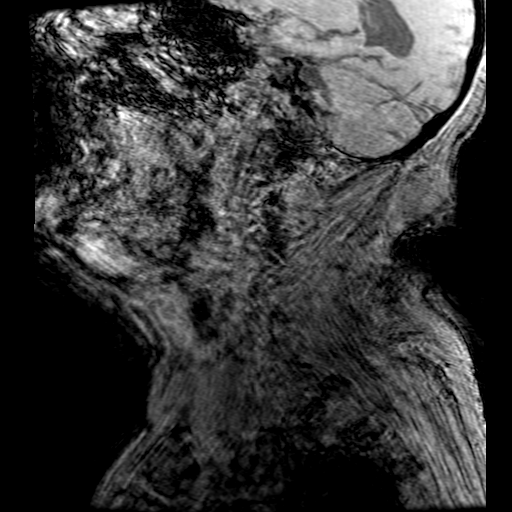
[im 334/357]
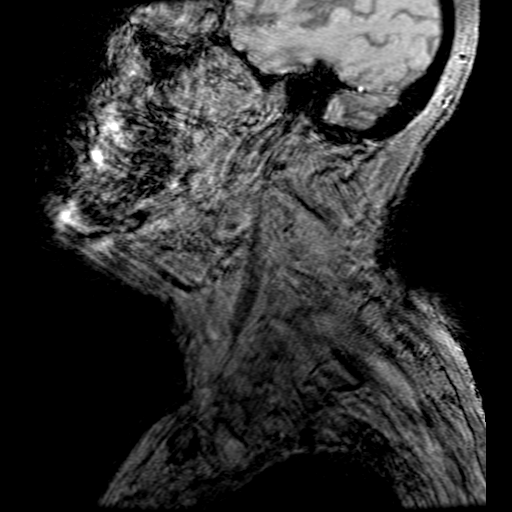

[19 of 48 positions shown; findings below may reference images not displayed]

FINDINGS: MRA NECK FINDINGS

Time-of-flight imaging of the carotid and vertebral systems shows
antegrade flow. There is narrowing of the right internal carotid
artery is of approximately 50% by NASCET criteria. The vertebral
system is right dominant.

MRA HEAD FINDINGS

POSTERIOR CIRCULATION:

--Vertebral arteries: Normal

--Inferior cerebellar arteries: Normal.

--Basilar artery: Normal.

--Superior cerebellar arteries: Normal.

--Posterior cerebral arteries: Normal.

ANTERIOR CIRCULATION:

--Intracranial internal carotid arteries: Normal.

--Anterior cerebral arteries (ACA): Normal.

--Middle cerebral arteries (MCA): Normal.

ANATOMIC VARIANTS: Fetal origin of the left PCA.
IMPRESSION: 1. No emergent large vessel occlusion or high-grade stenosis.
2. Approximately 50% stenosis of the right internal carotid artery.

## 2021-05-11 IMAGING — MR MR MRA HEAD W/O CM
2 series · 19 of 48 positions shown · non-contrast
Comparison: None.

CLINICAL DATA: TIA

EXAM:
MRA NECK WITHOUT CONTRAST
MRA HEAD WITHOUT CONTRAST
TECHNIQUE: Angiographic images of the Circle of Willis were acquired using MRA
technique without intravenous contrast.

[Series 3: ax (id) · axial · 1.0mm · 0.43mm/px · z∈[-30,+53]mm · 18 of 176 slices shown]
[im 1/176]
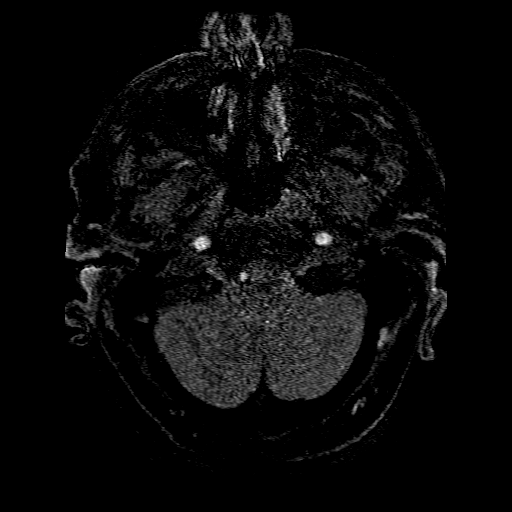
[im 4/176]
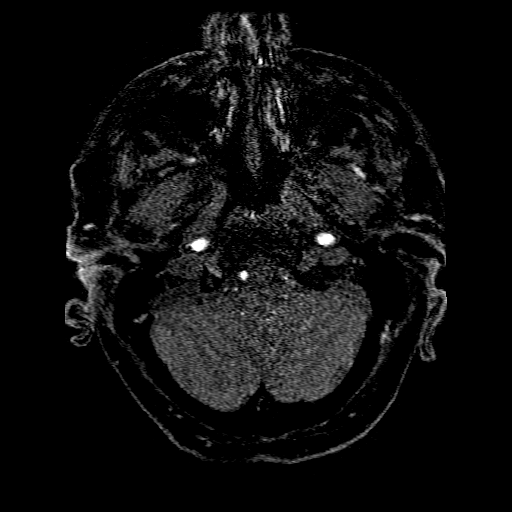
[im 8/176]
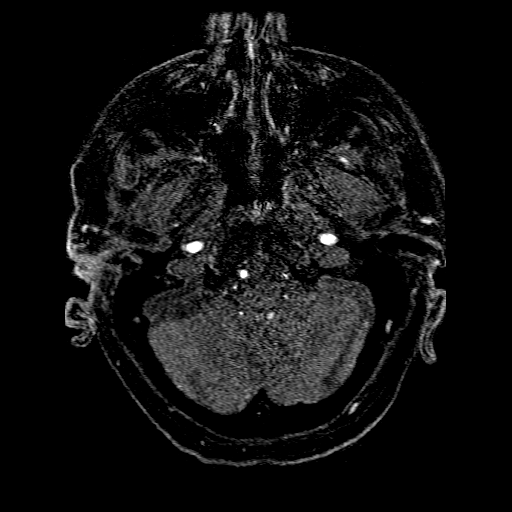
[im 12/176]
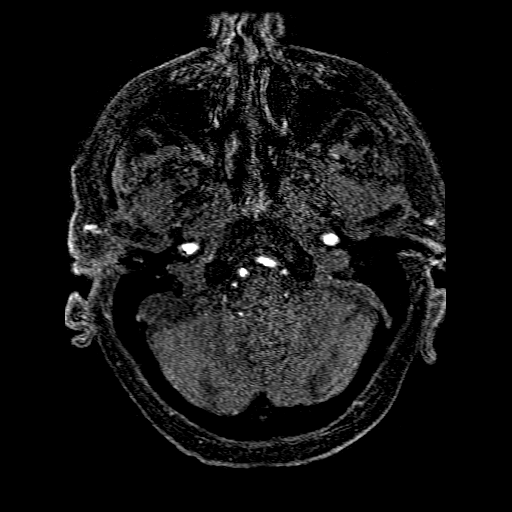
[im 16/176]
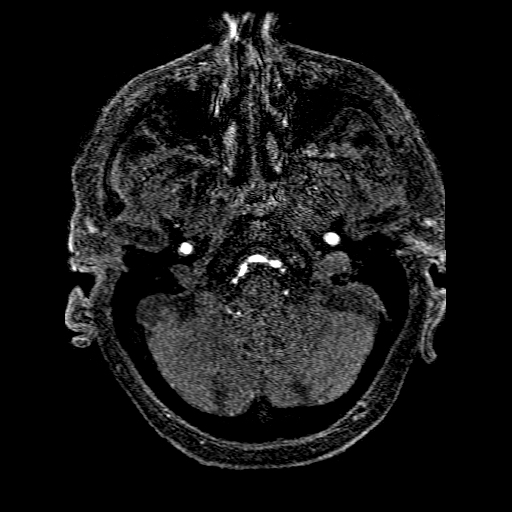
[im 20/176]
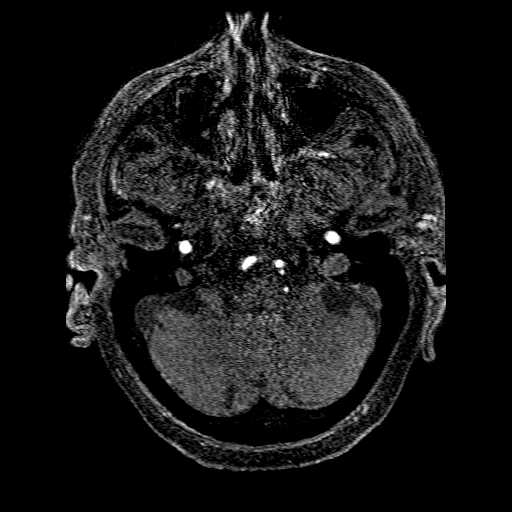
[im 23/176]
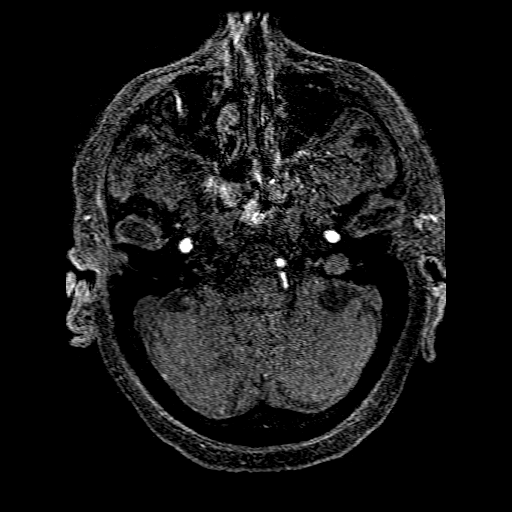
[im 27/176]
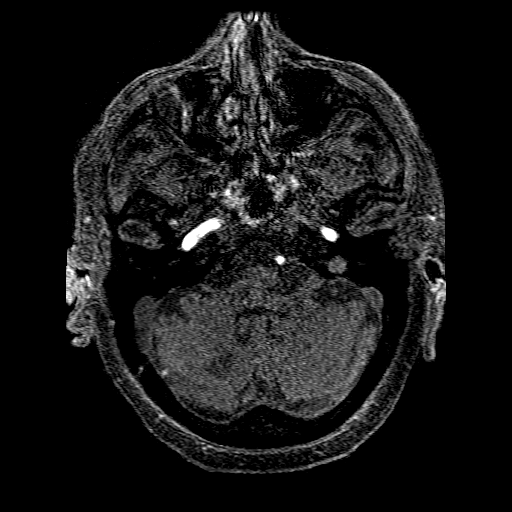
[im 31/176]
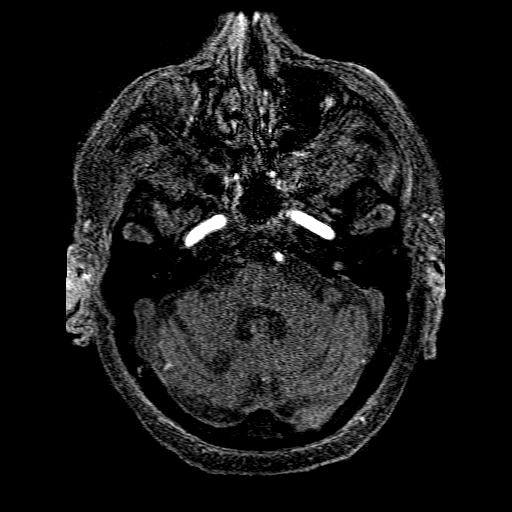
[im 35/176]
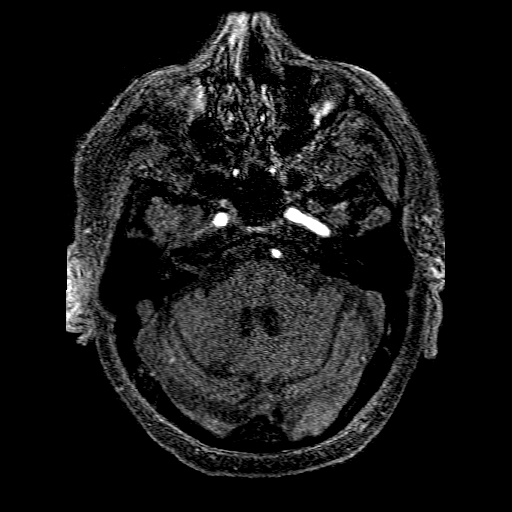
[im 54/176]
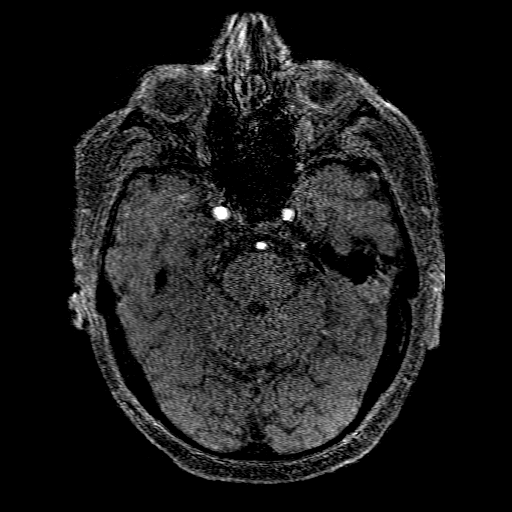
[im 77/176]
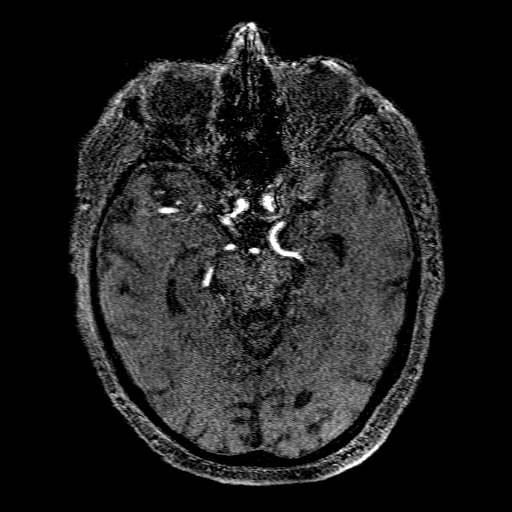
[im 88/176]
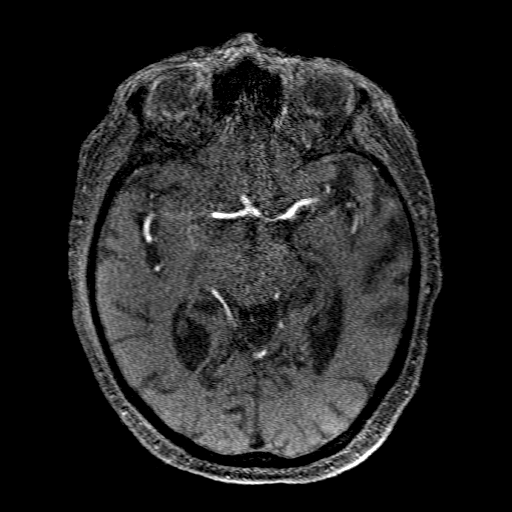
[im 99/176]
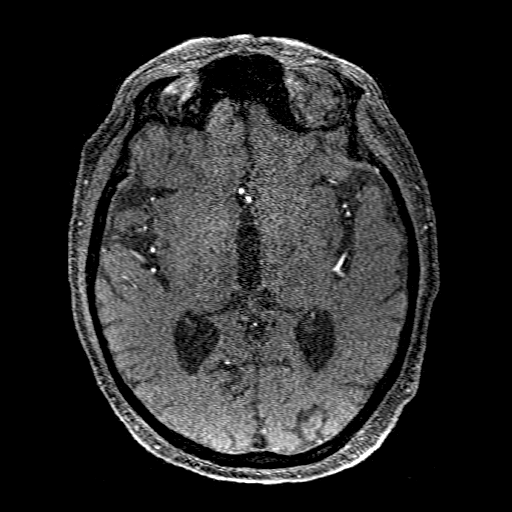
[im 122/176]
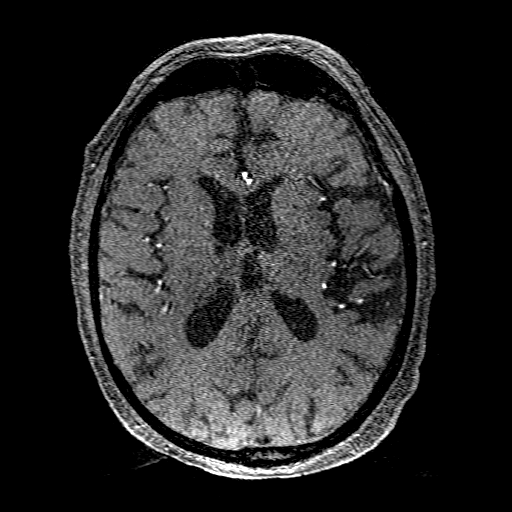
[im 145/176]
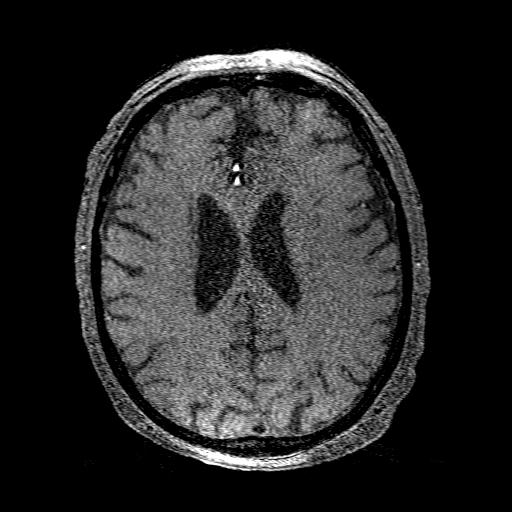
[im 149/176]
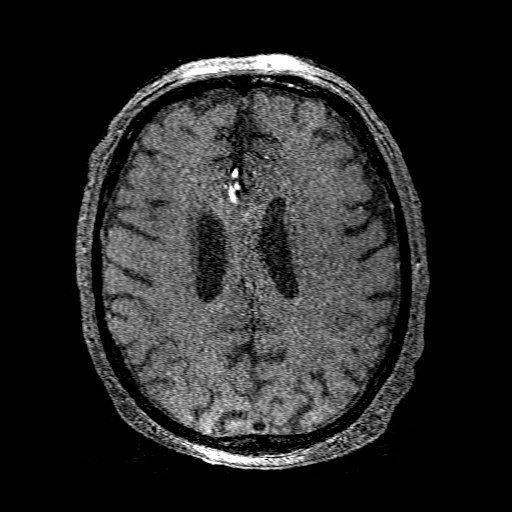
[im 168/176]
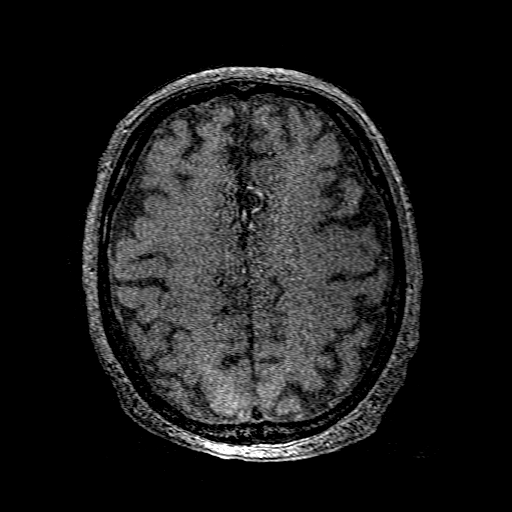

[Series 301: pjn:ax (id) · sagittal · 1.0mm · 0.43mm/px · 1 of 4 slices shown]
[im 1/4]
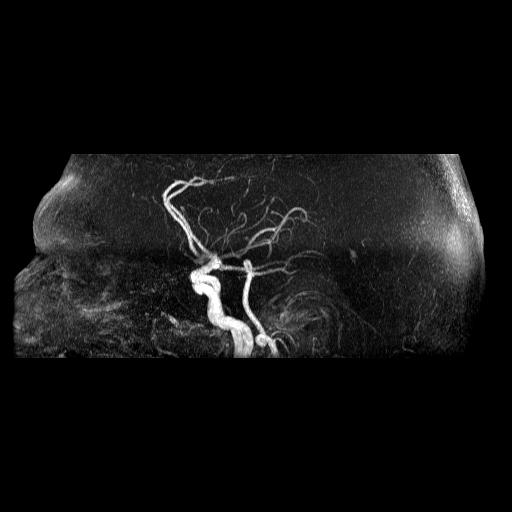

[19 of 48 positions shown; findings below may reference images not displayed]

FINDINGS: MRA NECK FINDINGS

Time-of-flight imaging of the carotid and vertebral systems shows
antegrade flow. There is narrowing of the right internal carotid
artery is of approximately 50% by NASCET criteria. The vertebral
system is right dominant.

MRA HEAD FINDINGS

POSTERIOR CIRCULATION:

--Vertebral arteries: Normal

--Inferior cerebellar arteries: Normal.

--Basilar artery: Normal.

--Superior cerebellar arteries: Normal.

--Posterior cerebral arteries: Normal.

ANTERIOR CIRCULATION:

--Intracranial internal carotid arteries: Normal.

--Anterior cerebral arteries (ACA): Normal.

--Middle cerebral arteries (MCA): Normal.

ANATOMIC VARIANTS: Fetal origin of the left PCA.
IMPRESSION: 1. No emergent large vessel occlusion or high-grade stenosis.
2. Approximately 50% stenosis of the right internal carotid artery.

## 2021-05-11 IMAGING — CT CT HEAD W/O CM
3 of 4 series · 15 of 47 positions shown, 18 images · non-contrast
Comparison: CT head [DATE].

CLINICAL DATA: Transient ischemic attack (TIA)

EXAM:
CT HEAD WITHOUT CONTRAST
TECHNIQUE: Contiguous axial images were obtained from the base of the skull
through the vertex without intravenous contrast.

[Series 4: head 2.0 h70h · axial · 0.44mm/px · z∈[-139,-9]mm · 9 of 83 slices shown, 12 images]
[im 9/83  brain]
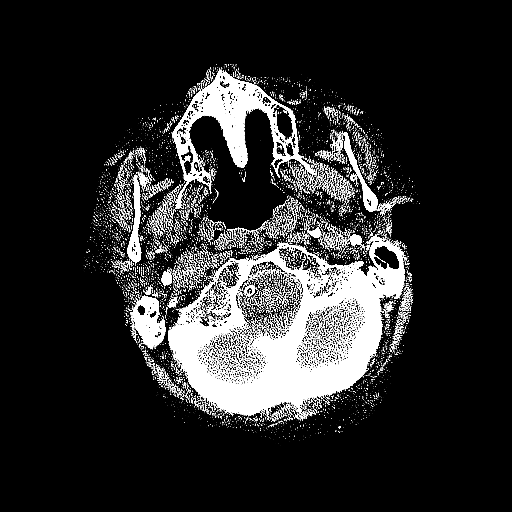
[im 9/83  bone]
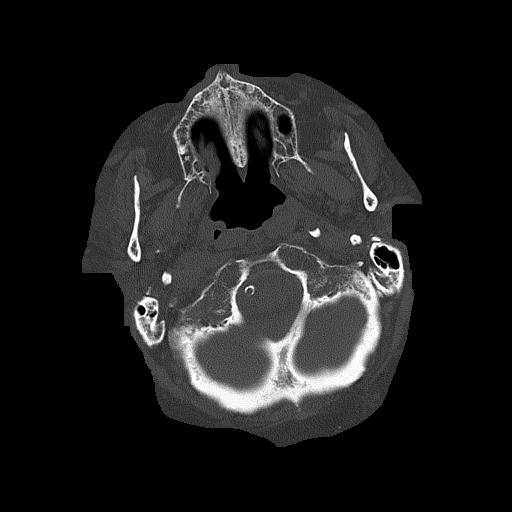
[im 17/83  brain]
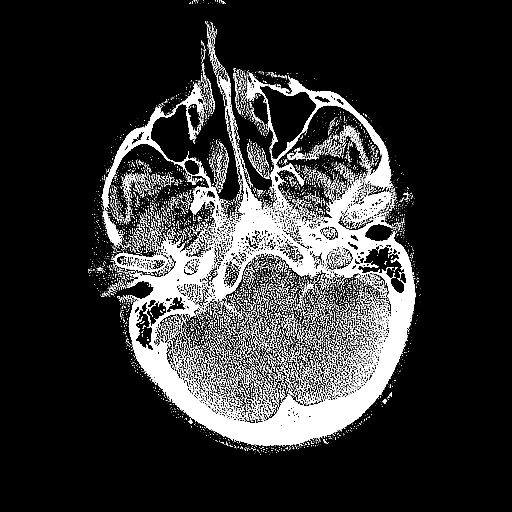
[im 25/83  brain]
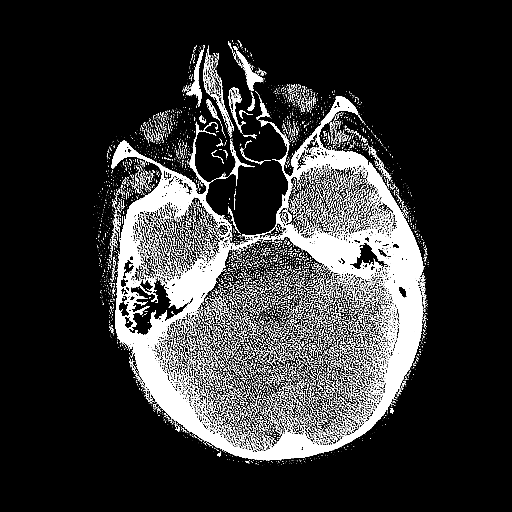
[im 33/83  brain]
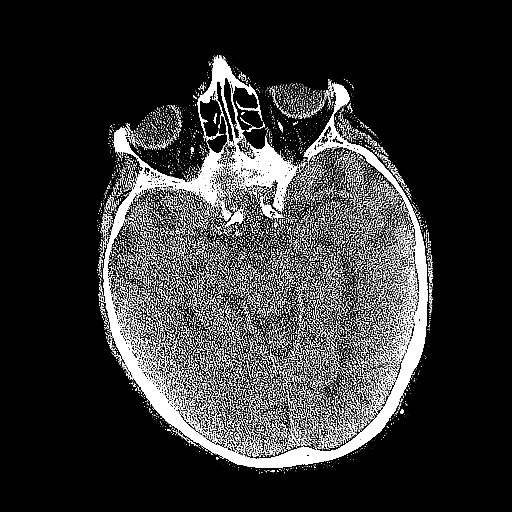
[im 42/83  brain]
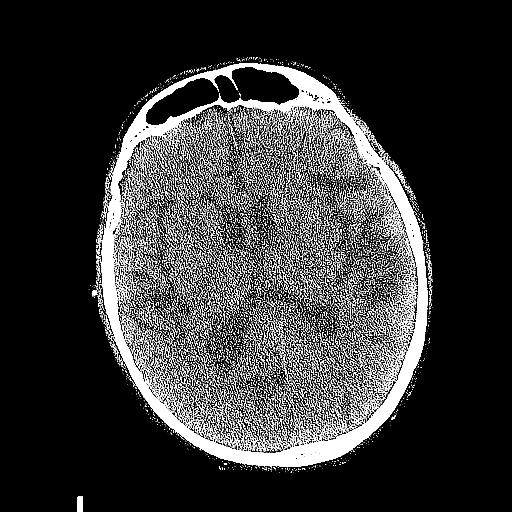
[im 42/83  bone]
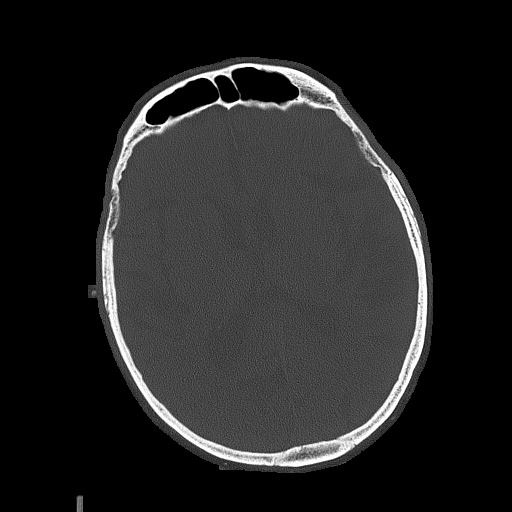
[im 50/83  brain]
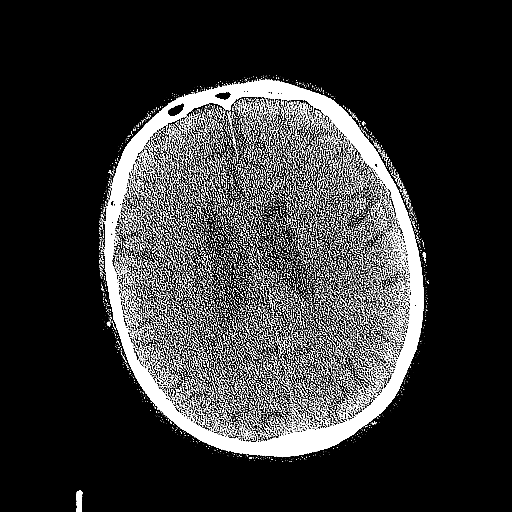
[im 58/83  brain]
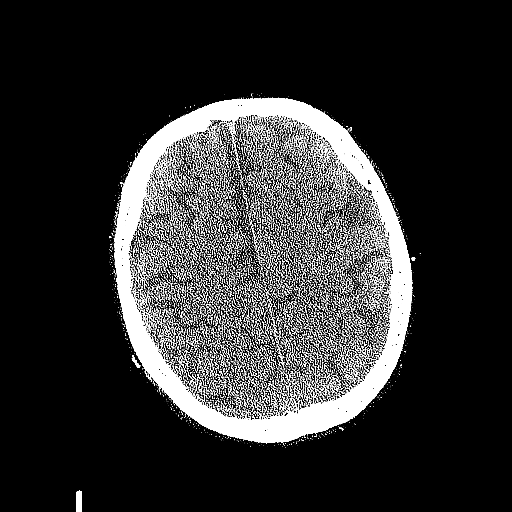
[im 66/83  brain]
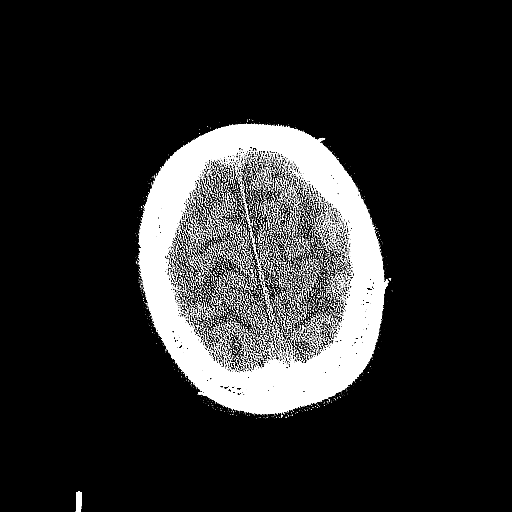
[im 74/83  brain]
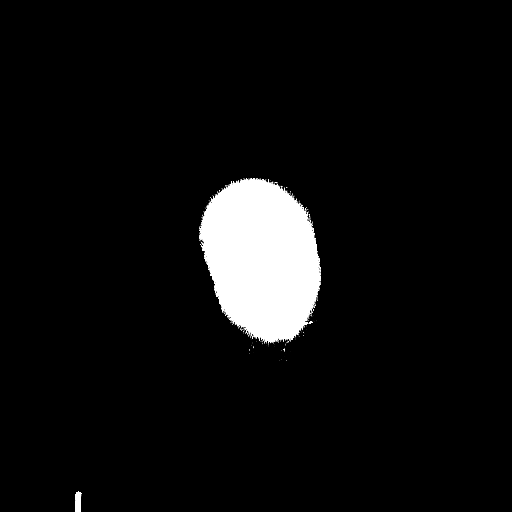
[im 74/83  bone]
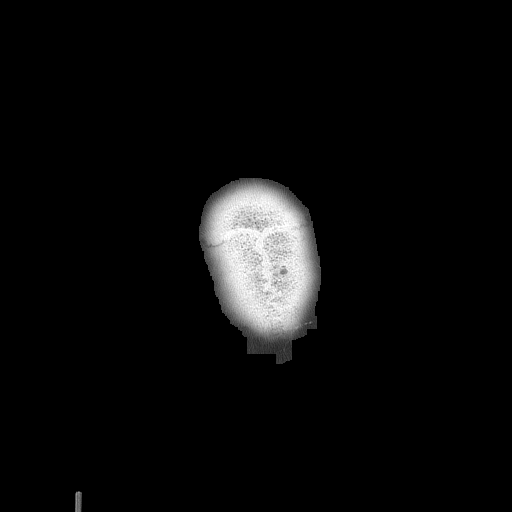

[Series 5: head 3.0 mpr cor · coronal · 0.31mm/px · 3 of 67 slices shown]
[im 23/67  brain]
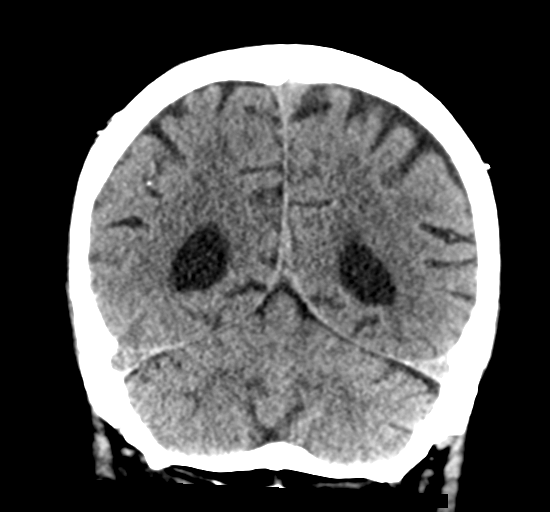
[im 30/67  brain]
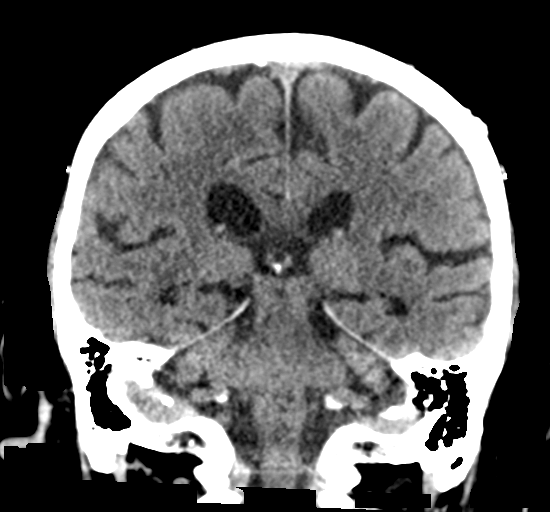
[im 37/67  brain]
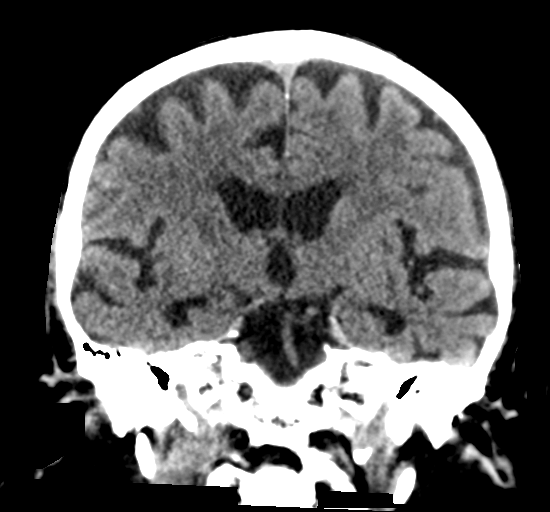

[Series 6: head 3.0 mpr sag · sagittal · 0.31mm/px · 3 of 57 slices shown]
[im 19/57  brain]
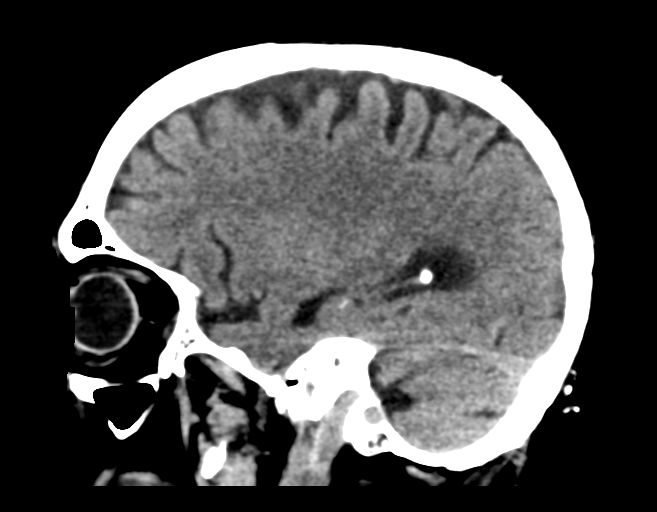
[im 29/57  brain]
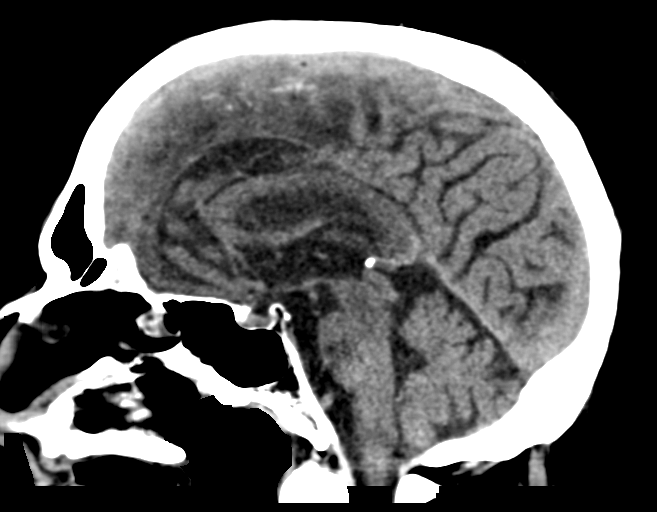
[im 38/57  brain]
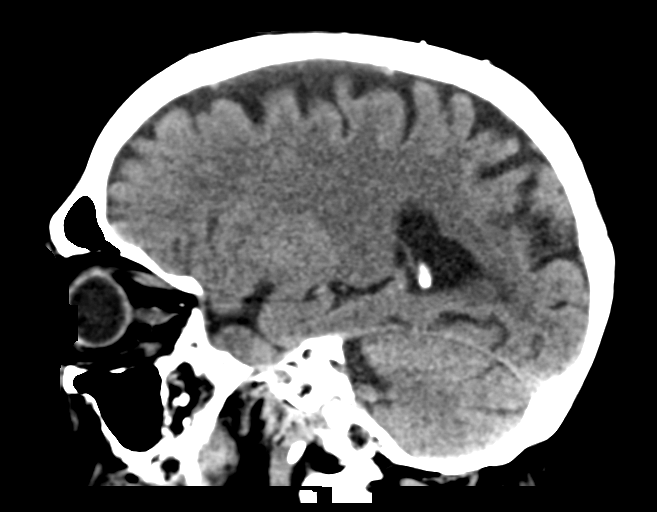

[15 of 47 positions shown; findings below may reference images not displayed]

FINDINGS: Brain: No evidence of acute large vascular territory infarction,
hemorrhage, hydrocephalus, extra-axial collection or mass
lesion/mass effect. Similar remote infarcts in the right caudate,
right cerebellum, left parieto-occipital cortex, and potentially the
high right frontal white matter. Additional patchy white matter
hypoattenuation, nonspecific but compatible with chronic
microvascular ischemic disease.

Vascular: No hyperdense vessel identified. Calcific intracranial
atherosclerosis.

Skull: No acute fracture.

Sinuses/Orbits: Clear sinuses.  Unremarkable orbits.

Other: No mastoid effusions.
IMPRESSION: 1. No evidence of acute intracranial abnormality.
2. Similar remote infarcts and chronic microvascular ischemic
disease.

## 2021-05-11 MED ORDER — LORAZEPAM 2 MG/ML IJ SOLN
1.0000 mg | Freq: Once | INTRAMUSCULAR | Status: AC | PRN
  Administered 2021-05-12: 1 mg via INTRAVENOUS
  Filled 2021-05-11: qty 1

## 2021-05-11 MED ORDER — CLOPIDOGREL BISULFATE 75 MG PO TABS: 75.0000 mg | ORAL_TABLET | Freq: Every morning | ORAL | Status: DC

## 2021-05-11 MED ORDER — SERTRALINE HCL 100 MG PO TABS: 100.0000 mg | ORAL_TABLET | Freq: Every morning | ORAL | Status: DC

## 2021-05-11 MED ORDER — HYDRALAZINE HCL 25 MG PO TABS: 25.0000 mg | ORAL_TABLET | Freq: Four times a day (QID) | ORAL | Status: DC | PRN

## 2021-05-11 MED ORDER — ASPIRIN EC 81 MG PO TBEC
81.0000 mg | DELAYED_RELEASE_TABLET | Freq: Every day | ORAL | Status: DC
  Filled 2021-05-11: qty 1

## 2021-05-11 MED ORDER — MELATONIN 5 MG PO TABS
10.0000 mg | ORAL_TABLET | Freq: Every day | ORAL | Status: DC
  Administered 2021-05-11: 10 mg via ORAL
  Filled 2021-05-11: qty 2

## 2021-05-11 MED ORDER — AMIODARONE HCL 200 MG PO TABS
200.0000 mg | ORAL_TABLET | Freq: Every day | ORAL | Status: DC
  Filled 2021-05-11: qty 1

## 2021-05-11 MED ORDER — HEPARIN SODIUM (PORCINE) 5000 UNIT/ML IJ SOLN
5000.0000 [IU] | Freq: Three times a day (TID) | INTRAMUSCULAR | Status: DC
  Administered 2021-05-11 – 2021-05-12 (×2): 5000 [IU] via SUBCUTANEOUS
  Filled 2021-05-11 (×2): qty 1

## 2021-05-11 MED ORDER — STROKE: EARLY STAGES OF RECOVERY BOOK
Freq: Once | Status: AC
  Filled 2021-05-11: qty 1

## 2021-05-11 MED ORDER — ACETAMINOPHEN 650 MG RE SUPP: 650.0000 mg | RECTAL | Status: DC | PRN

## 2021-05-11 MED ORDER — ATORVASTATIN CALCIUM 40 MG PO TABS: 40.0000 mg | ORAL_TABLET | Freq: Every day | ORAL | Status: DC

## 2021-05-11 MED ORDER — ALLOPURINOL 100 MG PO TABS
100.0000 mg | ORAL_TABLET | Freq: Every day | ORAL | Status: DC
  Filled 2021-05-11: qty 1

## 2021-05-11 MED ORDER — ACETAMINOPHEN 160 MG/5ML PO SOLN: 650.0000 mg | ORAL | Status: DC | PRN

## 2021-05-11 MED ORDER — SUCROFERRIC OXYHYDROXIDE 500 MG PO CHEW
500.0000 mg | CHEWABLE_TABLET | Freq: Two times a day (BID) | ORAL | Status: DC
  Filled 2021-05-11 (×2): qty 1

## 2021-05-11 MED ORDER — LEVOTHYROXINE SODIUM 75 MCG PO TABS: 75.0000 ug | ORAL_TABLET | Freq: Every day | ORAL | Status: DC

## 2021-05-11 MED ORDER — QUETIAPINE FUMARATE 25 MG PO TABS
25.0000 mg | ORAL_TABLET | Freq: Every day | ORAL | Status: DC
  Filled 2021-05-11 (×2): qty 1

## 2021-05-11 MED ORDER — SENNOSIDES-DOCUSATE SODIUM 8.6-50 MG PO TABS: 1.0000 | ORAL_TABLET | Freq: Every evening | ORAL | Status: DC | PRN

## 2021-05-11 MED ORDER — ACETAMINOPHEN 325 MG PO TABS: 650.0000 mg | ORAL_TABLET | ORAL | Status: DC | PRN

## 2021-05-11 MED ORDER — ISOSORBIDE MONONITRATE ER 30 MG PO TB24
30.0000 mg | ORAL_TABLET | Freq: Every day | ORAL | Status: DC
  Filled 2021-05-11: qty 1

## 2021-05-11 NOTE — ED Notes (Signed)
Pt moved to yellow at this time

## 2021-05-11 NOTE — ED Notes (Signed)
Received verbal report from Emily D RN at this time 
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
## 2021-05-11 NOTE — ED Notes (Signed)
The pt is refusing MRI, MD aware

## 2021-05-11 NOTE — ED Notes (Signed)
Patient transported to MRI 

## 2021-05-11 NOTE — ED Provider Notes (Signed)
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 MRA and labs. Patient understands and agrees with the plan.   The plan for this patient was discussed with my attending physician, who voiced agreement and who oversaw evaluation and treatment of this patient.     Note: Estate manager/land agent was used in the creation of this note.  Final Clinical Impression(s) / ED Diagnoses Final diagnoses:  Numbness    Rx / DC Orders ED Discharge Orders     None        Cherly Hensen, DO 05/12/21 0023    Varney Biles, MD 05/13/21 1322

## 2021-05-11 NOTE — ED Triage Notes (Addendum)
Patient with history of CVA and dialysis that experienced sudden right sided facial numbness and facial droop earlier today. Home health aid also reported dysarthria and she called 911. Symptoms resolved prior to EMS arrival to patient's home. Patient alert, oriented, and in no apparent distress at this time. 20g right forearm.  EMS vitals 150/100 HR 64 RR 16 SpO2 96% CBG 81

## 2021-05-11 NOTE — ED Notes (Signed)
Notified that

## 2021-05-11 NOTE — ED Notes (Signed)
Attempted to call report to yellow advised unable to take report at this time
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
## 2021-05-12 ENCOUNTER — Observation Stay (HOSPITAL_COMMUNITY): Payer: No Typology Code available for payment source

## 2021-05-12 ENCOUNTER — Inpatient Hospital Stay (HOSPITAL_COMMUNITY): Payer: Medicare Other

## 2021-05-12 DIAGNOSIS — I160 Hypertensive urgency: Secondary | ICD-10-CM

## 2021-05-12 DIAGNOSIS — E039 Hypothyroidism, unspecified: Secondary | ICD-10-CM | POA: Diagnosis present

## 2021-05-12 DIAGNOSIS — G9341 Metabolic encephalopathy: Secondary | ICD-10-CM | POA: Diagnosis present

## 2021-05-12 DIAGNOSIS — Z881 Allergy status to other antibiotic agents status: Secondary | ICD-10-CM | POA: Diagnosis not present

## 2021-05-12 DIAGNOSIS — N186 End stage renal disease: Secondary | ICD-10-CM

## 2021-05-12 DIAGNOSIS — D509 Iron deficiency anemia, unspecified: Secondary | ICD-10-CM | POA: Diagnosis present

## 2021-05-12 DIAGNOSIS — I6521 Occlusion and stenosis of right carotid artery: Secondary | ICD-10-CM | POA: Diagnosis not present

## 2021-05-12 DIAGNOSIS — R4182 Altered mental status, unspecified: Secondary | ICD-10-CM | POA: Diagnosis not present

## 2021-05-12 DIAGNOSIS — E875 Hyperkalemia: Secondary | ICD-10-CM | POA: Diagnosis present

## 2021-05-12 DIAGNOSIS — R451 Restlessness and agitation: Secondary | ICD-10-CM

## 2021-05-12 DIAGNOSIS — R34 Anuria and oliguria: Secondary | ICD-10-CM | POA: Diagnosis present

## 2021-05-12 DIAGNOSIS — I5042 Chronic combined systolic (congestive) and diastolic (congestive) heart failure: Secondary | ICD-10-CM | POA: Diagnosis present

## 2021-05-12 DIAGNOSIS — R4781 Slurred speech: Secondary | ICD-10-CM | POA: Diagnosis present

## 2021-05-12 DIAGNOSIS — Z880 Allergy status to penicillin: Secondary | ICD-10-CM | POA: Diagnosis not present

## 2021-05-12 DIAGNOSIS — F418 Other specified anxiety disorders: Secondary | ICD-10-CM | POA: Diagnosis not present

## 2021-05-12 DIAGNOSIS — Z66 Do not resuscitate: Secondary | ICD-10-CM | POA: Diagnosis present

## 2021-05-12 DIAGNOSIS — R296 Repeated falls: Secondary | ICD-10-CM | POA: Diagnosis present

## 2021-05-12 DIAGNOSIS — R29818 Other symptoms and signs involving the nervous system: Secondary | ICD-10-CM

## 2021-05-12 DIAGNOSIS — I480 Paroxysmal atrial fibrillation: Secondary | ICD-10-CM | POA: Diagnosis present

## 2021-05-12 DIAGNOSIS — Z515 Encounter for palliative care: Secondary | ICD-10-CM | POA: Diagnosis not present

## 2021-05-12 DIAGNOSIS — F0393 Unspecified dementia, unspecified severity, with mood disturbance: Secondary | ICD-10-CM | POA: Diagnosis present

## 2021-05-12 DIAGNOSIS — I132 Hypertensive heart and chronic kidney disease with heart failure and with stage 5 chronic kidney disease, or end stage renal disease: Secondary | ICD-10-CM | POA: Diagnosis present

## 2021-05-12 DIAGNOSIS — Z992 Dependence on renal dialysis: Secondary | ICD-10-CM | POA: Diagnosis not present

## 2021-05-12 DIAGNOSIS — Z7189 Other specified counseling: Secondary | ICD-10-CM

## 2021-05-12 DIAGNOSIS — Z20822 Contact with and (suspected) exposure to covid-19: Secondary | ICD-10-CM | POA: Diagnosis present

## 2021-05-12 DIAGNOSIS — R410 Disorientation, unspecified: Secondary | ICD-10-CM | POA: Diagnosis not present

## 2021-05-12 DIAGNOSIS — Z789 Other specified health status: Secondary | ICD-10-CM

## 2021-05-12 DIAGNOSIS — I2510 Atherosclerotic heart disease of native coronary artery without angina pectoris: Secondary | ICD-10-CM | POA: Diagnosis present

## 2021-05-12 DIAGNOSIS — R200 Anesthesia of skin: Secondary | ICD-10-CM | POA: Diagnosis present

## 2021-05-12 DIAGNOSIS — Z887 Allergy status to serum and vaccine status: Secondary | ICD-10-CM | POA: Diagnosis not present

## 2021-05-12 DIAGNOSIS — I255 Ischemic cardiomyopathy: Secondary | ICD-10-CM | POA: Diagnosis present

## 2021-05-12 DIAGNOSIS — Z8673 Personal history of transient ischemic attack (TIA), and cerebral infarction without residual deficits: Secondary | ICD-10-CM | POA: Diagnosis not present

## 2021-05-12 DIAGNOSIS — I739 Peripheral vascular disease, unspecified: Secondary | ICD-10-CM | POA: Diagnosis not present

## 2021-05-12 LAB — CBC
HCT: 36.6 % — ABNORMAL LOW (ref 39.0–52.0)
Hemoglobin: 11.9 g/dL — ABNORMAL LOW (ref 13.0–17.0)
MCH: 34.5 pg — ABNORMAL HIGH (ref 26.0–34.0)
MCHC: 32.5 g/dL (ref 30.0–36.0)
MCV: 106.1 fL — ABNORMAL HIGH (ref 80.0–100.0)
Platelets: 240 10*3/uL (ref 150–400)
RBC: 3.45 MIL/uL — ABNORMAL LOW (ref 4.22–5.81)
RDW: 13.5 % (ref 11.5–15.5)
WBC: 11.6 10*3/uL — ABNORMAL HIGH (ref 4.0–10.5)
nRBC: 0 % (ref 0.0–0.2)

## 2021-05-12 LAB — T4, FREE: Free T4: 1.05 ng/dL (ref 0.61–1.12)

## 2021-05-12 LAB — LIPID PANEL
Cholesterol: 178 mg/dL (ref 0–200)
HDL: 31 mg/dL — ABNORMAL LOW (ref >40)
LDL Cholesterol: 100 mg/dL — ABNORMAL HIGH (ref 0–99)
Total CHOL/HDL Ratio: 5.7 RATIO
Triglycerides: 234 mg/dL — ABNORMAL HIGH (ref <150)
VLDL: 47 mg/dL — ABNORMAL HIGH (ref 0–40)

## 2021-05-12 LAB — RESP PANEL BY RT-PCR (FLU A&B, COVID) ARPGX2
Influenza A by PCR: NEGATIVE
Influenza B by PCR: NEGATIVE
SARS Coronavirus 2 by RT PCR: NEGATIVE

## 2021-05-12 LAB — AMMONIA: Ammonia: 30 umol/L (ref 9–35)

## 2021-05-12 IMAGING — MR MR HEAD W/O CM
5 series · 48 of 48 positions shown · non-contrast
Comparison: None.

CLINICAL DATA: Transient ischemic attack

EXAM:
MRI HEAD WITHOUT CONTRAST
TECHNIQUE: Multiplanar, multiecho pulse sequences of the brain and surrounding
structures were obtained without intravenous contrast.

[Series 5: DWI · axial · 3.0mm · 0.88mm/px · z∈[-63,+78]mm · 18 of 104 slices shown (1 of 4)]
[im 1/104]
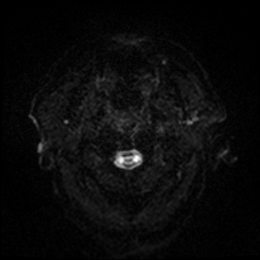
[im 7/104]
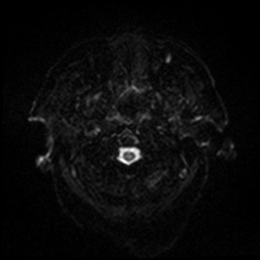
[im 13/104]
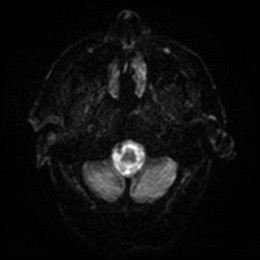
[im 19/104]
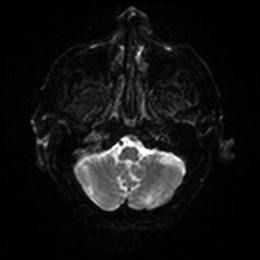
[im 25/104]
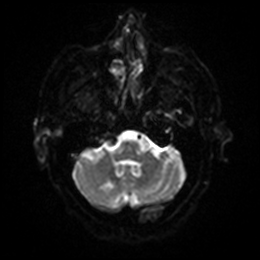
[im 31/104]
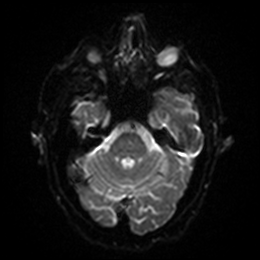
[im 37/104]
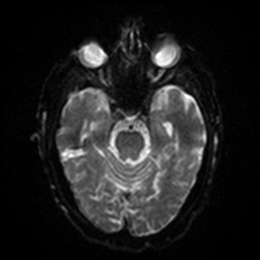
[im 43/104]
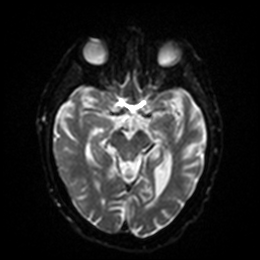
[im 49/104]
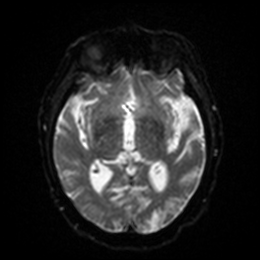
[im 55/104]
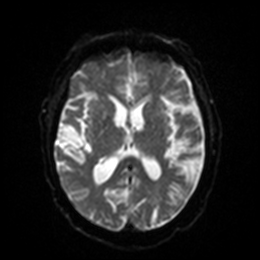
[im 61/104]
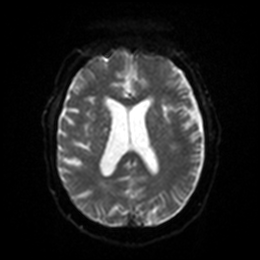
[im 67/104]
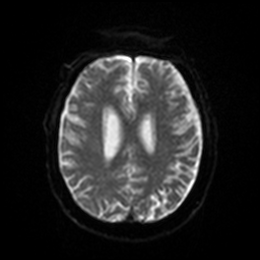
[im 73/104]
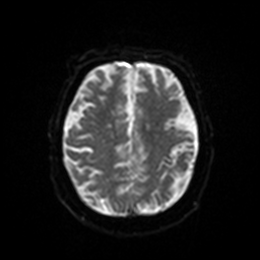
[im 79/104]
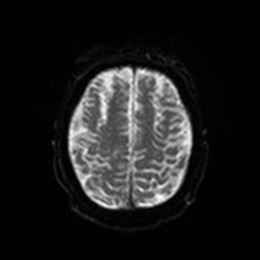
[im 85/104]
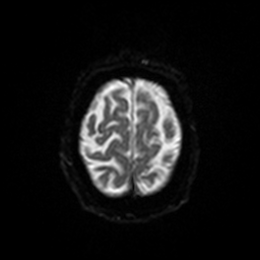
[im 91/104]
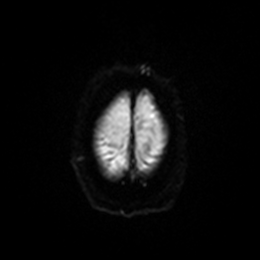
[im 97/104]
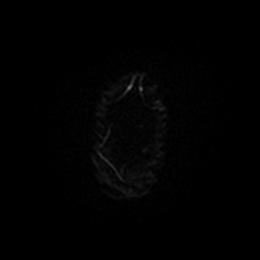
[im 104/104]
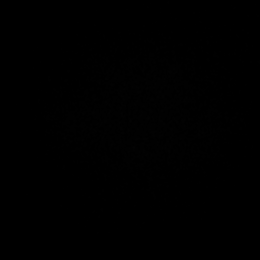

[Series 6: DWI · axial · 3.0mm · 0.88mm/px · z∈[-63,+72]mm · 8 of 50 slices shown (2 of 4)]
[im 1/50]
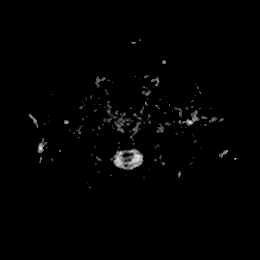
[im 8/50]
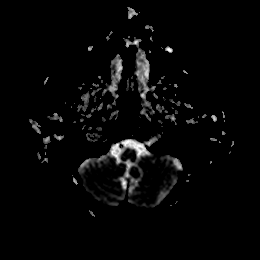
[im 15/50]
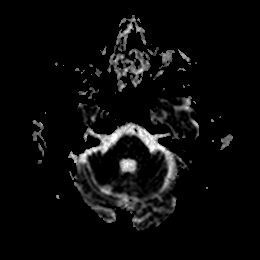
[im 22/50]
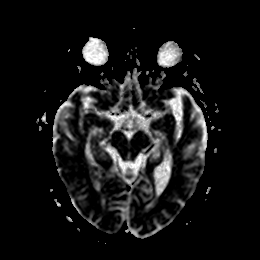
[im 29/50]
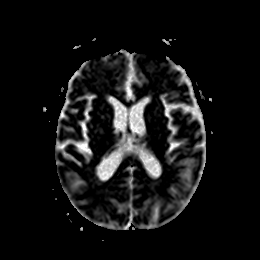
[im 36/50]
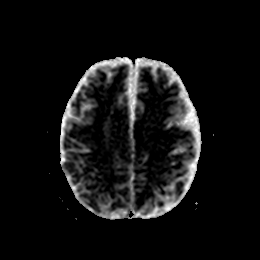
[im 43/50]
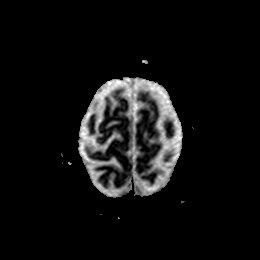
[im 50/50]
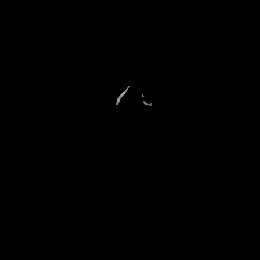

[Series 7: DWI · coronal · 4.0mm · 0.88mm/px · 13 of 76 slices shown (3 of 4)]
[im 1/76]
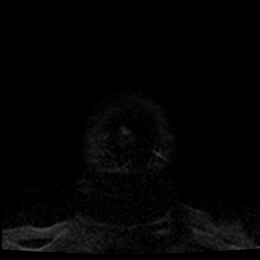
[im 7/76]
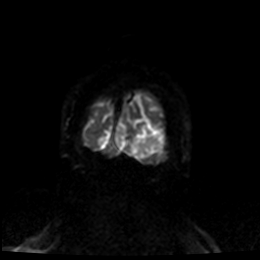
[im 13/76]
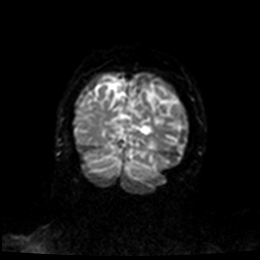
[im 19/76]
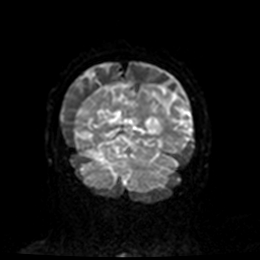
[im 26/76]
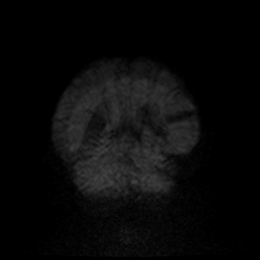
[im 32/76]
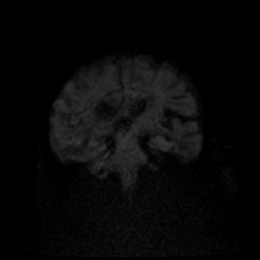
[im 38/76]
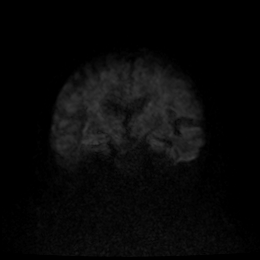
[im 44/76]
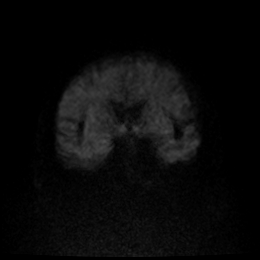
[im 51/76]
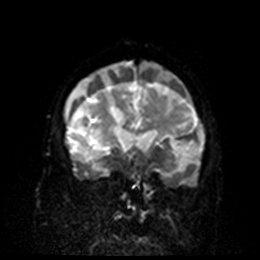
[im 57/76]
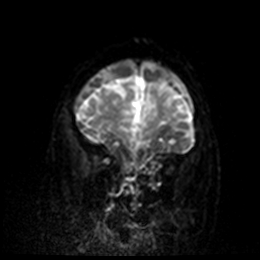
[im 63/76]
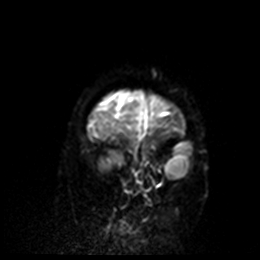
[im 69/76]
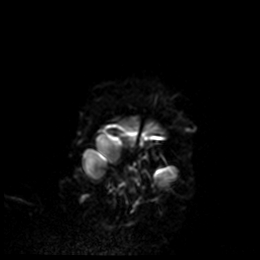
[im 76/76]
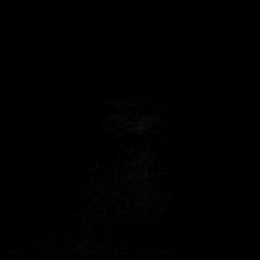

[Series 8: DWI · coronal · 4.0mm · 0.88mm/px · 6 of 38 slices shown (4 of 4)]
[im 1/38]
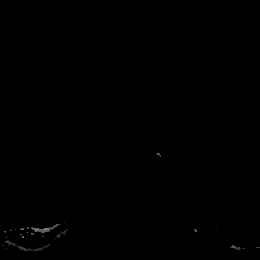
[im 8/38]
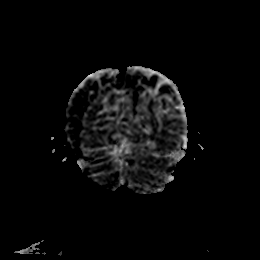
[im 15/38]
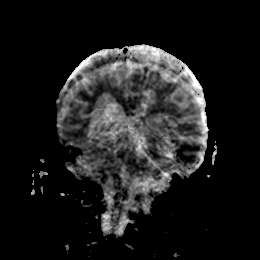
[im 23/38]
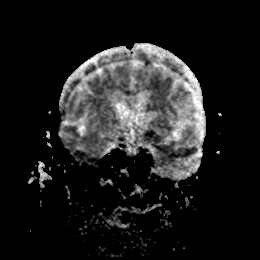
[im 30/38]
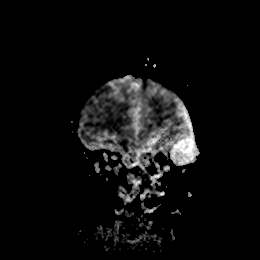
[im 38/38]
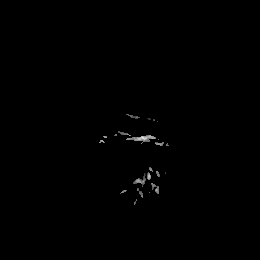

[Series 9: FLAIR · axial · 5.0mm · 0.45mm/px · z∈[-77,+78]mm · 3 of 15 slices shown]
[im 1/15]
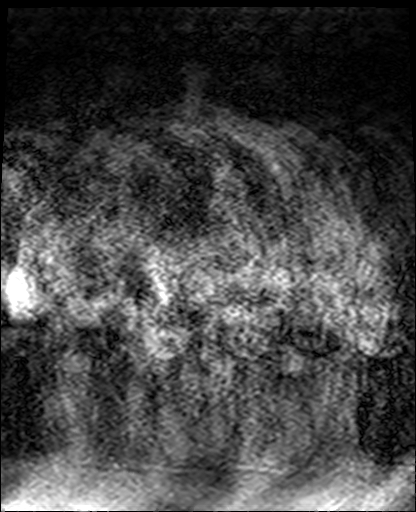
[im 8/15]
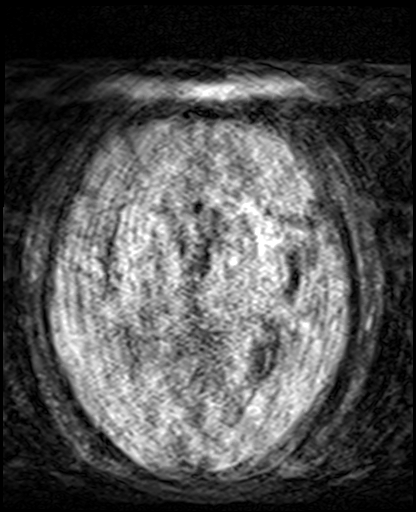
[im 15/15]
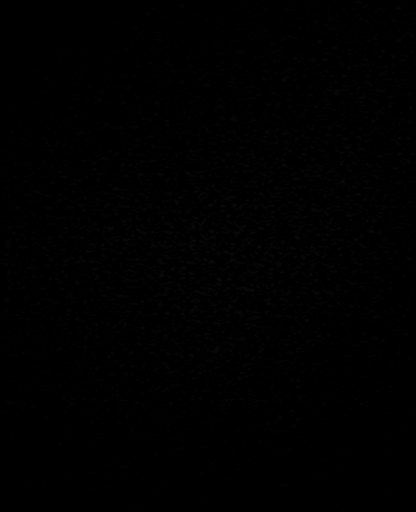

[48 of 48 positions shown; findings below may reference images not displayed]

FINDINGS: Examination degraded by motion. The examination was not run to
completion due to patient altered mental status and inability to
cooperate with the technologist.

Diffusion-weighted imaging shows no acute infarct. No midline shift
or other mass effect.
IMPRESSION: Truncated and motion degraded examination. No acute infarct.

## 2021-05-12 IMAGING — CT CT HEAD W/O CM
4 series · 17 of 47 positions shown, 19 images · non-contrast
Comparison: [DATE]

CLINICAL DATA: Delirium

EXAM:
CT HEAD WITHOUT CONTRAST
TECHNIQUE: Contiguous axial images were obtained from the base of the skull
through the vertex without intravenous contrast.

[Series 2: head wo · axial · 0.41mm/px · z∈[-220,-90]mm · 7 of 36 slices shown, 9 images]
[im 5/36  brain]
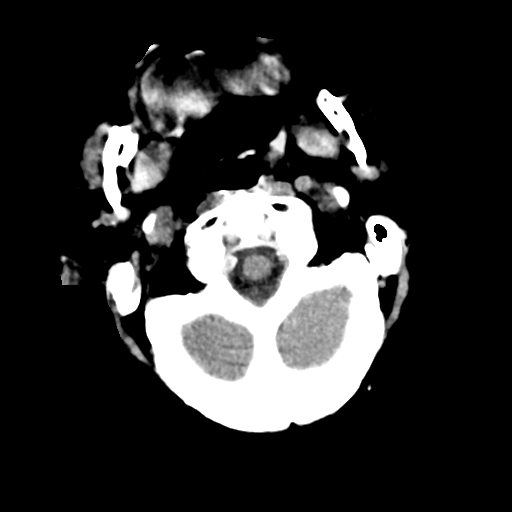
[im 5/36  bone]
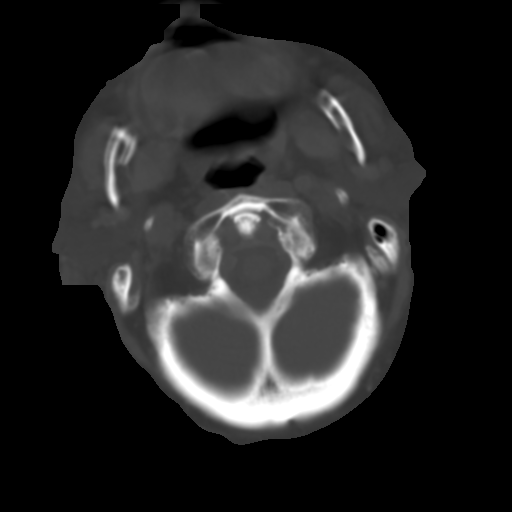
[im 9/36  brain]
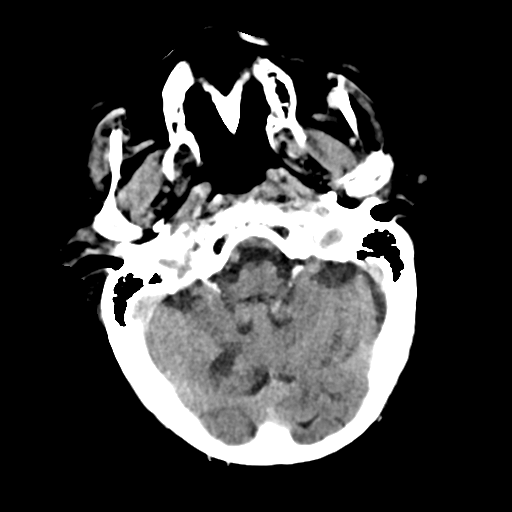
[im 14/36  brain]
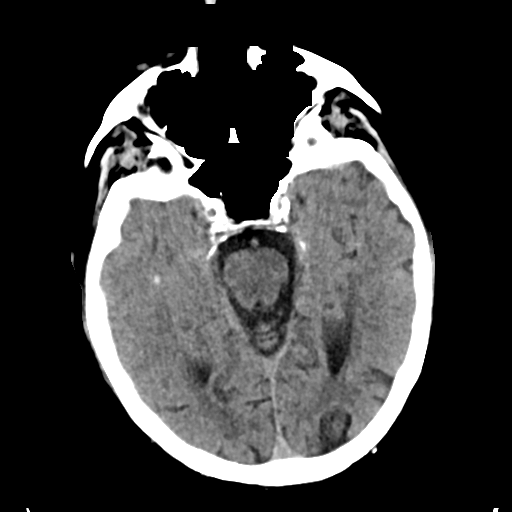
[im 18/36  brain]
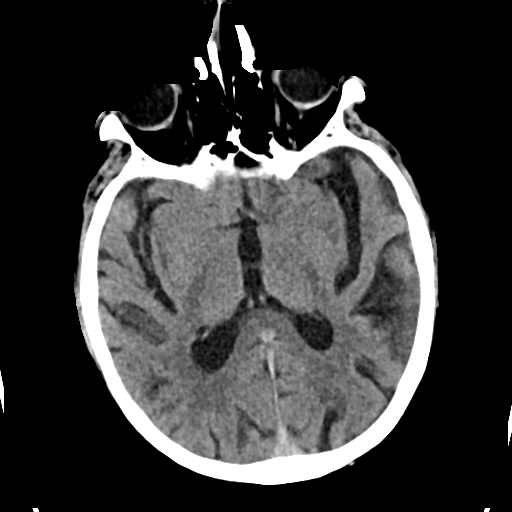
[im 22/36  brain]
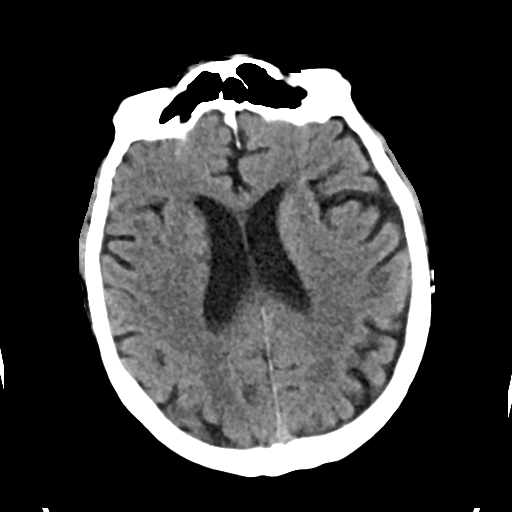
[im 22/36  bone]
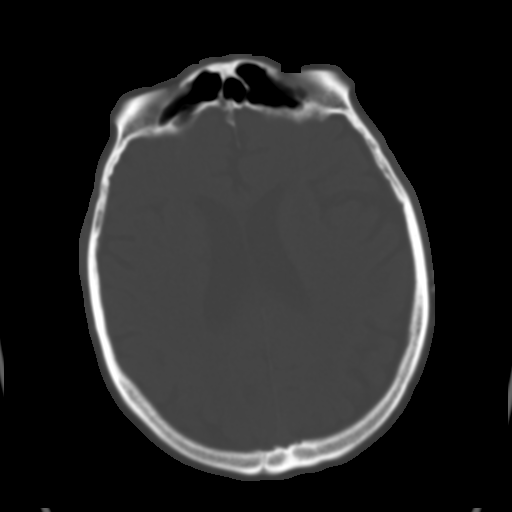
[im 27/36  brain]
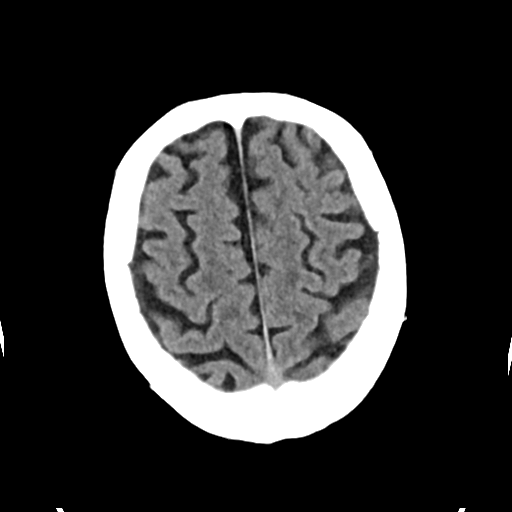
[im 31/36  brain]
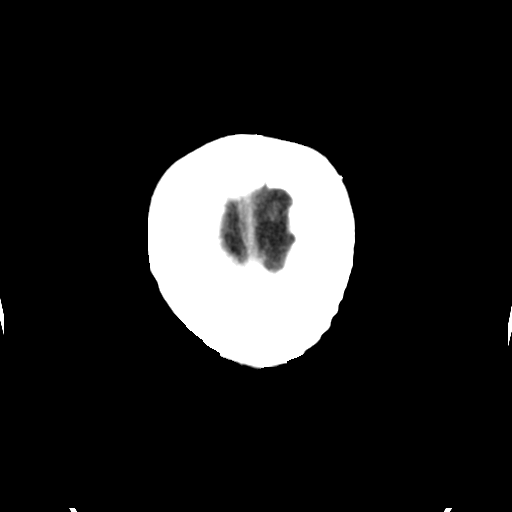

[Series 3: head bone · axial · 0.41mm/px · z∈[-224,-160]mm · 4 of 90 slices shown]
[im 9/90  bone]
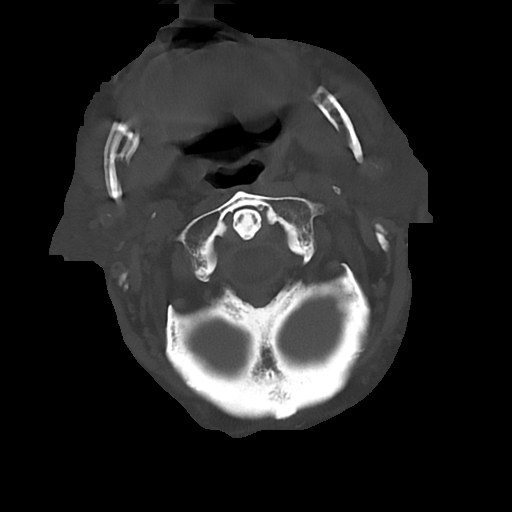
[im 18/90  bone]
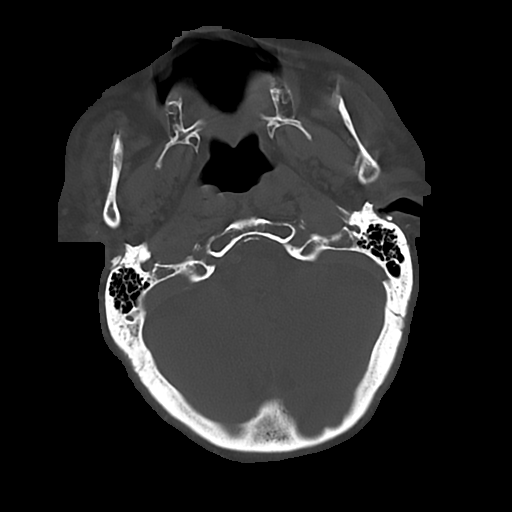
[im 27/90  bone]
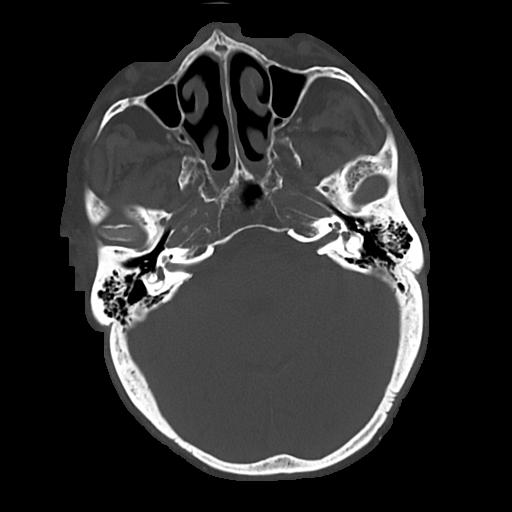
[im 41/90  bone]
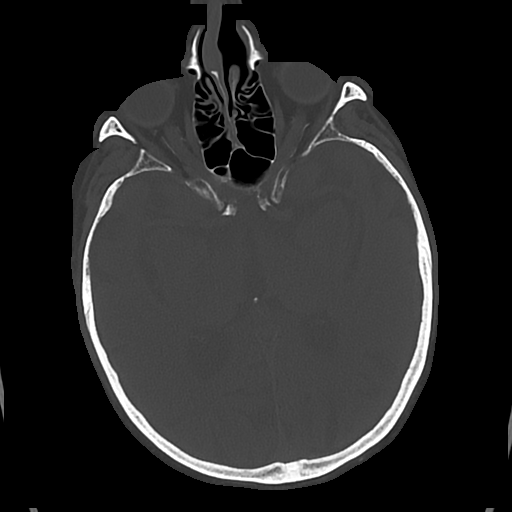

[Series 4: cor soft · coronal · 0.37mm/px · 3 of 70 slices shown]
[im 26/70  brain]
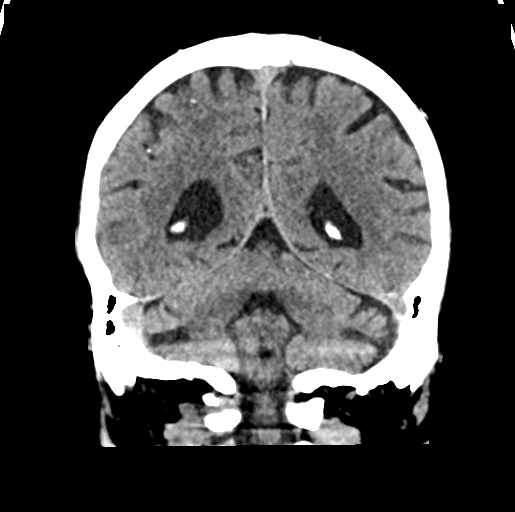
[im 33/70  brain]
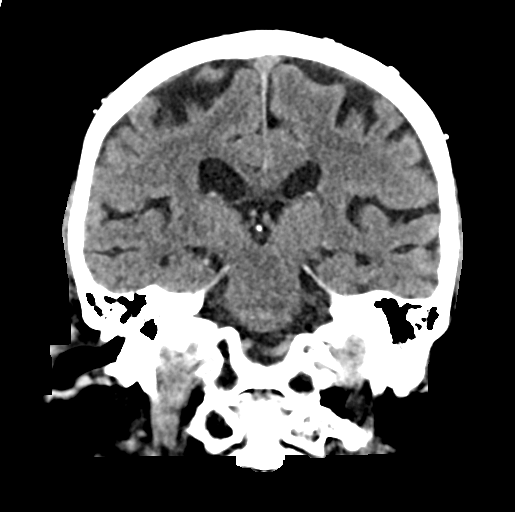
[im 39/70  brain]
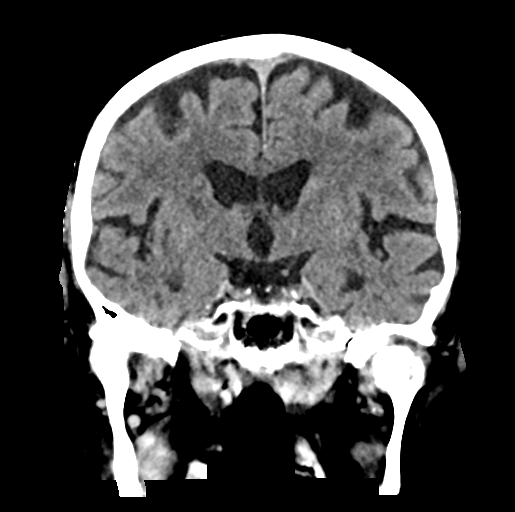

[Series 5: sag soft · sagittal · 0.36mm/px · 3 of 60 slices shown]
[im 20/60  brain]
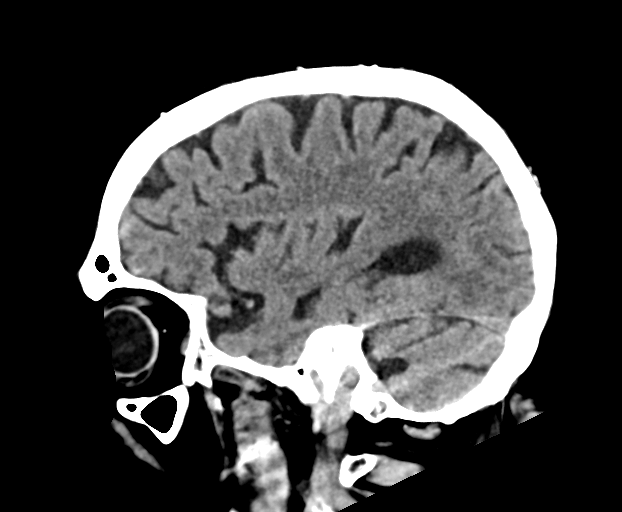
[im 30/60  brain]
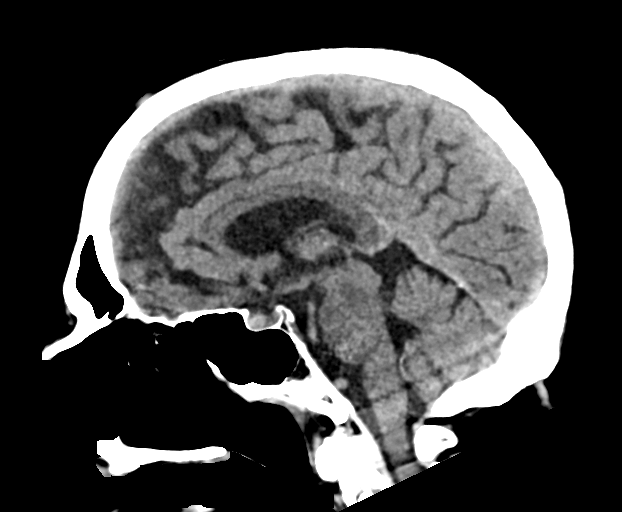
[im 40/60  brain]
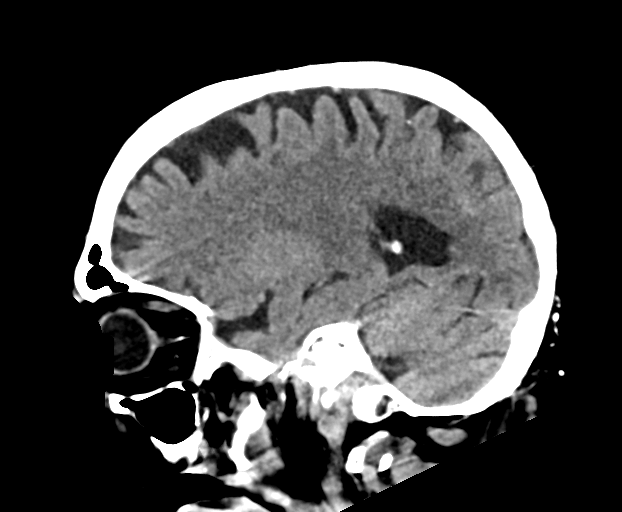

[17 of 47 positions shown; findings below may reference images not displayed]

FINDINGS: Brain: There is no mass, hemorrhage or extra-axial collection. The
size and configuration of the ventricles and extra-axial CSF spaces
are normal. Old small vessel infarcts of the right basal ganglia.
Mild findings of chronic small vessel disease.

Vascular: No abnormal hyperdensity of the major intracranial
arteries or dural venous sinuses. No intracranial atherosclerosis.

Skull: The visualized skull base, calvarium and extracranial soft
tissues are normal.

Sinuses/Orbits: No fluid levels or advanced mucosal thickening of
the visualized paranasal sinuses. No mastoid or middle ear effusion.
The orbits are normal.
IMPRESSION: 1. No acute intracranial abnormality.
2. Old small vessel infarcts of the right basal ganglia and mild
chronic small vessel disease.

## 2021-05-12 MED ORDER — HALOPERIDOL 0.5 MG PO TABS: 0.5000 mg | ORAL_TABLET | ORAL | Status: DC | PRN

## 2021-05-12 MED ORDER — LORAZEPAM 2 MG/ML IJ SOLN
1.0000 mg | INTRAMUSCULAR | Status: DC
Start: 2021-05-12 — End: 2021-05-13
  Administered 2021-05-12 – 2021-05-13 (×5): 1 mg via INTRAVENOUS
  Filled 2021-05-12 (×6): qty 1

## 2021-05-12 MED ORDER — ONDANSETRON HCL 4 MG/2ML IJ SOLN: 4.0000 mg | Freq: Four times a day (QID) | INTRAMUSCULAR | Status: DC | PRN

## 2021-05-12 MED ORDER — HEPARIN SODIUM (PORCINE) 1000 UNIT/ML DIALYSIS: 2000.0000 [IU] | Freq: Once | INTRAMUSCULAR | Status: DC

## 2021-05-12 MED ORDER — HYDRALAZINE HCL 20 MG/ML IJ SOLN: 10.0000 mg | Freq: Four times a day (QID) | INTRAMUSCULAR | Status: DC | PRN

## 2021-05-12 MED ORDER — GLYCOPYRROLATE 1 MG PO TABS
1.0000 mg | ORAL_TABLET | ORAL | Status: DC | PRN
  Filled 2021-05-12: qty 1

## 2021-05-12 MED ORDER — HYDROMORPHONE HCL 1 MG/ML IJ SOLN
1.0000 mg | INTRAMUSCULAR | Status: DC | PRN
  Administered 2021-05-13 (×2): 1 mg via INTRAVENOUS
  Filled 2021-05-12 (×2): qty 1

## 2021-05-12 MED ORDER — GLYCOPYRROLATE 0.2 MG/ML IJ SOLN: 0.2000 mg | INTRAMUSCULAR | Status: DC | PRN

## 2021-05-12 MED ORDER — LORAZEPAM 2 MG/ML IJ SOLN: 1.0000 mg | INTRAMUSCULAR | Status: DC | PRN

## 2021-05-12 MED ORDER — POLYVINYL ALCOHOL 1.4 % OP SOLN: 1.0000 [drp] | Freq: Four times a day (QID) | OPHTHALMIC | Status: DC | PRN

## 2021-05-12 MED ORDER — THIAMINE HCL 100 MG/ML IJ SOLN
500.0000 mg | Freq: Three times a day (TID) | INTRAVENOUS | Status: DC
  Administered 2021-05-12: 500 mg via INTRAVENOUS
  Filled 2021-05-12 (×3): qty 5

## 2021-05-12 MED ORDER — SODIUM CHLORIDE 0.9 % IV SOLN: 250.0000 mL | INTRAVENOUS | Status: DC | PRN

## 2021-05-12 MED ORDER — MORPHINE SULFATE (PF) 4 MG/ML IV SOLN
3.0000 mg | INTRAVENOUS | Status: DC | PRN
  Administered 2021-05-12: 3 mg via INTRAVENOUS
  Filled 2021-05-12: qty 1

## 2021-05-12 MED ORDER — HALOPERIDOL LACTATE 5 MG/ML IJ SOLN
2.0000 mg | Freq: Four times a day (QID) | INTRAMUSCULAR | Status: DC | PRN
  Administered 2021-05-12: 2 mg via INTRAVENOUS
  Filled 2021-05-12: qty 1

## 2021-05-12 MED ORDER — DIPHENHYDRAMINE HCL 50 MG/ML IJ SOLN: 12.5000 mg | INTRAMUSCULAR | Status: DC | PRN

## 2021-05-12 MED ORDER — LORAZEPAM 2 MG/ML PO CONC: 1.0000 mg | ORAL | Status: DC | PRN

## 2021-05-12 MED ORDER — QUETIAPINE FUMARATE 50 MG PO TABS
25.0000 mg | ORAL_TABLET | Freq: Two times a day (BID) | ORAL | Status: DC
  Filled 2021-05-12 (×2): qty 1

## 2021-05-12 MED ORDER — HALOPERIDOL LACTATE 2 MG/ML PO CONC: 0.5000 mg | ORAL | Status: DC | PRN

## 2021-05-12 MED ORDER — MORPHINE SULFATE (PF) 2 MG/ML IV SOLN
1.0000 mg | INTRAVENOUS | Status: DC | PRN
  Administered 2021-05-12: 2 mg via INTRAVENOUS
  Filled 2021-05-12: qty 1

## 2021-05-12 MED ORDER — SODIUM CHLORIDE 0.9% FLUSH
3.0000 mL | Freq: Two times a day (BID) | INTRAVENOUS | Status: DC
  Administered 2021-05-12: 3 mL via INTRAVENOUS

## 2021-05-12 MED ORDER — ONDANSETRON 4 MG PO TBDP: 4.0000 mg | ORAL_TABLET | Freq: Four times a day (QID) | ORAL | Status: DC | PRN

## 2021-05-12 MED ORDER — HALOPERIDOL 1 MG PO TABS
2.0000 mg | ORAL_TABLET | ORAL | Status: DC | PRN
  Filled 2021-05-12: qty 2

## 2021-05-12 MED ORDER — SODIUM CHLORIDE 0.9% FLUSH: 3.0000 mL | INTRAVENOUS | Status: DC | PRN

## 2021-05-12 MED ORDER — LABETALOL HCL 5 MG/ML IV SOLN
10.0000 mg | INTRAVENOUS | Status: DC | PRN
  Administered 2021-05-12: 10 mg via INTRAVENOUS
  Filled 2021-05-12 (×2): qty 4

## 2021-05-12 MED ORDER — LORAZEPAM 2 MG/ML IJ SOLN
1.0000 mg | INTRAMUSCULAR | Status: DC | PRN
  Administered 2021-05-12: 1 mg via INTRAVENOUS
  Filled 2021-05-12: qty 1

## 2021-05-12 MED ORDER — HYDROMORPHONE HCL 1 MG/ML IJ SOLN
1.0000 mg | INTRAMUSCULAR | Status: DC | PRN
  Administered 2021-05-12: 1 mg via INTRAVENOUS
  Filled 2021-05-12: qty 1

## 2021-05-12 MED ORDER — BIOTENE DRY MOUTH MT LIQD
15.0000 mL | Freq: Two times a day (BID) | OROMUCOSAL | Status: DC
  Administered 2021-05-12 – 2021-05-13 (×2): 15 mL via TOPICAL

## 2021-05-12 MED ORDER — CHLORHEXIDINE GLUCONATE CLOTH 2 % EX PADS: 6.0000 | MEDICATED_PAD | Freq: Every day | CUTANEOUS | Status: DC

## 2021-05-12 MED ORDER — ACETAMINOPHEN 325 MG PO TABS: 650.0000 mg | ORAL_TABLET | Freq: Four times a day (QID) | ORAL | Status: DC | PRN

## 2021-05-12 MED ORDER — HALOPERIDOL LACTATE 2 MG/ML PO CONC
2.0000 mg | ORAL | Status: DC | PRN
  Filled 2021-05-12: qty 1

## 2021-05-12 MED ORDER — LORAZEPAM 1 MG PO TABS: 1.0000 mg | ORAL_TABLET | ORAL | Status: DC | PRN

## 2021-05-12 MED ORDER — HALOPERIDOL LACTATE 5 MG/ML IJ SOLN: 2.0000 mg | INTRAMUSCULAR | Status: DC | PRN

## 2021-05-12 MED ORDER — HALOPERIDOL LACTATE 5 MG/ML IJ SOLN: 0.5000 mg | INTRAMUSCULAR | Status: DC | PRN

## 2021-05-12 MED ORDER — ACETAMINOPHEN 650 MG RE SUPP: 650.0000 mg | Freq: Four times a day (QID) | RECTAL | Status: DC | PRN
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
## 2021-05-12 NOTE — ED Notes (Signed)
Patient not following commands. This RN attempted to provide PO fluids and patient was unable to consume; unable to take anything PO at this time. Holding PO meds at this time.

## 2021-05-12 NOTE — Progress Notes (Signed)
PT Cancellation Note  Patient Details Name: Troy [name] MRN: [id number] DOB: Oct 27, 1940   Cancelled Treatment:    Reason Eval/Treat Not Completed: Other (comment).  Pt is not able to follow instructions and is a bit agitated, will retry later as time and pt allow.   Ramond Dial 05/12/2021, 10:39 AM  Mee Hives, PT PhD Acute Rehab Dept. Number: [place] and [place]

## 2021-05-12 NOTE — Progress Notes (Signed)
CSW received a call from the hospitalist who stated patient will need residential hospice. CSW went down to speak to the daughter. The daughter stated she would like to go with [facility] because that's who she had with her mother. CSW explained that an evaluation will take place to determine if he is appropriate for residential hospice. CSW also let the daughter know that someone from [place] will reach out to her and go over the expectations. Patients daughter also asked if the chaplain can come pray over her father.    CSW sent a page and left a message with the chaplain.

## 2021-05-12 NOTE — Progress Notes (Signed)
PT Cancellation Note  Patient Details Name: Troy [name] MRN: [id number] DOB: 07/16/1940   Cancelled Treatment:    Reason Eval/Treat Not Completed: Patient's level of consciousness.  Lethargic after meds given.  Order now discharged for PT and will await another order to see pt.   Ramond Dial 05/12/2021, 5:09 PM  Mee Hives, PT PhD Acute Rehab Dept. Number: [place] and [place]

## 2021-05-12 NOTE — Progress Notes (Addendum)
PROGRESS NOTE        PATIENT DETAILS Name: Troy [name] Age: 80 y.o. Sex: male Date of Birth: May 26, 1941 Admit Date: 05/11/2021 Admitting Physician Vianne Bulls, MD PCP:Pcp, No  Brief Narrative: Patient is a 80 y.o. male with history of ESRD on HD, HTN, HFrEF, dementia, hypothyroidism, PAF no longer on anticoagulation due to recurrent falls-presenting with altered mental status dysarthria.  See below for further details.    Significant events: 11/3>> admit for confusion-uncontrolled hypertension. 11/4>> nephrology discussed with family-given significant decline in quality of life/function-transitioned to comfort measures. No further Hemodialysis planned.  Subjective: Restless-agitated-confused-daughter at bedside.  Objective: Vitals: Blood pressure (!) 198/167, pulse (!) 52, temperature 97.9 F (36.6 C), temperature source Oral, resp. rate 19, SpO2 99 %.   Exam: Gen Exam: Restless/agitated/confused-not following commands. HEENT:atraumatic, normocephalic Chest: B/L clear to auscultation anteriorly CVS:S1S2 regular Abdomen:soft non tender, non distended Extremities:no edema Neurology: Difficult exam-moving all 4 extremities.   Skin: no rash  Pertinent Labs/Radiology: Recent Labs  Lab 05/11/21 1750 05/12/21 0340  WBC 8.6 11.6*  HGB 11.8* 11.9*  PLT 188 240  NA 138  --   K 5.4*  --   CREATININE 6.34*  --   AST 25  --   ALT 18  --   ALKPHOS 96  --   BILITOT 0.7  --      11/3>> MRA brain/neck: No LVO.  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 11/4>> MRI brain: Motion degraded exam-diffusion-weighted images without CVA.   Assessment/Plan: Acute metabolic encephalopathy superimposed on dementia: Remains very confused/agitated-MRI brain negative for CVA-did not show any changes consistent with PR ES (however motion degraded study).  Etiology unclear-doubt benefit from further work-up given that he has now been transitioned to comfort  measures-see below.  Hypertensive urgency: All antihypertensives discontinued now that patient has been transitioned to comfort measures.  PAF: Not anticoagulate due to history of recurrent falls-all rate control medications discontinued as patient has been transitioned to full comfort measures.  Hyperkalemia: No further dialysis planned.  Comfort measures in effect.  ESRD: Nephrology following-no further plans for HD-has been transitioned to comfort measures.  HFrEF: Volume status stable.  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  Per renal documentation-significant decline in memory and function-now using motorized wheelchair.  Quality of life felt to be very poor-nephrology discussed options with daughter-and subsequently has been transitioned to full comfort measures.  Have consulted social work for residential hospice placement.  Have started comfort care order sets and comfort medications.   Procedures: None Consults: Renal, palliative care DVT Prophylaxis: Not needed. Code Status:DNR Family Communication: Daughter at bedside.  Time spent: 25 minutes-Greater than 50% of this time was spent in counseling, explanation of diagnosis, planning of further management, and coordination of care.   Disposition Plan: Status is: Observation  The patient will require care spanning > 2 midnights and should be moved to inpatient because: Transition to comfort measures.    Diet: Diet Order             Diet regular Room service appropriate? Yes; Fluid consistency: Thin  Diet effective now                     Antimicrobial agents: Anti-infectives (From admission, onward)  None        MEDICATIONS: Scheduled Meds:  antiseptic oral rinse  15 mL Topical BID   LORazepam  1 mg Intravenous Q4H   QUEtiapine  25 mg Oral BID   Continuous Infusions: PRN  Meds:.acetaminophen **OR** acetaminophen, diphenhydrAMINE, glycopyrrolate **OR** glycopyrrolate **OR** glycopyrrolate, haloperidol **OR** haloperidol **OR** haloperidol lactate, HYDROmorphone (DILAUDID) injection, LORazepam **OR** LORazepam **OR** LORazepam, ondansetron **OR** ondansetron (ZOFRAN) IV, polyvinyl alcohol   I have personally reviewed following labs and imaging studies  LABORATORY DATA: CBC: Recent Labs  Lab 05/11/21 1750 05/12/21 0340  WBC 8.6 11.6*  HGB 11.8* 11.9*  HCT 35.8* 36.6*  MCV 105.9* 106.1*  PLT 188 035    Basic Metabolic Panel: Recent Labs  Lab 05/11/21 1750  NA 138  K 5.4*  CL 96*  CO2 29  GLUCOSE 91  BUN 28*  CREATININE 6.34*  CALCIUM 9.2    GFR: CrCl cannot be calculated (Unknown ideal weight.).  Liver Function Tests: Recent Labs  Lab 05/11/21 1750  AST 25  ALT 18  ALKPHOS 96  BILITOT 0.7  PROT 7.1  ALBUMIN 3.3*   No results for input(s): LIPASE, AMYLASE in the last 168 hours. Recent Labs  Lab 05/12/21 0340  AMMONIA 30    Coagulation Profile: Recent Labs  Lab 05/11/21 1750  INR 1.1    Cardiac Enzymes: No results for input(s): CKTOTAL, CKMB, CKMBINDEX, TROPONINI in the last 168 hours.  BNP (last 3 results) No results for input(s): PROBNP in the last 8760 hours.  Lipid Profile: Recent Labs    05/12/21 0340  CHOL 178  HDL 31*  LDLCALC 100*  TRIG 234*  CHOLHDL 5.7    Thyroid Function Tests: Recent Labs    05/11/21 1750 05/12/21 0340  TSH 6.062*  --   FREET4  --  1.05    Anemia Panel: No results for input(s): VITAMINB12, FOLATE, FERRITIN, TIBC, IRON, RETICCTPCT in the last 72 hours.  Urine analysis:    Component Value Date/Time   COLORURINE YELLOW 06/05/2016 0328   APPEARANCEUR CLEAR 06/05/2016 0328   LABSPEC 1.010 06/05/2016 0328   PHURINE 5.0 06/05/2016 0328   GLUCOSEU NEGATIVE 06/05/2016 0328   HGBUR TRACE (A) 06/05/2016 0328   BILIRUBINUR NEGATIVE 06/05/2016 0328   KETONESUR NEGATIVE  06/05/2016 0328   PROTEINUR NEGATIVE 06/05/2016 0328   UROBILINOGEN 0.2 02/15/2010 0845   NITRITE NEGATIVE 06/05/2016 0328   LEUKOCYTESUR NEGATIVE 06/05/2016 0328    Sepsis Labs: Lactic Acid, Venous    Component Value Date/Time   LATICACIDVEN 0.6 06/05/2016 4656    MICROBIOLOGY: Recent Results (from the past 240 hour(s))  Resp Panel by RT-PCR (Flu A&B, Covid) Nasopharyngeal Swab     Status: None   Collection Time: 05/12/21 10:34 AM   Specimen: Nasopharyngeal Swab; Nasopharyngeal(NP) swabs in vial transport medium  Result Value Ref Range Status   SARS Coronavirus 2 by RT PCR NEGATIVE NEGATIVE Final    Comment: (NOTE) SARS-CoV-2 target nucleic acids are NOT DETECTED.  The SARS-CoV-2 RNA is generally detectable in upper respiratory specimens during the acute phase of infection. The lowest concentration of SARS-CoV-2 viral copies this assay can detect is 138 copies/mL. A negative result does not preclude SARS-Cov-2 infection and should not be used as the sole basis for treatment or other patient management decisions. A negative result may occur with  improper specimen collection/handling, submission of specimen other than nasopharyngeal swab, presence of viral mutation(s) within the areas targeted by this assay, and inadequate number of viral copies(<138 copies/mL). A negative  result must be combined with clinical observations, patient history, and epidemiological information. The expected result is Negative.  Fact Sheet for Patients:  EntrepreneurPulse.com.au  Fact Sheet for Healthcare Providers:  IncredibleEmployment.be  This test is no t yet approved or cleared by the [place] FDA and  has been authorized for detection and/or diagnosis of SARS-CoV-2 by FDA under an Emergency Use Authorization (EUA). This EUA will remain  in effect (meaning this test can be used) for the duration of the COVID-19 declaration under Section 564(b)(1)  of the Act, 21 U.S.C.section 360bbb-3(b)(1), unless the authorization is terminated  or revoked sooner.       Influenza A by PCR NEGATIVE NEGATIVE Final   Influenza B by PCR NEGATIVE NEGATIVE Final    Comment: (NOTE) The Xpert Xpress SARS-CoV-2/FLU/RSV plus assay is intended as an aid in the diagnosis of influenza from Nasopharyngeal swab specimens and should not be used as a sole basis for treatment. Nasal washings and aspirates are unacceptable for Xpert Xpress SARS-CoV-2/FLU/RSV testing.  Fact Sheet for Patients: EntrepreneurPulse.com.au  Fact Sheet for Healthcare Providers: IncredibleEmployment.be  This test is not yet approved or cleared by the [place] FDA and has been authorized for detection and/or diagnosis of SARS-CoV-2 by FDA under an Emergency Use Authorization (EUA). This EUA will remain in effect (meaning this test can be used) for the duration of the COVID-19 declaration under Section 564(b)(1) of the Act, 21 U.S.C. section 360bbb-3(b)(1), unless the authorization is terminated or revoked.  Performed at [facility], [place] [address] [place], [place] [address]     RADIOLOGY STUDIES/RESULTS: CT HEAD WO CONTRAST (5MM)  Result Date: 05/12/2021 CLINICAL DATA:  Delirium EXAM: CT HEAD WITHOUT CONTRAST TECHNIQUE: Contiguous axial images were obtained from the base of the skull through the vertex without intravenous contrast. COMPARISON:  05/11/2021 FINDINGS: Brain: There is no mass, hemorrhage or extra-axial collection. The size and configuration of the ventricles and extra-axial CSF spaces are normal. 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 Mild findings of chronic small vessel disease. Vascular: No abnormal hyperdensity of the major intracranial arteries or dural venous sinuses. No intracranial atherosclerosis. Skull: The visualized skull base, calvarium and extracranial soft tissues are normal. Sinuses/Orbits:  No fluid levels or advanced mucosal thickening of the visualized paranasal sinuses. No mastoid or middle ear effusion. The orbits are normal. IMPRESSION: 1. No acute intracranial abnormality. 2. 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 Electronically Signed   By: Ulyses Jarred M.D.   On: 05/12/2021 03:56   CT HEAD WO CONTRAST  Result Date: 05/11/2021 CLINICAL DATA:  Transient ischemic attack (TIA) EXAM: CT HEAD WITHOUT CONTRAST TECHNIQUE: Contiguous axial images were obtained from the base of the skull through the vertex without intravenous contrast. COMPARISON:  CT head September 26, 2020. FINDINGS: Brain: No evidence of acute large vascular territory infarction, hemorrhage, hydrocephalus, extra-axial collection or mass lesion/mass effect. Similar remote infarcts in the right caudate, right cerebellum, left parieto-occipital cortex, and potentially the high right frontal white matter. Additional patchy white matter hypoattenuation, nonspecific but compatible with chronic microvascular ischemic disease. Vascular: No hyperdense vessel identified. Calcific intracranial atherosclerosis. Skull: No acute fracture. Sinuses/Orbits: Clear sinuses.  Unremarkable orbits. Other: No mastoid effusions. IMPRESSION: 1. No evidence of acute intracranial abnormality. 2. Similar remote infarcts and chronic microvascular ischemic disease. Electronically Signed   By: Margaretha Sheffield M.D.   On: 05/11/2021 18:46   MR ANGIO HEAD WO CONTRAST  Result Date: 05/11/2021 CLINICAL DATA:  TIA EXAM: MRA NECK WITHOUT CONTRAST MRA HEAD WITHOUT CONTRAST TECHNIQUE: Angiographic images of the Circle of Willis were acquired using MRA technique without intravenous contrast. COMPARISON:  None. FINDINGS: MRA NECK FINDINGS Time-of-flight imaging of the carotid and vertebral systems shows antegrade flow. There is narrowing of the right internal carotid artery is of approximately 50% by NASCET criteria.  The vertebral system is right dominant. MRA HEAD FINDINGS POSTERIOR CIRCULATION: --Vertebral arteries: Normal --Inferior cerebellar arteries: Normal. --Basilar artery: Normal. --Superior cerebellar arteries: Normal. --Posterior cerebral arteries: Normal. ANTERIOR CIRCULATION: --Intracranial internal carotid arteries: Normal. --Anterior cerebral arteries (ACA): Normal. --Middle cerebral arteries (MCA): Normal. ANATOMIC VARIANTS: Fetal origin of the left PCA. IMPRESSION: 1. No emergent large vessel occlusion or high-grade stenosis. 2. Approximately 50% stenosis of the right internal carotid artery. Electronically Signed   By: Ulyses Jarred M.D.   On: 05/11/2021 22:28   MR ANGIO NECK WO CONTRAST  Result Date: 05/11/2021 CLINICAL DATA:  TIA EXAM: MRA NECK WITHOUT CONTRAST MRA HEAD WITHOUT CONTRAST TECHNIQUE: Angiographic images of the Circle of Willis were acquired using MRA technique without intravenous contrast. COMPARISON:  None. FINDINGS: MRA NECK FINDINGS Time-of-flight imaging of the carotid and vertebral systems shows antegrade flow. There is narrowing of the right internal carotid artery is of approximately 50% by NASCET criteria. The vertebral system is right dominant. MRA HEAD FINDINGS POSTERIOR CIRCULATION: --Vertebral arteries: Normal --Inferior cerebellar arteries: Normal. --Basilar artery: Normal. --Superior cerebellar arteries: Normal. --Posterior cerebral arteries: Normal. ANTERIOR CIRCULATION: --Intracranial internal carotid arteries: Normal. --Anterior cerebral arteries (ACA): Normal. --Middle cerebral arteries (MCA): Normal. ANATOMIC VARIANTS: Fetal origin of the left PCA. IMPRESSION: 1. No emergent large vessel occlusion or high-grade stenosis. 2. Approximately 50% stenosis of the right internal carotid artery. Electronically Signed   By: Ulyses Jarred M.D.   On: 05/11/2021 22:28   MR BRAIN WO CONTRAST  Result Date: 05/12/2021 CLINICAL DATA:  Transient ischemic attack EXAM: MRI HEAD WITHOUT  CONTRAST TECHNIQUE: Multiplanar, multiecho pulse sequences of the brain and surrounding structures were obtained without intravenous contrast. COMPARISON:  None. FINDINGS: Examination degraded by motion. The examination was not run to completion due to patient altered mental status and inability to cooperate with the technologist. Diffusion-weighted imaging shows no acute infarct. No midline shift or other mass effect. IMPRESSION: Truncated and motion degraded examination. No acute infarct. Electronically Signed   By: Ulyses Jarred M.D.   On: 05/12/2021 02:53   EEG adult  Result Date: 05/12/2021 Lora Havens, MD     05/12/2021  7:14 AM Patient Name: LOMAN LOGAN MRN: [id number] Epilepsy Attending: Lora Havens Referring Physician/Provider: Dr Roland Rack Date: 05/12/2021 Duration: 40.28 mins Patient history: 80 year old male with a history of hypertension, hyperlipidemia, dementia, depression atrial fibrillation not on anticoagulation who presents with altered mental status. EEG to evaluate for seizure Level of alertness: Awake AEDs during EEG study: None Technical aspects: This EEG study was done with scalp electrodes positioned according to the 10-20 International system of electrode placement. Electrical activity was acquired at a sampling rate of 500Hz  and reviewed with a high frequency filter of 70Hz  and a low frequency filter of 1Hz . EEG data were recorded continuously and digitally stored. Description: No posterior dominant rhythm was seen. EEG showed continuous generalized 3 to 6 Hz theta-delta slowing with overriding 15-18hz  beta activity. Hyperventilation and photic stimulation were not performed.  Study was technically difficult due to significant myogenic and electrode artifact.  ABNORMALITY - Continuous slow, generalized IMPRESSION:  This technically difficult study is suggestive of moderate diffuse encephalopathy, nonspecific etiology. No seizures or epileptiform discharges were seen  throughout the recording. Priyanka Barbra Sarks     LOS: 0 days   Oren Binet, MD  [facility]    To contact the attending provider between 7A-7P or the covering provider during after hours 7P-7A, please log into the web site [email or web address] and access using universal [place] password for that web site. If you do not have the password, please call the hospital operator.  05/12/2021, 3:02 PM

## 2021-05-12 NOTE — ED Notes (Signed)
Patient transported to MRI 

## 2021-05-12 NOTE — Plan of Care (Signed)
Neurology plan of care  Patient seen and examined by my colleague Dr. Leonel Ramsay overnight. 80 year old male with a history of hypertension, hyperlipidemia, dementia, depression atrial fibrillation not on anticoagulation p/w AMS w/o new focal neurologic findings.  MRI not completed 2/2 claustrophobia but on motion-degraded diffusion sequences no acute infarct is appreciated. Suspect hypertensive emergency +/- PRES. SBP currently 150s-160s, would not actively decrease it lower than this today given that he presented yesterday with SBP 200s-220s. rEEG showed diffuse slowing w/o epileptiform abnl. Patient should have repeat MRI brain when mental status improves and is able to tolerate. If unable to obtain by tmrw consider MRI brain with sedation.  Updated recommendations: 1) Goal SBP today 150s-160s, PRN labetalol for SBP above target range 2) thiamine level f/b 500mg  tid x2 days  3) Repeat MRI brain wo contrast when able to do so. Will re-examine tmrw, if still not back to baseline and unable to obtain MRI brain by that time with consider MRI w/ sedation.  Please contact neurology with any questions. We will continue to follow.    Su Monks, MD [facility] [phone]  If 7pm- 7am, please page neurology on call as listed in [place].

## 2021-05-12 NOTE — Progress Notes (Signed)
CSW contacted [facility] and they will evaluate patient to determine if he is appropriate for residential hospice. [facility] will also reach out to the daughter. CSW was told it may not be [facility] due to them being full but they might have a bed at hospice home in [place].

## 2021-05-12 NOTE — ED Notes (Addendum)
EEG was setting up at bedside and stated that he was alert, talking and laughing then became fixed with his head turned to the right side and "frozen". We were called into the room and his head was fixed to the right, his hands clenched, tongue out and his eyes had a slight twitch. His eyes were not fixed and we were able to move his arms however they stuck where we left them. Th EEG machine was not running at the time   MD made aware

## 2021-05-12 NOTE — ED Notes (Signed)
Vital signs to be done daily per palliative care. Vital sign monitoring equipment removed and monitor turned off at this time.

## 2021-05-12 NOTE — ED Notes (Signed)
Pt returned from MRI with uncompleted scans due to agitation and combativeness. Upon arrival to room, pt was noticeably more agitated and confused compared to prior assessments. MD notified and came to bedside for further assessment. Family at bedside with stretcher in lowest and locked position.

## 2021-05-12 NOTE — Progress Notes (Addendum)
[facility]) Hospital Liaison Note  Received request from [facility] manager(Shannon) for family interest in [facility] with request for transfer. Chart reviewed and eligibility approved.  Met with [name] (daughter) to confirm interest and explain services. Unfortunately, [facility] is not able to offer a bed today. [facility] hospital liaison will follow up tomorrow or sooner if bed becomes available.   [facility] hospital liaison will continue to follow. Thank you. Please call with any questions or concerns.   Zollie Pee [facility] Liaison  [phone]

## 2021-05-12 NOTE — ED Notes (Signed)
Transport taking pt upstairs to assigned inpatient room. Since vital signs are to be done only daily for comfort care per palliative care, vital signs not obtained prior to transport.
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
## 2021-05-12 NOTE — Procedures (Signed)
Patient Name: Troy [name]  MRN: [id number]  Epilepsy Attending: Lora Havens  Referring Physician/Provider: Dr Roland Rack Date: 05/12/2021 Duration: 40.28 mins  Patient history: 80 year old male with a history of hypertension, hyperlipidemia, dementia, depression atrial fibrillation not on anticoagulation who presents with altered mental status. EEG to evaluate for seizure  Level of alertness: Awake  AEDs during EEG study: None  Technical aspects: This EEG study was done with scalp electrodes positioned according to the 10-20 International system of electrode placement. Electrical activity was acquired at a sampling rate of 500Hz  and reviewed with a high frequency filter of 70Hz  and a low frequency filter of 1Hz . EEG data were recorded continuously and digitally stored.   Description: No posterior dominant rhythm was seen. EEG showed continuous generalized 3 to 6 Hz theta-delta slowing with overriding 15-18hz  beta activity. Hyperventilation and photic stimulation were not performed.    Study was technically difficult due to significant myogenic and electrode artifact.    ABNORMALITY - Continuous slow, generalized  IMPRESSION: This technically difficult study is suggestive of moderate diffuse encephalopathy, nonspecific etiology. No seizures or epileptiform discharges were seen throughout the recording.  Troy [name] Troy [name]

## 2021-05-12 NOTE — Progress Notes (Signed)
EEG complete - results pending 

## 2021-05-12 NOTE — Progress Notes (Signed)
OT Cancellation Note  Patient Details Name: Troy [name] MRN: [id number] DOB: 17-Apr-1941   Cancelled Treatment:    Reason Eval/Treat Not Completed: Medical issues which prohibited therapy.  Patient very restless, not following commands and agitated.  OT to continue efforts as appropriate.    Derriona Branscom D Coralie Stanke 05/12/2021, 10:21 AM

## 2021-05-12 NOTE — Progress Notes (Signed)
Responded to referral from social worker to visit with  patient per daughter request.  Patient is actively passing. Provided prayer, presence,active listening ,spiritual and emotional support.  Chaplain available as needed.  Jaclynn Major, [place], [facility], Pager [phone]

## 2021-05-13 NOTE — TOC Transition Note (Signed)
Transition of Care [facility]) - CM/SW Discharge Note   Patient Details  Name: BEATRICE SEHGAL MRN: [id number] Date of Birth: Oct 05, 1940  Transition of Care [facility]) CM/SW Contact:  Bartholomew Crews, RN Phone Number: [phone] 05/13/2021, 12:45 PM   Clinical Narrative:     Notified by hospice of readiness for patient transportation to be arranged. PTAR notified. Patient's daughter, [name], notified. No further TOC needs identified at this time.   Final next level of care: [place] Barriers to Discharge: No Barriers Identified   Patient Goals and CMS Choice Patient states their goals for this hospitalization and ongoing recovery are:: inpatient hospice CMS Medicare.gov Compare Post Acute Care list provided to:: Patient Represenative (must comment) (daughter, [name])    Discharge Placement                       Discharge Plan and Services                                     Social Determinants of Health (SDOH) Interventions     Readmission Risk Interventions Readmission Risk Prevention Plan 09/29/2020  Transportation Screening Complete  PCP or Specialist Appt within 5-7 Days Complete  Home Care Screening Complete  Medication Review (RN CM) Complete  Some recent data might be hidden

## 2021-05-13 NOTE — Progress Notes (Signed)
Manufacturing engineer [facility])  [place] has a bed for Mr. Monday today.  All necessary paperwork has been completed and transportation can be arranged.  RN staff, please call report to [phone] at any time.  Please update [name] with ETA of ambulance transport.   Thank you, Venia Carbon RN, BSN [facility] Liaison

## 2021-05-13 NOTE — Discharge Summary (Signed)
Troy [name] OBS:[id number] DOB: 09/10/1940 DOA: 05/11/2021  PCP: Pcp, No  Admit date: 05/11/2021  Discharge date: 05/13/2021  Admitted From: Home   Disposition:  Residential Hospice   Recommendations for Outpatient Follow-up:   Follow up with PCP in 1-2 weeks  PCP Please obtain BMP/CBC, 2 view CXR in 1week,  (see Discharge instructions)   PCP Please follow up on the following pending results:    Home Health: None   Equipment/Devices: None  Consultations: Renal, Pall.Care Discharge Condition: Stable    CODE STATUS: DNR   Diet Recommendation: Soft for comfort only  Diet Order             Diet regular Room service appropriate? Yes; Fluid consistency: Thin  Diet effective now                    CC - SOB   Brief history of present illness from the day of admission and additional interim summary     Patient is a 80 y.o. male with history of ESRD on HD, HTN, HFrEF, dementia, hypothyroidism, PAF no longer on anticoagulation due to recurrent falls-presenting with altered mental status dysarthria.  See below for further details.     Significant events: 11/3>> admit for confusion-uncontrolled hypertension. 11/4>> nephrology discussed with family-given significant decline in quality of life/function-transitioned to comfort measures. No further Hemodialysis planned.                                                                 Hospital Course     Acute metabolic encephalopathy superimposed on dementia: Remains very confused/agitated-MRI brain negative for CVA-did not show any changes consistent with PR ES (however motion degraded study).  Etiology unclear-doubt benefit from further work-up given that he has now been transitioned to full comfort measures->> residential Hospice.   Hypertensive urgency: All  antihypertensives discontinued now that patient has been transitioned to comfort measures.   PAF: Not anticoagulate due to history of recurrent falls-all rate control medications discontinued as patient has been transitioned to full comfort measures.   Hyperkalemia: No further dialysis planned.  Comfort measures in effect.   ESRD: Nephrology following-no further plans for HD-has been transitioned to comfort measures.   HFrEF: Volume status stable.   CAD: No anginal symptoms  Discharge diagnosis     Principal Problem:   Transient neurologic deficit Active Problems:   ESRD (end stage renal disease) (Section)   History of stroke   Depression with anxiety   Paroxysmal A-fib (HCC)   Ischemic cardiomyopathy   Hypertensive urgency   Hypothyroidism   Altered mental state    Discharge instructions    Discharge Instructions     Discharge instructions   Complete by: As directed    Disposition.  Residential hospice Condition.  Guarded CODE STATUS.  DNR Activity.  With assistance as tolerated, full fall precautions. Diet.  Soft with feeding assistance and aspiration precautions. Goal of care.  Comfort.       Discharge Medications   Allergies as of 05/13/2021       Reactions   Penicillin G Anaphylaxis, Other (See Comments)   Syncope, also   Penicillins Anaphylaxis, Other (See Comments)   Passed out and Syncope Did it involve swelling of the face/tongue/throat, SOB, or low BP? Yes Did it involve sudden or severe rash/hives, skin peeling, or any reaction on the inside of your mouth or nose? No Did you need to seek medical attention at a hospital or doctor's office? Yes When did it last happen? Unk If all above answers are "NO", may proceed with cephalosporin use.   Tetanus Immune Globulin Anaphylaxis   Tetanus Toxoid, Adsorbed Anaphylaxis   Tetanus Toxoids Anaphylaxis   Sulfa Antibiotics Nausea And Vomiting, Other (See Comments)   Violent reaction        Medication List      STOP taking these medications    allopurinol 100 MG tablet Commonly known as: ZYLOPRIM   amiodarone 200 MG tablet Commonly known as: PACERONE   ascorbic acid 500 MG tablet Commonly known as: VITAMIN C   atorvastatin 40 MG tablet Commonly known as: LIPITOR   clopidogrel 75 MG tablet Commonly known as: PLAVIX   ethyl chloride spray   feeding supplement (NEPRO CARB STEADY) Liqd   Fish Oil 1000 MG Caps   PRESCRIPTION MEDICATION   QUEtiapine 25 MG tablet Commonly known as: SEROQUEL   sertraline 100 MG tablet Commonly known as: ZOLOFT   VANCOMYCIN HCL PO   Velphoro 500 MG chewable tablet Generic drug: sucroferric oxyhydroxide   VITAMIN B-12 PO   zinc gluconate 50 MG tablet       TAKE these medications    acetaminophen 500 MG tablet Commonly known as: TYLENOL Take 500 mg by mouth every 6 (six) hours as needed for moderate pain or mild pain.   aspirin 81 MG EC tablet Take 1 tablet (81 mg total) by mouth daily.   hydrocortisone cream 1 % Apply 1 application topically 2 (two) times daily as needed for itching (or yeast).   isosorbide mononitrate 30 MG 24 hr tablet Commonly known as: IMDUR Take 1 tablet (30 mg total) by mouth daily. NEED OV.   levothyroxine 75 MCG tablet Commonly known as: SYNTHROID Take 1 tablet (75 mcg total) by mouth daily before breakfast.   lidocaine-prilocaine cream Commonly known as: EMLA Apply topically every Monday, Wednesday, and Friday.   Melatonin 10 MG Tabs Take 10 mg by mouth at bedtime.   midodrine 10 MG tablet Commonly known as: PROAMATINE Take 5 mg by mouth See admin instructions. Take 5 mg by mouth in the morning only on Mon/Wed/Fri- before dialysis   nitroGLYCERIN 0.4 MG SL tablet Commonly known as: NITROSTAT PLACE 1 TABLET UNDER THE TONGUE EVERY 5 MINUTES AS NEEDED FOR CHEST PAIN. MAX OF 3 DOSES   One Daily For Men 50+ Advanced Tabs Take 1 tablet by mouth daily with breakfast.   polyethylene glycol 17 g  packet Commonly known as: MIRALAX / GLYCOLAX Take 17 g by mouth daily as needed for mild constipation (MIX AND DRINK).          Major procedures and Radiology Reports - PLEASE review detailed and final reports thoroughly  -         DG Ribs Unilateral W/Chest Left  Result Date: 05/09/2021  CLINICAL DATA:  Left rib pain status post fall EXAM: LEFT RIBS AND CHEST - 3+ VIEW COMPARISON:  None. FINDINGS: No acute displaced fracture or other bone lesions are seen involving the left ribs. Old healed right rib fractures. The heart and mediastinal contours are unchanged. Aortic calcification. Biapical pleural/pulmonary scarring. No focal consolidation. No pulmonary edema. No pleural effusion. No pneumothorax. No acute osseous abnormality. IMPRESSION: 1. No acute displaced left rib fracture. Please note, nondisplaced rib fractures may be occult on radiograph. 2. No acute cardiopulmonary abnormality. 3.  Aortic Atherosclerosis (ICD10-I70.0). Electronically Signed   By: Iven Finn M.D.   On: 05/09/2021 16:50   CT HEAD WO CONTRAST (5MM)  Result Date: 05/12/2021 CLINICAL DATA:  Delirium EXAM: CT HEAD WITHOUT CONTRAST TECHNIQUE: Contiguous axial images were obtained from the base of the skull through the vertex without intravenous contrast. COMPARISON:  05/11/2021 FINDINGS: Brain: There is no mass, hemorrhage or extra-axial collection. The size and configuration of the ventricles and extra-axial CSF spaces are normal. 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 Mild findings of chronic small vessel disease. Vascular: No abnormal hyperdensity of the major intracranial arteries or dural venous sinuses. No intracranial atherosclerosis. Skull: The visualized skull base, calvarium and extracranial soft tissues are normal. Sinuses/Orbits: No fluid levels or advanced mucosal thickening of the visualized paranasal sinuses. No mastoid or middle ear effusion. The orbits are normal. IMPRESSION: 1. No acute  intracranial abnormality. 2. 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 Electronically Signed   By: Ulyses Jarred M.D.   On: 05/12/2021 03:56   CT HEAD WO CONTRAST  Result Date: 05/11/2021 CLINICAL DATA:  Transient ischemic attack (TIA) EXAM: CT HEAD WITHOUT CONTRAST TECHNIQUE: Contiguous axial images were obtained from the base of the skull through the vertex without intravenous contrast. COMPARISON:  CT head September 26, 2020. FINDINGS: Brain: No evidence of acute large vascular territory infarction, hemorrhage, hydrocephalus, extra-axial collection or mass lesion/mass effect. Similar remote infarcts in the right caudate, right cerebellum, left parieto-occipital cortex, and potentially the high right frontal white matter. Additional patchy white matter hypoattenuation, nonspecific but compatible with chronic microvascular ischemic disease. Vascular: No hyperdense vessel identified. Calcific intracranial atherosclerosis. Skull: No acute fracture. Sinuses/Orbits: Clear sinuses.  Unremarkable orbits. Other: No mastoid effusions. IMPRESSION: 1. No evidence of acute intracranial abnormality. 2. Similar remote infarcts and chronic microvascular ischemic disease. Electronically Signed   By: Margaretha Sheffield M.D.   On: 05/11/2021 18:46   MR ANGIO HEAD WO CONTRAST  Result Date: 05/11/2021 CLINICAL DATA:  TIA EXAM: MRA NECK WITHOUT CONTRAST MRA HEAD WITHOUT CONTRAST TECHNIQUE: Angiographic images of the Circle of Willis were acquired using MRA technique without intravenous contrast. COMPARISON:  None. FINDINGS: MRA NECK FINDINGS Time-of-flight imaging of the carotid and vertebral systems shows antegrade flow. There is narrowing of the right internal carotid artery is of approximately 50% by NASCET criteria. The vertebral system is right dominant. MRA HEAD FINDINGS POSTERIOR CIRCULATION: --Vertebral arteries: Normal --Inferior cerebellar arteries: Normal. --Basilar  artery: Normal. --Superior cerebellar arteries: Normal. --Posterior cerebral arteries: Normal. ANTERIOR CIRCULATION: --Intracranial internal carotid arteries: Normal. --Anterior cerebral arteries (ACA): Normal. --Middle cerebral arteries (MCA): Normal. ANATOMIC VARIANTS: Fetal origin of the left PCA. IMPRESSION: 1. No emergent large vessel occlusion or high-grade stenosis. 2. Approximately 50% stenosis of the right internal carotid artery. Electronically Signed   By: Ulyses Jarred M.D.   On: 05/11/2021 22:28   MR ANGIO NECK WO CONTRAST  Result  Date: 05/11/2021 CLINICAL DATA:  TIA EXAM: MRA NECK WITHOUT CONTRAST MRA HEAD WITHOUT CONTRAST TECHNIQUE: Angiographic images of the Circle of Willis were acquired using MRA technique without intravenous contrast. COMPARISON:  None. FINDINGS: MRA NECK FINDINGS Time-of-flight imaging of the carotid and vertebral systems shows antegrade flow. There is narrowing of the right internal carotid artery is of approximately 50% by NASCET criteria. The vertebral system is right dominant. MRA HEAD FINDINGS POSTERIOR CIRCULATION: --Vertebral arteries: Normal --Inferior cerebellar arteries: Normal. --Basilar artery: Normal. --Superior cerebellar arteries: Normal. --Posterior cerebral arteries: Normal. ANTERIOR CIRCULATION: --Intracranial internal carotid arteries: Normal. --Anterior cerebral arteries (ACA): Normal. --Middle cerebral arteries (MCA): Normal. ANATOMIC VARIANTS: Fetal origin of the left PCA. IMPRESSION: 1. No emergent large vessel occlusion or high-grade stenosis. 2. Approximately 50% stenosis of the right internal carotid artery. Electronically Signed   By: Ulyses Jarred M.D.   On: 05/11/2021 22:28   MR BRAIN WO CONTRAST  Result Date: 05/12/2021 CLINICAL DATA:  Transient ischemic attack EXAM: MRI HEAD WITHOUT CONTRAST TECHNIQUE: Multiplanar, multiecho pulse sequences of the brain and surrounding structures were obtained without intravenous contrast. COMPARISON:  None.  FINDINGS: Examination degraded by motion. The examination was not run to completion due to patient altered mental status and inability to cooperate with the technologist. Diffusion-weighted imaging shows no acute infarct. No midline shift or other mass effect. IMPRESSION: Truncated and motion degraded examination. No acute infarct. Electronically Signed   By: Ulyses Jarred M.D.   On: 05/12/2021 02:53   EEG adult  Result Date: 05/12/2021 Lora Havens, MD     05/12/2021  7:14 AM Patient Name: Troy [name] MRN: [id number] Epilepsy Attending: Lora Havens Referring Physician/Provider: Dr Roland Rack Date: 05/12/2021 Duration: 40.28 mins Patient history: 80 year old male with a history of hypertension, hyperlipidemia, dementia, depression atrial fibrillation not on anticoagulation who presents with altered mental status. EEG to evaluate for seizure Level of alertness: Awake AEDs during EEG study: None Technical aspects: This EEG study was done with scalp electrodes positioned according to the 10-20 International system of electrode placement. Electrical activity was acquired at a sampling rate of 500Hz  and reviewed with a high frequency filter of 70Hz  and a low frequency filter of 1Hz . EEG data were recorded continuously and digitally stored. Description: No posterior dominant rhythm was seen. EEG showed continuous generalized 3 to 6 Hz theta-delta slowing with overriding 15-18hz  beta activity. Hyperventilation and photic stimulation were not performed.  Study was technically difficult due to significant myogenic and electrode artifact.  ABNORMALITY - Continuous slow, generalized IMPRESSION: This technically difficult study is suggestive of moderate diffuse encephalopathy, nonspecific etiology. No seizures or epileptiform discharges were seen throughout the recording. Priyanka Barbra Sarks     Today   Subjective    [place] today in bed, unresponsive, in no distress   Objective   Blood  pressure (!) 181/168, pulse (!) 55, temperature 98.7 F (37.1 C), temperature source Oral, resp. rate 16, SpO2 95 %.  No intake or output data in the 24 hours ending 05/13/21 0955  Exam  Somnolent but comfortable [place].AT,PERRAL Supple Neck,   Symmetrical Chest wall movement, Good air movement bilaterally, CTAB RRR,No Gallops,Rubs or new Murmurs,   +ve B.Sounds, Abd Soft, Non tender,  No Cyanosis, Clubbing or edema,      Data Review   CBC w Diff:  Lab Results  Component Value Date   WBC 11.6 (H) 05/12/2021   HGB 11.9 (L) 05/12/2021   HGB 11.1 (L) 06/24/2018   HCT  36.6 (L) 05/12/2021   HCT 31.7 (L) 06/24/2018   PLT 240 05/12/2021   PLT 301 06/24/2018   LYMPHOPCT 7 09/26/2020   MONOPCT 7 09/26/2020   EOSPCT 1 09/26/2020   BASOPCT 1 09/26/2020    CMP:  Lab Results  Component Value Date   NA 138 05/11/2021   K 5.4 (H) 05/11/2021   CL 96 (L) 05/11/2021   CO2 29 05/11/2021   BUN 28 (H) 05/11/2021   CREATININE 6.34 (H) 05/11/2021   CREATININE 4.32 (H) 06/14/2016   PROT 7.1 05/11/2021   PROT 6.8 06/24/2018   ALBUMIN 3.3 (L) 05/11/2021   ALBUMIN 3.9 06/24/2018   BILITOT 0.7 05/11/2021   BILITOT 0.5 06/24/2018   ALKPHOS 96 05/11/2021   AST 25 05/11/2021   ALT 18 05/11/2021  .   Total Time in preparing paper work, data evaluation and todays exam - 104 minutes  Lala Lund M.D on 05/13/2021 at 9:55 AM  [facility]

## 2021-05-13 NOTE — Discharge Instructions (Signed)
Disposition.  Residential hospice °Condition.  Guarded °CODE STATUS.  DNR °Activity.  With assistance as tolerated, full fall precautions. °Diet.  Soft with feeding assistance and aspiration precautions. °Goal of care.  Comfort. ° °

## 2021-05-13 NOTE — Plan of Care (Signed)
  Problem: Pain Managment: Goal: General experience of comfort will improve Outcome: Progressing   

## 2021-05-13 NOTE — Plan of Care (Signed)
  Problem: Pain Managment: Goal: General experience of comfort will improve 05/13/2021 1508 by Samule Dry A, RN Outcome: Progressing 05/13/2021 1506 by Samule Dry A, RN Outcome: Progressing
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
## 2021-05-16 LAB — VITAMIN B1: Vitamin B1 (Thiamine): 336.4 nmol/L — ABNORMAL HIGH (ref 66.5–200.0)

## 2021-06-08 DEATH — deceased
